# Patient Record
Sex: Female | Born: 1935
Health system: Southern US, Community
[De-identification: ages and names within clinical notes are randomized; demographics above are authoritative.]

## PROBLEM LIST (undated history)

## (undated) DIAGNOSIS — I639 Cerebral infarction, unspecified: Secondary | ICD-10-CM

## (undated) DIAGNOSIS — G609 Hereditary and idiopathic neuropathy, unspecified: Secondary | ICD-10-CM

## (undated) DIAGNOSIS — K219 Gastro-esophageal reflux disease without esophagitis: Secondary | ICD-10-CM

## (undated) DIAGNOSIS — F32A Depression, unspecified: Secondary | ICD-10-CM

## (undated) DIAGNOSIS — M1990 Unspecified osteoarthritis, unspecified site: Secondary | ICD-10-CM

## (undated) DIAGNOSIS — F329 Major depressive disorder, single episode, unspecified: Secondary | ICD-10-CM

## (undated) DIAGNOSIS — G4762 Sleep related leg cramps: Secondary | ICD-10-CM

## (undated) DIAGNOSIS — I471 Supraventricular tachycardia: Secondary | ICD-10-CM

## (undated) DIAGNOSIS — E785 Hyperlipidemia, unspecified: Secondary | ICD-10-CM

## (undated) DIAGNOSIS — E079 Disorder of thyroid, unspecified: Secondary | ICD-10-CM

## (undated) DIAGNOSIS — H9190 Unspecified hearing loss, unspecified ear: Secondary | ICD-10-CM

## (undated) HISTORY — DX: Unspecified hearing loss, unspecified ear: H91.90

## (undated) HISTORY — PX: SALPINGECTOMY: SHX328

## (undated) HISTORY — PX: APPENDECTOMY: SHX54

## (undated) HISTORY — DX: Disorder of thyroid, unspecified: E07.9

## (undated) HISTORY — DX: Hereditary and idiopathic neuropathy, unspecified: G60.9

## (undated) HISTORY — PX: BUNIONECTOMY: SHX129

## (undated) HISTORY — PX: NASAL SINUS SURGERY: SHX719

## (undated) HISTORY — PX: EYE SURGERY: SHX253

## (undated) HISTORY — PX: OOPHORECTOMY: SHX86

## (undated) HISTORY — PX: ROTATOR CUFF REPAIR: SHX139

## (undated) HISTORY — PX: BLADDER SURGERY: SHX569

## (undated) HISTORY — PX: BACK SURGERY: SHX140

## (undated) HISTORY — PX: FOOT SURGERY: SHX648

## (undated) HISTORY — DX: Sleep related leg cramps: G47.62

---

## 1997-10-07 ENCOUNTER — Ambulatory Visit (HOSPITAL_COMMUNITY): Admission: RE | Admit: 1997-10-07 | Discharge: 1997-10-07 | Payer: Self-pay | Admitting: Family Medicine

## 1999-02-21 ENCOUNTER — Ambulatory Visit (HOSPITAL_BASED_OUTPATIENT_CLINIC_OR_DEPARTMENT_OTHER): Admission: RE | Admit: 1999-02-21 | Discharge: 1999-02-21 | Payer: Self-pay | Admitting: Orthopedic Surgery

## 1999-02-28 ENCOUNTER — Encounter: Admission: RE | Admit: 1999-02-28 | Discharge: 1999-04-24 | Payer: Self-pay | Admitting: Orthopedic Surgery

## 2001-10-20 ENCOUNTER — Ambulatory Visit (HOSPITAL_COMMUNITY): Admission: RE | Admit: 2001-10-20 | Discharge: 2001-10-20 | Payer: Self-pay | Admitting: Gastroenterology

## 2001-10-30 ENCOUNTER — Encounter: Payer: Self-pay | Admitting: Family Medicine

## 2001-10-30 ENCOUNTER — Encounter: Admission: RE | Admit: 2001-10-30 | Discharge: 2001-10-30 | Payer: Self-pay | Admitting: Family Medicine

## 2003-04-14 ENCOUNTER — Ambulatory Visit (HOSPITAL_COMMUNITY): Admission: RE | Admit: 2003-04-14 | Discharge: 2003-04-14 | Payer: Self-pay | Admitting: Family Medicine

## 2004-05-04 ENCOUNTER — Encounter: Admission: RE | Admit: 2004-05-04 | Discharge: 2004-06-02 | Payer: Self-pay | Admitting: Orthopedic Surgery

## 2005-01-17 ENCOUNTER — Encounter: Admission: RE | Admit: 2005-01-17 | Discharge: 2005-01-17 | Payer: Self-pay | Admitting: Orthopedic Surgery

## 2005-04-17 ENCOUNTER — Ambulatory Visit (HOSPITAL_COMMUNITY): Admission: RE | Admit: 2005-04-17 | Discharge: 2005-04-17 | Payer: Self-pay | Admitting: Interventional Cardiology

## 2007-04-22 ENCOUNTER — Inpatient Hospital Stay (HOSPITAL_COMMUNITY): Admission: EM | Admit: 2007-04-22 | Discharge: 2007-04-23 | Payer: Self-pay | Admitting: Emergency Medicine

## 2007-07-22 ENCOUNTER — Ambulatory Visit (HOSPITAL_BASED_OUTPATIENT_CLINIC_OR_DEPARTMENT_OTHER): Admission: RE | Admit: 2007-07-22 | Discharge: 2007-07-22 | Payer: Self-pay | Admitting: Orthopedic Surgery

## 2007-08-06 ENCOUNTER — Encounter: Admission: RE | Admit: 2007-08-06 | Discharge: 2007-08-06 | Payer: Self-pay | Admitting: Orthopedic Surgery

## 2009-03-27 ENCOUNTER — Emergency Department (HOSPITAL_COMMUNITY): Admission: EM | Admit: 2009-03-27 | Discharge: 2009-03-27 | Payer: Self-pay | Admitting: Emergency Medicine

## 2009-04-04 ENCOUNTER — Ambulatory Visit (HOSPITAL_BASED_OUTPATIENT_CLINIC_OR_DEPARTMENT_OTHER): Admission: RE | Admit: 2009-04-04 | Discharge: 2009-04-05 | Payer: Self-pay | Admitting: Orthopedic Surgery

## 2009-06-09 ENCOUNTER — Encounter: Admission: RE | Admit: 2009-06-09 | Discharge: 2009-06-09 | Payer: Self-pay | Admitting: Orthopedic Surgery

## 2009-06-17 ENCOUNTER — Ambulatory Visit (HOSPITAL_BASED_OUTPATIENT_CLINIC_OR_DEPARTMENT_OTHER): Admission: RE | Admit: 2009-06-17 | Discharge: 2009-06-18 | Payer: Self-pay | Admitting: Orthopedic Surgery

## 2009-08-17 ENCOUNTER — Emergency Department (HOSPITAL_COMMUNITY): Admission: EM | Admit: 2009-08-17 | Discharge: 2009-08-17 | Payer: Self-pay | Admitting: Emergency Medicine

## 2009-08-31 ENCOUNTER — Encounter: Admission: RE | Admit: 2009-08-31 | Discharge: 2009-08-31 | Payer: Self-pay | Admitting: Family Medicine

## 2009-09-09 ENCOUNTER — Encounter: Admission: RE | Admit: 2009-09-09 | Discharge: 2009-09-27 | Payer: Self-pay | Admitting: Orthopedic Surgery

## 2009-09-27 ENCOUNTER — Encounter: Admission: RE | Admit: 2009-09-27 | Discharge: 2009-09-27 | Payer: Self-pay | Admitting: Internal Medicine

## 2009-09-27 ENCOUNTER — Other Ambulatory Visit: Admission: RE | Admit: 2009-09-27 | Discharge: 2009-09-27 | Payer: Self-pay | Admitting: Diagnostic Radiology

## 2009-10-26 ENCOUNTER — Encounter: Admission: RE | Admit: 2009-10-26 | Discharge: 2009-10-26 | Payer: Self-pay | Admitting: Orthopedic Surgery

## 2010-05-28 LAB — POCT HEMOGLOBIN-HEMACUE: Hemoglobin: 14.5 g/dL (ref 12.0–15.0)

## 2010-05-28 LAB — BASIC METABOLIC PANEL
BUN: 15 mg/dL (ref 6–23)
Creatinine, Ser: 0.63 mg/dL (ref 0.4–1.2)
GFR calc non Af Amer: >60 mL/min (ref >60)
Glucose, Bld: 93 mg/dL (ref 70–99)
Potassium: 4 mEq/L (ref 3.5–5.1)

## 2010-05-28 LAB — PROTIME-INR
INR: 0.96 (ref 0.00–1.49)
Prothrombin Time: 12.7 seconds (ref 11.6–15.2)

## 2010-05-29 LAB — DIFFERENTIAL
Basophils Absolute: 0 10*3/uL (ref 0.0–0.1)
Basophils Relative: 0 % (ref 0–1)
Lymphocytes Relative: 10 % — ABNORMAL LOW (ref 12–46)
Monocytes Absolute: 0.5 10*3/uL (ref 0.1–1.0)
Neutro Abs: 7.6 10*3/uL (ref 1.7–7.7)
Neutrophils Relative %: 84 % — ABNORMAL HIGH (ref 43–77)

## 2010-05-29 LAB — PROTIME-INR
INR: 2.26 — ABNORMAL HIGH (ref 0.00–1.49)
Prothrombin Time: 24.8 seconds — ABNORMAL HIGH (ref 11.6–15.2)

## 2010-05-29 LAB — POCT I-STAT, CHEM 8
Creatinine, Ser: 0.7 mg/dL (ref 0.4–1.2)
Hemoglobin: 13.6 g/dL (ref 12.0–15.0)
Sodium: 139 mEq/L (ref 135–145)
TCO2: 27 mmol/L (ref 0–100)

## 2010-05-29 LAB — POCT CARDIAC MARKERS
CKMB, poc: <1 ng/mL — ABNORMAL LOW (ref 1.0–8.0)
Myoglobin, poc: 63.9 ng/mL (ref 12–200)

## 2010-05-29 LAB — CBC
Hemoglobin: 13.1 g/dL (ref 12.0–15.0)
Platelets: 219 10*3/uL (ref 150–400)
RDW: 13.8 % (ref 11.5–15.5)
WBC: 9 10*3/uL (ref 4.0–10.5)

## 2010-05-31 LAB — BASIC METABOLIC PANEL
CO2: 27 mEq/L (ref 19–32)
Chloride: 108 mEq/L (ref 96–112)
GFR calc Af Amer: >60 mL/min (ref >60)
GFR calc non Af Amer: >60 mL/min (ref >60)
Glucose, Bld: 87 mg/dL (ref 70–99)
Potassium: 3.7 mEq/L (ref 3.5–5.1)
Sodium: 140 mEq/L (ref 135–145)

## 2010-05-31 LAB — PROTIME-INR
INR: 0.92 (ref 0.00–1.49)
Prothrombin Time: 12.3 seconds (ref 11.6–15.2)

## 2010-07-25 NOTE — Op Note (Signed)
NAME:  Ann Rodriguez, Ann Rodriguez                 ACCOUNT NO.:  [id number]   MEDICAL RECORD NO.:  [id number]          PATIENT TYPE:  AMB   LOCATION:  [facility]                         FACILITY:  [facility]   PHYSICIAN:  Marlowe Kays, M.D.  DATE OF BIRTH:  [birth year]   DATE OF PROCEDURE:  07/22/2007  DATE OF DISCHARGE:                               OPERATIVE REPORT   PREOPERATIVE DIAGNOSES.:  1. Torn medial meniscus.  2. Osteoarthritis, left knee.   POSTOPERATIVE DIAGNOSES:  1. Torn medial meniscus.  2. Osteoarthritis, left knee.   OPERATION:  Left knee arthroscopy with (1) partial medial meniscectomy,  (2 ) debridement of medial femoral condyle.   SURGEON:  Marlowe Kays, M.D.   ASSISTANT:  Nurse.   ANESTHESIA:  General.   JUSTIFICATION FOR PROCEDURE:  Painful left knee.  She had an MRI on  March 29, 2007 demonstrating the above diagnoses.  See operative  description below for additional details.   PROCEDURE:  After satisfactory general anesthesia, Ace wrap and knee  support to right lower extremity, pneumatic tourniquet to left lower  extremity, with left leg Esmarched out nonsterilely.  Thigh stabilizer  applied and the leg was prepped from stabilizer to ankle with DuraPrep  and draped in a sterile field.   Superior and medial saline inflow.  First through an anteromedial  portal, lateral compartment and knee joint was evaluated.  She had a  small amount of synovitis which I resected with a 3.5 shaver to allow  better visualization.  Lateral meniscus was intact to probing.  She had  some minimal wear of the lateral femoral condyle.  Looking up at the  suprapatellar area, the MRI had indicated grade 4 chondromalacia of the  patella, and this was confirmed visually.  A representative picture was  taken.  There was nothing that was arthroscopically shaveable.  I then  reversed portals.  Medially, she had a small amount of synovitis once  again which I resected.  She had wear of the medial  femoral condyle  which on debridement actually went down to bare bone, and I smoothed off  the surrounding perimeter surrounding the bare bone.  She also had  extensive tear going from the posterior curve all the way into the  intercondylar area which I resected back to a stable rim with a  combination of baskets and a 3.5 shaver.  Final pictures were taken.  The knee joint was irrigated until clear and all fluid possible removed.  I closed the 2 anterior portals with 4-0 nylon and injected through the  inflow apparatus 20 mL 0.5% Marcaine with adrenaline and 4 mg of  morphine.  I then closed this portal with 4-0 nylon as well.  Betadine  and Adaptic dry sterile dressing were applied.  Tourniquet was released.   She tolerated the procedure well and was taken to the recovery room in  satisfactory condition, with no known complications.           ______________________________  Marlowe Kays, M.D.     JA/MEDQ  D:  07/22/2007  T:  07/22/2007  Job:  [id number]

## 2010-07-25 NOTE — Discharge Summary (Signed)
NAME:  Ann Rodriguez, Ann Rodriguez NO.:  [id number]   MEDICAL RECORD NO.:  [id number]          PATIENT TYPE:  INP   LOCATION:  3704                         FACILITY:  [facility]   PHYSICIAN:  Lyn Records, M.D.   DATE OF BIRTH:  02/16/1929   DATE OF ADMISSION:  04/22/2007  DATE OF DISCHARGE:  04/23/2007                               DISCHARGE SUMMARY   DISCHARGE DIAGNOSES:  1. Premature supraventricular tachycardia, resolved.  2. History of paroxysmal atrial fibrillation, on flecainide.  3. Dyslipidemia.  4. Long-term Coumadin use.   [name] is a 77 year old female with a history of paroxysmal atrial  fibrillation.  On the day of admission she awoke with rapid heart rate  and weakness.  She took an extra flecainide but the arrhythmia  persisted, she ended up being admitted on April 22, 2007, and her  flecainide dose was up titrated.  She remained in the hospital over  night, her electrocardiogram remained normal and she was discharged to  home.   DISCHARGE MEDICATIONS:  1. Flecainide 100 mg twice a day.  2. Atenolol 50 mg 1-1/2 tablet a day.  3. Lovastatin 40 mg a day.  4. Coumadin 5 mg, 1-1/2 tablets a day as prior to admission.   FOLLOWUP APPOINTMENTS:  1. Coumadin checked as well as electrocardiogram in a visit with Tillman Sers, nurse practitioner, on April 28, 2007, at 2:15 p.m.  2. Followup with Dr. Katrinka Blazing on May 12, 2007, at 1:25 p.m.   Remain on a low-sodium, heart-healthy diet, increase activity slowly.  Call for any further palpitations.   LAB STUDIES:  Hemoglobin __________36.6, white count 6.1, platelets  251,000.  Sodium 142, potassium 4.1, BUN 12, creatinine 0.67.  PT 33.2,  INR 3.1.  TSH 6.139, this will need to be addressed with her primary  care physician.      Guy Franco, P.A.      Lyn Records, M.D.  Electronically Signed    LB/MEDQ  D:  04/23/2007  T:  04/24/2007  Job:  [id number]   cc:   Lyn Records, M.D.

## 2010-07-28 NOTE — Op Note (Signed)
   NAME:  REDINA, ZELLER                           ACCOUNT NO.:  [id number]   MEDICAL RECORD NO.:  [id number]                   PATIENT TYPE:  AMB   LOCATION:  ENDO                                 FACILITY:  [facility]   PHYSICIAN:  James L. Malon Kindle., M.D.          DATE OF BIRTH:  March 02, 1929   DATE OF PROCEDURE:  10/20/2001  DATE OF DISCHARGE:                                 OPERATIVE REPORT   PROCEDURE:  Colonoscopy.   MEDICATIONS:  Fentanyl 40 mcg, Versed 6 mg IV.   ENDOSCOPE:  Olympus pediatric colonoscope.   INDICATIONS:  Rectal bleeding in a 77 year old woman.   DESCRIPTION OF PROCEDURE:  The procedure had been explained to the patient  and consent obtained.  With the patient in the left lateral decubitus  position, the Olympus pediatric video colonoscope was inserted, advanced  under direct visualization.  The prep was quite good.  Using abdominal  pressure and position changes, we were able to reach the cecum.  The  ileocecal valve and appendiceal orifice were seen.  The scope was withdrawn  and the cecum, ascending colon, hepatic flexure, transverse colon, splenic  flexure, descending, and sigmoid colon were seen well upon removal.  No  polyps were seen.  No significant diverticular disease.  With the scope in  the rectum, it was retroflexed with the finding of large internal  hemorrhoids.  The scope was withdrawn.  The patient tolerated the procedure  well.   ASSESSMENT:  Rectal bleeding probably due to internal hemorrhoids, no other  significant findings.   PLAN:  Will give hemorrhoid instruction sheet, fiber supplement, and see her  back as needed.  If the bleeding continues, she may well need to consider  hemorrhoid injection therapy, etc.                                                James L. Malon Kindle., M.D.    Waldron Session  D:  10/20/2001  T:  10/22/2001  Job:  [id number]   cc:   Meredith Staggers, M.D.

## 2010-07-28 NOTE — Op Note (Signed)
[facility]  Patient:    Ann Rodriguez                         MRN: [id number] Proc. Date: 02/21/99 Adm. Date:  [id number] Attending:  Cornell Barman                           Operative Report  PREOPERATIVE DIAGNOSIS:  Tear rotator cuff - right shoulder.  POSTOPERATIVE DIAGNOSIS:  Tear rotator cuff - right shoulder.  PROCEDURE:  Neer anterior one-third acromioplasty with open repair rotator cuff - right shoulder.  SURGEON:  Lenard Galloway. Chaney Malling, M.D.  ANESTHESIA:  General.  PROCEDURE:  After satisfactory general anesthesia, the patient is placed on the  operative table in the semi-sitting position.  The right upper extremity and shoulder is then prepped with Duraprep and draped out in the usual manner.   A saber-cut incision made over the anterolateral aspect of the shoulder.  The skin edges were retracted and bleeders were coagulated.  The deltoid fibers are released off the anterolateral aspect of the acromion and the anterior aspect of the acromion.  Subacromial space was opened.  Excellent access to the subacromial space was achieved.  At this point, a power saw was used and a very generous Neer anterior one-third acromioplasty was completed.  Once this was accomplished, the entire cuff could clearly be seen.  There was a tear in the rotator cuff.  The edges were excised and using heavy Ti-Cron sutures, a watertight closure of the  tear was achieved.  Complete closure of the cuff was accomplished.  Throughout he procedure, the shoulder was irrigated with copious amounts of antibiotic solution. The deltoid fibers are then reattached with heavy Vicryl sutures.  A 2-0 Vicryl is used to close the subcutaneous tissue and stainless steel staples used to close the skin.  Sterile dressings were applied and the patient returned to recovery room in excellent condition.  Technically, this procedure went extremely well. Complications  none, drains none. DD:  02/21/99 TD:  02/22/99 Job: [id number]

## 2010-09-14 ENCOUNTER — Other Ambulatory Visit: Payer: Self-pay | Admitting: Internal Medicine

## 2010-09-14 DIAGNOSIS — E042 Nontoxic multinodular goiter: Secondary | ICD-10-CM

## 2010-09-15 ENCOUNTER — Ambulatory Visit
Admission: RE | Admit: 2010-09-15 | Discharge: 2010-09-15 | Disposition: A | Discharge status: Home / Self Care | Payer: Medicare Other | Source: Ambulatory Visit | Attending: Internal Medicine | Admitting: Internal Medicine

## 2010-09-15 DIAGNOSIS — E042 Nontoxic multinodular goiter: Secondary | ICD-10-CM

## 2010-12-01 LAB — CBC
HCT: 43.7
Hemoglobin: 12.6
Hemoglobin: 14.9
MCHC: 34.1
MCV: 93.7
RBC: 3.93
RBC: 4.67
RDW: 14.2
WBC: 6.6

## 2010-12-01 LAB — CK TOTAL AND CKMB (NOT AT ARMC): CK, MB: 2.7

## 2010-12-01 LAB — BASIC METABOLIC PANEL
CO2: 24
Calcium: 9.6
Creatinine, Ser: 0.67
GFR calc non Af Amer: >60
Glucose, Bld: 85
Sodium: 142

## 2010-12-01 LAB — POCT CARDIAC MARKERS
CKMB, poc: 1.3
Myoglobin, poc: 65.7

## 2010-12-01 LAB — PROTIME-INR
INR: 3.1 — ABNORMAL HIGH
Prothrombin Time: 33.2 — ABNORMAL HIGH

## 2010-12-01 LAB — B-NATRIURETIC PEPTIDE (CONVERTED LAB): Pro B Natriuretic peptide (BNP): 383 — ABNORMAL HIGH

## 2010-12-01 LAB — D-DIMER, QUANTITATIVE: D-Dimer, Quant: 0.29

## 2010-12-01 LAB — APTT: aPTT: 43 — ABNORMAL HIGH

## 2010-12-01 LAB — DIFFERENTIAL
Basophils Relative: 0
Eosinophils Absolute: 0
Lymphs Abs: 1.3
Monocytes Absolute: 0.6
Monocytes Relative: 6
Neutro Abs: 7.2

## 2010-12-01 LAB — TROPONIN I: Troponin I: 0.08 — ABNORMAL HIGH

## 2010-12-01 LAB — POCT I-STAT CREATININE
Creatinine, Ser: 0.9
Operator id: 294501

## 2011-01-15 ENCOUNTER — Other Ambulatory Visit: Payer: Self-pay | Admitting: Orthopedic Surgery

## 2011-01-15 DIAGNOSIS — M4800 Spinal stenosis, site unspecified: Secondary | ICD-10-CM

## 2011-01-18 ENCOUNTER — Encounter: Payer: Self-pay | Admitting: Emergency Medicine

## 2011-01-18 ENCOUNTER — Emergency Department (HOSPITAL_COMMUNITY): Payer: Medicare Other

## 2011-01-18 ENCOUNTER — Emergency Department (HOSPITAL_COMMUNITY)
Admission: EM | Admit: 2011-01-18 | Discharge: 2011-01-19 | Disposition: A | Discharge status: Home / Self Care | Payer: Medicare Other | Attending: Emergency Medicine | Admitting: Emergency Medicine

## 2011-01-18 DIAGNOSIS — Z79899 Other long term (current) drug therapy: Secondary | ICD-10-CM | POA: Insufficient documentation

## 2011-01-18 DIAGNOSIS — M25473 Effusion, unspecified ankle: Secondary | ICD-10-CM | POA: Insufficient documentation

## 2011-01-18 DIAGNOSIS — S93409A Sprain of unspecified ligament of unspecified ankle, initial encounter: Secondary | ICD-10-CM

## 2011-01-18 DIAGNOSIS — X500XXA Overexertion from strenuous movement or load, initial encounter: Secondary | ICD-10-CM | POA: Insufficient documentation

## 2011-01-18 DIAGNOSIS — M25579 Pain in unspecified ankle and joints of unspecified foot: Secondary | ICD-10-CM | POA: Insufficient documentation

## 2011-01-18 DIAGNOSIS — Z9889 Other specified postprocedural states: Secondary | ICD-10-CM | POA: Insufficient documentation

## 2011-01-18 DIAGNOSIS — M25476 Effusion, unspecified foot: Secondary | ICD-10-CM | POA: Insufficient documentation

## 2011-01-18 NOTE — ED Provider Notes (Signed)
History     CSN: [id number] Arrival date & time: 01/18/2011  9:59 PM   First MD Initiated Contact with Patient 01/18/11 2245      Chief Complaint  Patient presents with  . Foot Pain     HPI History provided by the patient. The patient presents after complaints of a fall with left ankle injury.  Patient was sitting in chair and as she stood up tripped over her cane.  She had inversion of her left foot and ankle.  She now has pain over lateral aspect of ankle and foot.  She denies numbness or tingling in foot.  She has been able to walk someone foot but with increased pain.  Patient denies lightheadedness, chest pain, shortness of breath, or loss of consciousness.  Patient denies other injury or symptoms.  Patient reports only recently using the cane to help her walk.  She is currently being evaluated for right lower back and hip pains by Dr. Wynelle Cleveland.  Patient has no other significant medical history.   Past Medical History  Diagnosis Date  . Arrhythmia     Past Surgical History  Procedure Date  . Bunionectomy   . Appendectomy   . Back surgery   . Oophorectomy   . Rotator cuff repair   . Foot surgery   . Salpingectomy   . Bladder surgery   . Nasal sinus surgery     Family History  Problem Relation Age of Onset  . Diabetes Mother   . Diabetes Son     History  Substance Use Topics  . Smoking status: Never Smoker   . Smokeless tobacco: Not on file  . Alcohol Use: No    OB History    Grav Para Term Preterm Abortions TAB SAB Ect Mult Living                  Review of Systems  All other systems reviewed and are negative.    Allergies  Review of patient's allergies indicates no known allergies.  Home Medications   Current Outpatient Rx  Name Route Sig Dispense Refill  . ALENDRONATE SODIUM 70 MG PO TABS Oral Take 70 mg by mouth every 7 (seven) days. Take with a full glass of water on an empty stomach.  Taken on Saturday.    . ASPIRIN EC 81 MG PO TBEC Oral Take  81 mg by mouth daily.      . ATORVASTATIN CALCIUM 40 MG PO TABS Oral Take 40 mg by mouth daily.      . OCUVITE PO TABS Oral Take 1 tablet by mouth daily.      Marland Kitchen CALCIUM CARBONATE 600 MG PO TABS Oral Take 1,800 mg by mouth daily.      Marland Kitchen VITAMIN D 1000 UNITS PO TABS Oral Take 3,000 Units by mouth daily.      Marland Kitchen FLECAINIDE ACETATE 100 MG PO TABS Oral Take 100 mg by mouth 2 (two) times daily.      Carma Leaven M PLUS PO TABS Oral Take 1 tablet by mouth daily.      . WARFARIN SODIUM 5 MG PO TABS Oral Take 2.5-5 mg by mouth daily. 0.5 tab daily except 1 tab on Tuesdays      BP 138/84  Pulse 74  Temp(Src) 98.7 F (37.1 C) (Oral)  Resp 20  SpO2 99%  Physical Exam  Nursing note and vitals reviewed. Constitutional: She is oriented to person, place, and time. She appears well-developed and well-nourished.  HENT:  Head: Normocephalic.  Neck: Normal range of motion. Neck supple.  Cardiovascular: Normal rate.   No murmur heard. Pulmonary/Chest: Effort normal and breath sounds normal. She has no wheezes. She has no rales.  Abdominal: Soft.  Musculoskeletal: Normal range of motion.       Full ROM of left ankle. Tenderness with mild swelling over left lateral malleolus and foot.  No pain over proximal 5th metatarsal.  Normal pedal pulses and sensations in foot and toes.  Neurological: She is alert and oriented to person, place, and time.  Skin: Skin is warm.  Psychiatric: She has a normal mood and affect.    ED Course  Procedures (including critical care time)  Labs Reviewed - No data to display Dg Ankle Complete Left  01/18/2011  *RADIOLOGY REPORT*  Clinical Data: Left ankle pain status post fall  LEFT ANKLE COMPLETE - 3+ VIEW  Comparison: None.  Findings: No displaced acute fracture or dislocation identified. No aggressive appearing osseous lesion.  Mild midfoot DJD.  IMPRESSION:  No acute fracture or dislocation. If clinical concern for a fracture persists, recommend a repeat radiograph in 5-10  days to evaluate for interval change or callus formation.  Original Report Authenticated By: Waneta Martins, M.D.     1. Ankle sprain       MDM    Pt discussed with attending provider.  He agrees with plan.     Angus Seller, [place] 01/18/11 [phone]

## 2011-01-18 NOTE — ED Notes (Signed)
Pt states she got up and tripped over her cane and injured her left ankle  Pt has swelling noted with bruising to the outside of her ankle

## 2011-01-18 NOTE — ED Notes (Signed)
Pt back from XRAY 

## 2011-01-18 NOTE — ED Notes (Signed)
Pt reports using her cane this afternoon, and "somehow tripped", bent her left foot, and fell on on her left side. Pt's left ankle swollen, red, and hot to the touch. Pt is able to wiggle toes and raise left leg but cannot rotate her ankle without extreme pain. Pt reports pain is 5/10, but daughter (who is at the bedside) thinks that pain is worse because pt took oxycodone for a pinched nerve in her right hip earlier tonight.

## 2011-01-19 ENCOUNTER — Ambulatory Visit
Admission: RE | Admit: 2011-01-19 | Discharge: 2011-01-19 | Disposition: A | Discharge status: Home / Self Care | Payer: Medicare Other | Source: Ambulatory Visit | Attending: Orthopedic Surgery | Admitting: Orthopedic Surgery

## 2011-01-19 DIAGNOSIS — M4800 Spinal stenosis, site unspecified: Secondary | ICD-10-CM

## 2011-01-19 MED ORDER — DIAZEPAM 5 MG PO TABS
5.0000 mg | ORAL_TABLET | Freq: Once | ORAL | Status: AC
  Administered 2011-01-19: 5 mg via ORAL

## 2011-01-19 MED ORDER — IOHEXOL 180 MG/ML  SOLN
15.0000 mL | Freq: Once | INTRAMUSCULAR | Status: AC | PRN
  Administered 2011-01-19: 15 mL via INTRAVENOUS

## 2011-01-19 NOTE — Patient Instructions (Signed)

## 2011-01-19 NOTE — ED Notes (Signed)
Pt informed of follow up appointment and to apply ice intermittently.

## 2011-01-19 NOTE — ED Provider Notes (Signed)
Medical screening examination/treatment/procedure(s) were conducted as a shared visit with non-physician practitioner(s) and myself.  I personally evaluated the patient during the encounter  Nelia Shi, MD 01/19/11 [phone]

## 2011-01-19 NOTE — Progress Notes (Signed)
Orthopedic Tech Progress Note Patient Details:  Ann Rodriguez 10/01/1928 [id number]       [name] [facility] 01/19/2011, 12:06 AM

## 2011-01-29 NOTE — H&P (Signed)
Ann Rodriguez DOB: [birth year] Married / Language: English / Race: White Female   History of Present Illness The patient is a 77 year old female who presents today for their left knee pain. Symptoms reported today include: pain. The patient feels that they are doing poorly. The patient presents today following MRI.  Problem List/Past Medical Foot pain (729.5) Tight heel cords, acquired (727.81) Acute buttock pain (729.1) Pain, Joint, Ankle/Foot (719.47) Displacement, lumbar disc w/o myelopathy (722.10). 01/26/1985 Epicondylitis, lateral (726.32). 04/22/1985 Lumbago (724.2). 05/12/1985 Lesion, plantar nerve (355.6). 08/06/1985 *RIGHT. 12/09/1985 Exostosis, site NOS (726.91). 07/19/1987 *BILATERAL. 07/25/1987 *BONE SPUR NOS. 08/08/1987 Hallux valgus, acquired (735.0). 08/08/1987 Bunion (727.1). 08/08/1987 Pain in joint, lower leg (719.46). 02/27/1988 Mech cmpl intrn orth dev/implant/graft (996.4). 08/01/1988 *CELLULITIS SHOULDER. 09/10/1988 *LEFT. 09/10/1988 Cervicalgia (723.1). 05/29/1990 Cervicalgia (723.1). 06/04/1991 Lumbago (724.2). 10/22/1991 Hammer toe, other, acquired (735.4). 03/29/1992 Sprain/strain, lumbar region (847.2). 02/10/1993 Sprain/strain, neck (847.0). 02/10/1993 Enthesopathy, hip (726.5). 06/22/1994 Degeneration, lumbar/lumbosacral disc (722.52). 11/29/1995 Displacement, lumbar disc w/o myelopathy (722.10). 01/27/1997 Stenosis, lumbar spine, no neuro claudication (724.02). 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 04/30/2001 Tear, lateral meniscus, knee, current (836.1). 09/10/2001 Osteoarthrosis NOS, lower leg (715.96). 09/10/2001 Stenosis, lumbar spine, no neuro claudication (724.02). 04/06/2003 Stenosis, lumbar spine, no neuro claudication (724.02). 04/27/2003 Stenosis, lumbar spine, no neuro claudication (724.02). 11/12/2003 Degeneration, lumbar/lumbosacral disc (722.52). 02/16/2004 Lumbago (724.2). 03/01/2004 Degeneration,  lumbar/lumbosacral disc (722.52). 03/01/2004 Degeneration, lumbar/lumbosacral disc (722.52). 03/28/2004 Lumbago (724.2). 03/28/2004 Osteoarthrosis NOS, other spec site (715.98). 03/28/2004 Osteoarthrosis NOS, other spec site (715.98). 04/18/2004 Syndrome, postlaminectomy, lumbar (722.83). 04/18/2004 Degeneration, cervical disc (722.4). 01/22/2005 Degeneration, cervical disc (722.4). 02/06/2005 Degeneration, cervical disc (722.4). 02/16/2005 Osteoarthrosis NOS, other spec site (715.98). 02/20/2005 Cervicalgia (723.1). 03/07/2005 Cervicalgia (723.1). 03/22/2005 Pain in thoracic spine (724.1). 04/03/2005 Osteoarthrosis NOS, other spec site (715.98). 07/09/2005 Sprain/strain, rotator cuff (840.4). 10/27/2009 Osteoarthrosis NOS, lower leg (715.96). 10/27/2009 Syndrome, rotator cuff NOS (726.10). 04/08/2010 Pain in joint, pelvis/thigh (719.45). 06/27/2010   Allergies Irregular heart rate VICODIN (Intolerance). 12/16/2003 No Known Drug Allergies. 01/26/2011   Family History Cancer. brother Diabetes Mellitus   Social History Exercise. Exercises daily; does other Alcohol use. current drinker; drinks wine; only occasionally per week No alcohol use Drug/Alcohol Rehab (Currently). no Children. 3 Illicit drug use. no Tobacco use. Never smoker. former smoker; smoke(d) less than 1/2 pack(s) per day Tobacco / smoke exposure. yes outdoors only Marital status. widowed Living situation. live alone Current work status. retired Copy of Drug/Alcohol Rehab (Previously). no Pain Contract. no Number of flights of stairs before winded. 2-3   Medication History(Sherry M Laws; 01/26/2011 9:51 AM) Colcrys (0.6MG  Tablet, 1 Oral two tablets, then one tablet twice a day until symptoms resolve, Taken starting 12/11/2010) Active. Vitamin D ( Oral) Specific dose unknown - Active. MiraLax ( Oral) Specific dose unknown - Active. Fosamax ( Oral) Specific dose unknown -  Active. Flecainide Acetate ( Oral) Specific dose unknown - Active. Calcium 600 ( Oral) Specific dose unknown - Active. Aspirin EC (81MG  Tablet DR, Oral) Active. Metoprolol Succinate ( Oral) Specific dose unknown - Active. Coumadin ( Oral) Specific dose unknown - Active. CeleBREX (200MG  Capsule, Oral) Active.   Pregnancy / Birth History Pregnant. no   Past Surgical History Appendectomy Rotator Cuff Repair - Right. x3 Other Surgery. bladder Ovary Removal - Right Foot Surgery. bilateral Tonsillectomy Low Back Disc Surgery Hysterectomy. complete (non-cancerous) Hemorrhoidectomy Spinal Surgery Sinus Surgery Rotator Cuff Repair. right   Other Problems Hypercholesterolemia Atrial Fibrillation Gastroesophageal Reflux Disease   Objective Muscle testing and sensory exam intact in her  lowers. Her calves are fine, no phlebitis. Circulation is intact. Her hips are negative. The knee, on the left she has pain and swelling in her knee. She has a definite knee effusion. She has definite tear of the meniscus. She has a complex tear posterior horn of the medial meniscus. She also has arthritic changes in her knee. The popliteal space is fine. Her calf is fine. No deep venous thrombosis.  Gait: She ambulates without support.  Neurologic: In regards to the neurological exam, it is intact in her lowers. She does use an ankle brace on the left.  RADIOGRAPHS: I went over the MRI of her left knee, she has a severe complex tear of that medial meniscus.  Assessment & Plan Acute Medial Meniscal Tear (836.0)  Left Knee  Plans Transcription She needs a left knee arthroscopic medial meniscectomy, we will do it as outpatient. Possible complications are rare such as infection. Note, she is on Coumadin. She can come off of that because she did for her myelogram. Dr. Garnette Scheuermann is her cardiologist. She will go on Coumadin the next day after the surgery on her  knee. She will be on antibiotics right before surgery to try to prevent any infections. She also is going to need a walker.   Jacki Cones, MD

## 2011-01-30 ENCOUNTER — Encounter (HOSPITAL_COMMUNITY): Payer: Self-pay | Admitting: Pharmacy Technician

## 2011-02-05 ENCOUNTER — Ambulatory Visit (HOSPITAL_COMMUNITY)
Admission: RE | Admit: 2011-02-05 | Discharge: 2011-02-05 | Disposition: A | Discharge status: Home / Self Care | Payer: Medicare Other | Source: Ambulatory Visit | Attending: Orthopedic Surgery | Admitting: Orthopedic Surgery

## 2011-02-05 ENCOUNTER — Other Ambulatory Visit: Payer: Self-pay

## 2011-02-05 ENCOUNTER — Encounter (HOSPITAL_COMMUNITY): Payer: Self-pay

## 2011-02-05 ENCOUNTER — Encounter (HOSPITAL_COMMUNITY)
Admission: RE | Admit: 2011-02-05 | Discharge: 2011-02-05 | Disposition: A | Discharge status: Home / Self Care | Payer: Medicare Other | Source: Ambulatory Visit | Attending: Orthopedic Surgery | Admitting: Orthopedic Surgery

## 2011-02-05 DIAGNOSIS — Z01812 Encounter for preprocedural laboratory examination: Secondary | ICD-10-CM | POA: Insufficient documentation

## 2011-02-05 DIAGNOSIS — Z01810 Encounter for preprocedural cardiovascular examination: Secondary | ICD-10-CM | POA: Insufficient documentation

## 2011-02-05 DIAGNOSIS — IMO0002 Reserved for concepts with insufficient information to code with codable children: Secondary | ICD-10-CM | POA: Insufficient documentation

## 2011-02-05 DIAGNOSIS — X58XXXA Exposure to other specified factors, initial encounter: Secondary | ICD-10-CM | POA: Insufficient documentation

## 2011-02-05 LAB — CBC
HCT: 42.1 % (ref 36.0–46.0)
Hemoglobin: 13.6 g/dL (ref 12.0–15.0)
MCH: 30.9 pg (ref 26.0–34.0)
MCHC: 32.3 g/dL (ref 30.0–36.0)
MCV: 95.7 fL (ref 78.0–100.0)
Platelets: 272 10*3/uL (ref 150–400)
RBC: 4.4 MIL/uL (ref 3.87–5.11)
RDW: 13.4 % (ref 11.5–15.5)
WBC: 6.4 10*3/uL (ref 4.0–10.5)

## 2011-02-05 LAB — URINALYSIS, ROUTINE W REFLEX MICROSCOPIC
Bilirubin Urine: NEGATIVE
Glucose, UA: NEGATIVE mg/dL
Hgb urine dipstick: NEGATIVE
Ketones, ur: NEGATIVE mg/dL
Nitrite: NEGATIVE
Protein, ur: NEGATIVE mg/dL
Specific Gravity, Urine: 1.028 (ref 1.005–1.030)
Urobilinogen, UA: 0.2 mg/dL (ref 0.0–1.0)
pH: 6 (ref 5.0–8.0)

## 2011-02-05 LAB — COMPREHENSIVE METABOLIC PANEL
ALT: 20 U/L (ref 0–35)
Albumin: 3.8 g/dL (ref 3.5–5.2)
Alkaline Phosphatase: 84 U/L (ref 39–117)
Glucose, Bld: 86 mg/dL (ref 70–99)
Potassium: 4.2 mEq/L (ref 3.5–5.1)
Sodium: 138 mEq/L (ref 135–145)
Total Protein: 6.8 g/dL (ref 6.0–8.3)

## 2011-02-05 LAB — URINE MICROSCOPIC-ADD ON

## 2011-02-05 LAB — DIFFERENTIAL
Eosinophils Absolute: 0.1 10*3/uL (ref 0.0–0.7)
Eosinophils Relative: 1 % (ref 0–5)
Lymphs Abs: 1.2 10*3/uL (ref 0.7–4.0)
Monocytes Absolute: 0.6 10*3/uL (ref 0.1–1.0)
Monocytes Relative: 9 % (ref 3–12)

## 2011-02-05 LAB — PROTIME-INR
INR: 1.2 (ref 0.00–1.49)
Prothrombin Time: 15.5 seconds — ABNORMAL HIGH (ref 11.6–15.2)

## 2011-02-05 LAB — SURGICAL PCR SCREEN
MRSA, PCR: NEGATIVE
Staphylococcus aureus: POSITIVE — AB

## 2011-02-05 LAB — APTT: aPTT: 35 seconds (ref 24–37)

## 2011-02-05 NOTE — Patient Instructions (Signed)
20 EREN PUEBLA  02/05/2011   Your procedure is scheduled on:  Fri. 02/09/2011  Report to [facility] at 0530 AM.  Call this number if you have problems the morning of surgery: [phone]   Remember:   Do not eat food:After Midnight.  May have clear liquids:until Midnight .  Clear liquids include soda, tea, black coffee, apple or grape juice, broth.  Take these medicines the morning of surgery with A SIP OF WATER: Flecainide   Do not wear jewelry, make-up or nail polish.  Do not wear lotions, powders, or perfumes.   Do not shave 48 hours prior to surgery.  Do not bring valuables to the hospital.  Contacts, dentures or bridgework may not be worn into surgery.  Leave suitcase in the car. After surgery it may be brought to your room.  For patients admitted to the hospital, checkout time is 11:00 AM the day of discharge.   Patients discharged the day of surgery will not be allowed to drive home.  Name and phone number of your driver: Debra [phone] office-[facility]  Special Instructions: CHG Shower Use Special Wash: 1/2 bottle night before surgery and 1/2 bottle morning of surgery.   Please read over the following fact sheets that you were given: MRSA Information

## 2011-02-08 NOTE — Anesthesia Preprocedure Evaluation (Addendum)
Anesthesia Evaluation  Patient identified by MRN, date of birth, ID band Patient awake    Reviewed: Allergy & Precautions, H&P , NPO status , Patient's Chart, lab work & pertinent test results  Airway Mallampati: II TM Distance: >3 FB Neck ROM: full    Dental No notable dental hx.    Pulmonary neg pulmonary ROS,  clear to auscultation  Pulmonary exam normal       Cardiovascular Exercise Tolerance: Good neg cardio ROS + dysrhythmias regular Normal afib    Neuro/Psych Negative Neurological ROS  Negative Psych ROS   GI/Hepatic negative GI ROS, Neg liver ROS,   Endo/Other  Negative Endocrine ROS  Renal/GU negative Renal ROS  Genitourinary negative   Musculoskeletal   Abdominal   Peds  Hematology negative hematology ROS (+)   Anesthesia Other Findings   Reproductive/Obstetrics negative OB ROS                          Anesthesia Physical Anesthesia Plan  ASA: II  Anesthesia Plan: General   Post-op Pain Management:    Induction: Intravenous  Airway Management Planned: LMA  Additional Equipment:   Intra-op Plan:   Post-operative Plan:   Informed Consent: I have reviewed the patients History and Physical, chart, labs and discussed the procedure including the risks, benefits and alternatives for the proposed anesthesia with the patient or authorized representative who has indicated his/her understanding and acceptance.   Dental Advisory Given  Plan Discussed with: CRNA  Anesthesia Plan Comments:        Anesthesia Quick Evaluation

## 2011-02-09 ENCOUNTER — Encounter (HOSPITAL_COMMUNITY): Payer: Self-pay | Admitting: Anesthesiology

## 2011-02-09 ENCOUNTER — Encounter (HOSPITAL_COMMUNITY): Payer: Self-pay | Admitting: *Deleted

## 2011-02-09 ENCOUNTER — Ambulatory Visit (HOSPITAL_COMMUNITY)
Admission: RE | Admit: 2011-02-09 | Discharge: 2011-02-09 | Disposition: A | Discharge status: Home / Self Care | Payer: Medicare Other | Source: Ambulatory Visit | Attending: Orthopedic Surgery | Admitting: Orthopedic Surgery

## 2011-02-09 ENCOUNTER — Encounter (HOSPITAL_COMMUNITY)
Admission: RE | Disposition: A | Discharge status: Home / Self Care | Payer: Self-pay | Source: Ambulatory Visit | Attending: Orthopedic Surgery

## 2011-02-09 ENCOUNTER — Ambulatory Visit (HOSPITAL_COMMUNITY): Payer: Medicare Other | Admitting: Anesthesiology

## 2011-02-09 DIAGNOSIS — M659 Unspecified synovitis and tenosynovitis, unspecified site: Secondary | ICD-10-CM | POA: Insufficient documentation

## 2011-02-09 DIAGNOSIS — M179 Osteoarthritis of knee, unspecified: Secondary | ICD-10-CM | POA: Diagnosis present

## 2011-02-09 DIAGNOSIS — M1710 Unilateral primary osteoarthritis, unspecified knee: Secondary | ICD-10-CM | POA: Insufficient documentation

## 2011-02-09 DIAGNOSIS — M23305 Other meniscus derangements, unspecified medial meniscus, unspecified knee: Secondary | ICD-10-CM | POA: Insufficient documentation

## 2011-02-09 HISTORY — PX: KNEE ARTHROSCOPY: SHX127

## 2011-02-09 SURGERY — ARTHROSCOPY, KNEE
Anesthesia: General | Site: Knee | Laterality: Left | Wound class: Clean

## 2011-02-09 MED ORDER — ONDANSETRON HCL 4 MG/2ML IJ SOLN
INTRAMUSCULAR | Status: DC | PRN
  Administered 2011-02-09: 4 mg via INTRAVENOUS

## 2011-02-09 MED ORDER — ACETAMINOPHEN 10 MG/ML IV SOLN
INTRAVENOUS | Status: DC | PRN
  Administered 2011-02-09: 1000 mg via INTRAVENOUS

## 2011-02-09 MED ORDER — PHENYLEPHRINE HCL 10 MG/ML IJ SOLN
INTRAMUSCULAR | Status: DC | PRN
  Administered 2011-02-09: 50 ug via INTRAVENOUS
  Administered 2011-02-09: 100 ug via INTRAVENOUS

## 2011-02-09 MED ORDER — OXYCODONE-ACETAMINOPHEN 10-325 MG PO TABS: 1.0000 | ORAL_TABLET | ORAL | Status: AC | PRN

## 2011-02-09 MED ORDER — FENTANYL CITRATE 0.05 MG/ML IJ SOLN: 25.0000 ug | INTRAMUSCULAR | Status: DC | PRN

## 2011-02-09 MED ORDER — MEPERIDINE HCL 50 MG/ML IJ SOLN: 6.2500 mg | INTRAMUSCULAR | Status: DC | PRN

## 2011-02-09 MED ORDER — LACTATED RINGERS IV SOLN
INTRAVENOUS | Status: DC
  Administered 2011-02-09: 1000 mL via INTRAVENOUS

## 2011-02-09 MED ORDER — BACITRACIN ZINC 500 UNIT/GM EX OINT
TOPICAL_OINTMENT | CUTANEOUS | Status: DC | PRN
  Administered 2011-02-09: 1 via TOPICAL

## 2011-02-09 MED ORDER — BACITRACIN-NEOMYCIN-POLYMYXIN 400-5-5000 EX OINT
TOPICAL_OINTMENT | CUTANEOUS | Status: AC
  Filled 2011-02-09: qty 1

## 2011-02-09 MED ORDER — FENTANYL CITRATE 0.05 MG/ML IJ SOLN
25.0000 ug | INTRAMUSCULAR | Status: DC | PRN
  Administered 2011-02-09 (×3): 25 ug via INTRAVENOUS

## 2011-02-09 MED ORDER — LACTATED RINGERS IV SOLN
INTRAVENOUS | Status: DC | PRN
  Administered 2011-02-09: 07:00:00 via INTRAVENOUS

## 2011-02-09 MED ORDER — DIPHENHYDRAMINE HCL 50 MG/ML IJ SOLN
INTRAMUSCULAR | Status: DC | PRN
  Administered 2011-02-09: 25 mg via INTRAVENOUS

## 2011-02-09 MED ORDER — EPHEDRINE SULFATE 50 MG/ML IJ SOLN
INTRAMUSCULAR | Status: DC | PRN
  Administered 2011-02-09 (×3): 10 mg via INTRAVENOUS

## 2011-02-09 MED ORDER — BUPIVACAINE-EPINEPHRINE PF 0.25-1:200000 % IJ SOLN
INTRAMUSCULAR | Status: AC
  Filled 2011-02-09: qty 30

## 2011-02-09 MED ORDER — OXYCODONE HCL 5 MG PO TABS
ORAL_TABLET | ORAL | Status: AC
  Administered 2011-02-09: 5 mg via ORAL
  Filled 2011-02-09: qty 1

## 2011-02-09 MED ORDER — ACETAMINOPHEN 10 MG/ML IV SOLN
INTRAVENOUS | Status: AC
  Filled 2011-02-09: qty 100

## 2011-02-09 MED ORDER — BUPIVACAINE-EPINEPHRINE 0.25% -1:200000 IJ SOLN
INTRAMUSCULAR | Status: DC | PRN
  Administered 2011-02-09: 30 mL

## 2011-02-09 MED ORDER — FENTANYL CITRATE 0.05 MG/ML IJ SOLN
INTRAMUSCULAR | Status: AC
  Filled 2011-02-09: qty 2

## 2011-02-09 MED ORDER — OXYCODONE-ACETAMINOPHEN 5-325 MG PO TABS
ORAL_TABLET | ORAL | Status: AC
  Administered 2011-02-09: 1 via ORAL
  Filled 2011-02-09: qty 1

## 2011-02-09 MED ORDER — PROPOFOL 10 MG/ML IV EMUL
INTRAVENOUS | Status: DC | PRN
  Administered 2011-02-09: 150 mL via INTRAVENOUS
  Administered 2011-02-09: 50 mL via INTRAVENOUS

## 2011-02-09 MED ORDER — CEFAZOLIN SODIUM 1-5 GM-% IV SOLN
1.0000 g | INTRAVENOUS | Status: AC
  Administered 2011-02-09: 1 g via INTRAVENOUS

## 2011-02-09 MED ORDER — ONDANSETRON HCL 4 MG/2ML IJ SOLN: 4.0000 mg | Freq: Once | INTRAMUSCULAR | Status: DC | PRN

## 2011-02-09 MED ORDER — FENTANYL CITRATE 0.05 MG/ML IJ SOLN
INTRAMUSCULAR | Status: DC | PRN
  Administered 2011-02-09 (×4): 25 ug via INTRAVENOUS

## 2011-02-09 MED ORDER — OXYCODONE HCL 5 MG PO TABS
5.0000 mg | ORAL_TABLET | Freq: Once | ORAL | Status: AC
  Administered 2011-02-09: 5 mg via ORAL

## 2011-02-09 MED ORDER — LACTATED RINGERS IV SOLN: INTRAVENOUS | Status: DC

## 2011-02-09 MED ORDER — CEFAZOLIN SODIUM 1-5 GM-% IV SOLN
INTRAVENOUS | Status: AC
  Filled 2011-02-09: qty 50

## 2011-02-09 MED ORDER — KETAMINE HCL 10 MG/ML IJ SOLN
INTRAMUSCULAR | Status: DC | PRN
  Administered 2011-02-09: 25 mg via INTRAVENOUS
  Administered 2011-02-09: .4 mg via INTRAVENOUS
  Administered 2011-02-09 (×2): .3 mg via INTRAVENOUS

## 2011-02-09 MED ORDER — PROMETHAZINE HCL 25 MG/ML IJ SOLN: 6.2500 mg | INTRAMUSCULAR | Status: DC | PRN

## 2011-02-09 MED ORDER — LACTATED RINGERS IR SOLN
Status: DC | PRN
  Administered 2011-02-09: 3000 mL

## 2011-02-09 SURGICAL SUPPLY — 25 items
BANDAGE ELASTIC 4 VELCRO ST LF (GAUZE/BANDAGES/DRESSINGS) ×2 IMPLANT
BLADE GREAT WHITE 4.2 (BLADE) ×2 IMPLANT
BNDG COHESIVE 6X5 TAN STRL LF (GAUZE/BANDAGES/DRESSINGS) ×2 IMPLANT
CLOTH BEACON ORANGE TIMEOUT ST (SAFETY) ×2 IMPLANT
DRAPE LG THREE QUARTER DISP (DRAPES) ×2 IMPLANT
DRSG PAD ABDOMINAL 8X10 ST (GAUZE/BANDAGES/DRESSINGS) ×4 IMPLANT
DURAPREP 26ML APPLICATOR (WOUND CARE) ×2 IMPLANT
GLOVE BIOGEL PI IND STRL 8.5 (GLOVE) ×1 IMPLANT
GLOVE BIOGEL PI INDICATOR 8.5 (GLOVE) ×1
GLOVE ECLIPSE 8.0 STRL XLNG CF (GLOVE) ×4 IMPLANT
GOWN PREVENTION PLUS LG XLONG (DISPOSABLE) ×4 IMPLANT
GOWN PREVENTION PLUS XLARGE (GOWN DISPOSABLE) ×2 IMPLANT
GOWN STRL NON-REIN LRG LVL3 (GOWN DISPOSABLE) ×2 IMPLANT
GOWN STRL REIN XL XLG (GOWN DISPOSABLE) ×4 IMPLANT
MANIFOLD NEPTUNE II (INSTRUMENTS) ×2 IMPLANT
PACK ARTHROSCOPY WL (CUSTOM PROCEDURE TRAY) ×2 IMPLANT
PACK ICE MAXI GEL EZY WRAP (MISCELLANEOUS) ×2 IMPLANT
PAD MASON LEG HOLDER (PIN) ×2 IMPLANT
SET ARTHROSCOPY TUBING (MISCELLANEOUS) ×1
SET ARTHROSCOPY TUBING LN (MISCELLANEOUS) ×1 IMPLANT
SUT ETHILON 3 0 PS 1 (SUTURE) ×2 IMPLANT
TOWEL OR 17X26 10 PK STRL BLUE (TOWEL DISPOSABLE) ×6 IMPLANT
TUBING CONNECTING 10 (TUBING) ×2 IMPLANT
WAND 90 DEG TURBOVAC W/CORD (SURGICAL WAND) IMPLANT
WRAP KNEE MAXI GEL POST OP (GAUZE/BANDAGES/DRESSINGS) ×2 IMPLANT

## 2011-02-09 NOTE — Interval H&P Note (Signed)
History and Physical Interval Note:  02/09/2011 7:15 AM  Ann Rodriguez  has presented today for surgery, with the diagnosis of Left Knee Meniscal Tear  The various methods of treatment have been discussed with the patient and family. After consideration of risks, benefits and other options for treatment, the patient has consented to  Procedure(s): ARTHROSCOPY KNEE as a surgical intervention .  The patients' history has been reviewed, patient examined, no change in status, stable for surgery.  I have reviewed the patients' chart and labs.  Questions were answered to the patient's satisfaction.     [name]

## 2011-02-09 NOTE — Anesthesia Postprocedure Evaluation (Signed)
  Anesthesia Post-op Note  Patient: Ann Rodriguez  Procedure(s) Performed:  ARTHROSCOPY KNEE - Left Knee Arthroscopy with Menisectomy medial, abrasion chondroplasty medial, and synovectomy, supra patella pouch.  Patient Location: PACU  Anesthesia Type: General  Level of Consciousness: awake and alert   Airway and Oxygen Therapy: Patient Spontanous Breathing  Post-op Pain: mild  Post-op Assessment: Post-op Vital signs reviewed, Patient's Cardiovascular Status Stable, Respiratory Function Stable, Patent Airway and No signs of Nausea or vomiting  Post-op Vital Signs: stable  Complications: No apparent anesthesia complications

## 2011-02-09 NOTE — Brief Op Note (Signed)
02/09/2011  8:21 AM  PATIENT:  Ann Rodriguez  76 y.o. female  PRE-OPERATIVE DIAGNOSIS:  Left Knee Meniscal Tear  POST-OPERATIVE DIAGNOSIS:  Left Knee Meniscal Tear  PROCEDURE:  Procedure(s): ARTHROSCOPY KNEE  SURGEON:  Surgeon(s): Nhung Danko A Nyko Gell    ASSISTANTS: none   ANESTHESIA:   local and general  EBL:   10cc  BLOOD ADMINISTERED:none  DRAINS: none   LOCAL MEDICATIONS USED:  MARCAINE 30CC 0.25% Marcaine with Epinephrine.  SPECIMEN:  No Specimen  DISPOSITION OF SPECIMEN:  N/A  COUNTS:  correct    : .Other Dictation: Dictation Number [id number]  PLAN OF CARE: Discharge to home after PACU  PATIENT DISPOSITION:  PACU - hemodynamically stable.

## 2011-02-09 NOTE — Op Note (Signed)
Ann Rodriguez, Ann Rodriguez NO.:  [id number]  MEDICAL RECORD NO.:  [id number]  LOCATION:  [facility]                         FACILITY:  [facility]  PHYSICIAN:  Georges Lynch. Mariapaula Krist, M.D.DATE OF BIRTH:  03-31-1928  DATE OF PROCEDURE:  02/09/2011 DATE OF DISCHARGE:                              OPERATIVE REPORT   SURGEON:  Georges Lynch. Darrelyn Hillock, M.D.  ASSISTANT:  Nurse.  PREOPERATIVE DIAGNOSES: 1. Severe degenerative arthritis, left knee. 2. Torn medial meniscus, left knee.  POSTOPERATIVE DIAGNOSES: 1. Severe degenerative arthritis, left knee. 2. Torn medial meniscus, left knee.  OPERATION: 1. Diagnostic arthroscopy, left knee. 2. Medial meniscectomy, left knee. 3. Abrasion chondroplasty of the medial femoral condyle, left knee. 4. Synovectomy suprapatellar pouch, left knee.  PROCEDURE IN DETAIL:  Under general anesthesia, routine orthopedic prep and draping of the left lower extremity was carried out.  Appropriate time-out was carried out before any incision was made.  I also marked the appropriate left leg in the holding area.  At this time, after sterile prep and drape, it was carried out with the patient's left lower extremity in the knee holder.  A small punctate incision was made in suprapatellar pouch.  Inflow cannula was inserted and the knee was distended with saline.  At that particular time, another small punctate incision was made in the anterolateral joint.  The arthroscope was entered from lateral approach and a complete diagnostic arthroscopy was carried out.  She had severe chronic synovitis in the suprapatellar pouch.  I inserted the ArthroCare in the medial approach and did a synovectomy.  The patellofemoral joint showed minimal chondromalacia changes.  I went down in the lateral joint.  The lateral joint space was normal.  The medial joint had severe degenerative arthritic changes. She had marked erosion of the cartilage surface of the tibial plateau. The  medial meniscus with a complex tear, I did a partial medial meniscectomy.  I then did an abrasion chondroplasty of the medial femoral condyle.  The cruciates were intact.  There was no other pathology noted.  I thoroughly irrigated out the knee and removed all the fluid, closed all 3 punctate incisions with 3-0 nylon suture.  I injected 30 cc of 0.25% Marcaine with  epinephrine in the knee joint and a sterile Neosporin dressing was applied.  FOLLOWUP CARE: 1. Preop, she had 1 g of IV Ancef.  Postop, she is going to be on     aspirin 325 mg b.i.d. as an anticoagulant. 2. Remove her dressing as instructed. 3. She will be on a walker, partial to full weightbearing as tolerated 4. She will be on Percocet 10/650 one every 4 hours p.r.n. for pain.          ______________________________ Georges Lynch. Darrelyn Hillock, M.D.     RAG/MEDQ  D:  02/09/2011  T:  02/09/2011  Job:  [id number]

## 2011-02-09 NOTE — Transfer of Care (Signed)
Immediate Anesthesia Transfer of Care Note  Patient: Ann Rodriguez  Procedure(s) Performed:  ARTHROSCOPY KNEE - Left Knee Arthroscopy with Menisectomy medial, abrasion chondroplasty medial, and synovectomy, supra patella pouch.  Patient Location: PACU  Anesthesia Type: General  Level of Consciousness: sedated and patient cooperative  Airway & Oxygen Therapy: Patient Spontanous Breathing and Patient connected to face mask oxygen  Post-op Assessment: Report given to PACU RN, Post -op Vital signs reviewed and stable and Patient moving all extremities  Post vital signs: Reviewed and stable  Complications: No apparent anesthesia complications

## 2011-02-09 NOTE — Anesthesia Procedure Notes (Addendum)
Procedure Name: LMA Insertion Date/Time: 02/09/2011 7:33 AM Performed by: Randon Goldsmith CATHERINE PAYNE Pre-anesthesia Checklist: Patient identified, Emergency Drugs available, Suction available and Patient being monitored Patient Re-evaluated:Patient Re-evaluated prior to inductionOxygen Delivery Method: Circle System Utilized Preoxygenation: Pre-oxygenation with 100% oxygen Intubation Type: IV induction Ventilation: Mask ventilation without difficulty LMA: LMA with gastric port inserted LMA Size: 3.0 Number of attempts: 1 Tube secured with: Tape Dental Injury: Teeth and Oropharynx as per pre-operative assessment

## 2011-02-09 NOTE — Preoperative (Signed)
Beta Blockers   Reason not to administer Beta Blockers:Not Applicable, pt not on home BB 

## 2011-02-09 NOTE — H&P (View-Only) (Signed)
Orthopedic Tech Progress Note Patient Details:  Ann Rodriguez 06/24/1928 [id number]       [name] 01/19/2011, 12:06 AM  

## 2011-02-09 NOTE — Progress Notes (Signed)
Ice wrap on Lt knee

## 2011-02-12 ENCOUNTER — Encounter (HOSPITAL_COMMUNITY): Payer: Self-pay | Admitting: Orthopedic Surgery

## 2011-03-01 ENCOUNTER — Other Ambulatory Visit (HOSPITAL_COMMUNITY): Payer: Medicare Other

## 2011-03-02 ENCOUNTER — Ambulatory Visit
Admission: RE | Admit: 2011-03-02 | Discharge: 2011-03-02 | Disposition: A | Discharge status: Home / Self Care | Payer: Medicare Other | Source: Ambulatory Visit | Attending: Orthopedic Surgery | Admitting: Orthopedic Surgery

## 2011-03-02 ENCOUNTER — Other Ambulatory Visit: Payer: Self-pay | Admitting: Orthopedic Surgery

## 2011-03-02 DIAGNOSIS — R0602 Shortness of breath: Secondary | ICD-10-CM

## 2011-03-29 ENCOUNTER — Emergency Department (HOSPITAL_COMMUNITY): Payer: No Typology Code available for payment source

## 2011-03-29 ENCOUNTER — Emergency Department (HOSPITAL_COMMUNITY)
Admission: EM | Admit: 2011-03-29 | Discharge: 2011-03-29 | Disposition: A | Discharge status: Home / Self Care | Payer: No Typology Code available for payment source | Attending: Emergency Medicine | Admitting: Emergency Medicine

## 2011-03-29 ENCOUNTER — Encounter (HOSPITAL_COMMUNITY): Payer: Self-pay

## 2011-03-29 DIAGNOSIS — Z79899 Other long term (current) drug therapy: Secondary | ICD-10-CM | POA: Insufficient documentation

## 2011-03-29 DIAGNOSIS — Z9889 Other specified postprocedural states: Secondary | ICD-10-CM | POA: Insufficient documentation

## 2011-03-29 DIAGNOSIS — G8929 Other chronic pain: Secondary | ICD-10-CM | POA: Insufficient documentation

## 2011-03-29 DIAGNOSIS — H9319 Tinnitus, unspecified ear: Secondary | ICD-10-CM | POA: Insufficient documentation

## 2011-03-29 DIAGNOSIS — H9209 Otalgia, unspecified ear: Secondary | ICD-10-CM | POA: Insufficient documentation

## 2011-03-29 DIAGNOSIS — Z7901 Long term (current) use of anticoagulants: Secondary | ICD-10-CM | POA: Insufficient documentation

## 2011-03-29 DIAGNOSIS — M25569 Pain in unspecified knee: Secondary | ICD-10-CM | POA: Insufficient documentation

## 2011-03-29 DIAGNOSIS — I4891 Unspecified atrial fibrillation: Secondary | ICD-10-CM | POA: Insufficient documentation

## 2011-03-29 LAB — POCT I-STAT, CHEM 8
BUN: 19 mg/dL (ref 6–23)
Calcium, Ion: 1.25 mmol/L (ref 1.12–1.32)
Hemoglobin: 14.3 g/dL (ref 12.0–15.0)
Sodium: 142 mEq/L (ref 135–145)
TCO2: 27 mmol/L (ref 0–100)

## 2011-03-29 LAB — PROTIME-INR
INR: 2.76 — ABNORMAL HIGH (ref 0.00–1.49)
Prothrombin Time: 29.6 seconds — ABNORMAL HIGH (ref 11.6–15.2)

## 2011-03-29 MED ORDER — HYDROCODONE-ACETAMINOPHEN 5-325 MG PO TABS
1.0000 | ORAL_TABLET | Freq: Once | ORAL | Status: AC
  Administered 2011-03-29: 1 via ORAL
  Filled 2011-03-29: qty 1

## 2011-03-29 NOTE — ED Notes (Signed)
Res Ann Rodriguez. Notified of pt pain.
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
## 2011-03-29 NOTE — ED Notes (Signed)
C-collar removed by Resident Wyona Almas.

## 2011-03-29 NOTE — ED Notes (Signed)
Pt returned from radiology.

## 2011-03-29 NOTE — ED Notes (Signed)
See trauma narrator 
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
## 2011-06-15 ENCOUNTER — Emergency Department (HOSPITAL_COMMUNITY)
Admission: EM | Admit: 2011-06-15 | Discharge: 2011-06-15 | Disposition: A | Discharge status: Home / Self Care | Payer: Medicare Other | Attending: Emergency Medicine | Admitting: Emergency Medicine

## 2011-06-15 ENCOUNTER — Encounter (HOSPITAL_COMMUNITY): Payer: Self-pay | Admitting: *Deleted

## 2011-06-15 ENCOUNTER — Other Ambulatory Visit: Payer: Self-pay

## 2011-06-15 DIAGNOSIS — I4891 Unspecified atrial fibrillation: Secondary | ICD-10-CM | POA: Insufficient documentation

## 2011-06-15 DIAGNOSIS — Z7901 Long term (current) use of anticoagulants: Secondary | ICD-10-CM | POA: Insufficient documentation

## 2011-06-15 DIAGNOSIS — I471 Supraventricular tachycardia, unspecified: Secondary | ICD-10-CM

## 2011-06-15 DIAGNOSIS — R42 Dizziness and giddiness: Secondary | ICD-10-CM | POA: Insufficient documentation

## 2011-06-15 DIAGNOSIS — R002 Palpitations: Secondary | ICD-10-CM | POA: Insufficient documentation

## 2011-06-15 DIAGNOSIS — I498 Other specified cardiac arrhythmias: Secondary | ICD-10-CM | POA: Insufficient documentation

## 2011-06-15 DIAGNOSIS — Z87891 Personal history of nicotine dependence: Secondary | ICD-10-CM | POA: Insufficient documentation

## 2011-06-15 NOTE — ED Provider Notes (Signed)
History     CSN: [id number]  Arrival date & time 06/15/11  1203   First MD Initiated Contact with Patient 06/15/11 1224      Chief Complaint  Patient presents with  . Palpitations  . Atrial Fibrillation    (Consider location/radiation/quality/duration/timing/severity/associated sxs/prior treatment) HPI Comments: Patient reports that she has a history of atrial fibrillation and is currently on Coumadin. She reports that she woke up and this morning she began feeling some palpitations and felt lightheaded and faint starting around 8 this morning. She drank some coffee which she reports was really the only thing she was allowed to drink because she is supposed to have cataract surgery at 1 PM today. She reports previously, Dr. Katrinka Blazing her cardiologist told her that she should never go longer than 3 hours with this occurring. By 11 AM, the symptoms were still present and she continued to feel lightheaded although she had no chest pain and proceeded to present to the emergency department. Shortly upon arrival here, the patient's symptoms have completely resolved and on the monitor she is back into a normal sinus rhythm. At present, she denies chest pain, shortness of breath, abdominal pain, back pain. She is no palpitations. She reports she has not had any recent fevers, cold symptoms, cough, diarrhea. She has not had any sick contacts. She denies headache.  Patient is a 78 y.o. female presenting with palpitations and atrial fibrillation. The history is provided by the patient.  Palpitations  Associated symptoms include dizziness. Pertinent negatives include no diaphoresis, no fever, no chest pain, no abdominal pain, no nausea, no vomiting, no back pain and no shortness of breath.  Atrial Fibrillation Pertinent negatives include no chest pain, no abdominal pain and no shortness of breath.    Past Medical History  Diagnosis Date  . Arrhythmia     atiral fibrillation  . Stress fracture of right  foot 12/06/2010    in boot  . A-fib   . Anxiety     Past Surgical History  Procedure Date  . Bunionectomy   . Appendectomy   . Back surgery   . Oophorectomy   . Rotator cuff repair   . Foot surgery   . Salpingectomy   . Bladder surgery   . Nasal sinus surgery   . Knee arthroscopy 02/09/2011    Procedure: ARTHROSCOPY KNEE;  Surgeon: Jacki Cones;  Location: [facility];  Service: Orthopedics;  Laterality: Left;  Left Knee Arthroscopy with Menisectomy medial, abrasion chondroplasty medial, and synovectomy, supra patella pouch.    Family History  Problem Relation Age of Onset  . Diabetes Mother   . Diabetes Son     History  Substance Use Topics  . Smoking status: Former Smoker -- 0.5 packs/day for 5 years    Types: Cigarettes    Quit date: 02/04/1969  . Smokeless tobacco: Not on file  . Alcohol Use: No    OB History    Grav Para Term Preterm Abortions TAB SAB Ect Mult Living                  Review of Systems  Constitutional: Negative.  Negative for fever, chills and diaphoresis.  HENT: Negative for congestion.   Respiratory: Negative for shortness of breath.   Cardiovascular: Positive for palpitations. Negative for chest pain.  Gastrointestinal: Negative for nausea, vomiting and abdominal pain.  Musculoskeletal: Negative for back pain.  Neurological: Positive for dizziness and light-headedness.  All other systems reviewed and are negative.  Allergies  Review of patient's allergies indicates no known allergies.  Home Medications   Current Outpatient Rx  Name Route Sig Dispense Refill  . ATORVASTATIN CALCIUM 40 MG PO TABS Oral Take 40 mg by mouth every evening.     . OCUVITE PO TABS Oral Take 1 tablet by mouth daily.     Marland Kitchen CALCIUM CARBONATE 600 MG PO TABS Oral Take 1,800 mg by mouth daily.     Marland Kitchen VITAMIN D 1000 UNITS PO TABS Oral Take 2,000 Units by mouth 2 (two) times daily.     Marland Kitchen FLECAINIDE ACETATE 100 MG PO TABS Oral Take 100 mg by mouth 2 (two) times  daily.     Marland Kitchen OXYMETAZOLINE HCL 0.05 % NA SOLN Nasal Place into the nose 2 (two) times daily as needed. ALLERGIES     . WARFARIN SODIUM 5 MG PO TABS Oral Take 2.5-5 mg by mouth daily. Takes 5 mg on Tues, Thurs, Sat & 2.5 mg other days of the week.      BP 105/76  Pulse 160  Temp(Src) 97.5 F (36.4 C) (Oral)  Resp 18  SpO2 96%  Physical Exam  Nursing note and vitals reviewed. Constitutional: She appears well-developed and well-nourished.  HENT:  Head: Normocephalic and atraumatic.  Eyes: Pupils are equal, round, and reactive to light. No scleral icterus.  Neck: Neck supple.  Cardiovascular: Normal rate and regular rhythm.   Pulmonary/Chest: Effort normal. No respiratory distress. She has no wheezes.  Abdominal: Soft. She exhibits no distension. There is no tenderness.  Neurological: She is alert.  Skin: Skin is warm and dry.    ED Course  Procedures (including critical care time)  Labs Reviewed - No data to display No results found.   1. SVT (supraventricular tachycardia)     EKG at time 12:05 shows a narrow complex tachycardic rhythm at a rate of 161. No overt P waves are seen. Nonspecific ST depression seen in the inferior and lateral leads. Tachycardic rate is change compared to EKG from date 02/05/2011.     MDM   By 12:30 PM, the patient's rate on monitor is now normal sinus at a rate in the 70s. She reports no chest pain, shortness of breath, lightheadedness or dizziness. Patient reports that she would like to try to make her eye appointment for surgery. I have by telephone contacted her anesthesiologist at the outpatient surgical center who agrees that he feels comfortable performing surgery on her. He spoke to her ophthalmologist who is also in agreement. Patient would like to continue on in that direction. Patient did have an IV placed by nursing here by protocol. They agreed to leave the IV in place which happy to do and the patient is to go straight to the surgical  center for her procedure. Patient is instructed to followup with Dr. Katrinka Blazing next week.  Pt reports she is stil on coumadin adn was not told to stop it by her ophthalmologist prior to procedure.          Gavin Pound. Ayen Viviano, MD 06/15/11 1303

## 2011-06-15 NOTE — Discharge Instructions (Signed)
Supraventricular Tachycardia  Supraventricular tachycardia (SVT) is an abnormal heart rhythm (arrhythmia) that causes the heart to beat very fast (tachycardia). This kind of fast heartbeat originates in the upper chambers of the heart (atria). SVT can cause the heart to beat greater than 100 beats per minute. SVT can have a rapid burst of heartbeats. This can start and stop suddenly without warning and is called nonsustained. SVT can also be sustained, in which the heart beats at a continuous fast rate.   CAUSES   There can be different causes of SVT. Some of these include:   Heart valve problems such as mitral valve prolapse.   An enlarged heart (hypertrophic cardiomyopathy).   Congenital heart problems.   Heart inflammation (pericarditis).   Hyperthyroidism.   Low potassium or magnesium levels.   Caffeine.   Drug use such as cocaine, methamphetamines, or stimulants.   Some over-the-counter medicines such as:   Decongestants.   Diet medicines.   Herbal medicines.  SYMPTOMS   Symptoms of SVT can vary. Symptoms depend on whether the SVT is sustained or nonsustained. You may experience:   No symptoms (asymptomatic).   An awareness of your heart beating rapidly (palpitations).   Shortness of breath.   Chest pain or pressure.  If your blood pressure drops because of the SVT, you may experience:   Fainting or near fainting.   Weakness.   Dizziness.  DIAGNOSIS   Different tests can be performed to diagnose SVT, such as:   An electrocardiogram (EKG). This is a painless test that records the electrical activity of your heart.   Holter monitor. This is a 24 hour recording of your heart rhythm. You will be given a diary. Write down all symptoms that you have and what you were doing at the time you experienced symptoms.   Arrhythmia monitor. This is a small device that your wear for several weeks. It records the heart rhythm when you have symptoms.   Echocardiogram. This is an imaging test to help detect  abnormal heart structure such as congenital abnormalities, heart valve problems, or heart enlargement.   Stress test. This test can help determine if the SVT is related to exercise.   Electrophysiology study (EPS). This is a procedure that evaluates your heart's electrical system and can help your caregiver find the cause of your SVT.  TREATMENT   Treatment of SVT depends on the symptoms, how often it recurs, and whether there are any underlying heart problems.    If symptoms are rare and no other cardiac disease is present, no treatment may be needed.   Blood work may be done to check potassium, magnesium, and thyroid hormone levels to see if they are abnormal. If these levels are abnormal, treatment to correct the problems will occur.  Medicines  Your caregiver may use oral medicines to treat SVT. These medicines are given for long-term control of SVT. Medicines may be used alone or in combination with other treatments. These medicines work to slow nerve impulses in the heart muscle. These medicines can also be used to treat high blood pressure. Some of these medicines may include:   Calcium channel blockers.   Beta blockers.   Digoxin.  Nonsurgical procedures  Nonsurgical techniques may be used if oral medicines do not work. Some examples include:   Cardioversion. This technique uses either drugs or an electrical shock to restore a normal heart rhythm.   Cardioversion drugs may be given through an intravenous (IV) line to help "reset" the   heart rhythm.   In electrical cardioversion, the caregiver shocks your heart to stop its beat for a split second. This helps to reset the heart to a normal rhythm.   Ablation. This procedure is done under mild sedation. High frequency radio wave energy is used to destroy the area of heart tissue responsible for the SVT.  HOME CARE INSTRUCTIONS    Do not smoke.   Only take medicines prescribed by your caregiver. Check with your caregiver before using over-the-counter  medicines.   Check with your caregiver about how much alcohol and caffeine (coffee, tea, colas, or chocolate) you may have.   It is very important to keep all follow-up referrals and appointments in order to properly manage this problem.  SEEK IMMEDIATE MEDICAL CARE IF:   You have dizziness.   You faint or nearly faint.   You have shortness of breath.   You have chest pain or pressure.   You have sudden nausea or vomiting.   You have profuse sweating.   You are concerned about how long your symptoms last.   You are concerned about the frequency of your SVT episodes.  If you have the above symptoms, call your local emergency services (911 in [place] immediately. Do not drive yourself to the hospital.  MAKE SURE YOU:    Understand these instructions.   Will watch your condition.   Will get help right away if you are not doing well or get worse.  Document Released: 02/26/2005 Document Revised: 02/15/2011 Document Reviewed: 06/10/2008  [employer] Patient Information 2012 [employer].

## 2011-06-15 NOTE — ED Notes (Addendum)
Presents c/o palpitations, sob, diaphoretic, and lightheaded. Presents in svt. Hx of afib. A&ox4, in no distress. States last time this happened, pt had to be cardioverted. Sent by Dr. Katrinka Blazing (cardiac) office.

## 2011-07-24 ENCOUNTER — Emergency Department (HOSPITAL_COMMUNITY)
Admission: EM | Admit: 2011-07-24 | Discharge: 2011-07-24 | Disposition: A | Discharge status: Home / Self Care | Payer: Medicare Other | Attending: Emergency Medicine | Admitting: Emergency Medicine

## 2011-07-24 ENCOUNTER — Encounter (HOSPITAL_COMMUNITY): Payer: Self-pay | Admitting: *Deleted

## 2011-07-24 ENCOUNTER — Emergency Department (HOSPITAL_COMMUNITY): Payer: Medicare Other

## 2011-07-24 DIAGNOSIS — R0609 Other forms of dyspnea: Secondary | ICD-10-CM | POA: Insufficient documentation

## 2011-07-24 DIAGNOSIS — Z7901 Long term (current) use of anticoagulants: Secondary | ICD-10-CM | POA: Insufficient documentation

## 2011-07-24 DIAGNOSIS — I471 Supraventricular tachycardia: Secondary | ICD-10-CM

## 2011-07-24 DIAGNOSIS — I498 Other specified cardiac arrhythmias: Secondary | ICD-10-CM | POA: Insufficient documentation

## 2011-07-24 DIAGNOSIS — R0989 Other specified symptoms and signs involving the circulatory and respiratory systems: Secondary | ICD-10-CM | POA: Insufficient documentation

## 2011-07-24 LAB — CARDIAC PANEL(CRET KIN+CKTOT+MB+TROPI)
Relative Index: INVALID (ref 0.0–2.5)
Total CK: 81 U/L (ref 7–177)

## 2011-07-24 LAB — CBC
HCT: 44 % (ref 36.0–46.0)
MCH: 31.5 pg (ref 26.0–34.0)
MCHC: 33.2 g/dL (ref 30.0–36.0)
RDW: 13.4 % (ref 11.5–15.5)

## 2011-07-24 LAB — BASIC METABOLIC PANEL
BUN: 20 mg/dL (ref 6–23)
Calcium: 10.3 mg/dL (ref 8.4–10.5)
Chloride: 104 mEq/L (ref 96–112)
Creatinine, Ser: 0.76 mg/dL (ref 0.50–1.10)
GFR calc Af Amer: 90 mL/min — ABNORMAL LOW (ref >90)
GFR calc non Af Amer: 78 mL/min — ABNORMAL LOW (ref >90)

## 2011-07-24 LAB — DIFFERENTIAL
Basophils Absolute: 0 10*3/uL (ref 0.0–0.1)
Basophils Relative: 0 % (ref 0–1)
Eosinophils Absolute: 0.1 10*3/uL (ref 0.0–0.7)
Monocytes Absolute: 0.8 10*3/uL (ref 0.1–1.0)
Neutro Abs: 6 10*3/uL (ref 1.7–7.7)
Neutrophils Relative %: 72 % (ref 43–77)

## 2011-07-24 MED ORDER — ADENOSINE 6 MG/2ML IV SOLN: 6.0000 mg | Freq: Once | INTRAVENOUS | Status: DC

## 2011-07-24 MED ORDER — METOPROLOL TARTRATE 1 MG/ML IV SOLN
5.0000 mg | Freq: Once | INTRAVENOUS | Status: AC
  Administered 2011-07-24: 5 mg via INTRAVENOUS
  Filled 2011-07-24: qty 5

## 2011-07-24 MED ORDER — ADENOSINE 6 MG/2ML IV SOLN
INTRAVENOUS | Status: AC
  Filled 2011-07-24: qty 10

## 2011-07-24 NOTE — ED Notes (Signed)
Family at bedside. 

## 2011-07-24 NOTE — ED Notes (Signed)
Pt undressed, in gown, on monitor, continuous pulse oximetry and blood pressure cuff; EKG already performed in triage 

## 2011-07-24 NOTE — Discharge Instructions (Signed)
Followup your primary care Dr. or cardiologist °

## 2011-07-24 NOTE — ED Notes (Signed)
Pt placed on zoll pads per RN

## 2011-07-24 NOTE — ED Notes (Signed)
Pt resting and talking to her friend at bedside, no needs at this time

## 2011-07-24 NOTE — ED Provider Notes (Signed)
History     CSN: [id number]  Arrival date & time 07/24/11  8295   First MD Initiated Contact with Patient 07/24/11 [phone]      Chief Complaint  Patient presents with  . Atrial Fibrillation    (Consider location/radiation/quality/duration/timing/severity/associated sxs/prior treatment) HPI... rapid heart rate last night. Patient has a history of supraventricular tachycardia. Her cardiologist Dr. Verdis Prime has suggested the possibility of the need for an ablation.  No chest pain. Slight dyspnea. Exertion makes it worse. No radiation. Quality is moderate.  A level V caveat for urgent need for intervention  Past Medical History  Diagnosis Date  . Arrhythmia     atiral fibrillation  . Stress fracture of right foot 12/06/2010    in boot  . A-fib   . Anxiety     Past Surgical History  Procedure Date  . Bunionectomy   . Appendectomy   . Back surgery   . Oophorectomy   . Rotator cuff repair   . Foot surgery   . Salpingectomy   . Bladder surgery   . Nasal sinus surgery   . Knee arthroscopy 02/09/2011    Procedure: ARTHROSCOPY KNEE;  Surgeon: Jacki Cones;  Location: [facility];  Service: Orthopedics;  Laterality: Left;  Left Knee Arthroscopy with Menisectomy medial, abrasion chondroplasty medial, and synovectomy, supra patella pouch.    Family History  Problem Relation Age of Onset  . Diabetes Mother   . Diabetes Son     History  Substance Use Topics  . Smoking status: Former Smoker -- 0.5 packs/day for 5 years    Types: Cigarettes    Quit date: 02/04/1969  . Smokeless tobacco: Not on file  . Alcohol Use: No    OB History    Grav Para Term Preterm Abortions TAB SAB Ect Mult Living                  Review of Systems  Unable to perform ROS: Other    Allergies  Review of patient's allergies indicates no known allergies.  Home Medications   Current Outpatient Rx  Name Route Sig Dispense Refill  . BESIVANCE OP Ophthalmic Apply to eye. Pt unsure of directions  which eye she uses drops for. MD gave her a sample.    . OCUVITE PO TABS Oral Take 1 tablet by mouth daily.     Marland Kitchen CALCIUM CARBONATE 600 MG PO TABS Oral Take 1,800 mg by mouth daily.     Marland Kitchen VITAMIN D 1000 UNITS PO TABS Oral Take 2,000 Units by mouth 2 (two) times daily.     Marland Kitchen FLECAINIDE ACETATE 100 MG PO TABS Oral Take 100 mg by mouth 2 (two) times daily.     Marland Kitchen OXYMETAZOLINE HCL 0.05 % NA SOLN Nasal Place into the nose 2 (two) times daily as needed. ALLERGIES     . PREDNISOLONE ACETATE 1 % OP SUSP Right Eye Place 1 drop into the right eye 3 (three) times daily.    Marland Kitchen PRESCRIPTION MEDICATION  [place] eye drops- pt is unsure which eye she uses drops for and the directions. MD gave her a sample.    . WARFARIN SODIUM 5 MG PO TABS Oral Take 2.5-5 mg by mouth daily. Takes 5 mg on Sun, Tues, Thurs, Sat & 2.5 mg on Mon, Wed, Fri.      BP 112/76  Pulse 157  Temp(Src) 97.6 F (36.4 C) (Oral)  Resp 16  Ht 5' 6.5" (1.689 m)  SpO2 98%  Physical  Exam  Nursing note and vitals reviewed. Constitutional: She is oriented to person, place, and time. She appears well-developed and well-nourished.  HENT:  Head: Normocephalic and atraumatic.  Eyes: Conjunctivae and EOM are normal. Pupils are equal, round, and reactive to light.  Neck: Normal range of motion. Neck supple.  Cardiovascular: Normal rate.        Tachycardic  Pulmonary/Chest: Effort normal and breath sounds normal.  Abdominal: Soft. Bowel sounds are normal.  Musculoskeletal: Normal range of motion.  Neurological: She is alert and oriented to person, place, and time.  Skin: Skin is warm and dry.  Psychiatric: She has a normal mood and affect.    ED Course  Procedures (including critical care time)  Labs Reviewed  BASIC METABOLIC PANEL - Abnormal; Notable for the following:    GFR calc non Af Amer 78 (*)    GFR calc Af Amer 90 (*)    All other components within normal limits  CARDIAC PANEL(CRET KIN+CKTOT+MB+TROPI) - Abnormal; Notable for  the following:    CK, MB 4.2 (*)    All other components within normal limits  CBC  DIFFERENTIAL    Dg Chest Port 1 View  07/24/2011  *RADIOLOGY REPORT*  Clinical Data: Tachycardia  PORTABLE CHEST - 1 VIEW  Comparison: 03/29/2011  Findings: Cardiomediastinal silhouette is stable.  Mild hyperinflation again noted.  Stable mild levoscoliosis thoracic spine.  No acute infiltrate or pulmonary edema.  Stable old rib fractures.  IMPRESSION: No active disease.  No significant change.  Original Report Authenticated By: Natasha Mead, M.D.   No results found.   No diagnosis found.   Date: 07/24/2011  Rate: 155  Rhythm: supraventricular tachycardia (SVT)  QRS Axis: normal  Intervals: normal  ST/T Wave abnormalities: normal  Conduction Disutrbances:right bundle branch block  Narrative Interpretation:   Old EKG Reviewed: changes noted   MDM  Patient her rhythm is SVT with a rate of 140s to 150s. She is hemodynamically stable.  Heart converted to normal sinus with Lopressor 5 mg IV.  Patient kept in ED for a couple hours to monitor her rhythm.  Stable at discharge        Donnetta Hutching, MD 07/24/11 1528

## 2011-07-24 NOTE — ED Notes (Signed)
PT reports rapid  heart rate last night at approx. 2200. Pt was going to water arobics this AM but felt like her heart was going to fast. Pt drove to ED.

## 2011-07-24 NOTE — ED Notes (Signed)
Adriana Simas, MD notified of pt's heartrate

## 2011-08-27 ENCOUNTER — Encounter (HOSPITAL_COMMUNITY): Payer: Self-pay | Admitting: Pharmacy Technician

## 2011-08-28 ENCOUNTER — Encounter (HOSPITAL_COMMUNITY)
Admission: RE | Admit: 2011-08-28 | Discharge: 2011-08-28 | Disposition: A | Discharge status: Home / Self Care | Payer: Medicare Other | Source: Ambulatory Visit | Attending: Orthopedic Surgery | Admitting: Orthopedic Surgery

## 2011-08-28 ENCOUNTER — Encounter (HOSPITAL_COMMUNITY): Payer: Self-pay

## 2011-08-28 HISTORY — DX: Unspecified osteoarthritis, unspecified site: M19.90

## 2011-08-28 HISTORY — DX: Cerebral infarction, unspecified: I63.9

## 2011-08-28 HISTORY — DX: Gastro-esophageal reflux disease without esophagitis: K21.9

## 2011-08-28 LAB — DIFFERENTIAL
Basophils Absolute: 0 10*3/uL (ref 0.0–0.1)
Basophils Relative: 0 % (ref 0–1)
Eosinophils Absolute: 0.1 10*3/uL (ref 0.0–0.7)
Eosinophils Relative: 1 % (ref 0–5)
Lymphocytes Relative: 20 % (ref 12–46)
Lymphs Abs: 1.1 10*3/uL (ref 0.7–4.0)
Monocytes Absolute: 0.6 10*3/uL (ref 0.1–1.0)
Monocytes Relative: 10 % (ref 3–12)
Neutro Abs: 3.6 10*3/uL (ref 1.7–7.7)
Neutrophils Relative %: 68 % (ref 43–77)

## 2011-08-28 LAB — COMPREHENSIVE METABOLIC PANEL
ALT: 22 U/L (ref 0–35)
AST: 22 U/L (ref 0–37)
Albumin: 3.7 g/dL (ref 3.5–5.2)
Alkaline Phosphatase: 84 U/L (ref 39–117)
BUN: 19 mg/dL (ref 6–23)
CO2: 28 mEq/L (ref 19–32)
Calcium: 9.8 mg/dL (ref 8.4–10.5)
Chloride: 103 mEq/L (ref 96–112)
Creatinine, Ser: 0.72 mg/dL (ref 0.50–1.10)
GFR calc Af Amer: >90 mL/min (ref >90)
GFR calc non Af Amer: 79 mL/min — ABNORMAL LOW (ref >90)
Glucose, Bld: 95 mg/dL (ref 70–99)
Potassium: 4.3 mEq/L (ref 3.5–5.1)
Sodium: 139 mEq/L (ref 135–145)
Total Bilirubin: 0.9 mg/dL (ref 0.3–1.2)
Total Protein: 6.9 g/dL (ref 6.0–8.3)

## 2011-08-28 LAB — URINALYSIS, ROUTINE W REFLEX MICROSCOPIC
Bilirubin Urine: NEGATIVE
Glucose, UA: NEGATIVE mg/dL
Hgb urine dipstick: NEGATIVE
Ketones, ur: NEGATIVE mg/dL
Nitrite: NEGATIVE
Protein, ur: NEGATIVE mg/dL
Specific Gravity, Urine: 1.026 (ref 1.005–1.030)
Urobilinogen, UA: 0.2 mg/dL (ref 0.0–1.0)
pH: 5.5 (ref 5.0–8.0)

## 2011-08-28 LAB — URINE MICROSCOPIC-ADD ON

## 2011-08-28 LAB — CBC
HCT: 44.5 % (ref 36.0–46.0)
Hemoglobin: 14.4 g/dL (ref 12.0–15.0)
MCH: 30.9 pg (ref 26.0–34.0)
MCHC: 32.4 g/dL (ref 30.0–36.0)
MCV: 95.5 fL (ref 78.0–100.0)
Platelets: 289 10*3/uL (ref 150–400)
RBC: 4.66 MIL/uL (ref 3.87–5.11)
RDW: 13.6 % (ref 11.5–15.5)
WBC: 5.3 10*3/uL (ref 4.0–10.5)

## 2011-08-28 LAB — APTT: aPTT: 45 seconds — ABNORMAL HIGH (ref 24–37)

## 2011-08-28 LAB — PROTIME-INR
INR: 2.5 — ABNORMAL HIGH (ref 0.00–1.49)
Prothrombin Time: 27.4 seconds — ABNORMAL HIGH (ref 11.6–15.2)

## 2011-08-28 NOTE — Pre-Procedure Instructions (Signed)
Per Dr Lonn Georgia note- pt to stop coumadin 4-7 days before surgery. Patient states has not been told when to stop but has appt with Dr Darrelyn Hillock Thursday-  INSTRUCTED TO CLARIFY WITH HIM. OFFICE NOTE FROM DR Katrinka Blazing WITH GUIDELINE GIVEN TO patient to have for visit with MD on [facility]

## 2011-08-28 NOTE — Pre-Procedure Instructions (Signed)
Spoke with Candy at [facility] who sent intraoffice email to provider to review abnormal urine and micro from today- she has pre op appt Thursday

## 2011-08-28 NOTE — Patient Instructions (Signed)
[name]  08/28/2011   Your procedure is scheduled on:  09/05/11 surgery [id number]  [facility]  Report to [facility] at     0700  AM.  Call this number if you have problems the morning of surgery: [phone]     Or PST   [phone]  Ann Rodriguez   Remember: ASK DR GIOFFRE ABOUT WHEN HE DESIRES YOU TO STOP COUMADIN AND ASPIRIN on THURSDAY  Do not eat food or drink any fluids :After Midnight. Tuesday NIGHT     Take these medicines the morning of surgery with A SIP OF WATER:  FLECANIDE   Do not wear jewelry, make-up or nail polish.  Do not wear lotions, powders, or perfumes. You may wear deodorant.  Do not shave 48 hours prior to surgery.  Do not bring valuables to the hospital.  Contacts, dentures or bridgework may not be worn into surgery.  Leave suitcase in the car. After surgery it may be brought to your room.  For patients admitted to the hospital, checkout time is 11:00 AM the day of discharge.   Patients discharged the day of surgery will not be allowed to drive home.  Name and phone number of your driver:  friend                                                                    Special Instructions: CHG Shower Use Special Wash: 1/2 bottle night before surgery and 1/2 bottle morning of surgery. REGULAR SOAP FACE AND PRIVATES              LADIES- NO SHAVING 48 HOURS BEFORE USING BETASEPT SOAP.                   Please read over the following fact sheets that you were given: MRSA Information

## 2011-09-04 NOTE — H&P (Signed)
Rodena Goldmann DOB: 06-Jul-1928  Chief Complaint: left knee pain  History of Present Illness The patient is a 78 year old female who comes in today for a preoperative History and Physical. The patient is scheduled for a left total knee arthroplasty to be performed by Dr. Georges Lynch. Darrelyn Hillock, MD at [facility] on 09/05/2011 . Brytnee has been followed for over a year. She had arthroscopic surgery, cortisone injections. She's had Hyalgan injections. She's still having significant pain. She really is at the point now where she can't tolerate her pain. We went back through and showed her the photographs of her arthroscopy, and she has complete wear of her knee joint. She has a severely degenerated arthritic knee. She would like to proceed with a knee replacement. We discussed through all the possible complications such as infection, blood clots, etc. which are extremely rare.    Problem List/Past Medical History Fracture, ribs (807.00) Ribs, multiple fractures (807.09) Degeneration, cervical disc (722.4). 02/16/2005 Cervicalgia (723.1). 03/22/2005 Atrial Fibrillation Gastroesophageal Reflux Disease Hypercholesterolemia Lumbago (724.2) Stenosis, lumbar spine, no neuro claudication (724.02) Enthesopathy, hip (726.5) Tear, lateral meniscus, knee, current (836.1) Hammer toe, other, acquired (735.4) Bunion (727.1) Lesion, plantar nerve (355.6) Epicondylitis, lateral (726.32) Acute Medial Meniscal Tear (836.0)   Allergies No Known Drug Allergies.   Family History Cancer. brother Diabetes Mellitus   Social History Illicit drug use. no Number of flights of stairs before winded. 2-3 Current work status. retired No alcohol use Alcohol use. current drinker; drinks wine; only occasionally per week Pain Contract. no Drug/Alcohol Rehab (Currently). no Tobacco use. Never smoker. former smoker; smoke(d) less than 1/2 pack(s) per day Children. 3 Living  situation. live alone Tobacco / smoke exposure. yes outdoors only Marital status. widowed Exercise. Exercises daily; does other Copy of Drug/Alcohol Rehab (Previously). no   Medication History Percocet (5-325MG  Tablet, 1 (one) Oral every six hours, as needed Active. Vitamin D ( Oral) Specific dose unknown - Active. MiraLax ( Oral) Specific dose unknown - Active. Fosamax ( Oral) Specific dose unknown - Active. Flecainide Acetate ( Oral) Specific dose unknown - Active. Calcium 600 ( Oral) Specific dose unknown - Active. Aspirin EC (81MG  Tablet DR, Oral) Active. Metoprolol Succinate ( Oral) Specific dose unknown - Active. Coumadin ( Oral) Specific dose unknown - Active. Lipitor ( Oral) Specific dose unknown - Active.   Pregnancy / Birth History Pregnant. no   Past Surgical History Sinus Surgery Rotator Cuff Repair - Right. x3 Hemorrhoidectomy Ovary Removal - Right Low Back Disc Surgery Tonsillectomy Hysterectomy. complete (non-cancerous) Appendectomy Other Surgery. bladder Foot Surgery. bilateral   Review of Systems General:Not Present- Chills, Fever, Night Sweats, Appetite Loss, Fatigue, Feeling sick, Weight Gain and Weight Loss. Skin:Not Present- Itching, Rash, Skin Color Changes, Ulcer, Psoriasis and Change in Hair or Nails. HEENT:Not Present- Sensitivity to light, Hearing problems, Nose Bleed and Ringing in the Ears. Neck:Not Present- Swollen Glands and Neck Mass. Respiratory:Present- Shortness of breath with exertion. Not Present- Snoring, Chronic Cough, Bloody sputum and Dyspnea. Cardiovascular:Not Present- Shortness of Breath, Chest Pain, Swelling of Extremities, Leg Cramps and Palpitations. Gastrointestinal:Not Present- Bloody Stool, Heartburn, Abdominal Pain, Vomiting, Nausea and Incontinence of Stool. Female Genitourinary:Not Present- Blood in Urine, Menstrual Irregularities, Frequency, Incontinence and Nocturia. Musculoskeletal:Present-  Muscle Weakness, Joint Stiffness, Joint Swelling, Joint Pain and Back Pain. Not Present- Muscle Pain. Neurological:Not Present- Tingling, Numbness, Burning, Tremor, Headaches and Dizziness. Psychiatric:Not Present- Anxiety, Depression and Memory Loss. Endocrine:Not Present- Cold Intolerance, Heat Intolerance, Excessive hunger and Excessive Thirst. Hematology:Not Present-  Abnormal Bleeding, Anemia, Blood Clots and Easy Bruising. All other systems negative   Vitals Weight: 143 lb Height: 66 in Body Surface Area: 1.74 m Body Mass Index: 23.08 kg/m Pulse: 55 (Regular) Resp.: 16 (Unlabored) BP: 133/77 (Sitting, Left Arm, Standard)    Physical Exam General Mental Status - Alert, cooperative and good historian. General Appearance- pleasant. Not in acute distress. Orientation- Oriented X3. Build & Nutrition- Well nourished and Well developed. Head and Neck Head- normocephalic, atraumatic . Neck Global Assessment- supple. no bruit auscultated on the right and no bruit auscultated on the left. Eye Pupil- Bilateral- Regular and Round. Motion- Bilateral- EOMI. Chest and Lung Exam Auscultation: Breath sounds:- clear at anterior chest wall and - clear at posterior chest wall. Adventitious sounds:- No Adventitious sounds. Cardiovascular Auscultation:Rhythm- Regular rate and rhythm. Heart Sounds- S1 WNL and S2 WNL. Murmurs & Other Heart Sounds:Auscultation of the heart reveals - No Murmurs. Abdomen Palpation/Percussion:Tenderness- Abdomen is non-tender to palpation. Rigidity (guarding)- Abdomen is soft. Auscultation:Auscultation of the abdomen reveals - Bowel sounds normal. Female Genitourinary Not done, not pertinent to present illness Peripheral Vascular Upper Extremity: Palpation:- Pulses bilaterally normal. Lower Extremity: Palpation:- Pulses bilaterally normal. Neurologic Examination of related systems reveals - normal muscle  strength and tone in all extremities. Neurologic evaluation reveals - normal sensation and upper and lower extremity deep tendon reflexes intact bilaterally . Musculoskeletal Left knee tender to palpation medial greater than lateral. Varus deformity. Moderate crepitus. No instability. Mild sofit tissue swelling. No joint effusion. 5 to 120 degrees. Right knee and hips have normal painless ROM.   Assessment & Plan Osteoarthritis, Knee (715.96) Left total knee arthroplasty    Dimitri Ped, PA-C

## 2011-09-05 ENCOUNTER — Encounter (HOSPITAL_COMMUNITY): Payer: Self-pay | Admitting: *Deleted

## 2011-09-05 ENCOUNTER — Inpatient Hospital Stay (HOSPITAL_COMMUNITY)
Admission: RE | Admit: 2011-09-05 | Discharge: 2011-09-08 | DRG: 470 | Disposition: A | Discharge status: Skilled Nursing Facility | Payer: Medicare Other | Source: Ambulatory Visit | Attending: Orthopedic Surgery | Admitting: Orthopedic Surgery

## 2011-09-05 ENCOUNTER — Encounter (HOSPITAL_COMMUNITY): Payer: Self-pay | Admitting: Anesthesiology

## 2011-09-05 ENCOUNTER — Encounter (HOSPITAL_COMMUNITY)
Admission: RE | Disposition: A | Discharge status: Skilled Nursing Facility | Payer: Self-pay | Source: Ambulatory Visit | Attending: Orthopedic Surgery

## 2011-09-05 ENCOUNTER — Inpatient Hospital Stay (HOSPITAL_COMMUNITY): Payer: Medicare Other

## 2011-09-05 ENCOUNTER — Ambulatory Visit (HOSPITAL_COMMUNITY): Payer: Medicare Other | Admitting: Anesthesiology

## 2011-09-05 DIAGNOSIS — I10 Essential (primary) hypertension: Secondary | ICD-10-CM | POA: Diagnosis present

## 2011-09-05 DIAGNOSIS — I4891 Unspecified atrial fibrillation: Secondary | ICD-10-CM | POA: Diagnosis present

## 2011-09-05 DIAGNOSIS — D649 Anemia, unspecified: Secondary | ICD-10-CM | POA: Diagnosis not present

## 2011-09-05 DIAGNOSIS — Z96659 Presence of unspecified artificial knee joint: Secondary | ICD-10-CM

## 2011-09-05 DIAGNOSIS — Z01812 Encounter for preprocedural laboratory examination: Secondary | ICD-10-CM

## 2011-09-05 DIAGNOSIS — K219 Gastro-esophageal reflux disease without esophagitis: Secondary | ICD-10-CM | POA: Diagnosis present

## 2011-09-05 DIAGNOSIS — M1712 Unilateral primary osteoarthritis, left knee: Secondary | ICD-10-CM | POA: Diagnosis present

## 2011-09-05 DIAGNOSIS — Z8673 Personal history of transient ischemic attack (TIA), and cerebral infarction without residual deficits: Secondary | ICD-10-CM

## 2011-09-05 DIAGNOSIS — M1710 Unilateral primary osteoarthritis, unspecified knee: Principal | ICD-10-CM | POA: Diagnosis present

## 2011-09-05 HISTORY — PX: TOTAL KNEE ARTHROPLASTY: SHX125

## 2011-09-05 LAB — TYPE AND SCREEN
ABO/RH(D): O POS
Antibody Screen: NEGATIVE

## 2011-09-05 LAB — ABO/RH: ABO/RH(D): O POS

## 2011-09-05 SURGERY — ARTHROPLASTY, KNEE, TOTAL
Anesthesia: General | Site: Knee | Laterality: Left | Wound class: Clean

## 2011-09-05 MED ORDER — THROMBIN 5000 UNITS EX SOLR
CUTANEOUS | Status: AC
  Filled 2011-09-05: qty 10000

## 2011-09-05 MED ORDER — PHENYLEPHRINE HCL 10 MG/ML IJ SOLN
INTRAMUSCULAR | Status: DC | PRN
  Administered 2011-09-05 (×3): 80 ug via INTRAVENOUS

## 2011-09-05 MED ORDER — BISACODYL 10 MG RE SUPP: 10.0000 mg | Freq: Every day | RECTAL | Status: DC | PRN

## 2011-09-05 MED ORDER — CEFAZOLIN SODIUM 1-5 GM-% IV SOLN
INTRAVENOUS | Status: AC
  Filled 2011-09-05: qty 50

## 2011-09-05 MED ORDER — PROPOFOL 10 MG/ML IV BOLUS
INTRAVENOUS | Status: DC | PRN
  Administered 2011-09-05: 40 mg via INTRAVENOUS
  Administered 2011-09-05: 120 mg via INTRAVENOUS

## 2011-09-05 MED ORDER — METHOCARBAMOL 100 MG/ML IJ SOLN
500.0000 mg | Freq: Four times a day (QID) | INTRAVENOUS | Status: DC | PRN
  Administered 2011-09-05 – 2011-09-06 (×3): 500 mg via INTRAVENOUS
  Filled 2011-09-05 (×3): qty 5

## 2011-09-05 MED ORDER — SODIUM CHLORIDE 0.9 % IR SOLN
Status: DC | PRN
  Administered 2011-09-05: 3000 mL

## 2011-09-05 MED ORDER — LACTATED RINGERS IV SOLN: INTRAVENOUS | Status: DC

## 2011-09-05 MED ORDER — FLECAINIDE ACETATE 50 MG PO TABS
50.0000 mg | ORAL_TABLET | Freq: Every day | ORAL | Status: DC
  Administered 2011-09-05 – 2011-09-07 (×3): 50 mg via ORAL
  Filled 2011-09-05 (×4): qty 1

## 2011-09-05 MED ORDER — ACETAMINOPHEN 10 MG/ML IV SOLN
INTRAVENOUS | Status: AC
  Filled 2011-09-05: qty 100

## 2011-09-05 MED ORDER — WARFARIN - PHARMACIST DOSING INPATIENT: Freq: Every day | Status: DC

## 2011-09-05 MED ORDER — FLEET ENEMA 7-19 GM/118ML RE ENEM: 1.0000 | ENEMA | Freq: Once | RECTAL | Status: AC | PRN

## 2011-09-05 MED ORDER — CEFAZOLIN SODIUM 1-5 GM-% IV SOLN
1.0000 g | INTRAVENOUS | Status: AC
  Administered 2011-09-05: 1 g via INTRAVENOUS

## 2011-09-05 MED ORDER — THROMBIN 5000 UNITS EX SOLR
OROMUCOSAL | Status: DC | PRN
  Administered 2011-09-05: 11:00:00 via TOPICAL

## 2011-09-05 MED ORDER — SODIUM CHLORIDE 0.9 % IR SOLN
Status: DC | PRN
  Administered 2011-09-05: 10:00:00

## 2011-09-05 MED ORDER — ONDANSETRON HCL 4 MG PO TABS: 4.0000 mg | ORAL_TABLET | Freq: Four times a day (QID) | ORAL | Status: DC | PRN

## 2011-09-05 MED ORDER — OXYMETAZOLINE HCL 0.05 % NA SOLN: 1.0000 | Freq: Two times a day (BID) | NASAL | Status: DC | PRN

## 2011-09-05 MED ORDER — BUPIVACAINE LIPOSOME 1.3 % IJ SUSP
INTRAMUSCULAR | Status: DC | PRN
  Administered 2011-09-05: 20 mL

## 2011-09-05 MED ORDER — ACETAMINOPHEN 10 MG/ML IV SOLN: INTRAVENOUS | Status: DC | PRN

## 2011-09-05 MED ORDER — ONDANSETRON HCL 4 MG/2ML IJ SOLN
INTRAMUSCULAR | Status: DC | PRN
  Administered 2011-09-05: 4 mg via INTRAVENOUS

## 2011-09-05 MED ORDER — ROCURONIUM BROMIDE 100 MG/10ML IV SOLN
INTRAVENOUS | Status: DC | PRN
  Administered 2011-09-05: 40 mg via INTRAVENOUS

## 2011-09-05 MED ORDER — METHOCARBAMOL 500 MG PO TABS
500.0000 mg | ORAL_TABLET | Freq: Four times a day (QID) | ORAL | Status: DC | PRN
  Administered 2011-09-06 (×2): 500 mg via ORAL
  Filled 2011-09-05 (×2): qty 1

## 2011-09-05 MED ORDER — ACETAMINOPHEN 650 MG RE SUPP: 650.0000 mg | Freq: Four times a day (QID) | RECTAL | Status: DC | PRN

## 2011-09-05 MED ORDER — HYDROMORPHONE HCL PF 1 MG/ML IJ SOLN
INTRAMUSCULAR | Status: AC
  Filled 2011-09-05: qty 1

## 2011-09-05 MED ORDER — LACTATED RINGERS IV SOLN
INTRAVENOUS | Status: DC | PRN
  Administered 2011-09-05 (×3): via INTRAVENOUS

## 2011-09-05 MED ORDER — PHENOL 1.4 % MT LIQD
1.0000 | OROMUCOSAL | Status: DC | PRN
  Filled 2011-09-05: qty 177

## 2011-09-05 MED ORDER — GLYCOPYRROLATE 0.2 MG/ML IJ SOLN
INTRAMUSCULAR | Status: DC | PRN
  Administered 2011-09-05: 0.2 mg via INTRAVENOUS

## 2011-09-05 MED ORDER — ACETAMINOPHEN 325 MG PO TABS: 650.0000 mg | ORAL_TABLET | Freq: Four times a day (QID) | ORAL | Status: DC | PRN

## 2011-09-05 MED ORDER — MEPERIDINE HCL 50 MG/ML IJ SOLN: 6.2500 mg | INTRAMUSCULAR | Status: DC | PRN

## 2011-09-05 MED ORDER — NEOSTIGMINE METHYLSULFATE 1 MG/ML IJ SOLN
INTRAMUSCULAR | Status: DC | PRN
  Administered 2011-09-05: 2 mg via INTRAVENOUS

## 2011-09-05 MED ORDER — PROMETHAZINE HCL 25 MG/ML IJ SOLN: 6.2500 mg | INTRAMUSCULAR | Status: DC | PRN

## 2011-09-05 MED ORDER — LIDOCAINE HCL (CARDIAC) 20 MG/ML IV SOLN
INTRAVENOUS | Status: DC | PRN
  Administered 2011-09-05: 50 mg via INTRAVENOUS

## 2011-09-05 MED ORDER — SUFENTANIL CITRATE 50 MCG/ML IV SOLN
INTRAVENOUS | Status: DC | PRN
  Administered 2011-09-05: 10 ug via INTRAVENOUS
  Administered 2011-09-05 (×2): 5 ug via INTRAVENOUS
  Administered 2011-09-05: 10 ug via INTRAVENOUS

## 2011-09-05 MED ORDER — ALUM & MAG HYDROXIDE-SIMETH 200-200-20 MG/5ML PO SUSP
30.0000 mL | ORAL | Status: DC | PRN
  Administered 2011-09-06 – 2011-09-07 (×2): 30 mL via ORAL
  Filled 2011-09-05 (×2): qty 30

## 2011-09-05 MED ORDER — ONDANSETRON HCL 4 MG/2ML IJ SOLN
4.0000 mg | Freq: Four times a day (QID) | INTRAMUSCULAR | Status: DC | PRN
  Administered 2011-09-05 – 2011-09-06 (×2): 4 mg via INTRAVENOUS
  Filled 2011-09-05 (×2): qty 2

## 2011-09-05 MED ORDER — FLECAINIDE ACETATE 50 MG PO TABS: 50.0000 mg | ORAL_TABLET | Freq: Three times a day (TID) | ORAL | Status: DC

## 2011-09-05 MED ORDER — HEPARIN SODIUM (PORCINE) 5000 UNIT/ML IJ SOLN
5000.0000 [IU] | Freq: Two times a day (BID) | INTRAMUSCULAR | Status: DC
  Administered 2011-09-05 – 2011-09-08 (×6): 5000 [IU] via SUBCUTANEOUS
  Filled 2011-09-05 (×7): qty 1

## 2011-09-05 MED ORDER — FERROUS SULFATE 325 (65 FE) MG PO TABS
325.0000 mg | ORAL_TABLET | Freq: Three times a day (TID) | ORAL | Status: DC
  Administered 2011-09-05 – 2011-09-08 (×8): 325 mg via ORAL
  Filled 2011-09-05 (×11): qty 1

## 2011-09-05 MED ORDER — HYDROMORPHONE HCL PF 1 MG/ML IJ SOLN
INTRAMUSCULAR | Status: DC | PRN
Start: 2011-09-05 — End: 2011-09-05
  Administered 2011-09-05 (×2): .5 mg via INTRAVENOUS

## 2011-09-05 MED ORDER — WARFARIN SODIUM 5 MG PO TABS
5.0000 mg | ORAL_TABLET | Freq: Once | ORAL | Status: AC
  Administered 2011-09-05: 5 mg via ORAL
  Filled 2011-09-05: qty 1

## 2011-09-05 MED ORDER — POLYETHYLENE GLYCOL 3350 17 G PO PACK: 17.0000 g | PACK | Freq: Every day | ORAL | Status: DC | PRN

## 2011-09-05 MED ORDER — ACETAMINOPHEN 10 MG/ML IV SOLN
INTRAVENOUS | Status: DC | PRN
  Administered 2011-09-05: 1000 mg via INTRAVENOUS

## 2011-09-05 MED ORDER — HYDROCODONE-ACETAMINOPHEN 10-325 MG PO TABS
1.0000 | ORAL_TABLET | ORAL | Status: DC | PRN
  Administered 2011-09-05: 1 via ORAL
  Administered 2011-09-05: 2 via ORAL
  Administered 2011-09-06 (×3): 1 via ORAL
  Administered 2011-09-06: 2 via ORAL
  Administered 2011-09-06: 1 via ORAL
  Administered 2011-09-06: 2 via ORAL
  Administered 2011-09-07 (×3): 1 via ORAL
  Filled 2011-09-05 (×2): qty 1
  Filled 2011-09-05: qty 2
  Filled 2011-09-05 (×2): qty 1
  Filled 2011-09-05 (×2): qty 2
  Filled 2011-09-05 (×5): qty 1

## 2011-09-05 MED ORDER — BUPIVACAINE LIPOSOME 1.3 % IJ SUSP
20.0000 mL | Freq: Once | INTRAMUSCULAR | Status: DC
  Filled 2011-09-05: qty 20

## 2011-09-05 MED ORDER — FLECAINIDE ACETATE 100 MG PO TABS
100.0000 mg | ORAL_TABLET | Freq: Two times a day (BID) | ORAL | Status: DC
  Administered 2011-09-05 – 2011-09-08 (×6): 100 mg via ORAL
  Filled 2011-09-05 (×7): qty 1

## 2011-09-05 MED ORDER — METOCLOPRAMIDE HCL 5 MG/ML IJ SOLN
5.0000 mg | INTRAMUSCULAR | Status: DC | PRN
  Administered 2011-09-05: 5 mg via INTRAVENOUS
  Filled 2011-09-05: qty 2

## 2011-09-05 MED ORDER — CEFAZOLIN SODIUM 1-5 GM-% IV SOLN
1.0000 g | Freq: Four times a day (QID) | INTRAVENOUS | Status: AC
  Administered 2011-09-05 (×2): 1 g via INTRAVENOUS
  Filled 2011-09-05 (×2): qty 50

## 2011-09-05 MED ORDER — HYDROMORPHONE HCL PF 1 MG/ML IJ SOLN
1.0000 mg | INTRAMUSCULAR | Status: DC | PRN
  Administered 2011-09-05: 0.5 mg via INTRAVENOUS
  Administered 2011-09-05 – 2011-09-06 (×6): 1 mg via INTRAVENOUS
  Filled 2011-09-05 (×7): qty 1

## 2011-09-05 MED ORDER — ATORVASTATIN CALCIUM 40 MG PO TABS
40.0000 mg | ORAL_TABLET | Freq: Every day | ORAL | Status: DC
  Administered 2011-09-05 – 2011-09-07 (×3): 40 mg via ORAL
  Filled 2011-09-05 (×4): qty 1

## 2011-09-05 MED ORDER — MENTHOL 3 MG MT LOZG: 1.0000 | LOZENGE | OROMUCOSAL | Status: DC | PRN

## 2011-09-05 MED ORDER — LACTATED RINGERS IV SOLN
INTRAVENOUS | Status: DC
  Administered 2011-09-05 – 2011-09-06 (×4): via INTRAVENOUS

## 2011-09-05 MED ORDER — HYDROMORPHONE HCL PF 1 MG/ML IJ SOLN
0.2500 mg | INTRAMUSCULAR | Status: DC | PRN
  Administered 2011-09-05 (×4): 0.5 mg via INTRAVENOUS

## 2011-09-05 SURGICAL SUPPLY — 61 items
BAG ZIPLOCK 12X15 (MISCELLANEOUS) ×2 IMPLANT
BANDAGE ELASTIC 4 VELCRO ST LF (GAUZE/BANDAGES/DRESSINGS) ×2 IMPLANT
BANDAGE ELASTIC 6 VELCRO ST LF (GAUZE/BANDAGES/DRESSINGS) ×2 IMPLANT
BANDAGE ESMARK 6X9 LF (GAUZE/BANDAGES/DRESSINGS) ×1 IMPLANT
BANDAGE GAUZE ELAST BULKY 4 IN (GAUZE/BANDAGES/DRESSINGS) ×2 IMPLANT
BLADE SAG 18X100X1.27 (BLADE) ×2 IMPLANT
BLADE SAW SGTL 11.0X1.19X90.0M (BLADE) ×2 IMPLANT
BNDG ESMARK 6X9 LF (GAUZE/BANDAGES/DRESSINGS) ×2
BONE CEMENT GENTAMICIN (Cement) ×4 IMPLANT
CEMENT BONE GENTAMICIN 40 (Cement) ×2 IMPLANT
CLOTH BEACON ORANGE TIMEOUT ST (SAFETY) ×2 IMPLANT
CLSR STERI-STRIP ANTIMIC 1/2X4 (GAUZE/BANDAGES/DRESSINGS) ×4 IMPLANT
CUFF TOURN SGL QUICK 34 (TOURNIQUET CUFF) ×1
CUFF TRNQT CYL 34X4X40X1 (TOURNIQUET CUFF) ×1 IMPLANT
DRAPE EXTREMITY T 121X128X90 (DRAPE) ×2 IMPLANT
DRAPE INCISE IOBAN 66X45 STRL (DRAPES) ×2 IMPLANT
DRAPE LG THREE QUARTER DISP (DRAPES) ×2 IMPLANT
DRAPE POUCH INSTRU U-SHP 10X18 (DRAPES) ×2 IMPLANT
DRAPE U-SHAPE 47X51 STRL (DRAPES) ×2 IMPLANT
DRSG ADAPTIC 3X8 NADH LF (GAUZE/BANDAGES/DRESSINGS) ×2 IMPLANT
DRSG PAD ABDOMINAL 8X10 ST (GAUZE/BANDAGES/DRESSINGS) ×2 IMPLANT
DURAPREP 26ML APPLICATOR (WOUND CARE) ×2 IMPLANT
ELECT REM PT RETURN 9FT ADLT (ELECTROSURGICAL) ×2
ELECTRODE REM PT RTRN 9FT ADLT (ELECTROSURGICAL) ×1 IMPLANT
EVACUATOR 1/8 PVC DRAIN (DRAIN) ×2 IMPLANT
FACESHIELD LNG OPTICON STERILE (SAFETY) ×12 IMPLANT
GLOVE BIOGEL PI IND STRL 8 (GLOVE) ×1 IMPLANT
GLOVE BIOGEL PI INDICATOR 8 (GLOVE) ×1
GLOVE ECLIPSE 8.0 STRL XLNG CF (GLOVE) ×4 IMPLANT
GLOVE SURG SS PI 6.5 STRL IVOR (GLOVE) ×4 IMPLANT
GOWN PREVENTION PLUS LG XLONG (DISPOSABLE) ×2 IMPLANT
GOWN STRL REIN XL XLG (GOWN DISPOSABLE) ×4 IMPLANT
HANDPIECE INTERPULSE COAX TIP (DISPOSABLE) ×1
IMMOBILIZER KNEE 20 (SOFTGOODS) ×2
IMMOBILIZER KNEE 20 THIGH 36 (SOFTGOODS) ×1 IMPLANT
KIT BASIN OR (CUSTOM PROCEDURE TRAY) ×2 IMPLANT
MANIFOLD NEPTUNE II (INSTRUMENTS) ×2 IMPLANT
NEEDLE HYPO 22GX1.5 SAFETY (NEEDLE) ×2 IMPLANT
NS IRRIG 1000ML POUR BTL (IV SOLUTION) ×2 IMPLANT
PACK TOTAL JOINT (CUSTOM PROCEDURE TRAY) ×2 IMPLANT
POSITIONER SURGICAL ARM (MISCELLANEOUS) ×2 IMPLANT
SET HNDPC FAN SPRY TIP SCT (DISPOSABLE) ×1 IMPLANT
SET PAD KNEE POSITIONER (MISCELLANEOUS) ×2 IMPLANT
SPONGE GAUZE 4X4 12PLY (GAUZE/BANDAGES/DRESSINGS) ×2 IMPLANT
SPONGE LAP 18X18 X RAY DECT (DISPOSABLE) ×2 IMPLANT
SPONGE SURGIFOAM ABS GEL 100 (HEMOSTASIS) ×2 IMPLANT
STAPLER VISISTAT 35W (STAPLE) IMPLANT
SUCTION FRAZIER 12FR DISP (SUCTIONS) ×2 IMPLANT
SUT BONE WAX W31G (SUTURE) ×2 IMPLANT
SUT VIC AB 0 CT1 27 (SUTURE)
SUT VIC AB 0 CT1 27XBRD ANTBC (SUTURE) IMPLANT
SUT VIC AB 1 CT1 27 (SUTURE) ×4
SUT VIC AB 1 CT1 27XBRD ANTBC (SUTURE) ×4 IMPLANT
SUT VIC AB 2-0 CT1 27 (SUTURE) ×3
SUT VIC AB 2-0 CT1 TAPERPNT 27 (SUTURE) ×3 IMPLANT
SYR 20CC LL (SYRINGE) ×2 IMPLANT
TOWEL OR 17X26 10 PK STRL BLUE (TOWEL DISPOSABLE) ×4 IMPLANT
TOWER CARTRIDGE SMART MIX (DISPOSABLE) ×2 IMPLANT
TRAY FOLEY CATH 14FRSI W/METER (CATHETERS) ×2 IMPLANT
WATER STERILE IRR 1500ML POUR (IV SOLUTION) ×2 IMPLANT
WRAP KNEE MAXI GEL POST OP (GAUZE/BANDAGES/DRESSINGS) ×2 IMPLANT

## 2011-09-05 NOTE — Anesthesia Postprocedure Evaluation (Signed)
  Anesthesia Post-op Note  Patient: Ann Rodriguez  Procedure(s) Performed: Procedure(s) (LRB): TOTAL KNEE ARTHROPLASTY (Left)  Patient Location: PACU  Anesthesia Type: General  Level of Consciousness: awake and alert   Airway and Oxygen Therapy: Patient Spontanous Breathing  Post-op Pain: mild  Post-op Assessment: Post-op Vital signs reviewed, Patient's Cardiovascular Status Stable, Respiratory Function Stable, Patent Airway and No signs of Nausea or vomiting  Post-op Vital Signs: stable  Complications: No apparent anesthesia complications

## 2011-09-05 NOTE — Anesthesia Preprocedure Evaluation (Addendum)
Anesthesia Evaluation  Patient identified by MRN, date of birth, ID band Patient awake    Reviewed: Allergy & Precautions, H&P , NPO status , Patient's Chart, lab work & pertinent test results  Airway Mallampati: II TM Distance: >3 FB Neck ROM: full    Dental No notable dental hx.    Pulmonary neg pulmonary ROS,  breath sounds clear to auscultation  Pulmonary exam normal       Cardiovascular Exercise Tolerance: Good hypertension, Pt. on medications negative cardio ROS  + dysrhythmias Atrial Fibrillation Rhythm:Irregular Rate:Normal  afib    Neuro/Psych CVA (2003 diagnosed by MRI lacunar) negative neurological ROS  negative psych ROS   GI/Hepatic negative GI ROS, Neg liver ROS, GERD-  Medicated and Controlled,  Endo/Other  negative endocrine ROS  Renal/GU negative Renal ROS  negative genitourinary   Musculoskeletal   Abdominal   Peds  Hematology negative hematology ROS (+)   Anesthesia Other Findings   Reproductive/Obstetrics negative OB ROS                         Anesthesia Physical  Anesthesia Plan  ASA: III  Anesthesia Plan: General   Post-op Pain Management:    Induction: Intravenous  Airway Management Planned: LMA and Oral ETT  Additional Equipment:   Intra-op Plan:   Post-operative Plan: Extubation in OR  Informed Consent: I have reviewed the patients History and Physical, chart, labs and discussed the procedure including the risks, benefits and alternatives for the proposed anesthesia with the patient or authorized representative who has indicated his/her understanding and acceptance.   Dental Advisory Given  Plan Discussed with: CRNA  Anesthesia Plan Comments:        Anesthesia Quick Evaluation

## 2011-09-05 NOTE — Transfer of Care (Signed)
Immediate Anesthesia Transfer of Care Note  Patient: Ann Rodriguez  Procedure(s) Performed: Procedure(s) (LRB): TOTAL KNEE ARTHROPLASTY (Left)  Patient Location: PACU  Anesthesia Type: General  Level of Consciousness: awake, sedated, patient cooperative and responds to stimulation  Airway & Oxygen Therapy: Patient Spontanous Breathing and Patient connected to face mask oxygen  Post-op Assessment: Report given to PACU RN, Post -op Vital signs reviewed and stable and Patient moving all extremities X 4  Post vital signs: stable  Complications: No apparent anesthesia complications

## 2011-09-05 NOTE — Progress Notes (Signed)
Teach back pre-op 

## 2011-09-05 NOTE — Brief Op Note (Signed)
09/05/2011  11:25 AM  PATIENT:  Rodena Goldmann  78 y.o. female  PRE-OPERATIVE DIAGNOSIS:  Osteoarthritis of the Left Knee  POST-OPERATIVE DIAGNOSIS:  Osteoarthritis of the Left Knee  PROCEDURE:  Procedure(s) (LRB): TOTAL KNEE ARTHROPLASTY (Left)  SURGEON:  Surgeon(s) and Role:    * Jacki Cones, MD - Primary  PHYSICIAN ASSISTANT:Amber Constable PA    ANESTHESIA:   general  EBL:  Total I/O In: 1000 [I.V.:1000] Out: 450 [Urine:400; Blood:50]  BLOOD ADMINISTERED:none  DRAINS: (1) Hemovact drain(s) in the Left with  Suction Open   LOCAL MEDICATIONS USED:  BUPIVICAINE 20cc mixed with 20cc Normal Saline   SPECIMEN:  No Specimen  DISPOSITION OF SPECIMEN:  N/A  COUNTS:  YES  TOURNIQUET:   Total Tourniquet Time Documented: Thigh (Left) - 90 minutes  DICTATION: .Other Dictation: Dictation Number [id number]  PLAN OF CARE: Admit to inpatient   PATIENT DISPOSITION:  Stable in OR   Delay start of Pharmacological VTE agent (>24hrs) due to surgical blood loss or risk of bleeding: yes

## 2011-09-05 NOTE — Progress Notes (Signed)
ANTICOAGULATION CONSULT NOTE - Initial Consult  Pharmacy Consult for Warfarin Indication: Atrial Fibrillation;  S/P L TKA  No Known Allergies  Patient Measurements: Height: 5\' 6"  (167.6 cm) Weight: 143 lb (64.864 kg) IBW/kg (Calculated) : 59.3    Vital Signs: Temp: 97.8 F (36.6 C) (06/26 1313) Temp src: Oral (06/26 1310) BP: 102/56 mmHg (06/26 1313) Pulse Rate: 58  (06/26 1313)  Labs:  Basename 09/05/11 0730  HGB --  HCT --  PLT --  APTT --  LABPROT 12.3  INR 0.90  HEPARINUNFRC --  CREATININE --  CKTOTAL --  CKMB --  TROPONINI --    Estimated Creatinine Clearance: 52.5 ml/min (by C-G formula based on Cr of 0.72).   Medical History: Past Medical History  Diagnosis Date  . Stress fracture of right foot 12/06/2010    in boot  . Anxiety   . Stroke 1/13    CT- age related atrophy with mild small vessel ischemic change- no defecits  . GERD (gastroesophageal reflux disease)   . Arthritis   . Arrhythmia     atrial fibrillation/OV, EKG with CLEARANCE DR Katrinka Blazing 6/13 on chart, eccho 5/11 on chart, last stress test 2002, CHEST X RAY 1/13 EPIC  . A-fib   . Hypertension     per Dr Katrinka Blazing OV note,/ ESSENTIAL  . Warfarin anticoagulation     Medications:  Scheduled:    . atorvastatin  40 mg Oral QHS  .  ceFAZolin (ANCEF) IV  1 g Intravenous 60 min Pre-Op  .  ceFAZolin (ANCEF) IV  1 g Intravenous Q6H  . ferrous sulfate  325 mg Oral TID PC  . flecainide  50-100 mg Oral TID  . heparin  5,000 Units Subcutaneous Q12H  . HYDROmorphone      . HYDROmorphone      . DISCONTD: bupivacaine liposome  20 mL Infiltration Once   Infusions:    . lactated ringers    . DISCONTD: lactated ringers    . DISCONTD: lactated ringers    . DISCONTD: lactated ringers     PRN: acetaminophen, acetaminophen, alum & mag hydroxide-simeth, bisacodyl, HYDROcodone-acetaminophen, HYDROmorphone (DILAUDID) injection, menthol-cetylpyridinium, methocarbamol (ROBAXIN) IV, methocarbamol, ondansetron  (ZOFRAN) IV, ondansetron, oxymetazoline, phenol, polyethylene glycol, sodium phosphate, DISCONTD: bupivacaine liposome, DISCONTD:  HYDROmorphone (DILAUDID) injection, DISCONTD: meperidine (DEMEROL) injection DISCONTD: polymyxin / bacitracin (DOUBLE ANTIBIOTIC) irrigation, DISCONTD: promethazine, DISCONTD: sodium chloride irrigation, DISCONTD: Surgifoam 1 Gm with Thrombin 5,000 units (5 ml) topical solution  Assessment:  78 y/o F on chronic warfarin for atrial fibrillation, underwent L TKA on 09/05/11.  Warfarin was interrupted prior to surgery and resumed postoperatively with pharmacy dosing.  Patient reported warfarin dosage PTA as 2.5mg  Sun,M,W,F;  5mg  Tues,Thurs,Sat.   Goal of Therapy:  INR 2-3    Plan:  1. Warfarin 5mg  PO x 1 tonight. 2. Heparin 5000 units SQ q12h as ordered by ortho. 3. Follow PT/INR daily.  Tavaughn Silguero, Ky Barban, PharmD, BCPS Pager [phone] 09/05/2011,1:37 PM

## 2011-09-05 NOTE — Addendum Note (Signed)
Addendum  created 09/05/11 1314 by Illene Silver, CRNA   Modules edited:Anesthesia Events, Anesthesia Flowsheet

## 2011-09-05 NOTE — Addendum Note (Signed)
Addendum  created 09/05/11 1315 by Illene Silver, CRNA   Modules edited:Anesthesia Events, Anesthesia Flowsheet

## 2011-09-06 LAB — CBC
MCH: 30.9 pg (ref 26.0–34.0)
MCHC: 32.4 g/dL (ref 30.0–36.0)
Platelets: 202 10*3/uL (ref 150–400)
RBC: 3.2 MIL/uL — ABNORMAL LOW (ref 3.87–5.11)

## 2011-09-06 LAB — BASIC METABOLIC PANEL
CO2: 28 mEq/L (ref 19–32)
Calcium: 8.7 mg/dL (ref 8.4–10.5)
GFR calc non Af Amer: 86 mL/min — ABNORMAL LOW (ref >90)
Sodium: 136 mEq/L (ref 135–145)

## 2011-09-06 LAB — PROTIME-INR
INR: 1.09 (ref 0.00–1.49)
Prothrombin Time: 14.3 seconds (ref 11.6–15.2)

## 2011-09-06 MED ORDER — WARFARIN SODIUM 5 MG PO TABS
5.0000 mg | ORAL_TABLET | Freq: Once | ORAL | Status: AC
  Administered 2011-09-06: 5 mg via ORAL
  Filled 2011-09-06: qty 1

## 2011-09-06 NOTE — Progress Notes (Signed)
Clinical Social Work Department CLINICAL SOCIAL WORK PLACEMENT NOTE 09/06/2011  Patient:  Ann Rodriguez, Ann Rodriguez  Account Number:  [id number] Admit date:  09/05/2011  Clinical Social Worker:  Cori Razor, LCSW  Date/time:  09/06/2011 03:10 PM  Clinical Social Work is seeking post-discharge placement for this patient at the following level of care:   SKILLED NURSING   (*CSW will update this form in Epic as items are completed)     Patient/family provided with [facility] Department of Clinical Social Work's list of facilities offering this level of care within the geographic area requested by the patient (or if unable, by the patient's family).    Patient/family informed of their freedom to choose among providers that offer the needed level of care, that participate in Medicare, Medicaid or managed care program needed by the patient, have an available bed and are willing to accept the patient.    Patient/family informed of [facility]' ownership interest in [facility], as well as of the fact that they are under no obligation to receive care at this facility.  PASARR submitted to EDS on 09/06/2011 PASARR number received from EDS on   FL2 transmitted to all facilities in geographic area requested by pt/family on  09/06/2011 FL2 transmitted to all facilities within larger geographic area on   Patient informed that his/her managed care company has contracts with or will negotiate with  certain facilities, including the following:     Patient/family informed of bed offers received:  09/06/2011 Patient chooses bed at [facility] Physician recommends and patient chooses bed at    Patient to be transferred to  on   Patient to be transferred to facility by   The following physician request were entered in Epic:   Additional Comments:  Cori Razor LCSW [phone]

## 2011-09-06 NOTE — Progress Notes (Signed)
Subjective: 1 Day Post-Op Procedure(s) (LRB): TOTAL KNEE ARTHROPLASTY (Left) Patient reports pain as moderate.   Patient seen in rounds without Dr. Darrelyn Hillock. Patient is well, and has had no acute complaints or problems other than pain in her left knee as expected. She reports that her appetite is poor but she was trying to eat some breakfast during rounds. She till feels a little drowsy and "loopy" from yesterday. No chest pain or shortness of breath. We will start therapy today. Plan may need to go to SNF after discharge.  Objective: Vital signs in last 24 hours: Temp:  [97.2 F (36.2 C)-99.4 F (37.4 C)] 99.4 F (37.4 C) (06/27 0630) Pulse Rate:  [52-66] 65  (06/27 0630) Resp:  [12-17] 14  (06/27 0630) BP: (91-120)/(47-66) 102/60 mmHg (06/27 0630) SpO2:  [95 %-100 %] 97 % (06/27 0630) Weight:  [64.864 kg (143 lb)] 64.864 kg (143 lb) (06/26 1310)  Intake/Output from previous day:  Intake/Output Summary (Last 24 hours) at 09/06/11 0742 Last data filed at 09/06/11 0630  Gross per 24 hour  Intake   3985 ml  Output   3480 ml  Net    505 ml     Labs:  Basename 09/06/11 0437  HGB 9.9*    Basename 09/06/11 0437  WBC 8.5  RBC 3.20*  HCT 30.6*  PLT 202    Basename 09/06/11 0437  NA 136  K 3.9  CL 103  CO2 28  BUN 7  CREATININE 0.55  GLUCOSE 112*  CALCIUM 8.7    Basename 09/06/11 0437 09/05/11 0730  LABPT -- --  INR 1.09 0.90    EXAM General - Patient is Alert and Oriented Extremity - Neurologically intact Neurovascular intact Dorsiflexion/Plantar flexion intact Dressing - dressing C/D/I Motor Function - intact, moving foot and toes well on exam.  Hemovac pulled without difficulty.  Past Medical History  Diagnosis Date  . Stress fracture of right foot 12/06/2010    in boot  . Anxiety   . Stroke 1/13    CT- age related atrophy with mild small vessel ischemic change- no defecits  . GERD (gastroesophageal reflux disease)   . Arthritis   . Arrhythmia    atrial fibrillation/OV, EKG with CLEARANCE DR Katrinka Blazing 6/13 on chart, eccho 5/11 on chart, last stress test 2002, CHEST X RAY 1/13 EPIC  . A-fib   . Hypertension     per Dr Katrinka Blazing OV note,/ ESSENTIAL  . Warfarin anticoagulation     Assessment/Plan: 1 Day Post-Op Procedure(s) (LRB): TOTAL KNEE ARTHROPLASTY (Left) Active Problems:  Osteoarthritis of left knee   Advance diet as tolerated Up with therapy  DVT Prophylaxis - Coumadin Weight-Bearing as tolerated to left leg Will recheck H&H in the morning. May need blood transfusion if too low and symptomatic   Joye Wesenberg LAUREN 09/06/2011, 7:42 AM

## 2011-09-06 NOTE — Op Note (Signed)
NAMESHOLONDA, JOBST NO.:  [id number]  MEDICAL RECORD NO.:  [id number]  LOCATION:  1607                         FACILITY:  [facility]  PHYSICIAN:  Georges Lynch. Maalik Pinn, M.D.DATE OF BIRTH:  1928-12-24  DATE OF PROCEDURE:  09/05/2011 DATE OF DISCHARGE:                              OPERATIVE REPORT   SURGEON:  Georges Lynch. Darrelyn Hillock, MD  ASSISTANT:  Dimitri Ped, PA  OPERATION:  Left total knee arthroplasty utilizing DePuy system.  I utilized a size 3 left femoral component posterior cruciate sacrificing tight, tibial tray was a size 3.  The insert was a size 3, 10 mm thickness.  The patella was a size 38 with 3 pegs.  All 3 components were cemented and gentamicin was used in the cement.  PROCEDURE:  Under general anesthesia as I mentioned, routine orthopedic prep and draping of the left lower extremity was carried out.  The leg was placed in a De Mayo knee holder and also I Esmarch'ed the leg and elevated tourniquet to 300 mmHg.  At this time, with the knee flexed, an incision was made in the midline in the usual fashion in the left knee, 2 flaps were created.  I then carried out a median parapatellar incision reflected the patella laterally and then did medial lateral meniscectomies and excised the anterior and posterior cruciate ligaments.  Note, she had severe wear of the total joint itself with bone on bone.  The initial drill hole was placed in the intercondylar notch, and I then measured 11 mm thickness to remove the distal femur 11 mm thickness.  Following that, the femur was measured to be a size 3.  I then carried out my anterior-posterior chamfering cuts of the distal femur.  I then directed attention to the tibia.  Tibial tray was measured to be a size 3.  I then made my initial drill hole in the tibial plateau.  At this particular time, we removed 6 mm thickness off the affected medial side of the tibia, but it was quite tight because of the varus  deformity.  Following that, I then inserted my lamina retracted spreaders, checked the posterior condyles and removed small spurs.  I thoroughly irrigated out the knee and then tension apparatus was inserted,  measured tension, we had excellent tension and motion with the 10 mm thickness insert.  I then removed the trials and then completed my preparation of the tibia.  I cut my keel cut of the tibia in the usual fashion.  I then cut my notch cut on the distal femur in the usual fashion.  The trial components were inserted.  I then did a resurfacing procedure on the patella for a 38 mm patella, 3 drill holes were made in the patella as well.  All trial components were removed.  I thoroughly water picked out the knee and cemented all 3 components in simultaneously.  Gentamicin was used in the cement.  Once the cement was hardened, I removed all loose pieces of cement.  Both prostheses were checked and they were in good solid position.  Following that, I injected a mixture of 20 mL of Exparel with 20 mL of  normal saline.  I aspirated to make sure we were not in any muscles.  Following that, we then removed our trial insert tibial insert and inserted our permanent rotating platform insert size 3, 10 mm thickness.  I reduced the knee, we had good motion, good medial and lateral stability, and good flexion extension.  I inserted the Hemovac drain and closed the wound layers in usual fashion.          ______________________________ Georges Lynch Darrelyn Hillock, M.D.     RAG/MEDQ  D:  09/05/2011  T:  09/05/2011  Job:  [id number]

## 2011-09-06 NOTE — Progress Notes (Signed)
ANTICOAGULATION CONSULT NOTE - Follow-Up Consult  Pharmacy Consult for Warfarin Indication: Atrial Fibrillation;  S/P L TKA  No Known Allergies  Patient Measurements: Height: 5\' 6"  (167.6 cm) Weight: 143 lb (64.864 kg) IBW/kg (Calculated) : 59.3    Vital Signs: Temp: 99.4 F (37.4 C) (06/27 0630) Temp src: Oral (06/27 0630) BP: 102/60 mmHg (06/27 0630) Pulse Rate: 65  (06/27 0630)  Labs:  Basename 09/06/11 0437 09/05/11 0730  HGB 9.9* --  HCT 30.6* --  PLT 202 --  APTT -- --  LABPROT 14.3 12.3  INR 1.09 0.90  HEPARINUNFRC -- --  CREATININE 0.55 --  CKTOTAL -- --  CKMB -- --  TROPONINI -- --    Estimated Creatinine Clearance: 52.5 ml/min (by C-G formula based on Cr of 0.55).   Medical History: Past Medical History  Diagnosis Date  . Stress fracture of right foot 12/06/2010    in boot  . Anxiety   . Stroke 1/13    CT- age related atrophy with mild small vessel ischemic change- no defecits  . GERD (gastroesophageal reflux disease)   . Arthritis   . Arrhythmia     atrial fibrillation/OV, EKG with CLEARANCE DR Katrinka Blazing 6/13 on chart, eccho 5/11 on chart, last stress test 2002, CHEST X RAY 1/13 EPIC  . A-fib   . Hypertension     per Dr Katrinka Blazing OV note,/ ESSENTIAL  . Warfarin anticoagulation     Medications:  Scheduled:     . atorvastatin  40 mg Oral QHS  .  ceFAZolin (ANCEF) IV  1 g Intravenous Q6H  . ferrous sulfate  325 mg Oral TID PC  . flecainide  100 mg Oral Q12H  . flecainide  50 mg Oral Q1400  . heparin  5,000 Units Subcutaneous Q12H  . HYDROmorphone      . HYDROmorphone      . warfarin  5 mg Oral ONCE-1800  . Warfarin - Pharmacist Dosing Inpatient   Does not apply q1800  . DISCONTD: bupivacaine liposome  20 mL Infiltration Once  . DISCONTD: flecainide  50-100 mg Oral TID   Infusions:     . lactated ringers 100 mL/hr at 09/06/11 0750  . DISCONTD: lactated ringers    . DISCONTD: lactated ringers    . DISCONTD: lactated ringers     PRN:  acetaminophen, acetaminophen, alum & mag hydroxide-simeth, bisacodyl, HYDROcodone-acetaminophen, HYDROmorphone (DILAUDID) injection, menthol-cetylpyridinium, methocarbamol (ROBAXIN) IV, methocarbamol, metoCLOPramide (REGLAN) injection, ondansetron (ZOFRAN) IV, ondansetron, oxymetazoline, phenol, polyethylene glycol, sodium phosphate, DISCONTD: bupivacaine liposome, DISCONTD:  HYDROmorphone (DILAUDID) injection DISCONTD: meperidine (DEMEROL) injection, DISCONTD: polymyxin / bacitracin (DOUBLE ANTIBIOTIC) irrigation, DISCONTD: promethazine, DISCONTD: sodium chloride irrigation, DISCONTD: Surgifoam 1 Gm with Thrombin 5,000 units (5 ml) topical solution  Inpatient warfarin doses this admission: 6/26: 5mg   Assessment:  78 y/o F on chronic warfarin for atrial fibrillation, underwent L TKA on 09/05/11.  Warfarin was interrupted prior to surgery and resumed postoperatively with pharmacy dosing.  Patient reported warfarin dosage PTA as 2.5mg  Sun,M,W,F;  5mg  Tues,Thurs,Sat.   Goal of Therapy:  INR 2-3    Plan:  1. Repeat warfarin 5mg  PO x 1 tonight. 2. Heparin 5000 units SQ q12h as ordered by ortho. 3. Follow PT/INR daily.  Hope Budds, PharmD, BCPS Pager [phone] 09/06/2011,9:55 AM

## 2011-09-06 NOTE — Progress Notes (Signed)
Utilization review completed.  

## 2011-09-06 NOTE — Evaluation (Signed)
Physical Therapy Evaluation Patient Details Name: Ann Rodriguez MRN: [id number] DOB: [birth year] Today's Date: 09/06/2011 Time: 9147-8295 PT Time Calculation (min): 22 min  PT Assessment / Plan / Recommendation Clinical Impression  Pt with L TKR presents with decreased L LE strength/ROM, limited activity tolerance 2* pain and fatigue, and limited functional mobility    PT Assessment  Patient needs continued PT services    Follow Up Recommendations  Skilled nursing facility    Barriers to Discharge        Equipment Recommendations  Defer to next venue    Recommendations for Other Services OT consult   Frequency 7X/week    Precautions / Restrictions Precautions Precautions: Knee Required Braces or Orthoses: Knee Immobilizer - Left Knee Immobilizer - Left: Discontinue once straight leg raise with < 10 degree lag Restrictions Weight Bearing Restrictions: No LLE Weight Bearing: Weight bearing as tolerated   Pertinent Vitals/Pain 7/10: premedicated, RN aware, cold packs provided      Mobility  Bed Mobility Bed Mobility: Supine to Sit Supine to Sit: 3: Mod assist Details for Bed Mobility Assistance: cues for sequence and use of R LE to self assist.  Transfers Transfers: Sit to Stand;Stand to Sit Sit to Stand: 1: +2 Total assist Sit to Stand: Patient Percentage: 60% Stand to Sit: 1: +2 Total assist Stand to Sit: Patient Percentage: 60% Details for Transfer Assistance: Cues for use of UEs and for LE management Ambulation/Gait Ambulation/Gait Assistance: 1: +2 Total assist Ambulation/Gait: Patient Percentage: 70% Ambulation Distance (Feet): 4 Feet Assistive device: Rolling walker Ambulation/Gait Assistance Details: cues for sequence, posture and position from RW Gait Pattern: Step-to pattern    Exercises Total Joint Exercises Ankle Circles/Pumps: AROM;Both;10 reps;Supine Quad Sets: AROM;10 reps;Supine;Both Heel Slides: AAROM;10 reps;Left;Supine Straight Leg Raises:  AAROM;10 reps;Left;Supine   PT Diagnosis: Difficulty walking  PT Problem List: Decreased strength;Decreased range of motion;Decreased activity tolerance;Decreased mobility;Pain;Decreased knowledge of use of DME PT Treatment Interventions: DME instruction;Gait training;Stair training;Functional mobility training;Therapeutic activities;Therapeutic exercise;Patient/family education   PT Goals Acute Rehab PT Goals PT Goal Formulation: With patient Time For Goal Achievement: 09/11/11 Potential to Achieve Goals: Good Pt will go Supine/Side to Sit: with supervision PT Goal: Supine/Side to Sit - Progress: Goal set today Pt will go Sit to Supine/Side: with supervision PT Goal: Sit to Supine/Side - Progress: Goal set today Pt will go Sit to Stand: with supervision PT Goal: Sit to Stand - Progress: Goal set today Pt will go Stand to Sit: with supervision PT Goal: Stand to Sit - Progress: Goal set today Pt will Ambulate: 51 - 150 feet;with supervision;with rolling walker PT Goal: Ambulate - Progress: Goal set today  Visit Information  Last PT Received On: 09/06/11 Assistance Needed: +2    Subjective Data  Subjective: It hurts, why did I let myself do this? Patient Stated Goal: Resume previous lifestyle with decreased pain   Prior Functioning  Home Living Lives With: Alone Prior Function Level of Independence: Independent Able to Take Stairs?: Yes Communication Communication: No difficulties Dominant Hand: Right    Cognition  Overall Cognitive Status: Appears within functional limits for tasks assessed/performed Arousal/Alertness: Awake/alert Orientation Level: Appears intact for tasks assessed Behavior During Session: [facility] for tasks performed    Extremity/Trunk Assessment Right Upper Extremity Assessment RUE ROM/Strength/Tone: [facility] for tasks assessed Left Upper Extremity Assessment LUE ROM/Strength/Tone: WFL for tasks assessed Right Lower Extremity Assessment RLE  ROM/Strength/Tone: Deficits RLE ROM/Strength/Tone Deficits: 2/5 quads; -10 - 30 AAROM at knee Left Lower Extremity Assessment LLE ROM/Strength/Tone:  WFL for tasks assessed   Balance    End of Session PT - End of Session Equipment Utilized During Treatment: Gait belt;Left knee immobilizer Activity Tolerance: Patient limited by pain;Patient limited by fatigue Patient left: in chair;with call bell/phone within reach Nurse Communication: Mobility status  GP     Ann Rodriguez 09/06/2011, 12:23 PM

## 2011-09-06 NOTE — Progress Notes (Signed)
Clinical Social Work Department BRIEF PSYCHOSOCIAL ASSESSMENT 09/06/2011  Patient:  Ann Rodriguez, Ann Rodriguez     Account Number:  [id number]     Admit date:  09/05/2011  Clinical Social Worker:  Candie Chroman  Date/Time:  09/06/2011 03:00 PM  Referred by:  Physician  Date Referred:  09/05/2011 Referred for  SNF Placement   Other Referral:   Interview type:  Patient Other interview type:    PSYCHOSOCIAL DATA Living Status:  ALONE Admitted from facility:   Level of care:   Primary support name:  Gershon Mussel Primary support relationship to patient:  CHILD, ADULT Degree of support available:   supportive    CURRENT CONCERNS Current Concerns  Post-Acute Placement   Other Concerns:    SOCIAL WORK ASSESSMENT / PLAN Pt is an 78 yr old female living home, alone prior to hospitalization. CSW met with pt/grandaughter today to assist with d/c planning. Pt has made prior arrangements for ST rehab at [facility] following hospital d/c. SNF contacted and confirmed d/c plan pending [employer] prior approval. Clinicals have been faxed to [facility] and CM contacted to confirm prior auth # would be available Fri for a Sat d/c.   Assessment/plan status:  Psychosocial Support/Ongoing Assessment of Needs Other assessment/ plan:   Information/referral to community resources:   None needed at this time.    PATIENT'S/FAMILY'S RESPONSE TO PLAN OF CARE: Pt looking forward to rehab at [facility].    Cori Razor LCSW [phone]

## 2011-09-07 ENCOUNTER — Encounter (HOSPITAL_COMMUNITY): Payer: Self-pay | Admitting: Orthopedic Surgery

## 2011-09-07 LAB — CBC
MCHC: 32.2 g/dL (ref 30.0–36.0)
RDW: 13.8 % (ref 11.5–15.5)

## 2011-09-07 LAB — PROTIME-INR
INR: 1.95 — ABNORMAL HIGH (ref 0.00–1.49)
Prothrombin Time: 22.6 seconds — ABNORMAL HIGH (ref 11.6–15.2)

## 2011-09-07 LAB — BASIC METABOLIC PANEL
GFR calc Af Amer: >90 mL/min (ref >90)
GFR calc non Af Amer: 85 mL/min — ABNORMAL LOW (ref >90)
Potassium: 4.3 mEq/L (ref 3.5–5.1)
Sodium: 136 mEq/L (ref 135–145)

## 2011-09-07 MED ORDER — HYDROCODONE-ACETAMINOPHEN 10-325 MG PO TABS: 1.0000 | ORAL_TABLET | ORAL | Status: AC | PRN

## 2011-09-07 MED ORDER — METHOCARBAMOL 500 MG PO TABS: 500.0000 mg | ORAL_TABLET | Freq: Four times a day (QID) | ORAL | Status: AC | PRN

## 2011-09-07 MED ORDER — WARFARIN SODIUM 1 MG PO TABS
1.0000 mg | ORAL_TABLET | Freq: Once | ORAL | Status: AC
  Administered 2011-09-07: 1 mg via ORAL
  Filled 2011-09-07: qty 1

## 2011-09-07 MED ORDER — FERROUS SULFATE 325 (65 FE) MG PO TABS: 325.0000 mg | ORAL_TABLET | Freq: Three times a day (TID) | ORAL | Status: DC

## 2011-09-07 NOTE — Progress Notes (Signed)
D/C summary sent to SNF for sat. Admit.  CSW continues to follow to assist with discharge planning.  Fleet Contras (coverage) [phone]

## 2011-09-07 NOTE — Progress Notes (Signed)
Physical Therapy Treatment Patient Details Name: Ann Rodriguez MRN: [id number] DOB: 03/09/1929 Today's Date: 09/07/2011 Time: 1152-1205 PT Time Calculation (min): 13 min  PT Assessment / Plan / Recommendation Comments on Treatment Session  Marked improvement in activity tolerance and with better pain control but pt demonstrating increased impulsive actions with increased cueing/assist to correct.    Follow Up Recommendations  Skilled nursing facility    Barriers to Discharge        Equipment Recommendations  Defer to next venue    Recommendations for Other Services OT consult  Frequency 7X/week   Plan Discharge plan remains appropriate    Precautions / Restrictions Precautions Precautions: Knee Required Braces or Orthoses: Knee Immobilizer - Left Knee Immobilizer - Left: Discontinue once straight leg raise with < 10 degree lag Restrictions Weight Bearing Restrictions: No LLE Weight Bearing: Weight bearing as tolerated   Pertinent Vitals/Pain 2-3/5; premedicated    Mobility  Transfers Transfers: Sit to Stand;Stand to Sit Sit to Stand: 4: Min assist;With upper extremity assist;From bed;From chair/3-in-1 Stand to Sit: 4: Min assist;With upper extremity assist;To chair/3-in-1 Details for Transfer Assistance: min verbal cues for hand placement and LE management Ambulation/Gait Ambulation/Gait Assistance: 4: Min assist Ambulation Distance (Feet): 115 Feet Assistive device: Rolling walker Ambulation/Gait Assistance Details: cues for position from RW, sequence, stride length and to reduce pace Gait Pattern: Step-to pattern;Step-through pattern    Exercises     PT Diagnosis:    PT Problem List:   PT Treatment Interventions:     PT Goals Acute Rehab PT Goals PT Goal Formulation: With patient Time For Goal Achievement: 09/11/11 Potential to Achieve Goals: Good Pt will go Sit to Stand: with supervision PT Goal: Sit to Stand - Progress: Progressing toward goal Pt will go  Stand to Sit: with supervision PT Goal: Stand to Sit - Progress: Progressing toward goal Pt will Ambulate: 51 - 150 feet;with supervision;with rolling walker PT Goal: Ambulate - Progress: Progressing toward goal  Visit Information  Last PT Received On: 09/07/11 Assistance Needed: +1    Subjective Data  Subjective: I'm doing much better than yesterday Patient Stated Goal: Resume previous lifestyle with decreased pain   Cognition  Overall Cognitive Status: Appears within functional limits for tasks assessed/performed Arousal/Alertness: Awake/alert Orientation Level: Appears intact for tasks assessed Behavior During Session: [name] for tasks performed    Balance  Balance Balance Assessed: Yes Dynamic Standing Balance Dynamic Standing - Level of Assistance: 4: Min assist  End of Session PT - End of Session Equipment Utilized During Treatment: Gait belt;Left knee immobilizer Activity Tolerance: Patient tolerated treatment well Patient left: in chair;with call bell/phone within reach;with family/visitor present Nurse Communication: Mobility status   GP     Ann Rodriguez 09/07/2011, 2:30 PM

## 2011-09-07 NOTE — Evaluation (Signed)
Occupational Therapy Evaluation Patient Details Name: Ann Rodriguez MRN: [id number] DOB: Sep 18, 1928 Today's Date: 09/07/2011 Time: 8119-1478 OT Time Calculation (min): 27 min  OT Assessment / Plan / Recommendation Clinical Impression  Pt is s/p L TKA and displays decreased strength, functional mobility and ADL. Will benefit from skilled OT services to improve independence with self care tasks.     OT Assessment  Patient needs continued OT Services    Follow Up Recommendations  Skilled nursing facility    Barriers to Discharge      Equipment Recommendations  Defer to next venue    Recommendations for Other Services    Frequency  Min 1X/week    Precautions / Restrictions Precautions Precautions: Knee Required Braces or Orthoses: Knee Immobilizer - Left Knee Immobilizer - Left: Discontinue once straight leg raise with < 10 degree lag Restrictions LLE Weight Bearing: Weight bearing as tolerated        ADL  Eating/Feeding: Simulated;Independent Where Assessed - Eating/Feeding: Chair Grooming: Performed;Wash/dry hands;Set up Where Assessed - Grooming: Supported sitting Upper Body Bathing: Simulated;Chest;Right arm;Left arm;Abdomen;Set up Where Assessed - Upper Body Bathing: Unsupported sitting Lower Body Bathing: Simulated;Minimal assistance Where Assessed - Lower Body Bathing: Supported sit to stand Upper Body Dressing: Simulated;Set up Where Assessed - Upper Body Dressing: Unsupported sitting Lower Body Dressing: Simulated;Moderate assistance Where Assessed - Lower Body Dressing: Supported sit to stand Toilet Transfer: Minimal assistance;Performed Toilet Transfer Method: Stand pivot;Other (comment) (to [facility] then chair) Toilet Transfer Equipment: Bedside commode Toileting - Clothing Manipulation and Hygiene: Simulated;Minimal assistance Where Assessed - Engineer, mining and Hygiene: Standing Tub/Shower Transfer Method: Not assessed Equipment Used: Rolling  walker    OT Diagnosis: Generalized weakness  OT Problem List: Decreased strength;Decreased knowledge of use of DME or AE;Pain OT Treatment Interventions: DME and/or AE instruction;Self-care/ADL training;Therapeutic activities;Patient/family education   OT Goals Acute Rehab OT Goals OT Goal Formulation: With patient Time For Goal Achievement: 09/14/11 Potential to Achieve Goals: Good ADL Goals Pt Will Perform Grooming: with supervision;Standing at sink ADL Goal: Grooming - Progress: Goal set today Pt Will Perform Lower Body Bathing: with supervision;Sit to stand from chair;Sit to stand from bed ADL Goal: Lower Body Bathing - Progress: Goal set today Pt Will Perform Lower Body Dressing: with supervision;Sit to stand from chair;Sit to stand from bed ADL Goal: Lower Body Dressing - Progress: Goal set today Pt Will Transfer to Toilet: with supervision;Ambulation;with DME;3-in-1 ADL Goal: Toilet Transfer - Progress: Goal set today Pt Will Perform Toileting - Clothing Manipulation: with supervision;Standing ADL Goal: Toileting - Clothing Manipulation - Progress: Goal set today  Visit Information  Last OT Received On: 09/07/11 Assistance Needed: +1    Subjective Data  Subjective: I would like to brush my teeth Patient Stated Goal: to be more independent   Prior Functioning  Home Living Lives With: Alone Available Help at Discharge: Skilled Nursing Facility Prior Function Level of Independence: Independent Able to Take Stairs?: Yes Communication Communication: No difficulties Dominant Hand: Right    Cognition  Overall Cognitive Status: Appears within functional limits for tasks assessed/performed Arousal/Alertness: Awake/alert Orientation Level: Appears intact for tasks assessed Behavior During Session: [facility] for tasks performed    Extremity/Trunk Assessment Right Upper Extremity Assessment RUE ROM/Strength/Tone: [facility] for tasks assessed Left Upper Extremity Assessment LUE  ROM/Strength/Tone: [facility] for tasks assessed   Mobility Bed Mobility Bed Mobility: Supine to Sit Supine to Sit: 4: Min assist;HOB elevated Transfers Transfers: Sit to Stand;Stand to Sit Sit to Stand: 4: Min assist;With upper extremity  assist;From bed;From chair/3-in-1 Stand to Sit: 4: Min assist;With upper extremity assist;To chair/3-in-1 Details for Transfer Assistance: min verbal cues for hand placement and LE management   Exercise    Balance Balance Balance Assessed: Yes Dynamic Standing Balance Dynamic Standing - Level of Assistance: 4: Min assist  End of Session OT - End of Session Activity Tolerance: Patient tolerated treatment well Patient left: in chair;with call bell/phone within reach       Ann Rodriguez [phone] 09/07/2011, 10:33 AM

## 2011-09-07 NOTE — Progress Notes (Signed)
ANTICOAGULATION CONSULT NOTE - Follow Up Consult  Pharmacy Consult for:  Warfarin Indication: Atrial fibrillation, S/P left TKA  No Known Allergies  Patient Measurements: Height: 5\' 6"  (167.6 cm) Weight: 143 lb (64.864 kg) IBW/kg (Calculated) : 59.3   Vital Signs: Temp: 98.8 F (37.1 C) (06/28 0530) Temp src: Oral (06/28 0530) BP: 128/69 mmHg (06/28 0530) Pulse Rate: 79  (06/28 0530)  Labs:  Basename 09/07/11 0412 09/06/11 0437 09/05/11 0730  HGB 9.2* 9.9* --  HCT 28.6* 30.6* --  PLT 183 202 --  APTT -- -- --  LABPROT 22.6* 14.3 12.3  INR 1.95* 1.09 0.90  HEPARINUNFRC -- -- --  CREATININE 0.57 0.55 --  CKTOTAL -- -- --  CKMB -- -- --  TROPONINI -- -- --    Estimated Creatinine Clearance: 52.5 ml/min (by C-G formula based on Cr of 0.57).   Medications:  Scheduled:    . atorvastatin  40 mg Oral QHS  . ferrous sulfate  325 mg Oral TID PC  . flecainide  100 mg Oral Q12H  . flecainide  50 mg Oral Q1400  . heparin  5,000 Units Subcutaneous Q12H  . warfarin  5 mg Oral ONCE-1800  . Warfarin - Pharmacist Dosing Inpatient   Does not apply q1800   Inpatient warfarin doses this admission:  5 mg, 5 mg  Assessment:  77 y/o F on chronic warfarin for atrial fibrillation, underwent L TKA on 09/05/11. Warfarin was interrupted prior to surgery and resumed postoperatively with pharmacy dosing.   Patient reported warfarin dosage PTA as 2.5mg  Sun,M,W,F; 5mg  Tues,Thurs,Sat.  Rapid increase in INR to 1.95 today after two doses of 5 mg.  Plan conservative dose today.  Goal of Therapy:  INR 2-3   Plan:   Warfarin 1 mg today.  Continue Heparin 5000 units SQ every 12 hours as ordered by surgeon.  Follow PT/INR daily.  Polo Riley R.Ph. 09/07/2011,10:34 AM

## 2011-09-07 NOTE — Discharge Summary (Signed)
Physician Discharge Summary   Patient ID: Ann Rodriguez MRN: [id number] DOB/AGE: May 26, 1928 78 y.o.  Admit date: 09/05/2011 Discharge date: 09/07/2011  Primary Diagnosis:  Osteoarthritis, left knee  Admission Diagnoses:  Past Medical History  Diagnosis Date  . Stress fracture of right foot 12/06/2010    in boot  . Anxiety   . Stroke 1/13    CT- age related atrophy with mild small vessel ischemic change- no defecits  . GERD (gastroesophageal reflux disease)   . Arthritis   . Arrhythmia     atrial fibrillation/OV, EKG with CLEARANCE DR Katrinka Blazing 6/13 on chart, eccho 5/11 on chart, last stress test 2002, CHEST X RAY 1/13 EPIC  . A-fib   . Hypertension     per Dr Katrinka Blazing OV note,/ ESSENTIAL  . Warfarin anticoagulation    Discharge Diagnoses:   Active Problems:  Osteoarthritis of left knee S/P left total knee arthroplasty Procedure:  Procedure(s) (LRB): TOTAL KNEE ARTHROPLASTY (Left)   Consults: None  HPI: The patient is a 78 year old female who has been followed for over a year by Dr. Jeannetta Ellis office. She had arthroscopic surgery, cortisone injections. She's had Hyalgan injections. She's still having significant pain. She really is at the point now where she can't tolerate her pain. We went back through and showed her the photographs of her arthroscopy, and she has complete wear of her knee joint. She has a severely degenerated arthritic knee.     Laboratory Data: Hospital Outpatient Visit on 08/28/2011  Component Date Value Range Status  . aPTT 08/28/2011 45* 24 - 37 seconds Final   Comment:                                 IF BASELINE aPTT IS ELEVATED,                          SUGGEST PATIENT RISK ASSESSMENT                          BE USED TO DETERMINE APPROPRIATE                          ANTICOAGULANT THERAPY.  . WBC 08/28/2011 5.3  4.0 - 10.5 K/uL Final  . RBC 08/28/2011 4.66  3.87 - 5.11 MIL/uL Final  . Hemoglobin 08/28/2011 14.4  12.0 - 15.0 g/dL Final  . HCT  40/98/1191 44.5  36.0 - 46.0 % Final  . MCV 08/28/2011 95.5  78.0 - 100.0 fL Final  . MCH 08/28/2011 30.9  26.0 - 34.0 pg Final  . MCHC 08/28/2011 32.4  30.0 - 36.0 g/dL Final  . RDW 47/82/9562 13.6  11.5 - 15.5 % Final  . Platelets 08/28/2011 289  150 - 400 K/uL Final  . Sodium 08/28/2011 139  135 - 145 mEq/L Final  . Potassium 08/28/2011 4.3  3.5 - 5.1 mEq/L Final  . Chloride 08/28/2011 103  96 - 112 mEq/L Final  . CO2 08/28/2011 28  19 - 32 mEq/L Final  . Glucose, Bld 08/28/2011 95  70 - 99 mg/dL Final  . BUN 13/10/6576 19  6 - 23 mg/dL Final  . Creatinine, Ser 08/28/2011 0.72  0.50 - 1.10 mg/dL Final  . Calcium 46/96/2952 9.8  8.4 - 10.5 mg/dL Final  . Total Protein 08/28/2011 6.9  6.0 - 8.3  g/dL Final  . Albumin 16/12/9602 3.7  3.5 - 5.2 g/dL Final  . AST 54/11/8117 22  0 - 37 U/L Final  . ALT 08/28/2011 22  0 - 35 U/L Final  . Alkaline Phosphatase 08/28/2011 84  39 - 117 U/L Final  . Total Bilirubin 08/28/2011 0.9  0.3 - 1.2 mg/dL Final  . GFR calc non Af Amer 08/28/2011 79* >90 mL/min Final  . GFR calc Af Amer 08/28/2011 >90  >90 mL/min Final   Comment:                                 The eGFR has been calculated                          using the CKD EPI equation.                          This calculation has not been                          validated in all clinical                          situations.                          eGFR's persistently                          <90 mL/min signify                          possible Chronic Kidney Disease.  Marland Kitchen Neutrophils Relative 08/28/2011 68  43 - 77 % Final  . Neutro Abs 08/28/2011 3.6  1.7 - 7.7 K/uL Final  . Lymphocytes Relative 08/28/2011 20  12 - 46 % Final  . Lymphs Abs 08/28/2011 1.1  0.7 - 4.0 K/uL Final  . Monocytes Relative 08/28/2011 10  3 - 12 % Final  . Monocytes Absolute 08/28/2011 0.6  0.1 - 1.0 K/uL Final  . Eosinophils Relative 08/28/2011 1  0 - 5 % Final  . Eosinophils Absolute 08/28/2011 0.1  0.0 - 0.7 K/uL  Final  . Basophils Relative 08/28/2011 0  0 - 1 % Final  . Basophils Absolute 08/28/2011 0.0  0.0 - 0.1 K/uL Final  . Prothrombin Time 08/28/2011 27.4* 11.6 - 15.2 seconds Final  . INR 08/28/2011 2.50* 0.00 - 1.49 Final  . Color, Urine 08/28/2011 YELLOW  YELLOW Final  . APPearance 08/28/2011 CLOUDY* CLEAR Final  . Specific Gravity, Urine 08/28/2011 1.026  1.005 - 1.030 Final  . pH 08/28/2011 5.5  5.0 - 8.0 Final  . Glucose, UA 08/28/2011 NEGATIVE  NEGATIVE mg/dL Final  . Hgb urine dipstick 08/28/2011 NEGATIVE  NEGATIVE Final  . Bilirubin Urine 08/28/2011 NEGATIVE  NEGATIVE Final  . Ketones, ur 08/28/2011 NEGATIVE  NEGATIVE mg/dL Final  . Protein, ur 14/78/2956 NEGATIVE  NEGATIVE mg/dL Final  . Urobilinogen, UA 08/28/2011 0.2  0.0 - 1.0 mg/dL Final  . Nitrite 21/30/8657 NEGATIVE  NEGATIVE Final  . Leukocytes, UA 08/28/2011 MODERATE* NEGATIVE Final  . MRSA, PCR 08/28/2011 NEGATIVE  NEGATIVE Final  . Staphylococcus aureus 08/28/2011 NEGATIVE  NEGATIVE Final   Comment:  The Xpert SA Assay (FDA                          approved for NASAL specimens                          only), is one component of                          a comprehensive surveillance                          program.  It is not intended                          to diagnose infection nor to                          guide or monitor treatment.  . Squamous Epithelial / LPF 08/28/2011 RARE  RARE Final   RARE  . WBC, UA 08/28/2011 7-10  <3 WBC/hpf Final   7-10  . RBC / HPF 08/28/2011 0-2  <3 RBC/hpf Final   0-2  . Bacteria, UA 08/28/2011 MANY* RARE Final   MANY  . Casts 08/28/2011 HYALINE CASTS* NEGATIVE Final   HYALINE CASTS  . Urine-Other 08/28/2011 MUCOUS PRESENT   Final   MUCOUS PRESENT    Basename 09/07/11 0412 09/06/11 0437  HGB 9.2* 9.9*    Basename 09/07/11 0412 09/06/11 0437  WBC 9.2 8.5  RBC 2.95* 3.20*  HCT 28.6* 30.6*  PLT 183 202    Basename 09/07/11 0412 09/06/11  0437  NA 136 136  K 4.3 3.9  CL 102 103  CO2 31 28  BUN 6 7  CREATININE 0.57 0.55  GLUCOSE 105* 112*  CALCIUM 8.9 8.7    Basename 09/07/11 0412 09/06/11 0437  LABPT -- --  INR 1.95* 1.09    X-Rays:X-ray Knee Left Port  09/05/2011  *RADIOLOGY REPORT*  Clinical Data: Postop left total knee replacement.  PORTABLE LEFT KNEE - 1-2 VIEW  Comparison: None.  Findings: Left total knee arthroplasty.  Surgical drain is in place.  Subcutaneous and joint air and fluid are noted.  IMPRESSION: Left total knee arthroplasty with expected postoperative findings.  Original Report Authenticated By: Reyes Ivan, M.D.    EKG: Orders placed during the hospital encounter of 07/24/11  . EKG 12-LEAD  . EKG 12-LEAD  . ED EKG  . ED EKG  . EKG     Hospital Course: Patient was admitted to [facility] and taken to the OR and underwent the above state procedure without complications.  Patient tolerated the procedure well and was later transferred to the recovery room and then to the orthopaedic floor for postoperative care.  They were given PO and IV analgesics for pain control following their surgery.  They were given 24 hours of postoperative antibiotics and started on DVT prophylaxis in the form of Coumadin and Heparin.   PT and OT were ordered for total joint protocol.  Discharge planning consulted to help with postop disposition and equipment needs.  Patient had a decent night on the evening of surgery and started to get up OOB with therapy on day one. Hemovac drain was pulled without difficulty.  Patient with postoperative anemia but ok with iron supplementation. No transfusion  needed. Continued to work with therapy into day two.  Appetite and pain control much improved on day two. Dressing was changed on day two and the incision was clean, dry, with no drainage.  Patient scheduled to continue therapy through day three and be discharged to [facility].  Discharge Medications: Prior to Admission  medications   Medication Sig Start Date End Date Taking? Authorizing Provider  flecainide (TAMBOCOR) 100 MG tablet Take 50-100 mg by mouth 3 (three) times daily. 100 mg  AM and nightly,   50mg   midday   Yes Historical Provider, MD  atorvastatin (LIPITOR) 40 MG tablet Take 40 mg by mouth at bedtime.    Historical Provider, MD  beta carotene w/minerals (OCUVITE) tablet Take 1 tablet by mouth daily with breakfast.     Historical Provider, MD  calcium carbonate (OS-CAL) 600 MG TABS Take 1,800 mg by mouth daily with breakfast.     Historical Provider, MD  cholecalciferol (VITAMIN D) 1000 UNITS tablet Take 2,000 Units by mouth 2 (two) times daily.     Historical Provider, MD  ferrous sulfate 325 (65 FE) MG tablet Take 1 tablet (325 mg total) by mouth 3 (three) times daily after meals. 09/07/11 09/06/12  Kylan Veach Tamala Ser, PA  HYDROcodone-acetaminophen (NORCO) 10-325 MG per tablet Take 1-2 tablets by mouth every 4 (four) hours as needed (breakthrough pain). 09/07/11 09/17/11  Taijon Vink Tamala Ser, PA  methocarbamol (ROBAXIN) 500 MG tablet Take 1 tablet (500 mg total) by mouth every 6 (six) hours as needed. 09/07/11 09/17/11  Jessup Ogas Tamala Ser, PA  oxymetazoline (AFRIN) 0.05 % nasal spray Place 1 spray into the nose 2 (two) times daily as needed. ALLERGIES     Historical Provider, MD  polyethylene glycol (MIRALAX / GLYCOLAX) packet Take 17 g by mouth daily with breakfast.    Historical Provider, MD  warfarin (COUMADIN) 5 MG tablet Take 2.5-5 mg by mouth daily. Takes 5 mg on Tues, Thurs, Sat & 2.5 mg on Sun, Mon, Wed, Fri.    Historical Provider, MD    Diet: Cardiac diet Activity:WBAT Follow-up:in 2 weeks Disposition - Skilled nursing facility Discharged Condition: good   Discharge Orders    Future Orders Please Complete By Expires   Diet - low sodium heart healthy      Call MD / Call 911      Comments:   If you experience chest pain or shortness of breath, CALL 911 and be transported to the  hospital emergency room.  If you develope a fever above 101 F, pus (white drainage) or increased drainage or redness at the wound, or calf pain, call your surgeon's office.   Constipation Prevention      Comments:   Drink plenty of fluids.  Prune juice may be helpful.  You may use a stool softener, such as Colace (over the counter) 100 mg twice a day.  Use MiraLax (over the counter) for constipation as needed.   Increase activity slowly as tolerated      Discharge instructions      Comments:   Walk with your walker. Weight bearing as instructed. Change your dressing daily. Shower only, no tub bath. Call if any temperatures greater than 101 or any wound complications: [phone] during the day and ask for Dr. Jeannetta Ellis nurse, Mackey Birchwood.   Driving restrictions      Comments:   No driving   Do not put a pillow under the knee. Place it under the heel.  Medication List  As of 09/07/2011  8:09 AM   STOP taking these medications         aspirin 81 MG chewable tablet         TAKE these medications         atorvastatin 40 MG tablet   Commonly known as: LIPITOR   Take 40 mg by mouth at bedtime.      beta carotene w/minerals tablet   Take 1 tablet by mouth daily with breakfast.      calcium carbonate 600 MG Tabs   Commonly known as: OS-CAL   Take 1,800 mg by mouth daily with breakfast.      cholecalciferol 1000 UNITS tablet   Commonly known as: VITAMIN D   Take 2,000 Units by mouth 2 (two) times daily.      ferrous sulfate 325 (65 FE) MG tablet   Take 1 tablet (325 mg total) by mouth 3 (three) times daily after meals.      flecainide 100 MG tablet   Commonly known as: TAMBOCOR   Take 50-100 mg by mouth 3 (three) times daily. 100 mg  AM and nightly,   50mg   midday      HYDROcodone-acetaminophen 10-325 MG per tablet   Commonly known as: NORCO   Take 1-2 tablets by mouth every 4 (four) hours as needed (breakthrough pain).      methocarbamol 500 MG tablet   Commonly  known as: ROBAXIN   Take 1 tablet (500 mg total) by mouth every 6 (six) hours as needed.      oxymetazoline 0.05 % nasal spray   Commonly known as: AFRIN   Place 1 spray into the nose 2 (two) times daily as needed. ALLERGIES        polyethylene glycol packet   Commonly known as: MIRALAX / GLYCOLAX   Take 17 g by mouth daily with breakfast.      warfarin 5 MG tablet   Commonly known as: COUMADIN   Take 2.5-5 mg by mouth daily. Takes 5 mg on Tues, Thurs, Sat & 2.5 mg on Sun, Mon, Wed, Fri.           Anticoagulation may need to be adjusted after submission of discharge summary due to labs pending POD 3. SNF will be informed of final plan for anticoagulation on day of discharge.   Signed: Matthew Pais LAUREN 09/07/2011, 8:09 AM

## 2011-09-07 NOTE — Progress Notes (Signed)
Physical Therapy Treatment Patient Details Name: Ann Rodriguez MRN: [id number] DOB: March 04, 1929 Today's Date: 09/07/2011 Time: 4098-1191 PT Time Calculation (min): 20 min  PT Assessment / Plan / Recommendation Comments on Treatment Session  Marked improvement in activity tolerance and with better pain control but pt demonstrating increased impulsive actions with increased cueing/assist to correct.    Follow Up Recommendations  Skilled nursing facility    Barriers to Discharge        Equipment Recommendations  Defer to next venue    Recommendations for Other Services OT consult  Frequency 7X/week   Plan Discharge plan remains appropriate    Precautions / Restrictions Precautions Precautions: Knee Required Braces or Orthoses: Knee Immobilizer - Left Knee Immobilizer - Left: Discontinue once straight leg raise with < 10 degree lag Restrictions Weight Bearing Restrictions: No LLE Weight Bearing: Weight bearing as tolerated   Pertinent Vitals/Pain        Exercises Total Joint Exercises Ankle Circles/Pumps: AROM;Both;20 reps;Supine Quad Sets: AROM;20 reps;Both;Supine Heel Slides: AAROM;20 reps;Supine;Left Straight Leg Raises: AAROM;20 reps;Supine;Left Goniometric ROM: -10 - 60 AAROM   PT Diagnosis:    PT Problem List:   PT Treatment Interventions:     PT Goals Acute Rehab PT Goals PT Goal Formulation: With patient Time For Goal Achievement: 09/11/11 Potential to Achieve Goals: Good Pt will go Supine/Side to Sit: with supervision PT Goal: Supine/Side to Sit - Progress: Progressing toward goal Pt will go Sit to Supine/Side: with supervision PT Goal: Sit to Supine/Side - Progress: Progressing toward goal Pt will go Sit to Stand: with supervision PT Goal: Sit to Stand - Progress: Progressing toward goal Pt will go Stand to Sit: with supervision PT Goal: Stand to Sit - Progress: Progressing toward goal Pt will Ambulate: 51 - 150 feet;with supervision;with rolling  walker PT Goal: Ambulate - Progress: Progressing toward goal  Visit Information  Last PT Received On: 09/07/11 Assistance Needed: +1    Subjective Data  Subjective: I'm ready Patient Stated Goal: Resume previous lifestyle with decreased pain   Cognition  Overall Cognitive Status: Appears within functional limits for tasks assessed/performed Arousal/Alertness: Awake/alert Orientation Level: Appears intact for tasks assessed Behavior During Session: [facility] for tasks performed Cognition - Other Comments: Increased difficulty focusing on and following commands - ?MEDS    Balance     End of Session PT - End of Session Equipment Utilized During Treatment: Gait belt;Left knee immobilizer Activity Tolerance: Patient tolerated treatment well Patient left: in bed;with call bell/phone within reach Nurse Communication: Mobility status   GP     Ann Rodriguez 09/07/2011, 3:46 PM

## 2011-09-07 NOTE — Progress Notes (Signed)
Subjective: Doing Fine. Awaiting SNF Sat.   Objective: Vital signs in last 24 hours: Temp:  [98.4 F (36.9 C)-99.1 F (37.3 C)] 98.8 F (37.1 C) (06/28 0530) Pulse Rate:  [78-84] 79  (06/28 0530) Resp:  [12-16] 14  (06/28 0530) BP: (95-128)/(56-69) 128/69 mmHg (06/28 0530) SpO2:  [61 %-97 %] 90 % (06/28 0530)  Intake/Output from previous day: 06/27 0701 - 06/28 0700 In: 3553.3 [P.O.:480; I.V.:3073.3] Out: 1250 [Urine:1250] Intake/Output this shift: Total I/O In: 240 [P.O.:240] Out: 600 [Urine:600]   Basename 09/07/11 0412 09/06/11 0437  HGB 9.2* 9.9*    Basename 09/07/11 0412 09/06/11 0437  WBC 9.2 8.5  RBC 2.95* 3.20*  HCT 28.6* 30.6*  PLT 183 202    Basename 09/07/11 0412 09/06/11 0437  NA 136 136  K 4.3 3.9  CL 102 103  CO2 31 28  BUN 6 7  CREATININE 0.57 0.55  GLUCOSE 105* 112*  CALCIUM 8.9 8.7    Basename 09/07/11 0412 09/06/11 0437  LABPT -- --  INR 1.95* 1.09    Dorsiflexion/Plantar flexion intact  Assessment/Plan: DC  Sat. To SNF   Heman Que A 09/07/2011, 11:01 AM

## 2011-09-07 NOTE — Progress Notes (Signed)
Subjective: 2 Days Post-Op Procedure(s) (LRB): TOTAL KNEE ARTHROPLASTY (Left) Patient reports pain as mild.   Patient seen in rounds without Dr. Darrelyn Hillock. Patient is well, and has had no acute complaints or problems. She slept better last night and she got up with therapy once yesterday. Her appetite is improved greatly from yesterday. She denies chest pain and shortness of breath. Foley pulled yesterday and she is voiding well on her own.  Plan is to go Skilled nursing facility after hospital stay, tentative tomorrow.   Objective: Vital signs in last 24 hours: Temp:  [98.2 F (36.8 C)-99.1 F (37.3 C)] 98.8 F (37.1 C) (06/28 0530) Pulse Rate:  [67-84] 79  (06/28 0530) Resp:  [12-16] 14  (06/28 0530) BP: (95-128)/(56-69) 128/69 mmHg (06/28 0530) SpO2:  [61 %-97 %] 90 % (06/28 0530)  Intake/Output from previous day:  Intake/Output Summary (Last 24 hours) at 09/07/11 0758 Last data filed at 09/07/11 0550  Gross per 24 hour  Intake 3313.34 ml  Output   1250 ml  Net 2063.34 ml     Labs:  Basename 09/07/11 0412 09/06/11 0437  HGB 9.2* 9.9*    Basename 09/07/11 0412 09/06/11 0437  WBC 9.2 8.5  RBC 2.95* 3.20*  HCT 28.6* 30.6*  PLT 183 202    Basename 09/07/11 0412 09/06/11 0437  NA 136 136  K 4.3 3.9  CL 102 103  CO2 31 28  BUN 6 7  CREATININE 0.57 0.55  GLUCOSE 105* 112*  CALCIUM 8.9 8.7    Basename 09/07/11 0412 09/06/11 0437  LABPT -- --  INR 1.95* 1.09    EXAM General - Patient is Alert and Oriented Extremity - Neurologically intact Neurovascular intact Dorsiflexion/Plantar flexion intact Incision: dressing C/D/I Dressing/Incision - clean, dry, healing Motor Function - intact, moving foot and toes well on exam.   Past Medical History  Diagnosis Date  . Stress fracture of right foot 12/06/2010    in boot  . Anxiety   . Stroke 1/13    CT- age related atrophy with mild small vessel ischemic change- no defecits  . GERD (gastroesophageal reflux  disease)   . Arthritis   . Arrhythmia     atrial fibrillation/OV, EKG with CLEARANCE DR Katrinka Blazing 6/13 on chart, eccho 5/11 on chart, last stress test 2002, CHEST X RAY 1/13 EPIC  . A-fib   . Hypertension     per Dr Katrinka Blazing OV note,/ ESSENTIAL  . Warfarin anticoagulation     Assessment/Plan: 2 Days Post-Op Procedure(s) (LRB): TOTAL KNEE ARTHROPLASTY (Left) Active Problems:  Osteoarthritis of left knee   Advance diet Up with therapy D/C IV fluids Plan for discharge tomorrow Discharge to SNF tomorrow  Patient going to [facility]   DVT Prophylaxis - Coumadin Weight-Bearing as tolerated to left leg Will recheck H&H in the morning  Tylie Golonka LAUREN 09/07/2011, 7:58 AM

## 2011-09-07 NOTE — Progress Notes (Signed)
Physical Therapy Treatment Patient Details Name: Ann Rodriguez MRN: [id number] DOB: [birth year] Today's Date: 09/07/2011 Time: 4098-1191 PT Time Calculation (min): 12 min  PT Assessment / Plan / Recommendation Comments on Treatment Session  Marked improvement in activity tolerance and with better pain control but pt demonstrating increased impulsive actions with increased cueing/assist to correct.    Follow Up Recommendations  Skilled nursing facility    Barriers to Discharge        Equipment Recommendations  Defer to next venue    Recommendations for Other Services OT consult  Frequency 7X/week   Plan Discharge plan remains appropriate    Precautions / Restrictions Precautions Precautions: Knee Required Braces or Orthoses: Knee Immobilizer - Left Knee Immobilizer - Left: Discontinue once straight leg raise with < 10 degree lag Restrictions Weight Bearing Restrictions: No LLE Weight Bearing: Weight bearing as tolerated   Pertinent Vitals/Pain 5-6/10 with WB, pt had declined prior to ambulating but would now like MEDS before attempting there ex; RN aware and providing MEDS    Mobility  Bed Mobility Bed Mobility: Sit to Supine Supine to Sit: 4: Min assist Details for Bed Mobility Assistance: cues for sequence and use of R LE to self assist.  Transfers Transfers: Sit to Stand;Stand to Sit Sit to Stand: 4: Min assist;With upper extremity assist;From chair/3-in-1;With armrests Stand to Sit: 4: Min assist;With upper extremity assist;With armrests;To bed Details for Transfer Assistance: min verbal cues for hand placement and LE management Ambulation/Gait Ambulation/Gait Assistance: 4: Min assist Ambulation Distance (Feet): 102 Feet Assistive device: Rolling walker Ambulation/Gait Assistance Details: cues for position from RW, sequence, stride length and to reduce pace Gait Pattern: Step-to pattern;Step-through pattern    Exercises     PT Diagnosis:    PT Problem List:   PT  Treatment Interventions:     PT Goals Acute Rehab PT Goals PT Goal Formulation: With patient Time For Goal Achievement: 09/11/11 Potential to Achieve Goals: Good Pt will go Supine/Side to Sit: with supervision PT Goal: Supine/Side to Sit - Progress: Progressing toward goal Pt will go Sit to Supine/Side: with supervision PT Goal: Sit to Supine/Side - Progress: Progressing toward goal Pt will go Sit to Stand: with supervision PT Goal: Sit to Stand - Progress: Progressing toward goal Pt will go Stand to Sit: with supervision PT Goal: Stand to Sit - Progress: Progressing toward goal Pt will Ambulate: 51 - 150 feet;with supervision;with rolling walker PT Goal: Ambulate - Progress: Progressing toward goal  Visit Information  Last PT Received On: 09/07/11 Assistance Needed: +1    Subjective Data  Subjective: I said I didn't want the pain medicine but it hurts more than this morning Patient Stated Goal: Resume previous lifestyle with decreased pain   Cognition  Overall Cognitive Status: Appears within functional limits for tasks assessed/performed Arousal/Alertness: Awake/alert Orientation Level: Appears intact for tasks assessed Behavior During Session: [facility] for tasks performed Cognition - Other Comments: Increased difficulty focusing on and following commands - ?MEDS    Balance     End of Session PT - End of Session Equipment Utilized During Treatment: Gait belt;Left knee immobilizer Activity Tolerance: Patient tolerated treatment well Patient left: in bed;with call bell/phone within reach Nurse Communication: Mobility status   GP     Suella Cogar 09/07/2011, 2:46 PM

## 2011-09-08 LAB — CBC
MCV: 94.7 fL (ref 78.0–100.0)
Platelets: 190 10*3/uL (ref 150–400)
RBC: 2.82 MIL/uL — ABNORMAL LOW (ref 3.87–5.11)
WBC: 8.6 10*3/uL (ref 4.0–10.5)

## 2011-09-08 NOTE — Progress Notes (Signed)
Subjective: Acute Blood Loss Anemia. Doing very Well this A.M. Will DC to SNF   Objective: Vital signs in last 24 hours: Temp:  [97.8 F (36.6 C)-100.3 F (37.9 C)] 98.6 F (37 C) (06/29 0600) Pulse Rate:  [80-83] 80  (06/29 0600) Resp:  [14-16] 16  (06/29 0600) BP: (106-121)/(63-72) 121/72 mmHg (06/29 0600) SpO2:  [91 %-95 %] 91 % (06/29 0600)  Intake/Output from previous day: 06/28 0701 - 06/29 0700 In: 960 [P.O.:960] Out: 2700 [Urine:2700] Intake/Output this shift:     Basename 09/08/11 0414 09/07/11 0412 09/06/11 0437  HGB 8.8* 9.2* 9.9*    Basename 09/08/11 0414 09/07/11 0412  WBC 8.6 9.2  RBC 2.82* 2.95*  HCT 26.7* 28.6*  PLT 190 183    Basename 09/07/11 0412 09/06/11 0437  NA 136 136  K 4.3 3.9  CL 102 103  CO2 31 28  BUN 6 7  CREATININE 0.57 0.55  GLUCOSE 105* 112*  CALCIUM 8.9 8.7    Basename 09/08/11 0414 09/07/11 0412  LABPT -- --  INR 1.72* 1.95*    Dorsiflexion/Plantar flexion intact No cellulitis present  Assessment/Plan: DC today To SNF   Ann Rodriguez A 09/08/2011, 7:56 AM

## 2011-09-08 NOTE — Progress Notes (Signed)
Physical Therapy Treatment Patient Details Name: Ann Rodriguez MRN: [id number] DOB: 10-28-1928 Today's Date: 09/08/2011 Time: 7829-5621 PT Time Calculation (min): 30 min  PT Assessment / Plan / Recommendation Comments on Treatment Session  Improved mobility today with less asisstance/cues required and increased knee ROM with measurements.    Follow Up Recommendations  Skilled nursing facility       Equipment Recommendations  Defer to next venue       Frequency 7X/week   Plan Discharge plan remains appropriate;Frequency remains appropriate    Precautions / Restrictions Precautions Precautions: Knee Required Braces or Orthoses: Knee Immobilizer - Left Knee Immobilizer - Left: Discontinue once straight leg raise with < 10 degree lag Restrictions Weight Bearing Restrictions: No LLE Weight Bearing: Weight bearing as tolerated    Pertinent Vitals/Pain 4/10 pain in left knee before session and 6-7/10 after session. RN made aware and in room with pain meds after PT session. Cryo applied to left knee for decreased pain and swelling.    Mobility  Bed Mobility Bed Mobility: Supine to Sit;Sitting - Scoot to Edge of Bed Supine to Sit: 5: Supervision;HOB flat Sitting - Scoot to Edge of Bed: 5: Supervision Details for Bed Mobility Assistance: cues for use of arms and hand placement to ease movements. Pt used right LE to support the left LE with movements. Transfers Transfers: Sit to Stand;Stand to Sit Sit to Stand: 4: Min guard;From bed;From toilet;With upper extremity assist;With armrests Stand to Sit: 5: Supervision;To chair/3-in-1;To toilet;With upper extremity assist;With armrests Ambulation/Gait Ambulation/Gait Assistance: 4: Min guard Ambulation Distance (Feet): 120 Feet Assistive device: Rolling walker Ambulation/Gait Assistance Details: cues for safe hand placement on walker, walker position with gait, and to decreased left step length and increase the right step length. with  cues pt progressed from step-to pattern to a step-through pattern with increased walker safety. Gait Pattern: Step-to pattern;Step-through pattern;Antalgic;Decreased step length - right    Exercises Total Joint Exercises Ankle Circles/Pumps: AROM;Both;10 reps;Supine Quad Sets: AROM;Strengthening;Left;10 reps;Supine Heel Slides: AAROM;Strengthening;Left;10 reps;Supine Hip ABduction/ADduction: AAROM;Strengthening;Left;10 reps;Supine Straight Leg Raises: AAROM;Strengthening;Left;10 reps;Supine Goniometric ROM: 4-65 degrees flexion supine AAROM    PT Goals Acute Rehab PT Goals PT Goal: Supine/Side to Sit - Progress: Met PT Goal: Sit to Stand - Progress: Progressing toward goal PT Goal: Stand to Sit - Progress: Met PT Goal: Ambulate - Progress: Met  Visit Information  Last PT Received On: 09/08/11 Assistance Needed: +1    Subjective Data  Subjective: No new complaints, agreeable to therapy today.   Cognition  Overall Cognitive Status: Appears within functional limits for tasks assessed/performed Arousal/Alertness: Awake/alert Orientation Level: Appears intact for tasks assessed Behavior During Session: [facility] for tasks performed    Balance  Balance Balance Assessed: No  End of Session PT - End of Session Equipment Utilized During Treatment: Gait belt;Left knee immobilizer Activity Tolerance: Patient tolerated treatment well Patient left: in chair;with call bell/phone within reach Nurse Communication: Mobility status;Patient requests pain meds     Sallyanne Kuster 09/08/2011, 11:37 AM  Sallyanne Kuster, PTA Office- [phone]

## 2011-09-08 NOTE — Progress Notes (Signed)
Discharged from floor via stretcher, EMS with pt. No changes in assessment.Ann Rodriguez  

## 2011-09-08 NOTE — Progress Notes (Signed)
Pt to be d/c today to [facility]   Pt and family agreeable. Confirmed plans with facility.  Plan transfer via EMS.   Lourdez Mcgahan, LCSWA [place] [facility] Coverage [phone]   

## 2011-10-01 ENCOUNTER — Ambulatory Visit: Payer: Medicare Other | Attending: Orthopedic Surgery

## 2011-10-01 DIAGNOSIS — IMO0001 Reserved for inherently not codable concepts without codable children: Secondary | ICD-10-CM | POA: Insufficient documentation

## 2011-10-01 DIAGNOSIS — M25619 Stiffness of unspecified shoulder, not elsewhere classified: Secondary | ICD-10-CM | POA: Insufficient documentation

## 2011-10-01 DIAGNOSIS — R5381 Other malaise: Secondary | ICD-10-CM | POA: Insufficient documentation

## 2011-10-01 DIAGNOSIS — M25519 Pain in unspecified shoulder: Secondary | ICD-10-CM | POA: Insufficient documentation

## 2011-10-03 ENCOUNTER — Ambulatory Visit: Payer: Medicare Other | Admitting: Physical Therapy

## 2011-10-08 ENCOUNTER — Ambulatory Visit: Payer: Medicare Other

## 2011-10-11 ENCOUNTER — Ambulatory Visit: Payer: Medicare Other | Attending: Orthopedic Surgery

## 2011-10-11 DIAGNOSIS — M25569 Pain in unspecified knee: Secondary | ICD-10-CM | POA: Insufficient documentation

## 2011-10-11 DIAGNOSIS — IMO0001 Reserved for inherently not codable concepts without codable children: Secondary | ICD-10-CM | POA: Insufficient documentation

## 2011-10-11 DIAGNOSIS — M6281 Muscle weakness (generalized): Secondary | ICD-10-CM | POA: Insufficient documentation

## 2011-10-11 DIAGNOSIS — M25669 Stiffness of unspecified knee, not elsewhere classified: Secondary | ICD-10-CM | POA: Insufficient documentation

## 2011-10-11 DIAGNOSIS — R262 Difficulty in walking, not elsewhere classified: Secondary | ICD-10-CM | POA: Insufficient documentation

## 2011-10-15 ENCOUNTER — Ambulatory Visit: Payer: Medicare Other | Admitting: Physical Therapy

## 2011-10-18 ENCOUNTER — Ambulatory Visit: Payer: Medicare Other

## 2011-10-22 ENCOUNTER — Ambulatory Visit: Payer: Medicare Other | Admitting: Physical Therapy

## 2011-10-25 ENCOUNTER — Ambulatory Visit: Payer: Medicare Other | Admitting: Physical Therapy

## 2011-10-29 ENCOUNTER — Ambulatory Visit: Payer: Medicare Other | Admitting: Physical Therapy

## 2011-10-30 ENCOUNTER — Encounter: Payer: Medicare Other | Admitting: Physical Therapy

## 2011-11-01 ENCOUNTER — Ambulatory Visit: Payer: Medicare Other

## 2012-06-10 ENCOUNTER — Encounter (HOSPITAL_COMMUNITY): Payer: Self-pay

## 2012-06-18 ENCOUNTER — Encounter (HOSPITAL_COMMUNITY)
Admission: RE | Admit: 2012-06-18 | Discharge: 2012-06-18 | Disposition: A | Discharge status: Home / Self Care | Payer: Medicare Other | Source: Ambulatory Visit | Attending: Orthopedic Surgery | Admitting: Orthopedic Surgery

## 2012-06-18 ENCOUNTER — Ambulatory Visit (HOSPITAL_COMMUNITY)
Admission: RE | Admit: 2012-06-18 | Discharge: 2012-06-18 | Disposition: A | Discharge status: Home / Self Care | Payer: Medicare Other | Source: Ambulatory Visit | Attending: Orthopedic Surgery | Admitting: Orthopedic Surgery

## 2012-06-18 ENCOUNTER — Encounter (HOSPITAL_COMMUNITY): Payer: Self-pay

## 2012-06-18 DIAGNOSIS — J3489 Other specified disorders of nose and nasal sinuses: Secondary | ICD-10-CM | POA: Insufficient documentation

## 2012-06-18 DIAGNOSIS — I4891 Unspecified atrial fibrillation: Secondary | ICD-10-CM | POA: Insufficient documentation

## 2012-06-18 DIAGNOSIS — Z01812 Encounter for preprocedural laboratory examination: Secondary | ICD-10-CM | POA: Insufficient documentation

## 2012-06-18 DIAGNOSIS — R0602 Shortness of breath: Secondary | ICD-10-CM | POA: Insufficient documentation

## 2012-06-18 DIAGNOSIS — X58XXXA Exposure to other specified factors, initial encounter: Secondary | ICD-10-CM | POA: Insufficient documentation

## 2012-06-18 DIAGNOSIS — S43429A Sprain of unspecified rotator cuff capsule, initial encounter: Secondary | ICD-10-CM | POA: Insufficient documentation

## 2012-06-18 DIAGNOSIS — Z01818 Encounter for other preprocedural examination: Secondary | ICD-10-CM | POA: Insufficient documentation

## 2012-06-18 DIAGNOSIS — R05 Cough: Secondary | ICD-10-CM | POA: Insufficient documentation

## 2012-06-18 DIAGNOSIS — R059 Cough, unspecified: Secondary | ICD-10-CM | POA: Insufficient documentation

## 2012-06-18 HISTORY — DX: Hyperlipidemia, unspecified: E78.5

## 2012-06-18 HISTORY — DX: Depression, unspecified: F32.A

## 2012-06-18 HISTORY — DX: Major depressive disorder, single episode, unspecified: F32.9

## 2012-06-18 LAB — CBC
MCV: 94.4 fL (ref 78.0–100.0)
Platelets: 308 10*3/uL (ref 150–400)
RBC: 4.5 MIL/uL (ref 3.87–5.11)
WBC: 6.9 10*3/uL (ref 4.0–10.5)

## 2012-06-18 LAB — COMPREHENSIVE METABOLIC PANEL
ALT: 16 U/L (ref 0–35)
AST: 22 U/L (ref 0–37)
Albumin: 3.8 g/dL (ref 3.5–5.2)
Alkaline Phosphatase: 108 U/L (ref 39–117)
BUN: 15 mg/dL (ref 6–23)
CO2: 30 mEq/L (ref 19–32)
Calcium: 9.9 mg/dL (ref 8.4–10.5)
Chloride: 101 mEq/L (ref 96–112)
Creatinine, Ser: 0.6 mg/dL (ref 0.50–1.10)
GFR calc Af Amer: >90 mL/min (ref >90)
GFR calc non Af Amer: 83 mL/min — ABNORMAL LOW (ref >90)
Glucose, Bld: 60 mg/dL — ABNORMAL LOW (ref 70–99)
Potassium: 4.2 mEq/L (ref 3.5–5.1)
Sodium: 138 mEq/L (ref 135–145)
Total Bilirubin: 0.8 mg/dL (ref 0.3–1.2)
Total Protein: 7 g/dL (ref 6.0–8.3)

## 2012-06-18 LAB — URINALYSIS, ROUTINE W REFLEX MICROSCOPIC
Bilirubin Urine: NEGATIVE
Glucose, UA: NEGATIVE mg/dL
Hgb urine dipstick: NEGATIVE
Ketones, ur: NEGATIVE mg/dL
Nitrite: NEGATIVE
Protein, ur: NEGATIVE mg/dL
Specific Gravity, Urine: 1.022 (ref 1.005–1.030)
Urobilinogen, UA: 0.2 mg/dL (ref 0.0–1.0)
pH: 7.5 (ref 5.0–8.0)

## 2012-06-18 LAB — PROTIME-INR
INR: 2.1 — ABNORMAL HIGH (ref 0.00–1.49)
Prothrombin Time: 22.7 seconds — ABNORMAL HIGH (ref 11.6–15.2)

## 2012-06-18 LAB — APTT: aPTT: 47 seconds — ABNORMAL HIGH (ref 24–37)

## 2012-06-18 LAB — URINE MICROSCOPIC-ADD ON

## 2012-06-18 NOTE — Patient Instructions (Signed)
YOUR SURGERY IS SCHEDULED AT [facility]  ON:   Thursday  4/17  REPORT TO [facility]AY [facility] AT:  10:00 AM      PHONE # FOR SHORT STAY IS [phone]  DO NOT EAT OR DRINK ANYTHING AFTER MIDNIGHT THE NIGHT BEFORE YOUR SURGERY.  YOU MAY BRUSH YOUR TEETH, RINSE OUT YOUR MOUTH--BUT NO WATER, NO FOOD, NO CHEWING GUM, NO MINTS, NO CANDIES, NO CHEWING TOBACCO.  PLEASE TAKE THE FOLLOWING MEDICATIONS THE AM OF YOUR SURGERY WITH A FEW SIPS OF WATER:  ESCITALOPRAM, FLECAINIDE, OMEPRAOLE    DO NOT BRING VALUABLES, MONEY, CREDIT CARDS.  DO NOT WEAR JEWELRY, MAKE-UP, NAIL POLISH AND NO METAL PINS OR CLIPS IN YOUR HAIR. CONTACT LENS, DENTURES / PARTIALS, GLASSES SHOULD NOT BE WORN TO SURGERY AND IN MOST CASES-HEARING AIDS WILL NEED TO BE REMOVED.  BRING YOUR GLASSES CASE, ANY EQUIPMENT NEEDED FOR YOUR CONTACT LENS. FOR PATIENTS ADMITTED TO THE HOSPITAL--CHECK OUT TIME THE DAY OF DISCHARGE IS 11:00 AM.  ALL INPATIENT ROOMS ARE PRIVATE - WITH BATHROOM, TELEPHONE, TELEVISION AND WIFI INTERNET.                               PLEASE READ OVER ANY  FACT SHEETS THAT YOU WERE GIVEN: MRSA INFORMATION, INCENTIVE SPIROMETER INFORMATION. FAILURE TO FOLLOW THESE INSTRUCTIONS MAY RESULT IN THE CANCELLATION OF YOUR SURGERY.   PATIENT SIGNATURE_________________________________

## 2012-06-18 NOTE — Pre-Procedure Instructions (Signed)
CARDIOLOGY CLEARANCE FOR SURGERY, OFFICE NOTE, EKG FROM DR. H. SMITH 06/11/12 ON PT'S CHART. CXR WAS DONE TODAY AT [facility]

## 2012-06-18 NOTE — H&P (Signed)
Ann Rodriguez is an 79 y.o. female.   Chief Complaint: left shoulder pain HPI: The patient is a 79 year old female who presents with shoulder complaints. They are right handed and present today reporting pain at the left shoulder that began one month ago. The patient reports that the shoulder symptoms began following a specific injury. The injury resulted from a fall onto the shoulder. The patient reports symptoms which include shoulder pain and decreased range of motion. The patient reports symptoms that radiate to the left upper arm. The patient describes these symptoms as moderate in severity. Symptoms are exacerbated by motion at the shoulder. She fell outside [employer] and injured her left shoulder. Following that she was able to drive. Her daughter drove her to [place] to a wedding. She had a second fall there but did not injure her left shoulder. She came in with chief complaint of inability to completely raise her left shoulder from her side.MRI revealed torn left rotator cuff tendon.  Past Medical History  Diagnosis Date  . Stress fracture of right foot 12/06/2010    NO PROBLEM NOW  . Anxiety   . GERD (gastroesophageal reflux disease)   . Arthritis   . Hypertension     per Dr Katrinka Blazing OV note,/ ESSENTIAL  . Warfarin anticoagulation   . Pain     LEFT KNEE - SINCE A FALL ABOUT May 15, 2012 -- STATES XRAYS SHOWED LEFT KNEE REPLACEMENT WAS OK - BUT PT STILL HAVING PAIN AND SWELLING  . Pain     LEFT SHOULDER PAIN - SINCE FALL May 15, 2012 - TORE ROTATOR CUFF  . Arrhythmia     ATRIAL FIB- RHYTHM CONTROL WITH FLECAINIDE.  CHRONIC WARFARIN  . A-fib   . Hyperlipidemia   . Depression     PT'S HUSBAND AND SON HAVE DIED W/IN PAST YEAR 2011/08/13  . Stroke 1/13    PER CT- age related atrophy with mild small vessel ischemic change- no defecits AND PT STATES SHE WAS NEVER AWARE OF HAVING HAD STROKE    Past Surgical History  Procedure Laterality Date  . Bunionectomy      left and right with  hammer toe repair  . Appendectomy    . Oophorectomy    . Rotator cuff repair  1/13,  3/13    x 3  right/  . Foot surgery    . Salpingectomy    . Bladder surgery    . Nasal sinus surgery    . Knee arthroscopy  02/09/2011    Procedure: ARTHROSCOPY KNEE;  Surgeon: Jacki Cones;  Location: [facility];  Service: Orthopedics;  Laterality: Left;  Left Knee Arthroscopy with Menisectomy medial, abrasion chondroplasty medial, and synovectomy, supra patella pouch.  . Back surgery      lumbar  . Eye surgery      bilateral cataract extraction with IOL  . Total knee arthroplasty  09/05/2011    Procedure: TOTAL KNEE ARTHROPLASTY;  Surgeon: Jacki Cones, MD;  Location: [facility];  Service: Orthopedics;  Laterality: Left;    Family History  Problem Relation Age of Onset  . Diabetes Mother   . Diabetes Son    Social History:  reports that she quit smoking about 23 years ago. Her smoking use included Cigarettes. She has a 5 pack-year smoking history. She does not have any smokeless tobacco history on file. She reports that she does not drink alcohol or use illicit drugs.  Allergies: No Known Allergies   Current outpatient  prescriptions: aspirin 81 MG tablet, Take 81 mg by mouth daily., Disp: , Rfl: ;   atorvastatin (LIPITOR) 40 MG tablet, Take 40 mg by mouth at bedtime., Disp: , Rfl: ;   beta carotene w/minerals (OCUVITE) tablet, Take 1 tablet by mouth daily with breakfast. , Disp: , Rfl: ;  calcium carbonate (OS-CAL) 600 MG TABS, Take 1,800 mg by mouth daily with breakfast. , Disp: , Rfl:  cholecalciferol (VITAMIN D) 1000 UNITS tablet, Take 2,000 Units by mouth 2 (two) times daily. , Disp: , Rfl: ;  escitalopram (LEXAPRO) 5 MG tablet, Take 5 mg by mouth every morning., Disp: , Rfl: ;  flecainide (TAMBOCOR) 100 MG tablet, Take 100 mg by mouth 2 (two) times daily. 100 mg  AM and nightly,   50mg   midday, Disp: , Rfl:  oxymetazoline (AFRIN) 0.05 % nasal spray, Place 1 spray into the nose 2 (two) times  daily as needed. ALLERGIES, Disp: , Rfl: ;   polyethylene glycol (MIRALAX / GLYCOLAX) packet, Take 17 g by mouth daily with breakfast., Disp: , Rfl: ;   warfarin (COUMADIN) 5 MG tablet, Take 2.5-5 mg by mouth daily. Takes 2.5 on Tues, Thurs, Sat & 5 mg on Sun, Mon, Wed, Fri., Disp: , Rfl:  OMEPRAZOLE PO, Take by mouth. TAKES ONE EVERY AM - STATES IT IS OVER THE COUNTER, Disp: , Rfl: ;   oxyCODONE-acetaminophen (PERCOCET) 5-325 MG per tablet, Take 1 tablet by mouth every 4 (four) hours as needed for pain (PT STATES HER RX SAYS EVERY 4 TO 6 HOURS PRN)., Disp: , Rfl:   Results for orders placed during the hospital encounter of 06/18/12 (from the past 48 hour(s))  CBC     Status: None   Collection Time    06/18/12  9:55 AM      Result Value Range   WBC 6.9  4.0 - 10.5 K/uL   RBC 4.50  3.87 - 5.11 MIL/uL   Hemoglobin 13.8  12.0 - 15.0 g/dL   HCT 40.9  81.1 - 91.4 %   MCV 94.4  78.0 - 100.0 fL   MCH 30.7  26.0 - 34.0 pg   MCHC 32.5  30.0 - 36.0 g/dL   RDW 78.2  95.6 - 21.3 %   Platelets 308  150 - 400 K/uL  APTT     Status: Abnormal   Collection Time    06/18/12  9:55 AM      Result Value Range   aPTT 47 (*) 24 - 37 seconds   Comment:            IF BASELINE aPTT IS ELEVATED,     SUGGEST PATIENT RISK ASSESSMENT     BE USED TO DETERMINE APPROPRIATE     ANTICOAGULANT THERAPY.  COMPREHENSIVE METABOLIC PANEL     Status: Abnormal   Collection Time    06/18/12  9:55 AM      Result Value Range   Sodium 138  135 - 145 mEq/L   Potassium 4.2  3.5 - 5.1 mEq/L   Chloride 101  96 - 112 mEq/L   CO2 30  19 - 32 mEq/L   Glucose, Bld 60 (*) 70 - 99 mg/dL   BUN 15  6 - 23 mg/dL   Creatinine, Ser 0.86  0.50 - 1.10 mg/dL   Calcium 9.9  8.4 - 57.8 mg/dL   Total Protein 7.0  6.0 - 8.3 g/dL   Albumin 3.8  3.5 - 5.2 g/dL   AST 22  0 - 37 U/L   ALT 16  0 - 35 U/L   Alkaline Phosphatase 108  39 - 117 U/L   Total Bilirubin 0.8  0.3 - 1.2 mg/dL   GFR calc non Af Amer 83 (*) >90 mL/min   GFR calc Af  Amer >90  >90 mL/min   Comment:            The eGFR has been calculated     using the CKD EPI equation.     This calculation has not been     validated in all clinical     situations.     eGFR's persistently     <90 mL/min signify     possible Chronic Kidney Disease.  PROTIME-INR     Status: Abnormal   Collection Time    06/18/12  9:55 AM      Result Value Range   Prothrombin Time 22.7 (*) 11.6 - 15.2 seconds   INR 2.10 (*) 0.00 - 1.49  URINALYSIS, ROUTINE W REFLEX MICROSCOPIC     Status: Abnormal   Collection Time    06/18/12 10:00 AM      Result Value Range   Color, Urine YELLOW  YELLOW   APPearance CLEAR  CLEAR   Specific Gravity, Urine 1.022  1.005 - 1.030   pH 7.5  5.0 - 8.0   Glucose, UA NEGATIVE  NEGATIVE mg/dL   Hgb urine dipstick NEGATIVE  NEGATIVE   Bilirubin Urine NEGATIVE  NEGATIVE   Ketones, ur NEGATIVE  NEGATIVE mg/dL   Protein, ur NEGATIVE  NEGATIVE mg/dL   Urobilinogen, UA 0.2  0.0 - 1.0 mg/dL   Nitrite NEGATIVE  NEGATIVE   Leukocytes, UA MODERATE (*) NEGATIVE  URINE MICROSCOPIC-ADD ON     Status: Abnormal   Collection Time    06/18/12 10:00 AM      Result Value Range   Squamous Epithelial / LPF RARE  RARE   WBC, UA 3-6  <3 WBC/hpf   Bacteria, UA FEW (*) RARE   Dg Chest 2 View  06/18/2012  *RADIOLOGY REPORT*  Clinical Data: Preop left shoulder rotator cuff repair  CHEST - 2 VIEW  Comparison: 07/24/2011  Findings: Chronic interstitial markings.  No focal consolidation. No pleural effusion or pneumothorax.  The heart is normal in size.  Degenerative changes of the visualized thoracolumbar spine.  IMPRESSION: No evidence of acute cardiopulmonary disease.   Original Report Authenticated By: Charline Bills, M.D.     Review of Systems  Constitutional: Negative.   HENT: Negative.  Negative for neck pain.   Eyes: Negative.   Respiratory: Positive for shortness of breath. Negative for cough, hemoptysis, sputum production and wheezing.        SOB with exertion    Cardiovascular: Negative.   Gastrointestinal: Negative.   Genitourinary: Negative.   Musculoskeletal: Positive for joint pain and falls. Negative for myalgias and back pain.       Left shoulder pain   Skin: Negative.   Neurological: Negative.   Endo/Heme/Allergies: Negative.   Psychiatric/Behavioral: Negative.    Vitals Weight: 142 lb Height: 65 in Body Surface Area: 1.72 m Body Mass Index: 23.63 kg/m Pulse: 76 (Regular) BP: 134/79 (Sitting, Left Arm, Standard)  Physical Exam  Constitutional: She is oriented to person, place, and time. She appears well-developed and well-nourished. No distress.  HENT:  Head: Normocephalic and atraumatic.  Right Ear: External ear normal.  Left Ear: External ear normal.  Nose: Nose normal.  Mouth/Throat: Oropharynx is clear and  moist.  Eyes: Conjunctivae and EOM are normal.  Neck: Normal range of motion. Neck supple. No tracheal deviation present. No thyromegaly present.  Cardiovascular: Normal rate, regular rhythm, normal heart sounds and intact distal pulses.   No murmur heard. Respiratory: Effort normal. No respiratory distress. She has no wheezes. She exhibits no tenderness.  GI: Soft. Bowel sounds are normal. She exhibits no distension and no mass. There is no tenderness.  Musculoskeletal:       Right shoulder: Normal.       Left shoulder: She exhibits decreased range of motion, tenderness, pain and decreased strength.       Right elbow: Normal.      Left elbow: Normal.       Arms: Lymphadenopathy:    She has no cervical adenopathy.  Neurological: She is alert and oriented to person, place, and time. She has normal reflexes. No sensory deficit.  Skin: No rash noted. She is not diaphoretic. No erythema.  Psychiatric: She has a normal mood and affect. Her behavior is normal.     Assessment/Plan Left shoulder rotator cuff tear She needs an open rotator cuff repair with possible use of graft and anchors. We may or may not  need to use a graft material that is made from calf skin. This is approved by the FDA and I have not had a rejection of that graft yet. Also, we may need to use anchors. These are polyethylene anchors that stay in the bone and we use those anchors to suture the tendon down in certain cases where the tendon is completely pulled off the bone. There is always a chance of a secondary infection obviously with any surgery but we do use antibiotics preop.Risks and benefits of the surgery were discussed.   Sheika Coutts LAUREN 06/18/2012, 11:49 AM

## 2012-06-19 LAB — URINE CULTURE: Colony Count: NO GROWTH

## 2012-06-19 NOTE — Pre-Procedure Instructions (Signed)
PT'S PREOP UA, URINE MICROSCOPIC AND CULTURE IN PROCESS REPORTS AND PT, PTT REPORTS FAXED TO DR. GIOFFRE'S OFFICE.  ORDER IS IN EPIC TO REPEAT PT, INR DAY OF SURGERY

## 2012-06-19 NOTE — Pre-Procedure Instructions (Signed)
FAXED NOTE RECEIVED FROM DR. GIOFFRE'S OFFICE THAT PT WAS CALLED TO TAKE CIPRO

## 2012-06-23 NOTE — Pre-Procedure Instructions (Signed)
Pt's urine culture result "no growth" faxed to Dr. Jeannetta Ellis office

## 2012-06-26 ENCOUNTER — Encounter (HOSPITAL_COMMUNITY): Payer: Self-pay

## 2012-06-26 ENCOUNTER — Ambulatory Visit (HOSPITAL_COMMUNITY): Payer: Medicare Other | Admitting: Anesthesiology

## 2012-06-26 ENCOUNTER — Encounter (HOSPITAL_COMMUNITY)
Admission: RE | Disposition: A | Discharge status: Home / Self Care | Payer: Self-pay | Source: Ambulatory Visit | Attending: Orthopedic Surgery

## 2012-06-26 ENCOUNTER — Observation Stay (HOSPITAL_COMMUNITY)
Admission: RE | Admit: 2012-06-26 | Discharge: 2012-06-27 | Disposition: A | Discharge status: Home / Self Care | Payer: Medicare Other | Source: Ambulatory Visit | Attending: Orthopedic Surgery | Admitting: Orthopedic Surgery

## 2012-06-26 ENCOUNTER — Encounter (HOSPITAL_COMMUNITY): Payer: Self-pay | Admitting: Anesthesiology

## 2012-06-26 DIAGNOSIS — I10 Essential (primary) hypertension: Secondary | ICD-10-CM | POA: Insufficient documentation

## 2012-06-26 DIAGNOSIS — Z8673 Personal history of transient ischemic attack (TIA), and cerebral infarction without residual deficits: Secondary | ICD-10-CM | POA: Insufficient documentation

## 2012-06-26 DIAGNOSIS — M12219 Villonodular synovitis (pigmented), unspecified shoulder: Secondary | ICD-10-CM | POA: Diagnosis present

## 2012-06-26 DIAGNOSIS — M19019 Primary osteoarthritis, unspecified shoulder: Secondary | ICD-10-CM | POA: Diagnosis present

## 2012-06-26 DIAGNOSIS — M659 Unspecified synovitis and tenosynovitis, unspecified site: Secondary | ICD-10-CM | POA: Insufficient documentation

## 2012-06-26 DIAGNOSIS — Z7901 Long term (current) use of anticoagulants: Secondary | ICD-10-CM | POA: Insufficient documentation

## 2012-06-26 DIAGNOSIS — X58XXXA Exposure to other specified factors, initial encounter: Secondary | ICD-10-CM | POA: Insufficient documentation

## 2012-06-26 DIAGNOSIS — K219 Gastro-esophageal reflux disease without esophagitis: Secondary | ICD-10-CM | POA: Insufficient documentation

## 2012-06-26 DIAGNOSIS — I4891 Unspecified atrial fibrillation: Secondary | ICD-10-CM | POA: Insufficient documentation

## 2012-06-26 DIAGNOSIS — M19039 Primary osteoarthritis, unspecified wrist: Principal | ICD-10-CM | POA: Insufficient documentation

## 2012-06-26 DIAGNOSIS — S43429A Sprain of unspecified rotator cuff capsule, initial encounter: Secondary | ICD-10-CM

## 2012-06-26 DIAGNOSIS — E785 Hyperlipidemia, unspecified: Secondary | ICD-10-CM | POA: Insufficient documentation

## 2012-06-26 HISTORY — PX: SHOULDER OPEN ROTATOR CUFF REPAIR: SHX2407

## 2012-06-26 LAB — PROTIME-INR
INR: 0.9 (ref 0.00–1.49)
Prothrombin Time: 12.1 seconds (ref 11.6–15.2)

## 2012-06-26 SURGERY — REPAIR, ROTATOR CUFF, OPEN
Anesthesia: General | Site: Shoulder | Laterality: Left | Wound class: Clean

## 2012-06-26 MED ORDER — POLYETHYLENE GLYCOL 3350 17 G PO PACK
17.0000 g | PACK | Freq: Every day | ORAL | Status: DC
  Administered 2012-06-27: 17 g via ORAL

## 2012-06-26 MED ORDER — OXYCODONE-ACETAMINOPHEN 5-325 MG PO TABS: 1.0000 | ORAL_TABLET | ORAL | Status: DC | PRN

## 2012-06-26 MED ORDER — ONDANSETRON HCL 4 MG/2ML IJ SOLN
INTRAMUSCULAR | Status: DC | PRN
  Administered 2012-06-26: 4 mg via INTRAVENOUS

## 2012-06-26 MED ORDER — CEFAZOLIN SODIUM 1-5 GM-% IV SOLN
1.0000 g | Freq: Four times a day (QID) | INTRAVENOUS | Status: AC
  Administered 2012-06-26 – 2012-06-27 (×3): 1 g via INTRAVENOUS
  Filled 2012-06-26 (×4): qty 50

## 2012-06-26 MED ORDER — PHENYLEPHRINE HCL 10 MG/ML IJ SOLN
10.0000 mg | INTRAVENOUS | Status: DC | PRN
  Administered 2012-06-26: 50 ug/min via INTRAVENOUS

## 2012-06-26 MED ORDER — PHENYLEPHRINE HCL 10 MG/ML IJ SOLN
INTRAMUSCULAR | Status: DC | PRN
  Administered 2012-06-26 (×2): 80 ug via INTRAVENOUS

## 2012-06-26 MED ORDER — ACETAMINOPHEN 10 MG/ML IV SOLN
INTRAVENOUS | Status: AC
  Filled 2012-06-26: qty 100

## 2012-06-26 MED ORDER — HYDROMORPHONE HCL PF 1 MG/ML IJ SOLN
0.5000 mg | INTRAMUSCULAR | Status: DC | PRN
Start: 2012-06-26 — End: 2012-06-27
  Administered 2012-06-27: 0.5 mg via INTRAVENOUS
  Administered 2012-06-27: 1 mg via INTRAVENOUS
  Administered 2012-06-27: 0.5 mg via INTRAVENOUS
  Filled 2012-06-26 (×3): qty 1

## 2012-06-26 MED ORDER — CEFAZOLIN SODIUM-DEXTROSE 2-3 GM-% IV SOLR
INTRAVENOUS | Status: AC
  Filled 2012-06-26: qty 50

## 2012-06-26 MED ORDER — ATORVASTATIN CALCIUM 40 MG PO TABS
40.0000 mg | ORAL_TABLET | Freq: Every day | ORAL | Status: DC
  Administered 2012-06-26: 40 mg via ORAL
  Filled 2012-06-26 (×2): qty 1

## 2012-06-26 MED ORDER — METOCLOPRAMIDE HCL 10 MG PO TABS: 5.0000 mg | ORAL_TABLET | Freq: Three times a day (TID) | ORAL | Status: DC | PRN

## 2012-06-26 MED ORDER — ONDANSETRON HCL 4 MG/2ML IJ SOLN
INTRAMUSCULAR | Status: AC
  Administered 2012-06-26: 4 mg via INTRAVENOUS
  Filled 2012-06-26: qty 2

## 2012-06-26 MED ORDER — FLECAINIDE ACETATE 100 MG PO TABS
100.0000 mg | ORAL_TABLET | Freq: Two times a day (BID) | ORAL | Status: DC
  Administered 2012-06-26 – 2012-06-27 (×2): 100 mg via ORAL
  Filled 2012-06-26 (×3): qty 1

## 2012-06-26 MED ORDER — MENTHOL 3 MG MT LOZG
1.0000 | LOZENGE | OROMUCOSAL | Status: DC | PRN
  Filled 2012-06-26 (×2): qty 9

## 2012-06-26 MED ORDER — HYDROMORPHONE HCL PF 1 MG/ML IJ SOLN
INTRAMUSCULAR | Status: AC
  Administered 2012-06-26: 0.5 mg via INTRAVENOUS
  Filled 2012-06-26: qty 1

## 2012-06-26 MED ORDER — GLYCOPYRROLATE 0.2 MG/ML IJ SOLN
INTRAMUSCULAR | Status: DC | PRN
  Administered 2012-06-26: 0.4 mg via INTRAVENOUS

## 2012-06-26 MED ORDER — LACTATED RINGERS IV SOLN
INTRAVENOUS | Status: DC
  Administered 2012-06-26 – 2012-06-27 (×2): via INTRAVENOUS

## 2012-06-26 MED ORDER — HYDROMORPHONE HCL PF 1 MG/ML IJ SOLN
INTRAMUSCULAR | Status: AC
  Filled 2012-06-26: qty 1

## 2012-06-26 MED ORDER — METHOCARBAMOL 500 MG PO TABS
500.0000 mg | ORAL_TABLET | Freq: Four times a day (QID) | ORAL | Status: DC | PRN
  Administered 2012-06-26 – 2012-06-27 (×2): 500 mg via ORAL
  Filled 2012-06-26 (×2): qty 1

## 2012-06-26 MED ORDER — ONDANSETRON HCL 4 MG PO TABS: 4.0000 mg | ORAL_TABLET | Freq: Four times a day (QID) | ORAL | Status: DC | PRN

## 2012-06-26 MED ORDER — ACETAMINOPHEN 650 MG RE SUPP: 650.0000 mg | Freq: Four times a day (QID) | RECTAL | Status: DC | PRN

## 2012-06-26 MED ORDER — FENTANYL CITRATE 0.05 MG/ML IJ SOLN
INTRAMUSCULAR | Status: DC | PRN
  Administered 2012-06-26: 50 ug via INTRAVENOUS
  Administered 2012-06-26: 25 ug via INTRAVENOUS
  Administered 2012-06-26: 100 ug via INTRAVENOUS
  Administered 2012-06-26: 25 ug via INTRAVENOUS

## 2012-06-26 MED ORDER — PHENOL 1.4 % MT LIQD
1.0000 | OROMUCOSAL | Status: DC | PRN
  Filled 2012-06-26: qty 177

## 2012-06-26 MED ORDER — ONDANSETRON HCL 4 MG/2ML IJ SOLN: 4.0000 mg | Freq: Four times a day (QID) | INTRAMUSCULAR | Status: DC | PRN

## 2012-06-26 MED ORDER — BUPIVACAINE LIPOSOME 1.3 % IJ SUSP
20.0000 mL | Freq: Once | INTRAMUSCULAR | Status: DC
  Filled 2012-06-26: qty 20

## 2012-06-26 MED ORDER — PANTOPRAZOLE SODIUM 40 MG PO TBEC
40.0000 mg | DELAYED_RELEASE_TABLET | Freq: Every day | ORAL | Status: DC
  Administered 2012-06-26 – 2012-06-27 (×2): 40 mg via ORAL
  Filled 2012-06-26 (×2): qty 1

## 2012-06-26 MED ORDER — PROMETHAZINE HCL 25 MG/ML IJ SOLN: 6.2500 mg | INTRAMUSCULAR | Status: DC | PRN

## 2012-06-26 MED ORDER — NEOSTIGMINE METHYLSULFATE 1 MG/ML IJ SOLN
INTRAMUSCULAR | Status: DC | PRN
  Administered 2012-06-26: 3 mg via INTRAVENOUS

## 2012-06-26 MED ORDER — ACETAMINOPHEN 325 MG PO TABS: 650.0000 mg | ORAL_TABLET | Freq: Four times a day (QID) | ORAL | Status: DC | PRN

## 2012-06-26 MED ORDER — BISACODYL 10 MG RE SUPP: 10.0000 mg | Freq: Every day | RECTAL | Status: DC | PRN

## 2012-06-26 MED ORDER — ESCITALOPRAM OXALATE 5 MG PO TABS
5.0000 mg | ORAL_TABLET | Freq: Every morning | ORAL | Status: DC
  Administered 2012-06-27: 5 mg via ORAL
  Filled 2012-06-26: qty 1

## 2012-06-26 MED ORDER — LIDOCAINE HCL (PF) 2 % IJ SOLN
INTRAMUSCULAR | Status: DC | PRN
  Administered 2012-06-26: 20 mg

## 2012-06-26 MED ORDER — FLECAINIDE ACETATE 50 MG PO TABS
50.0000 mg | ORAL_TABLET | ORAL | Status: DC
  Filled 2012-06-26: qty 1

## 2012-06-26 MED ORDER — OXYCODONE-ACETAMINOPHEN 5-325 MG PO TABS
1.0000 | ORAL_TABLET | ORAL | Status: DC | PRN
  Administered 2012-06-26 – 2012-06-27 (×4): 1 via ORAL
  Filled 2012-06-26 (×4): qty 1

## 2012-06-26 MED ORDER — PROPOFOL 10 MG/ML IV BOLUS
INTRAVENOUS | Status: DC | PRN
  Administered 2012-06-26: 120 mg via INTRAVENOUS

## 2012-06-26 MED ORDER — SODIUM CHLORIDE 0.9 % IR SOLN
Status: DC | PRN
  Administered 2012-06-26: 17:00:00

## 2012-06-26 MED ORDER — CEFAZOLIN SODIUM-DEXTROSE 2-3 GM-% IV SOLR
2.0000 g | INTRAVENOUS | Status: AC
  Administered 2012-06-26: 2 g via INTRAVENOUS

## 2012-06-26 MED ORDER — MIDAZOLAM HCL 5 MG/5ML IJ SOLN
INTRAMUSCULAR | Status: DC | PRN
  Administered 2012-06-26 (×2): 1 mg via INTRAVENOUS

## 2012-06-26 MED ORDER — METHOCARBAMOL 500 MG PO TABS: 500.0000 mg | ORAL_TABLET | Freq: Four times a day (QID) | ORAL | Status: DC | PRN

## 2012-06-26 MED ORDER — THROMBIN 5000 UNITS EX SOLR
CUTANEOUS | Status: DC | PRN
  Administered 2012-06-26: 5000 [IU] via TOPICAL

## 2012-06-26 MED ORDER — BUPIVACAINE LIPOSOME 1.3 % IJ SUSP
INTRAMUSCULAR | Status: DC | PRN
  Administered 2012-06-26: 20 mL

## 2012-06-26 MED ORDER — METOCLOPRAMIDE HCL 5 MG/ML IJ SOLN: 5.0000 mg | Freq: Three times a day (TID) | INTRAMUSCULAR | Status: DC | PRN

## 2012-06-26 MED ORDER — FLEET ENEMA 7-19 GM/118ML RE ENEM: 1.0000 | ENEMA | Freq: Once | RECTAL | Status: AC | PRN

## 2012-06-26 MED ORDER — EPHEDRINE SULFATE 50 MG/ML IJ SOLN
INTRAMUSCULAR | Status: DC | PRN
  Administered 2012-06-26: 5 mg via INTRAVENOUS

## 2012-06-26 MED ORDER — HEMOSTATIC AGENTS (NO CHARGE) OPTIME
TOPICAL | Status: DC | PRN
  Administered 2012-06-26: 1 via TOPICAL

## 2012-06-26 MED ORDER — METHOCARBAMOL 100 MG/ML IJ SOLN: 500.0000 mg | Freq: Four times a day (QID) | INTRAVENOUS | Status: DC | PRN

## 2012-06-26 MED ORDER — THROMBIN 5000 UNITS EX SOLR
CUTANEOUS | Status: AC
  Filled 2012-06-26: qty 5000

## 2012-06-26 MED ORDER — ACETAMINOPHEN 10 MG/ML IV SOLN
INTRAVENOUS | Status: DC | PRN
  Administered 2012-06-26: 1000 mg via INTRAVENOUS

## 2012-06-26 MED ORDER — ROCURONIUM BROMIDE 100 MG/10ML IV SOLN
INTRAVENOUS | Status: DC | PRN
  Administered 2012-06-26: 35 mg via INTRAVENOUS

## 2012-06-26 MED ORDER — LACTATED RINGERS IV SOLN
INTRAVENOUS | Status: DC
  Administered 2012-06-26: 18:00:00 via INTRAVENOUS
  Administered 2012-06-26: 1000 mL via INTRAVENOUS

## 2012-06-26 MED ORDER — HYDROMORPHONE HCL PF 1 MG/ML IJ SOLN
0.2500 mg | INTRAMUSCULAR | Status: DC | PRN
  Administered 2012-06-26 (×2): 0.25 mg via INTRAVENOUS
  Administered 2012-06-26: 0.5 mg via INTRAVENOUS
  Administered 2012-06-26 (×3): 0.25 mg via INTRAVENOUS

## 2012-06-26 SURGICAL SUPPLY — 46 items
ANCHOR PEEK ZIP 5.5 NDL NO2 (Orthopedic Implant) ×2 IMPLANT
BAG ZIPLOCK 12X15 (MISCELLANEOUS) IMPLANT
BLADE OSCILLATING/SAGITTAL (BLADE) ×1
BLADE SW THK.38XMED LNG THN (BLADE) ×1 IMPLANT
BNDG COHESIVE 6X5 TAN NS LF (GAUZE/BANDAGES/DRESSINGS) IMPLANT
BUR OVAL CARBIDE 4.0 (BURR) ×2 IMPLANT
CLEANER TIP ELECTROSURG 2X2 (MISCELLANEOUS) ×2 IMPLANT
CLOTH BEACON ORANGE TIMEOUT ST (SAFETY) ×2 IMPLANT
DRAPE POUCH INSTRU U-SHP 10X18 (DRAPES) ×2 IMPLANT
DRSG EMULSION OIL 3X3 NADH (GAUZE/BANDAGES/DRESSINGS) ×2 IMPLANT
DRSG PAD ABDOMINAL 8X10 ST (GAUZE/BANDAGES/DRESSINGS) ×2 IMPLANT
DRSG TEGADERM 2-3/8X2-3/4 SM (GAUZE/BANDAGES/DRESSINGS) ×2 IMPLANT
DURAPREP 26ML APPLICATOR (WOUND CARE) ×2 IMPLANT
ELECT REM PT RETURN 9FT ADLT (ELECTROSURGICAL) ×2
ELECTRODE REM PT RTRN 9FT ADLT (ELECTROSURGICAL) ×1 IMPLANT
GLOVE BIOGEL PI IND STRL 8 (GLOVE) ×1 IMPLANT
GLOVE BIOGEL PI INDICATOR 8 (GLOVE) ×1
GLOVE ECLIPSE 8.0 STRL XLNG CF (GLOVE) ×4 IMPLANT
GLOVE SURG SS PI 6.5 STRL IVOR (GLOVE) ×4 IMPLANT
GLOVE SURG SS PI 7.0 STRL IVOR (GLOVE) ×2 IMPLANT
GOWN PREVENTION PLUS LG XLONG (DISPOSABLE) ×4 IMPLANT
GOWN STRL NON-REIN LRG LVL3 (GOWN DISPOSABLE) ×2 IMPLANT
GOWN STRL REIN 2XL XLG LVL4 (GOWN DISPOSABLE) ×2 IMPLANT
KIT BASIN OR (CUSTOM PROCEDURE TRAY) ×2 IMPLANT
MANIFOLD NEPTUNE II (INSTRUMENTS) ×2 IMPLANT
NEEDLE MA TROC 1/2 (NEEDLE) IMPLANT
NS IRRIG 1000ML POUR BTL (IV SOLUTION) IMPLANT
PACK SHOULDER CUSTOM OPM052 (CUSTOM PROCEDURE TRAY) ×2 IMPLANT
PASSER SUT SWANSON 36MM LOOP (INSTRUMENTS) IMPLANT
POSITIONER SURGICAL ARM (MISCELLANEOUS) ×2 IMPLANT
SLING ARM IMMOBILIZER LRG (SOFTGOODS) ×2 IMPLANT
SPONGE GAUZE 4X4 12PLY (GAUZE/BANDAGES/DRESSINGS) ×2 IMPLANT
SPONGE SURGIFOAM ABS GEL 100 (HEMOSTASIS) ×2 IMPLANT
STAPLER VISISTAT 35W (STAPLE) IMPLANT
STRIP CLOSURE SKIN 1/2X4 (GAUZE/BANDAGES/DRESSINGS) ×2 IMPLANT
SUCTION FRAZIER 12FR DISP (SUCTIONS) ×2 IMPLANT
SUT BONE WAX W31G (SUTURE) ×2 IMPLANT
SUT ETHIBOND NAB CT1 #1 30IN (SUTURE) ×2 IMPLANT
SUT MNCRL AB 4-0 PS2 18 (SUTURE) ×2 IMPLANT
SUT VIC AB 0 CT1 27 (SUTURE) ×1
SUT VIC AB 0 CT1 27XBRD ANTBC (SUTURE) ×1 IMPLANT
SUT VIC AB 1 CT1 27 (SUTURE) ×2
SUT VIC AB 1 CT1 27XBRD ANTBC (SUTURE) ×2 IMPLANT
SUT VIC AB 2-0 CT1 27 (SUTURE) ×1
SUT VIC AB 2-0 CT1 27XBRD (SUTURE) ×1 IMPLANT
TOWEL OR 17X26 10 PK STRL BLUE (TOWEL DISPOSABLE) ×4 IMPLANT

## 2012-06-26 NOTE — Anesthesia Postprocedure Evaluation (Signed)
  Anesthesia Post-op Note  Patient: Ann Rodriguez  Procedure(s) Performed: Procedure(s) (LRB): LEFT SHOULDER ROTATOR CUFF REPAIR WITH ANCHORS  (Left)  Patient Location: PACU  Anesthesia Type: General  Level of Consciousness: awake and alert   Airway and Oxygen Therapy: Patient Spontanous Breathing  Post-op Pain: mild  Post-op Assessment: Post-op Vital signs reviewed, Patient's Cardiovascular Status Stable, Respiratory Function Stable, Patent Airway and No signs of Nausea or vomiting  Last Vitals:  Filed Vitals:   06/26/12 1800  BP: 115/56  Pulse: 66  Temp: 37.1 C  Resp: 16    Post-op Vital Signs: stable   Complications: No apparent anesthesia complications

## 2012-06-26 NOTE — Brief Op Note (Signed)
06/26/2012  5:11 PM  PATIENT:  Ann Rodriguez  79 y.o. female  PRE-OPERATIVE DIAGNOSIS:  LEFT SHOULDER ROTATOR CUFF TEAR .Complex with severe Synovitis and AC Joint Arthritis.  POST-OPERATIVE DIAGNOSIS:  LEFT SHOULDER ROTATOR CUFF TEAR ,Complex with severe Synovitis and AC Joint Arthritis.  PROCEDURE:  Procedure(s): LEFT SHOULDER ROTATOR CUFF REPAIR WITH ANCHORS  (Left) and Extensive Synovectomy and resection of distal Clavicle and Acromionectomy.  SURGEON:  Surgeon(s) and Role:    * Jacki Cones, MD - Primary  PHYSICIAN ASSISTANT: Dimitri Ped PA  ASSISTANTS: Dimitri Ped PA   ANESTHESIA:   general  EBL:     BLOOD ADMINISTERED:none  DRAINS: none   LOCAL MEDICATIONS USED:  BUPIVICAINE 20cc.   SPECIMEN:  No Specimen  DISPOSITION OF SPECIMEN:  N/A  COUNTS:  YES  TOURNIQUET:  * No tourniquets in log *  DICTATION: .Other Dictation: Dictation Number [id number]  PLAN OF CARE: Admit for overnight observation  PATIENT DISPOSITION:  Stable in OR   Delay start of Pharmacological VTE agent (>24hrs) due to surgical blood loss or risk of bleeding: yes

## 2012-06-26 NOTE — Transfer of Care (Signed)
Immediate Anesthesia Transfer of Care Note  Patient: Ann Rodriguez  Procedure(s) Performed: Procedure(s): LEFT SHOULDER ROTATOR CUFF REPAIR WITH ANCHORS  (Left)  Patient Location: PACU  Anesthesia Type:General  Level of Consciousness: awake, alert , oriented and patient cooperative  Airway & Oxygen Therapy: Patient Spontanous Breathing and Patient connected to face mask oxygen  Post-op Assessment: Report given to PACU RN and Post -op Vital signs reviewed and stable  Post vital signs: Reviewed and stable  Complications: No apparent anesthesia complications

## 2012-06-26 NOTE — Anesthesia Preprocedure Evaluation (Signed)
Anesthesia Evaluation  Patient identified by MRN, date of birth, ID band Patient awake    Reviewed: Allergy & Precautions, H&P , NPO status , Patient's Chart, lab work & pertinent test results, Unable to perform ROS - Chart review only  Airway Mallampati: II TM Distance: >3 FB Neck ROM: Full    Dental no notable dental hx.    Pulmonary neg pulmonary ROS, former smoker,  breath sounds clear to auscultation  Pulmonary exam normal       Cardiovascular Exercise Tolerance: Good hypertension, negative cardio ROS  + dysrhythmias Atrial Fibrillation Rhythm:Regular Rate:Normal     Neuro/Psych PSYCHIATRIC DISORDERS Anxiety Depression CVA negative neurological ROS  negative psych ROS   GI/Hepatic negative GI ROS, Neg liver ROS, GERD-  Medicated,  Endo/Other  negative endocrine ROS  Renal/GU negative Renal ROS  negative genitourinary   Musculoskeletal negative musculoskeletal ROS (+)   Abdominal   Peds negative pediatric ROS (+)  Hematology negative hematology ROS (+)   Anesthesia Other Findings   Reproductive/Obstetrics negative OB ROS                           Anesthesia Physical Anesthesia Plan  ASA: III  Anesthesia Plan: General   Post-op Pain Management:    Induction: Intravenous  Airway Management Planned: Oral ETT  Additional Equipment:   Intra-op Plan:   Post-operative Plan: Extubation in OR  Informed Consent: I have reviewed the patients History and Physical, chart, labs and discussed the procedure including the risks, benefits and alternatives for the proposed anesthesia with the patient or authorized representative who has indicated his/her understanding and acceptance.   Dental advisory given  Plan Discussed with: CRNA  Anesthesia Plan Comments:         Anesthesia Quick Evaluation

## 2012-06-26 NOTE — Interval H&P Note (Signed)
History and Physical Interval Note:  06/26/2012 3:25 PM  Ann Rodriguez  has presented today for surgery, with the diagnosis of LEFT SHOULDER ROTATOR CUFF TEAR   The various methods of treatment have been discussed with the patient and family. After consideration of risks, benefits and other options for treatment, the patient has consented to  Procedure(s): LEFT SHOULDER ROTATOR CUFF REPAIR WITH GRAFT AND ANCHORS  (Left) as a surgical intervention .  The patient's history has been reviewed, patient examined, no change in status, stable for surgery.  I have reviewed the patient's chart and labs.  Questions were answered to the patient's satisfaction.     Traci Plemons A

## 2012-06-27 ENCOUNTER — Encounter (HOSPITAL_COMMUNITY): Payer: Self-pay | Admitting: Orthopedic Surgery

## 2012-06-27 NOTE — Evaluation (Signed)
Occupational Therapy Evaluation Patient Details Name: Ann Rodriguez MRN: [id number] DOB: Jan 22, 1929 Today's Date: 06/27/2012 Time: 1478-2956 OT Time Calculation (min): 34 min  OT Assessment / Plan / Recommendation Clinical Impression  Pt is s/p R shoulder surgery. She is doing well and all education completed with pt. Daughter can help at home.    OT Assessment  Patient does not need any further OT services    Follow Up Recommendations  No OT follow up;Supervision/Assistance - 24 hour    Barriers to Discharge      Equipment Recommendations  None recommended by OT    Recommendations for Other Services    Frequency       Precautions / Restrictions Precautions Precautions: Shoulder Precaution Comments: Issued shoulder care handout and reviewed all information with pt Required Braces or Orthoses: Other Brace/Splint Other Brace/Splint: right shoulder sling Restrictions Weight Bearing Restrictions: Yes RUE Weight Bearing: Non weight bearing        ADL  ADL Comments: Overall mod assist for UB bathing and dressing. Daughter can assist    OT Diagnosis:    OT Problem List:   OT Treatment Interventions:     OT Goals    Visit Information  Last OT Received On: 06/27/12 Assistance Needed: +1    Subjective Data  Subjective: I had three surgeries on my right shoulder  Patient Stated Goal: home   Prior Functioning     Home Living Lives With: Alone Available Help at Discharge: Family (daughter can help) Prior Function Level of Independence: Independent         Vision/Perception     Cognition       Extremity/Trunk Assessment       Mobility       Exercise Donning/doffing shirt without moving shoulder: Patient able to independently direct caregiver Method for sponge bathing under operated UE: Supervision/safety--pt needs min/mod verbal cues to not move shoulder Donning/doffing sling/immobilizer: Patient able to independently direct caregiver Correct  positioning of sling/immobilizer: Patient able to independently direct caregiver ROM for elbow, wrist and digits of operated UE: Supervision/safety Sling wearing schedule (on at all times/off for ADL's): Patient able to independently direct caregiver Proper positioning of operated UE when showering: Patient able to independently direct caregiver;Supervision/safety Dressing change: Patient able to independently direct caregiver Positioning of UE while sleeping: Patient able to independently direct caregiver   Balance     End of Session OT - End of Session Activity Tolerance: Patient tolerated treatment well Patient left: in bed;with call bell/phone within reach  GO Functional Assessment Tool Used: clinical judgement Functional Limitation: Self care Self Care Current Status (O1308): At least 20 percent but less than 40 percent impaired, limited or restricted Self Care Goal Status (M5784): At least 20 percent but less than 40 percent impaired, limited or restricted Self Care Discharge Status [phone]): At least 20 percent but less than 40 percent impaired, limited or restricted   [name] [phone] 06/27/2012, 10:04 AM

## 2012-06-27 NOTE — Progress Notes (Signed)
Pt to d/c home. No home PT/OT or DME recommended. Pt is wearing sling and understands how to use it.  AVS reviewed and "My Chart" discussed with pt. Pt capable of verbalizing medications and follow-up appointments. Remains hemodynamically stable. No signs and symptoms of distress. Educated pt to return to ER in the case of SOB, dizziness, or chest pain.

## 2012-06-27 NOTE — Op Note (Signed)
NAMEMarland Kitchen  Ann Rodriguez, Ann Rodriguez NO.:  [id number]  MEDICAL RECORD NO.:  [id number]  LOCATION:  1601                         FACILITY:  [facility]  PHYSICIAN:  Georges Lynch. Blayklee Mable, M.D.DATE OF BIRTH:  12/31/1928  DATE OF PROCEDURE:  06/26/2012 DATE OF DISCHARGE:                              OPERATIVE REPORT   SURGEON:  Georges Lynch. Sarita Hakanson, M.D.  ASSISTANT:  Dimitri Ped, PA-C  PREOPERATIVE DIAGNOSES: 1. Severe degenerative arthritis of the acromioclavicular joint of the     left shoulder. 2. Complete complex retracted tear of the rotator cuff, left shoulder. 3. Extensive synovitis, chronic left shoulder.  POSTOPERATIVE DIAGNOSES: 1. Severe degenerative arthritis of the acromioclavicular joint of the     left shoulder. 2. Complete complex retracted tear of the rotator cuff, left shoulder. 3. Extensive synovitis, chronic left shoulder.  OPERATION: 1. Open acromionectomy of the left shoulder. 2. Resection of the distal clavicle, left shoulder. 3. Extensive synovectomy of the left shoulder. 4. Repair of a complex retracted tear of the left shoulder utilizing 2     anchors.  PROCEDURE:  Under general anesthesia, routine orthopedic prep and draping of the left shoulder was carried out with the patient in a beach chair position.  She had 2 g of IV Ancef preop.  At this time, the appropriate time-out was carried out prior to coming into the OR in the holding area.  I marked the appropriate left leg.  Note, everything should be left.  At this time, an incision was made over the anterior aspect of the left shoulder after sterile prep and draping.  Bleeders were identified and cauterized.  Following that, I went down and noted a severe overgrowth of bone secondary to arthritis in the [facility] joint.  I resected the distal clavicle at the oscillating saw approximately 1 cm distal clavicle and I cleaned out the [facility] joint.  At this time, she had severe overgrowth of the acromion.  I  protected the underlying cuff and inserted a Bennett retractor and did a partial acromionectomy with the oscillating saw.  Thoroughly irrigated out the area.  Following that, I noticed she had an extensive synovitis, chronic synovitis literally with heeding into the rotator cuff.  I did an extensive debridement of the synovium.  Following that, I went down and identified the rotator cuff tear which was retracted.  I utilized a burr to burr at the lateral articular surface of the humerus.  I then inserted 2 anchors with 4 sutures each in each anchor and brought those up through the rotator cuff and advanced the cuff distally and laterally.  I thoroughly irrigated out the area.  I made sure we were able to re-establish the subacromial space.  Note, the rotator cuff tendon, really was not in good shape secondary to the severe synovitis.  We thoroughly irrigated out the area and reapproximated the deltoid tendon muscle as well as we could.  She had some depth of muscle involvement as well with the synovium secondary to the arthritis.  We injected about 10 mL of Exparel into the wound site, closed the wound in layers in the usual fashion. Sterile dressing was applied.  ______________________________ Georges Lynch Darrelyn Hillock, M.D.     RAG/MEDQ  D:  06/26/2012  T:  06/27/2012  Job:  [id number]

## 2012-06-27 NOTE — Progress Notes (Signed)
Subjective: 1 Day Post-Op Procedure(s) (LRB): LEFT SHOULDER ROTATOR CUFF REPAIR WITH ANCHORS  (Left) Patient reports pain as 7 on 0-10 scale. Doing well except for expected pain in Left shoulder.  Objective: Vital signs in last 24 hours: Temp:  [97.1 F (36.2 C)-98.7 F (37.1 C)] 97.5 F (36.4 C) (04/18 0709) Pulse Rate:  [58-68] 64 (04/18 0709) Resp:  [12-20] 20 (04/18 0709) BP: (115-147)/(55-72) 122/61 mmHg (04/18 0709) SpO2:  [96 %-100 %] 98 % (04/18 0709)  Intake/Output from previous day: 04/17 0701 - 04/18 0700 In: 2138.3 [P.O.:360; I.V.:1728.3; IV Piggyback:50] Out: 810 [Urine:810] Intake/Output this shift: Total I/O In: 480 [P.O.:480] Out: -   No results found for this basename: HGB,  in the last 72 hours No results found for this basename: WBC, RBC, HCT, PLT,  in the last 72 hours No results found for this basename: NA, K, CL, CO2, BUN, CREATININE, GLUCOSE, CALCIUM,  in the last 72 hours  Recent Labs  06/26/12 1245  INR 0.90    Neurologically intact  Assessment/Plan: 1 Day Post-Op Procedure(s) (LRB): LEFT SHOULDER ROTATOR CUFF REPAIR WITH ANCHORS  (Left) Discharge home with home health  Kalif Kattner A 06/27/2012, 7:44 AM

## 2012-06-29 NOTE — Discharge Summary (Signed)
Physician Discharge Summary   Patient ID: Ann Rodriguez MRN: [id number] DOB/AGE: December 20, 1928 79 y.o.  Admit date: 06/26/2012 Discharge date: 06/27/2012  Primary Diagnosis: Left shoulder rotator cuff tear    Admission Diagnoses:  Past Medical History  Diagnosis Date  . Stress fracture of right foot 12/06/2010    NO PROBLEM NOW  . Anxiety   . GERD (gastroesophageal reflux disease)   . Arthritis   . Hypertension     per Dr Katrinka Blazing OV note,/ ESSENTIAL  . Warfarin anticoagulation   . Pain     LEFT KNEE - SINCE A FALL ABOUT May 15, 2012 -- STATES XRAYS SHOWED LEFT KNEE REPLACEMENT WAS OK - BUT PT STILL HAVING PAIN AND SWELLING  . Pain     LEFT SHOULDER PAIN - SINCE FALL May 15, 2012 - TORE ROTATOR CUFF  . Arrhythmia     ATRIAL FIB- RHYTHM CONTROL WITH FLECAINIDE.  CHRONIC WARFARIN  . A-fib   . Hyperlipidemia   . Depression     PT'S HUSBAND AND SON HAVE DIED W/IN PAST YEAR Aug 16, 2011  . Stroke 1/13    PER CT- age related atrophy with mild small vessel ischemic change- no defecits AND PT STATES SHE WAS NEVER AWARE OF HAVING HAD STROKE   Discharge Diagnoses:   Active Problems:   AC joint arthropathy   Rotator cuff (capsule) sprain   Villonodular synovitis, shoulder region  Estimated body mass index is 23.09 kg/(m^2) as calculated from the following:   Height as of this encounter: 5\' 6"  (1.676 m).   Weight as of this encounter: 64.864 kg (143 lb).  Procedure:  Procedure(s) (LRB): LEFT SHOULDER ROTATOR CUFF REPAIR WITH ANCHORS  (Left)   Consults: None  HPI: The patient is a 79 year old female who presents with shoulder complaints. They are right handed and present today reporting pain at the left shoulder that began one month ago. The patient reports that the shoulder symptoms began following a specific injury. The injury resulted from a fall onto the shoulder. The patient reports symptoms which include shoulder pain and decreased range of motion. The patient reports symptoms that  radiate to the left upper arm. The patient describes these symptoms as moderate in severity. Symptoms are exacerbated by motion at the shoulder. She fell outside [employer] and injured her left shoulder. Following that she was able to drive. Her daughter drove her to [place] to a wedding. She had a second fall there but did not injure her left shoulder. She came in with chief complaint of inability to completely raise her left shoulder from her side. MRI revealed torn left rotator cuff tendon.   Laboratory Data: Admission on 06/26/2012, Discharged on 06/27/2012  Component Date Value Range Status  . Prothrombin Time 06/26/2012 12.1  11.6 - 15.2 seconds Final  . INR 06/26/2012 0.90  0.00 - 1.49 Final  Hospital Outpatient Visit on 06/18/2012  Component Date Value Range Status  . MRSA, PCR 06/18/2012 NEGATIVE  NEGATIVE Final  . Staphylococcus aureus 06/18/2012 NEGATIVE  NEGATIVE Final   Comment:                                 The Xpert SA Assay (FDA                          approved for NASAL specimens  in patients over 33 years of age),                          is one component of                          a comprehensive surveillance                          program.  Test performance has                          been validated by [employer] for patients greater                          than or equal to 52 year old.                          It is not intended                          to diagnose infection nor to                          guide or monitor treatment.  . WBC 06/18/2012 6.9  4.0 - 10.5 K/uL Final  . RBC 06/18/2012 4.50  3.87 - 5.11 MIL/uL Final  . Hemoglobin 06/18/2012 13.8  12.0 - 15.0 g/dL Final  . HCT 40/98/1191 42.5  36.0 - 46.0 % Final  . MCV 06/18/2012 94.4  78.0 - 100.0 fL Final  . MCH 06/18/2012 30.7  26.0 - 34.0 pg Final  . MCHC 06/18/2012 32.5  30.0 - 36.0 g/dL Final  . RDW 47/82/9562 13.4  11.5 - 15.5 % Final  .  Platelets 06/18/2012 308  150 - 400 K/uL Final  . aPTT 06/18/2012 47* 24 - 37 seconds Final   Comment:                                 IF BASELINE aPTT IS ELEVATED,                          SUGGEST PATIENT RISK ASSESSMENT                          BE USED TO DETERMINE APPROPRIATE                          ANTICOAGULANT THERAPY.  . Sodium 06/18/2012 138  135 - 145 mEq/L Final  . Potassium 06/18/2012 4.2  3.5 - 5.1 mEq/L Final  . Chloride 06/18/2012 101  96 - 112 mEq/L Final  . CO2 06/18/2012 30  19 - 32 mEq/L Final  . Glucose, Bld 06/18/2012 60* 70 - 99 mg/dL Final  . BUN 13/10/6576 15  6 - 23 mg/dL Final  . Creatinine, Ser 06/18/2012 0.60  0.50 - 1.10 mg/dL Final  . Calcium 46/96/2952 9.9  8.4 - 10.5 mg/dL Final  . Total  Protein 06/18/2012 7.0  6.0 - 8.3 g/dL Final  . Albumin 30/86/5784 3.8  3.5 - 5.2 g/dL Final  . AST 69/62/9528 22  0 - 37 U/L Final  . ALT 06/18/2012 16  0 - 35 U/L Final  . Alkaline Phosphatase 06/18/2012 108  39 - 117 U/L Final  . Total Bilirubin 06/18/2012 0.8  0.3 - 1.2 mg/dL Final  . GFR calc non Af Amer 06/18/2012 83* >90 mL/min Final  . GFR calc Af Amer 06/18/2012 >90  >90 mL/min Final   Comment:                                 The eGFR has been calculated                          using the CKD EPI equation.                          This calculation has not been                          validated in all clinical                          situations.                          eGFR's persistently                          <90 mL/min signify                          possible Chronic Kidney Disease.  Marland Kitchen Prothrombin Time 06/18/2012 22.7* 11.6 - 15.2 seconds Final  . INR 06/18/2012 2.10* 0.00 - 1.49 Final  . Color, Urine 06/18/2012 YELLOW  YELLOW Final  . APPearance 06/18/2012 CLEAR  CLEAR Final  . Specific Gravity, Urine 06/18/2012 1.022  1.005 - 1.030 Final  . pH 06/18/2012 7.5  5.0 - 8.0 Final  . Glucose, UA 06/18/2012 NEGATIVE  NEGATIVE mg/dL Final  . Hgb urine  dipstick 06/18/2012 NEGATIVE  NEGATIVE Final  . Bilirubin Urine 06/18/2012 NEGATIVE  NEGATIVE Final  . Ketones, ur 06/18/2012 NEGATIVE  NEGATIVE mg/dL Final  . Protein, ur 41/32/4401 NEGATIVE  NEGATIVE mg/dL Final  . Urobilinogen, UA 06/18/2012 0.2  0.0 - 1.0 mg/dL Final  . Nitrite 02/72/5366 NEGATIVE  NEGATIVE Final  . Leukocytes, UA 06/18/2012 MODERATE* NEGATIVE Final  . Specimen Description 06/18/2012 URINE, CLEAN CATCH   Final  . Special Requests 06/18/2012 NONE   Final  . Culture  Setup Time 06/18/2012 06/18/2012 13:37   Final  . Colony Count 06/18/2012 NO GROWTH   Final  . Culture 06/18/2012 NO GROWTH   Final  . Report Status 06/18/2012 06/19/2012 FINAL   Final  . Squamous Epithelial / LPF 06/18/2012 RARE  RARE Final  . WBC, UA 06/18/2012 3-6  <3 WBC/hpf Final  . Bacteria, UA 06/18/2012 FEW* RARE Final     X-Rays:Dg Chest 2 View  06/18/2012  *RADIOLOGY REPORT*  Clinical Data: Preop left shoulder rotator cuff repair  CHEST - 2 VIEW  Comparison: 07/24/2011  Findings: Chronic interstitial markings.  No focal consolidation. No pleural effusion or pneumothorax.  The heart is normal in size.  Degenerative changes of the visualized thoracolumbar spine.  IMPRESSION: No evidence of acute cardiopulmonary disease.   Original Report Authenticated By: Charline Bills, M.D.     EKG: Orders placed during the hospital encounter of 07/24/11  . EKG 12-LEAD  . EKG 12-LEAD  . ED EKG  . ED EKG  . EKG     Hospital Course: Ann Rodriguez is a 79 y.o. who was admitted to [facility]. They were brought to the operating room on 06/26/2012 and underwent Procedure(s): LEFT SHOULDER ROTATOR CUFF REPAIR WITH ANCHORS .  Patient tolerated the procedure well and was later transferred to the recovery room and then to the orthopaedic floor for postoperative care.  They were given PO and IV analgesics for pain control following their surgery.  They were given 24 hours of postoperative antibiotics of    Anti-infectives   Start     Dose/Rate Route Frequency Ordered Stop   06/26/12 2200  ceFAZolin (ANCEF) IVPB 1 g/50 mL premix     1 g 100 mL/hr over 30 Minutes Intravenous Every 6 hours 06/26/12 1841 06/27/12 0914   06/26/12 1630  polymyxin B 500,000 Units, bacitracin 50,000 Units in sodium chloride irrigation 0.9 % 500 mL irrigation  Status:  Discontinued       As needed 06/26/12 1630 06/26/12 1729   06/26/12 0818  ceFAZolin (ANCEF) IVPB 2 g/50 mL premix     2 g 100 mL/hr over 30 Minutes Intravenous On call to O.R. 06/26/12 0818 06/26/12 1553     and started on DVT prophylaxis in the form of Coumadin.  OT was ordered for ADL and sling instruction.  Discharge planning consulted to help with postop disposition and equipment needs.  Patient had a decent night on the evening of surgery.  Patient was seen in rounds and was ready to go home.   Discharge Medications: Prior to Admission medications   Medication Sig Start Date End Date Taking? Authorizing Provider  aspirin 81 MG tablet Take 81 mg by mouth daily.   Yes Historical Provider, MD  atorvastatin (LIPITOR) 40 MG tablet Take 40 mg by mouth at bedtime.   Yes Historical Provider, MD  beta carotene w/minerals (OCUVITE) tablet Take 1 tablet by mouth daily with breakfast.    Yes Historical Provider, MD  calcium carbonate (OS-CAL) 600 MG TABS Take 1,800 mg by mouth daily with breakfast.    Yes Historical Provider, MD  cholecalciferol (VITAMIN D) 1000 UNITS tablet Take 2,000 Units by mouth 2 (two) times daily.    Yes Historical Provider, MD  escitalopram (LEXAPRO) 5 MG tablet Take 5 mg by mouth every morning.   Yes Historical Provider, MD  flecainide (TAMBOCOR) 100 MG tablet Take 100 mg by mouth 2 (two) times daily. 100 mg  AM and nightly,   50mg   midday   Yes Historical Provider, MD  OMEPRAZOLE PO Take by mouth. TAKES ONE EVERY AM - STATES IT IS OVER THE COUNTER   Yes Historical Provider, MD  polyethylene glycol (MIRALAX / GLYCOLAX) packet Take 17  g by mouth daily with breakfast.   Yes Historical Provider, MD  warfarin (COUMADIN) 5 MG tablet Take 2.5-5 mg by mouth daily. Takes 2.5 on Tues, Thurs, Sat & 5 mg on Sun, Mon, Wed, Fri.   Yes Historical Provider, MD  methocarbamol (ROBAXIN) 500 MG tablet Take 1 tablet (500 mg total) by mouth every 6 (six) hours as needed. 06/26/12   Jacki Cones, MD  oxyCODONE-acetaminophen (PERCOCET) 5-325 MG per tablet Take 1-2 tablets by mouth every 4 (four) hours as needed for pain (PT STATES HER RX SAYS EVERY 4 TO 6 HOURS PRN). 06/26/12   Jacki Cones, MD  oxymetazoline (AFRIN) 0.05 % nasal spray Place 1 spray into the nose 2 (two) times daily as needed. ALLERGIES    Historical Provider, MD    Diet: Cardiac diet Activity:Wear sling at all times Follow-up:in 12 days Disposition - Home Discharged Condition: fair   Discharge Orders   Future Orders Complete By Expires     Call MD / Call 911  As directed     Comments:      If you experience chest pain or shortness of breath, CALL 911 and be transported to the hospital emergency room.  If you develope a fever above 101 F, pus (white drainage) or increased drainage or redness at the wound, or calf pain, call your surgeon's office.    Constipation Prevention  As directed     Comments:      Drink plenty of fluids.  Prune juice may be helpful.  You may use a stool softener, such as Colace (over the counter) 100 mg twice a day.  Use MiraLax (over the counter) for constipation as needed.    Diet - low sodium heart healthy  As directed     Discharge instructions  As directed     Comments:      Keep your sling on at all times, including sleeping in your sling. The only time you should remove your sling is to shower only but you need to keep your hand against your chest while you shower. For the first few days, remove your dressing, tape a piece of saran wrap over your incision, take your shower, then remove the saran wrap and put a clean dressing on, then  reapply your sling. After two days you can shower without the saran wrap.  Call Dr. Darrelyn Hillock if any wound complications or temperature of 101 degrees F or over.  Call the office for an appointment to see Dr. Darrelyn Hillock in 12 days: [phone] and ask for Dr. Jeannetta Ellis nurse, Mackey Birchwood.    Lifting restrictions  As directed     Comments:      No lifting        Medication List    TAKE these medications       aspirin 81 MG tablet  Take 81 mg by mouth daily.     atorvastatin 40 MG tablet  Commonly known as:  LIPITOR  Take 40 mg by mouth at bedtime.     beta carotene w/minerals tablet  Take 1 tablet by mouth daily with breakfast.     calcium carbonate 600 MG Tabs  Commonly known as:  OS-CAL  Take 1,800 mg by mouth daily with breakfast.     cholecalciferol 1000 UNITS tablet  Commonly known as:  VITAMIN D  Take 2,000 Units by mouth 2 (two) times daily.     escitalopram 5 MG tablet  Commonly known as:  LEXAPRO  Take 5 mg by mouth every morning.     flecainide 100 MG tablet  Commonly known as:  TAMBOCOR  Take 100 mg by mouth 2 (two) times daily. 100 mg  AM and nightly,   50mg   midday     methocarbamol 500 MG tablet  Commonly known as:  ROBAXIN  Take 1 tablet (500 mg total) by mouth every 6 (six) hours as needed.  OMEPRAZOLE PO  Take by mouth. TAKES ONE EVERY AM - STATES IT IS OVER THE COUNTER     oxyCODONE-acetaminophen 5-325 MG per tablet  Commonly known as:  PERCOCET  Take 1-2 tablets by mouth every 4 (four) hours as needed for pain (PT STATES HER RX SAYS EVERY 4 TO 6 HOURS PRN).     oxymetazoline 0.05 % nasal spray  Commonly known as:  AFRIN  Place 1 spray into the nose 2 (two) times daily as needed. ALLERGIES     polyethylene glycol packet  Commonly known as:  MIRALAX / GLYCOLAX  Take 17 g by mouth daily with breakfast.     warfarin 5 MG tablet  Commonly known as:  COUMADIN  Take 2.5-5 mg by mouth daily. Takes 2.5 on Tues, Thurs, Sat & 5 mg on Sun, Mon, Wed,  Fri.           Follow-up Information   Follow up with GIOFFRE,RONALD A, MD. Schedule an appointment as soon as possible for a visit in 12 days.   Contact information:   [address], [address] [place] [address] [place] [address] [phone]       Signed: Kerby Nora 06/29/2012, 7:48 PM

## 2012-08-28 ENCOUNTER — Encounter (HOSPITAL_COMMUNITY): Payer: Self-pay | Admitting: Emergency Medicine

## 2012-08-28 ENCOUNTER — Emergency Department (HOSPITAL_COMMUNITY)
Admission: EM | Admit: 2012-08-28 | Discharge: 2012-08-28 | Disposition: A | Discharge status: Home / Self Care | Payer: Medicare Other | Attending: Emergency Medicine | Admitting: Emergency Medicine

## 2012-08-28 ENCOUNTER — Emergency Department (HOSPITAL_COMMUNITY): Payer: Medicare Other

## 2012-08-28 DIAGNOSIS — W010XXA Fall on same level from slipping, tripping and stumbling without subsequent striking against object, initial encounter: Secondary | ICD-10-CM | POA: Insufficient documentation

## 2012-08-28 DIAGNOSIS — M7918 Myalgia, other site: Secondary | ICD-10-CM

## 2012-08-28 DIAGNOSIS — S99919A Unspecified injury of unspecified ankle, initial encounter: Secondary | ICD-10-CM | POA: Insufficient documentation

## 2012-08-28 DIAGNOSIS — Z8639 Personal history of other endocrine, nutritional and metabolic disease: Secondary | ICD-10-CM | POA: Insufficient documentation

## 2012-08-28 DIAGNOSIS — Z862 Personal history of diseases of the blood and blood-forming organs and certain disorders involving the immune mechanism: Secondary | ICD-10-CM | POA: Insufficient documentation

## 2012-08-28 DIAGNOSIS — Y9289 Other specified places as the place of occurrence of the external cause: Secondary | ICD-10-CM | POA: Insufficient documentation

## 2012-08-28 DIAGNOSIS — Z7982 Long term (current) use of aspirin: Secondary | ICD-10-CM | POA: Insufficient documentation

## 2012-08-28 DIAGNOSIS — Y9389 Activity, other specified: Secondary | ICD-10-CM | POA: Insufficient documentation

## 2012-08-28 DIAGNOSIS — M129 Arthropathy, unspecified: Secondary | ICD-10-CM | POA: Insufficient documentation

## 2012-08-28 DIAGNOSIS — Z8679 Personal history of other diseases of the circulatory system: Secondary | ICD-10-CM | POA: Insufficient documentation

## 2012-08-28 DIAGNOSIS — F411 Generalized anxiety disorder: Secondary | ICD-10-CM | POA: Insufficient documentation

## 2012-08-28 DIAGNOSIS — Z7901 Long term (current) use of anticoagulants: Secondary | ICD-10-CM | POA: Insufficient documentation

## 2012-08-28 DIAGNOSIS — K219 Gastro-esophageal reflux disease without esophagitis: Secondary | ICD-10-CM | POA: Insufficient documentation

## 2012-08-28 DIAGNOSIS — I10 Essential (primary) hypertension: Secondary | ICD-10-CM | POA: Insufficient documentation

## 2012-08-28 DIAGNOSIS — E785 Hyperlipidemia, unspecified: Secondary | ICD-10-CM | POA: Insufficient documentation

## 2012-08-28 DIAGNOSIS — Z8673 Personal history of transient ischemic attack (TIA), and cerebral infarction without residual deficits: Secondary | ICD-10-CM | POA: Insufficient documentation

## 2012-08-28 DIAGNOSIS — F3289 Other specified depressive episodes: Secondary | ICD-10-CM | POA: Insufficient documentation

## 2012-08-28 DIAGNOSIS — Z8739 Personal history of other diseases of the musculoskeletal system and connective tissue: Secondary | ICD-10-CM | POA: Insufficient documentation

## 2012-08-28 DIAGNOSIS — Z87891 Personal history of nicotine dependence: Secondary | ICD-10-CM | POA: Insufficient documentation

## 2012-08-28 DIAGNOSIS — Z79899 Other long term (current) drug therapy: Secondary | ICD-10-CM | POA: Insufficient documentation

## 2012-08-28 DIAGNOSIS — F329 Major depressive disorder, single episode, unspecified: Secondary | ICD-10-CM | POA: Insufficient documentation

## 2012-08-28 DIAGNOSIS — S8990XA Unspecified injury of unspecified lower leg, initial encounter: Secondary | ICD-10-CM | POA: Insufficient documentation

## 2012-08-28 LAB — CBC WITH DIFFERENTIAL/PLATELET
Basophils Relative: 0 % (ref 0–1)
HCT: 34.2 % — ABNORMAL LOW (ref 36.0–46.0)
Hemoglobin: 11.1 g/dL — ABNORMAL LOW (ref 12.0–15.0)
Lymphs Abs: 1 10*3/uL (ref 0.7–4.0)
MCHC: 32.5 g/dL (ref 30.0–36.0)
Monocytes Absolute: 0.7 10*3/uL (ref 0.1–1.0)
Monocytes Relative: 8 % (ref 3–12)
Neutro Abs: 8 10*3/uL — ABNORMAL HIGH (ref 1.7–7.7)
Neutrophils Relative %: 82 % — ABNORMAL HIGH (ref 43–77)
RBC: 3.65 MIL/uL — ABNORMAL LOW (ref 3.87–5.11)

## 2012-08-28 LAB — POCT I-STAT, CHEM 8
BUN: 21 mg/dL (ref 6–23)
Chloride: 105 mEq/L (ref 96–112)
Creatinine, Ser: 0.8 mg/dL (ref 0.50–1.10)
Potassium: 4.4 mEq/L (ref 3.5–5.1)
Sodium: 139 mEq/L (ref 135–145)
TCO2: 26 mmol/L (ref 0–100)

## 2012-08-28 LAB — PROTIME-INR: INR: 2.98 — ABNORMAL HIGH (ref 0.00–1.49)

## 2012-08-28 NOTE — ED Notes (Signed)
Pt cant remember any recent trauma to buttocks or hip after fall one week ago at [facility] on carpeted floor. Pt states that current pain didn't start until she rolled over in bed this am

## 2012-08-28 NOTE — ED Provider Notes (Signed)
Ann Rodriguez is a 79 y.o. female who reports pain and swelling in the left posterior pelvic area. The stone spontaneously, today. She had a fall one week ago and was well in the interim. Today, she has a "stiff leg and increased pain, with walking. She saw her PCP today, who advised she come here for evaluation  Exam: Alert, elderly, female, who appears vigorous and calm, and comfortable. Pelvis examination stable to bilateral compression. Posteriorly in the SI region, left, there is a diffuse indurated and tender area- about 8 x 10 cm. There is no overlying deformity or discoloration of the skin. With range of motion of the left leg and hip, there is pain posteriorly on the pelvis.  Plan: Image of pelvis, check CBC, and INR  Assessment: Nonspecific pelvic pain, likely contusion related, doubt fracture   Medical screening examination/treatment/procedure(s) were conducted as a shared visit with non-physician practitioner(s) and myself.  I personally evaluated the patient during the encounter  Flint Melter, MD 08/30/12 [phone]

## 2012-08-28 NOTE — ED Notes (Signed)
Pt c/o left hip pain and swollen area to buttocks; pt sent here for further eval for possible hematoma; pt denies injury but sts take coumadin and had checked today and was 3.1

## 2012-08-29 NOTE — ED Provider Notes (Signed)
History     CSN: [id number]  Arrival date & time 08/28/12  1746   First MD Initiated Contact with Patient 08/28/12 2003      Chief Complaint  Patient presents with  . Leg Pain    (Consider location/radiation/quality/duration/timing/severity/associated sxs/prior treatment) HPI Comments: 79 y.o. Female with PMHx of a-fib (anti-coagulated with warfarin) presents today complaining of left gluteal pain s/p mechanical fall over a week ago. Pt states she tripped and landed on her left side, did not feel any pain at the time, didn't think anything of it. Last night, pt states pain in the area of her left gluteal woke her from sleep feeling like "a charlie horse." She stood up to try to work out, but was unable to get comfortable. Pain described as severe, constant, localized, better with lying still, worse with weight bearing. Pt did visit her primary care doctor (Dr. Azucena Cecil) who then sent her to the ED to rule out possible hematoma.   Patient is a 80 y.o. female presenting with leg pain.  Leg Pain Associated symptoms: no fever and no neck pain     Past Medical History  Diagnosis Date  . Stress fracture of right foot 12/06/2010    NO PROBLEM NOW  . Anxiety   . GERD (gastroesophageal reflux disease)   . Arthritis   . Hypertension     per Dr Katrinka Blazing OV note,/ ESSENTIAL  . Warfarin anticoagulation   . Pain     LEFT KNEE - SINCE A FALL ABOUT May 15, 2012 -- STATES XRAYS SHOWED LEFT KNEE REPLACEMENT WAS OK - BUT PT STILL HAVING PAIN AND SWELLING  . Pain     LEFT SHOULDER PAIN - SINCE FALL May 15, 2012 - TORE ROTATOR CUFF  . Arrhythmia     ATRIAL FIB- RHYTHM CONTROL WITH FLECAINIDE.  CHRONIC WARFARIN  . A-fib   . Hyperlipidemia   . Depression     PT'S HUSBAND AND SON HAVE DIED W/IN PAST YEAR 08/06/2011  . Stroke 1/13    PER CT- age related atrophy with mild small vessel ischemic change- no defecits AND PT STATES SHE WAS NEVER AWARE OF HAVING HAD STROKE    Past Surgical History  Procedure  Laterality Date  . Bunionectomy      left and right with hammer toe repair  . Appendectomy    . Oophorectomy    . Rotator cuff repair  1/13,  3/13    x 3  right/  . Foot surgery    . Salpingectomy    . Bladder surgery    . Nasal sinus surgery    . Knee arthroscopy  02/09/2011    Procedure: ARTHROSCOPY KNEE;  Surgeon: Jacki Cones;  Location: [facility];  Service: Orthopedics;  Laterality: Left;  Left Knee Arthroscopy with Menisectomy medial, abrasion chondroplasty medial, and synovectomy, supra patella pouch.  . Back surgery      lumbar  . Eye surgery      bilateral cataract extraction with IOL  . Total knee arthroplasty  09/05/2011    Procedure: TOTAL KNEE ARTHROPLASTY;  Surgeon: Jacki Cones, MD;  Location: [facility];  Service: Orthopedics;  Laterality: Left;  . Shoulder open rotator cuff repair Left 06/26/2012    Procedure: LEFT SHOULDER ROTATOR CUFF REPAIR WITH ANCHORS ;  Surgeon: Jacki Cones, MD;  Location: [facility];  Service: Orthopedics;  Laterality: Left;    Family History  Problem Relation Age of Onset  . Diabetes Mother   . Diabetes  Son     History  Substance Use Topics  . Smoking status: Former Smoker -- 0.50 packs/day for 10 years    Types: Cigarettes    Quit date: 02/04/1989  . Smokeless tobacco: Not on file  . Alcohol Use: No     Comment: socially    OB History   Grav Para Term Preterm Abortions TAB SAB Ect Mult Living                  Review of Systems  Constitutional: Negative for fever and diaphoresis.  HENT: Negative for neck pain and neck stiffness.   Eyes: Negative for visual disturbance.  Respiratory: Negative for apnea, chest tightness and shortness of breath.   Cardiovascular: Negative for chest pain and palpitations.  Gastrointestinal: Negative for nausea, vomiting, diarrhea and constipation.  Genitourinary: Negative for dysuria and pelvic pain.  Musculoskeletal: Positive for myalgias and gait problem.       Pt limping on left side s/p  mechanical fall. Pain to left gluteal area with weight bearing. Pt baseline - ambulates well.   Skin: Negative for rash.  Neurological: Negative for dizziness, weakness, light-headedness, numbness and headaches.    Allergies  Review of patient's allergies indicates no known allergies.  Home Medications   Current Outpatient Rx  Name  Route  Sig  Dispense  Refill  . aspirin 81 MG tablet   Oral   Take 81 mg by mouth daily.         Marland Kitchen atorvastatin (LIPITOR) 40 MG tablet   Oral   Take 40 mg by mouth at bedtime.         . beta carotene w/minerals (OCUVITE) tablet   Oral   Take 1 tablet by mouth daily with breakfast.          . calcium carbonate (OS-CAL) 600 MG TABS   Oral   Take 1,800 mg by mouth daily with breakfast.          . cholecalciferol (VITAMIN D) 1000 UNITS tablet   Oral   Take 2,000 Units by mouth 2 (two) times daily.          Marland Kitchen escitalopram (LEXAPRO) 5 MG tablet   Oral   Take 5 mg by mouth every morning.         . flecainide (TAMBOCOR) 100 MG tablet   Oral   Take 100 mg by mouth 2 (two) times daily.          . methocarbamol (ROBAXIN) 500 MG tablet   Oral   Take 500 mg by mouth 3 (three) times daily as needed (for muscle spasms).         Marland Kitchen omeprazole (PRILOSEC) 20 MG capsule   Oral   Take 20 mg by mouth daily.         Marland Kitchen oxyCODONE-acetaminophen (PERCOCET) 10-325 MG per tablet   Oral   Take 1 tablet by mouth every 4 (four) hours as needed for pain.         . polyethylene glycol (MIRALAX / GLYCOLAX) packet   Oral   Take 17 g by mouth daily with breakfast.         . warfarin (COUMADIN) 5 MG tablet   Oral   Take 2.5-5 mg by mouth daily. Takes 2.5 on Tues, Thurs, Sat & 5 mg on Sun, Mon, Wed, Fri.           BP 124/80  Pulse 68  Temp(Src) 98.4 F (36.9 C) (Oral)  Resp 18  SpO2 96%  Physical Exam  Nursing note and vitals reviewed. Constitutional: She is oriented to person, place, and time. She appears well-developed and  well-nourished. No distress.  Pt is well appearing, robust  HENT:  Head: Normocephalic and atraumatic.  Eyes: Conjunctivae and EOM are normal.  Neck: Normal range of motion. Neck supple.  No meningeal signs  Cardiovascular: Normal rate, regular rhythm and normal heart sounds.  Exam reveals no gallop and no friction rub.   No murmur heard. Pulmonary/Chest: Effort normal and breath sounds normal. No respiratory distress. She has no wheezes. She has no rales. She exhibits no tenderness.  Abdominal: Soft. Bowel sounds are normal. She exhibits no distension. There is no tenderness. There is no rebound and no guarding.  Musculoskeletal: Normal range of motion. She exhibits no edema and no tenderness.  FROM to upper and lower extremities. Some pain to left gluteal area bending left knee and with external rotation of left hip  Neurological: She is alert and oriented to person, place, and time. No cranial nerve deficit.  Speech is clear and goal oriented, follows commands Sensation normal to light touch and two point discrimination Moves extremities without ataxia, coordination intact Normal strength in upper and lower extremities bilaterally including dorsiflexion and plantar flexion, strong and equal grip strength   Skin: Skin is warm and dry. She is not diaphoretic. No erythema.  Slight swelling noted to left gluteal area  Psychiatric: She has a normal mood and affect.    ED Course  Procedures (including critical care time)  Labs Reviewed  CBC WITH DIFFERENTIAL - Abnormal; Notable for the following:    RBC 3.65 (*)    Hemoglobin 11.1 (*)    HCT 34.2 (*)    Neutrophils Relative % 82 (*)    Neutro Abs 8.0 (*)    Lymphocytes Relative 10 (*)    All other components within normal limits  PROTIME-INR - Abnormal; Notable for the following:    Prothrombin Time 29.4 (*)    INR 2.98 (*)    All other components within normal limits  POCT I-STAT, CHEM 8 - Abnormal; Notable for the following:     Glucose, Bld 105 (*)    Hemoglobin 10.9 (*)    HCT 32.0 (*)    All other components within normal limits  URINALYSIS, ROUTINE W REFLEX MICROSCOPIC   Dg Pelvis 1-2 Views  08/28/2012   *RADIOLOGY REPORT*  Clinical Data: History of fall complaining of leg pain.  PELVIS - 1-2 VIEW  Comparison: No priors.  Findings: AP view of the bony pelvis and demonstrates no acute displaced fracture of the bony pelvic ring.  Bilateral proximal femurs as visualized appear intact, and femoral heads project over the acetabula bilaterally.  IMPRESSION: 1.  No acute radiographic abnormality of the bony pelvis.   Original Report Authenticated By: Trudie Reed, M.D.     1. Gluteal pain       MDM  Based on physical exam and history, suspicion for bony injury is low. Discussed case with Dr. Effie Shy who also examined the pt and agrees. Will get plain film to confirm. Re-check INR and get basic labs.   Labs are at pt baseline. INR is therapeutic. Xray of pelvis shows no acute displaced fx. Discussed with pt benefits of getting an MRI tonight vs tomorrow. Pt is hemodynamically stable, in no acute distress, and in no pain at this time. Pt would like to go home and return for outpt MRI. Discussed reasons to seek immediate  care. Patient expresses understanding and agrees with plan.     Glade Nurse, PA-C 08/29/12 0157

## 2012-09-01 ENCOUNTER — Encounter: Payer: Self-pay | Admitting: Vascular Surgery

## 2012-09-02 ENCOUNTER — Ambulatory Visit (INDEPENDENT_AMBULATORY_CARE_PROVIDER_SITE_OTHER): Payer: Medicare Other | Admitting: Vascular Surgery

## 2012-09-02 ENCOUNTER — Encounter: Payer: Self-pay | Admitting: Vascular Surgery

## 2012-09-02 VITALS — BP 137/81 | HR 73 | Ht 66.0 in | Wt 149.5 lb

## 2012-09-02 DIAGNOSIS — M7989 Other specified soft tissue disorders: Secondary | ICD-10-CM

## 2012-09-02 NOTE — Progress Notes (Signed)
Subjective:     Patient ID: Ann Rodriguez, female   DOB: [birth year], 79 y.o.   MRN: [id number]  HPI this 79 year old female developed sudden pain and swelling in her left buttock area 5 days ago when she turned over in bed. She is on Coumadin for atrial fibrillation. Recent INR was 3.1 but according to her medical doctor Dr. Azucena Cecil increased to 5.6. She had an MRI performed at [facility] which revealed a 10 x 7 cm left gluteal hematoma. She was referred for evaluation.  Past Medical History  Diagnosis Date  . Stress fracture of right foot 12/06/2010    NO PROBLEM NOW  . Anxiety   . GERD (gastroesophageal reflux disease)   . Arthritis   . Hypertension     per Dr Katrinka Blazing OV note,/ ESSENTIAL  . Warfarin anticoagulation   . Pain     LEFT KNEE - SINCE A FALL ABOUT May 15, 2012 -- STATES XRAYS SHOWED LEFT KNEE REPLACEMENT WAS OK - BUT PT STILL HAVING PAIN AND SWELLING  . Pain     LEFT SHOULDER PAIN - SINCE FALL May 15, 2012 - TORE ROTATOR CUFF  . Arrhythmia     ATRIAL FIB- RHYTHM CONTROL WITH FLECAINIDE.  CHRONIC WARFARIN  . A-fib   . Hyperlipidemia   . Depression     PT'S HUSBAND AND SON HAVE DIED W/IN PAST YEAR Aug 19, 2011  . Stroke 1/13    PER CT- age related atrophy with mild small vessel ischemic change- no defecits AND PT STATES SHE WAS NEVER AWARE OF HAVING HAD STROKE  . Thyroid disease     History  Substance Use Topics  . Smoking status: Former Smoker -- 0.50 packs/day for 10 years    Types: Cigarettes    Quit date: 02/04/1989  . Smokeless tobacco: Not on file  . Alcohol Use: No     Comment: socially    Family History  Problem Relation Age of Onset  . Diabetes Mother   . Diabetes Son     No Known Allergies  Current outpatient prescriptions:aspirin 81 MG tablet, Take 81 mg by mouth daily., Disp: , Rfl: ;  atorvastatin (LIPITOR) 40 MG tablet, Take 40 mg by mouth at bedtime., Disp: , Rfl: ;  beta carotene w/minerals (OCUVITE) tablet, Take 1 tablet by mouth daily with breakfast. ,  Disp: , Rfl: ;  calcium carbonate (OS-CAL) 600 MG TABS, Take 1,800 mg by mouth daily with breakfast. , Disp: , Rfl:  cholecalciferol (VITAMIN D) 1000 UNITS tablet, Take 2,000 Units by mouth 2 (two) times daily. , Disp: , Rfl: ;  escitalopram (LEXAPRO) 5 MG tablet, Take 5 mg by mouth every morning., Disp: , Rfl: ;  flecainide (TAMBOCOR) 100 MG tablet, Take 100 mg by mouth 2 (two) times daily. , Disp: , Rfl: ;  methocarbamol (ROBAXIN) 500 MG tablet, Take 500 mg by mouth 3 (three) times daily as needed (for muscle spasms)., Disp: , Rfl:  omeprazole (PRILOSEC) 20 MG capsule, Take 20 mg by mouth daily., Disp: , Rfl: ;  oxyCODONE-acetaminophen (PERCOCET) 10-325 MG per tablet, Take 1 tablet by mouth every 4 (four) hours as needed for pain., Disp: , Rfl: ;  polyethylene glycol (MIRALAX / GLYCOLAX) packet, Take 17 g by mouth daily with breakfast., Disp: , Rfl:  warfarin (COUMADIN) 5 MG tablet, Take 2.5-5 mg by mouth daily. Takes 2.5 on Tues, Thurs2024-06-09 & 5 mg on Sun, Mon, Wed, Fri., Disp: , Rfl: ;  clonazePAM (KLONOPIN) 1 MG tablet, Take 1  mg by mouth 2 (two) times daily as needed for anxiety., Disp: , Rfl:   BP 137/81  Pulse 73  Ht 5\' 6"  (1.676 m)  Wt 149 lb 8 oz (67.813 kg)  BMI 24.14 kg/m2  SpO2 100%  Body mass index is 24.14 kg/(m^2).           Review of Systems denies chest pain, dyspnea on exertion, PND, orthopnea, distal edema, claudication.    Objective:   Physical Exam blood pressure 137/81 heart rate 73 respirations 18 Gen.-alert and oriented x3 in no apparent distress HEENT normal for age Lungs no rhonchi or wheezing Cardiovascular regular rhythm no murmurs carotid pulses 3+ palpable no bruits audible Abdomen soft nontender no palpable masses Musculoskeletal free of  major deformities Skin clear -no rashes Neurologic normal Lower extremities 3+ femoral and dorsalis pedis pulses palpable bilaterally with no edema Left buttock is swollen with ecchymosis extending into the lateral  thigh down to almost the knee with swelling in the thigh area. There is no fluctuant area palpable. There were excellent pulses in both lower extra images in both feet are well perfused.  MRI revealed large left gluteal hematoma measuring 10 x 7 cm.       Assessment:     Left gluteal hematoma with dissection into adjacent tissues likely a spontaneous bleed due to chronic anti-coagulation with Coumadin-INR recently 5.6    Plan:     #1 hold Coumadin #2 treat with local heat and analgesics #3 hematoma has dissected into tissues and do not believe that drainage or aspiration would be successful #4 discussed this with Dr. Azucena Cecil who will manage patient's pain

## 2012-09-03 ENCOUNTER — Ambulatory Visit (HOSPITAL_COMMUNITY): Admission: RE | Admit: 2012-09-03 | Payer: Medicare Other | Source: Ambulatory Visit

## 2012-09-04 ENCOUNTER — Encounter: Payer: Self-pay | Admitting: Family Medicine

## 2012-09-15 ENCOUNTER — Encounter: Payer: Medicare Other | Admitting: Vascular Surgery

## 2013-01-26 ENCOUNTER — Encounter: Payer: Self-pay | Admitting: Interventional Cardiology

## 2013-01-26 ENCOUNTER — Ambulatory Visit (INDEPENDENT_AMBULATORY_CARE_PROVIDER_SITE_OTHER): Payer: Medicare Other | Admitting: Interventional Cardiology

## 2013-01-26 VITALS — BP 152/72 | HR 64 | Ht 66.0 in | Wt 149.0 lb

## 2013-01-26 DIAGNOSIS — I480 Paroxysmal atrial fibrillation: Secondary | ICD-10-CM

## 2013-01-26 DIAGNOSIS — I4891 Unspecified atrial fibrillation: Secondary | ICD-10-CM

## 2013-01-26 DIAGNOSIS — R0600 Dyspnea, unspecified: Secondary | ICD-10-CM

## 2013-01-26 DIAGNOSIS — R0609 Other forms of dyspnea: Secondary | ICD-10-CM

## 2013-01-26 DIAGNOSIS — R0989 Other specified symptoms and signs involving the circulatory and respiratory systems: Secondary | ICD-10-CM

## 2013-01-26 NOTE — Progress Notes (Signed)
Patient ID: Ann Rodriguez, female   DOB: [birth year], 79 y.o.   MRN: [id number]    [address] [place], [place]  [address] Phone: [phone] Fax:  [phone]  Date:  01/26/2013   ID:  Ann Rodriguez, DOB [birth year], MRN [id number]  PCP:  Sissy Hoff, MD   ASSESSMENT:  1. Dyspnea 2. Paroxysmal atrial fibrillation 3. History of CVA 4. History of hemorrhoidal bleeding on Coumadin. Coumadin was discontinued approximately one month ago  PLAN:  1. BNP, d-dimer, and CBC 2. Further recommendations will be based upon database   SUBJECTIVE: Ann Rodriguez is a 79 y.o. female who complains of a 4 to six-week history of exertional dyspnea, dyspnea with bending and stooping, and inability to perform at her usual level of efficiency without getting tired. She denies chest pain. These complaints do not seem to be associated with palpitations. She has not noticed edema.   Wt Readings from Last 3 Encounters:  01/26/13 149 lb (67.586 kg)  09/02/12 149 lb 8 oz (67.813 kg)  06/27/12 143 lb (64.864 kg)     Past Medical History  Diagnosis Date  . Stress fracture of right foot 12/06/2010    NO PROBLEM NOW  . Anxiety   . GERD (gastroesophageal reflux disease)   . Arthritis   . Hypertension     per Dr Katrinka Blazing OV note,/ ESSENTIAL  . Warfarin anticoagulation   . Pain     LEFT KNEE - SINCE A FALL ABOUT May 15, 2012 -- STATES XRAYS SHOWED LEFT KNEE REPLACEMENT WAS OK - BUT PT STILL HAVING PAIN AND SWELLING  . Pain     LEFT SHOULDER PAIN - SINCE FALL May 15, 2012 - TORE ROTATOR CUFF  . Arrhythmia     ATRIAL FIB- RHYTHM CONTROL WITH FLECAINIDE.  CHRONIC WARFARIN  . A-fib   . Hyperlipidemia   . Depression     PT'S HUSBAND AND SON HAVE DIED W/IN PAST YEAR 08/07/11  . Stroke 1/13    PER CT- age related atrophy with mild small vessel ischemic change- no defecits AND PT STATES SHE WAS NEVER AWARE OF HAVING HAD STROKE  . Thyroid disease     Current Outpatient Prescriptions    Medication Sig Dispense Refill  . aspirin 81 MG tablet Take 81 mg by mouth daily.      Marland Kitchen atorvastatin (LIPITOR) 40 MG tablet Take 40 mg by mouth at bedtime.      . beta carotene w/minerals (OCUVITE) tablet Take 1 tablet by mouth daily with breakfast.       . calcium carbonate (OS-CAL) 600 MG TABS Take 1,800 mg by mouth daily with breakfast.       . cholecalciferol (VITAMIN D) 1000 UNITS tablet Take 2,000 Units by mouth 2 (two) times daily.       Marland Kitchen escitalopram (LEXAPRO) 5 MG tablet Take 5 mg by mouth every morning.      Marland Kitchen esomeprazole (NEXIUM) 20 MG capsule Take 20 mg by mouth daily at 12 noon.      . flecainide (TAMBOCOR) 100 MG tablet Take 100 mg by mouth 2 (two) times daily.       Marland Kitchen oxyCODONE-acetaminophen (PERCOCET) 10-325 MG per tablet Take 1 tablet by mouth every 4 (four) hours as needed for pain.      . polyethylene glycol (MIRALAX / GLYCOLAX) packet Take 17 g by mouth daily with breakfast.       No current facility-administered medications for this visit.  Allergies:   No Known Allergies  Social History:  The patient  reports that she quit smoking about 23 years ago. Her smoking use included Cigarettes. She has a 5 pack-year smoking history. She does not have any smokeless tobacco history on file. She reports that she does not drink alcohol or use illicit drugs.   ROS:  Please see the history of present illness. All other systems reviewed and negative.   OBJECTIVE: VS:  BP 152/72  Pulse 64  Ht 5\' 6"  (1.676 m)  Wt 149 lb (67.586 kg)  BMI 24.06 kg/m2 Well nourished, well developed, in no acute distress, slender and appears younger than her stated age HEENT: normal Neck: JVD moderately elevated. Carotid bruit faint left  Cardiac:  normal S1, S2; RRR; no murmur Lungs:  clear to auscultation bilaterally, no wheezing, rhonchi or rales Abd: soft, nontender, no hepatomegaly Ext: Edema absent. Pulses 2+ Skin: warm and dry Neuro:  CNs 2-12 intact, no focal abnormalities  noted  EKG:  ECG is normal with the exception of left axis deviation       Signed, Darci Needle III, MD 01/26/2013 3:27 PM

## 2013-01-26 NOTE — Patient Instructions (Signed)
Your physician recommends that you continue on your current medications as directed. Please refer to the Current Medication list given to you today.  Labs today: Bnp, Hemoglobin, D-Dimer  Follow up pending lab results

## 2013-01-30 ENCOUNTER — Telehealth: Payer: Self-pay

## 2013-01-30 DIAGNOSIS — R0989 Other specified symptoms and signs involving the circulatory and respiratory systems: Secondary | ICD-10-CM

## 2013-01-30 DIAGNOSIS — R0609 Other forms of dyspnea: Secondary | ICD-10-CM

## 2013-01-30 NOTE — Telephone Encounter (Signed)
Message copied by Jarvis Newcomer on Fri Jan 30, 2013  4:45 PM ------      Message from: Verdis Prime      Created: Fri Jan 30, 2013 10:13 AM       The patient's d-dimer is elevated. We need to exclude pulmonary emboli with a pulmonary CT angiogram with contrast to rule out clot. Diagnosis code 786.09 ------

## 2013-01-30 NOTE — Telephone Encounter (Signed)
pt given labs results and Dr.Smith instructions.The patient's d-dimer is elevated. We need to exclude pulmonary emboli with a pulmonary CT angiogram with contrast to rule out clot.pt instructed to come in for a bmet on Monday 02/02/13 and a scheduler will call her to scheduler her Ct angio w/contrast. Pt verbalized understanding.

## 2013-01-30 NOTE — Telephone Encounter (Signed)
Message copied by Jarvis Newcomer on Fri Jan 30, 2013  4:39 PM ------      Message from: Verdis Prime      Created: Fri Jan 30, 2013 10:13 AM       The patient's d-dimer is elevated. We need to exclude pulmonary emboli with a pulmonary CT angiogram with contrast to rule out clot. Diagnosis code 786.09 ------

## 2013-02-02 ENCOUNTER — Other Ambulatory Visit (INDEPENDENT_AMBULATORY_CARE_PROVIDER_SITE_OTHER): Payer: Medicare Other

## 2013-02-02 DIAGNOSIS — R0989 Other specified symptoms and signs involving the circulatory and respiratory systems: Secondary | ICD-10-CM

## 2013-02-02 DIAGNOSIS — R0609 Other forms of dyspnea: Secondary | ICD-10-CM

## 2013-02-02 LAB — BASIC METABOLIC PANEL
BUN: 18 mg/dL (ref 6–23)
CO2: 24 mEq/L (ref 19–32)
Calcium: 9 mg/dL (ref 8.4–10.5)
Glucose, Bld: 98 mg/dL (ref 70–99)
Sodium: 138 mEq/L (ref 135–145)

## 2013-02-03 ENCOUNTER — Telehealth: Payer: Self-pay

## 2013-02-03 DIAGNOSIS — Z7901 Long term (current) use of anticoagulants: Secondary | ICD-10-CM | POA: Insufficient documentation

## 2013-02-03 NOTE — Telephone Encounter (Signed)
Message copied by Jarvis Newcomer on Tue Feb 03, 2013  8:49 AM ------      Message from: Verdis Prime      Created: Tue Feb 03, 2013  7:57 AM       Normal ------

## 2013-02-03 NOTE — Telephone Encounter (Signed)
lmom labs ok 

## 2013-02-04 NOTE — Telephone Encounter (Signed)
returned call to [facility] pt had stopped coumadin due to hemmrhoid bleeding. pt was told to resume coumadin by Dr.Robinson. pt needs to hold coumadin for 1 month to have 1-2 hemmrhoid banding procedures and would continue on aspirin for that time.adv  wanda I would forward to Dr.Smiith for advisement and call back with his instructions Burna Mortimer verbalized understanding

## 2013-02-04 NOTE — Telephone Encounter (Signed)
New problem    Pt is having banding for hemorrhoids and need to be off coumadin 1 month need to discuss with nurse. Please advise

## 2013-02-04 NOTE — Telephone Encounter (Signed)
ok 

## 2013-02-09 NOTE — Telephone Encounter (Signed)
returned call to [place] at [facility] office.adv her Dr.Smith sts that it is ok to hold coumadin.Pt has INR followed By pcp Dr.David Swayne.Burna Mortimer verbalized understanding.

## 2013-02-17 ENCOUNTER — Telehealth: Payer: Self-pay

## 2013-02-17 NOTE — Telephone Encounter (Signed)
lmom adv pt Ann Rodriguez (scheduling) will call to schedule cta w/contrast

## 2013-02-18 ENCOUNTER — Ambulatory Visit (INDEPENDENT_AMBULATORY_CARE_PROVIDER_SITE_OTHER)
Admission: RE | Admit: 2013-02-18 | Discharge: 2013-02-18 | Disposition: A | Discharge status: Home / Self Care | Payer: Medicare Other | Source: Ambulatory Visit | Attending: Interventional Cardiology | Admitting: Interventional Cardiology

## 2013-02-18 DIAGNOSIS — R0989 Other specified symptoms and signs involving the circulatory and respiratory systems: Secondary | ICD-10-CM

## 2013-02-18 DIAGNOSIS — R0609 Other forms of dyspnea: Secondary | ICD-10-CM

## 2013-02-18 MED ORDER — IOHEXOL 350 MG/ML SOLN
80.0000 mL | Freq: Once | INTRAVENOUS | Status: AC | PRN
  Administered 2013-02-18: 80 mL via INTRAVENOUS

## 2013-02-23 ENCOUNTER — Telehealth: Payer: Self-pay

## 2013-02-23 NOTE — Telephone Encounter (Signed)
pt aware of test reults.No blood clot along to account for shortness of breath. There is evidence of emphysema. pt will f/u with pcp.pt verbalized understanding.

## 2013-02-24 ENCOUNTER — Telehealth: Payer: Self-pay | Admitting: *Deleted

## 2013-02-24 NOTE — Telephone Encounter (Signed)
Message copied by Carmela Hurt on Tue Feb 24, 2013 12:47 PM ------      Message from: Lou Miner      Created: Tue Feb 24, 2013 11:03 AM      Regarding: RE: warfarin refill       Dr. Azucena Cecil manages her warfarin now.  Refill needs to go to their office.             ----- Message -----         From: Carmela Hurt, RN         Sent: 02/23/2013   4:22 PM           To: Gaspar Skeeters Smart, RPH      Subject: warfarin refill                                          Warfarin 5 mg tablet refill [facility]            Thanks, Kim       ------

## 2013-05-05 ENCOUNTER — Encounter: Payer: Self-pay | Admitting: Interventional Cardiology

## 2013-05-29 ENCOUNTER — Other Ambulatory Visit: Payer: Self-pay | Admitting: *Deleted

## 2013-05-29 MED ORDER — FLECAINIDE ACETATE 100 MG PO TABS: 100.0000 mg | ORAL_TABLET | Freq: Two times a day (BID) | ORAL | Status: DC

## 2013-06-11 ENCOUNTER — Ambulatory Visit: Payer: Medicare Other | Admitting: Interventional Cardiology

## 2013-06-23 ENCOUNTER — Ambulatory Visit: Payer: Medicare Other | Admitting: Interventional Cardiology

## 2013-06-25 ENCOUNTER — Ambulatory Visit
Admission: RE | Admit: 2013-06-25 | Discharge: 2013-06-25 | Disposition: A | Discharge status: Home / Self Care | Payer: Medicare Other | Source: Ambulatory Visit | Attending: Family Medicine | Admitting: Family Medicine

## 2013-06-25 ENCOUNTER — Other Ambulatory Visit: Payer: Self-pay | Admitting: Family Medicine

## 2013-06-25 DIAGNOSIS — J209 Acute bronchitis, unspecified: Secondary | ICD-10-CM

## 2013-06-29 ENCOUNTER — Emergency Department (HOSPITAL_COMMUNITY): Payer: Medicare Other

## 2013-06-29 ENCOUNTER — Emergency Department (HOSPITAL_COMMUNITY)
Admission: EM | Admit: 2013-06-29 | Discharge: 2013-06-29 | Disposition: A | Discharge status: Home / Self Care | Payer: Medicare Other | Attending: Emergency Medicine | Admitting: Emergency Medicine

## 2013-06-29 ENCOUNTER — Encounter (HOSPITAL_COMMUNITY): Payer: Self-pay | Admitting: Emergency Medicine

## 2013-06-29 DIAGNOSIS — I10 Essential (primary) hypertension: Secondary | ICD-10-CM | POA: Insufficient documentation

## 2013-06-29 DIAGNOSIS — Z792 Long term (current) use of antibiotics: Secondary | ICD-10-CM | POA: Insufficient documentation

## 2013-06-29 DIAGNOSIS — Z8659 Personal history of other mental and behavioral disorders: Secondary | ICD-10-CM | POA: Insufficient documentation

## 2013-06-29 DIAGNOSIS — Z87891 Personal history of nicotine dependence: Secondary | ICD-10-CM | POA: Insufficient documentation

## 2013-06-29 DIAGNOSIS — Z7982 Long term (current) use of aspirin: Secondary | ICD-10-CM | POA: Insufficient documentation

## 2013-06-29 DIAGNOSIS — J40 Bronchitis, not specified as acute or chronic: Secondary | ICD-10-CM | POA: Insufficient documentation

## 2013-06-29 DIAGNOSIS — Z8781 Personal history of (healed) traumatic fracture: Secondary | ICD-10-CM | POA: Insufficient documentation

## 2013-06-29 DIAGNOSIS — Z79899 Other long term (current) drug therapy: Secondary | ICD-10-CM | POA: Insufficient documentation

## 2013-06-29 DIAGNOSIS — Z7901 Long term (current) use of anticoagulants: Secondary | ICD-10-CM | POA: Insufficient documentation

## 2013-06-29 DIAGNOSIS — E785 Hyperlipidemia, unspecified: Secondary | ICD-10-CM | POA: Insufficient documentation

## 2013-06-29 DIAGNOSIS — M129 Arthropathy, unspecified: Secondary | ICD-10-CM | POA: Insufficient documentation

## 2013-06-29 DIAGNOSIS — Z8673 Personal history of transient ischemic attack (TIA), and cerebral infarction without residual deficits: Secondary | ICD-10-CM | POA: Insufficient documentation

## 2013-06-29 DIAGNOSIS — I4891 Unspecified atrial fibrillation: Secondary | ICD-10-CM | POA: Insufficient documentation

## 2013-06-29 DIAGNOSIS — K219 Gastro-esophageal reflux disease without esophagitis: Secondary | ICD-10-CM | POA: Insufficient documentation

## 2013-06-29 LAB — CBC
HEMATOCRIT: 40.8 % (ref 36.0–46.0)
Hemoglobin: 13.6 g/dL (ref 12.0–15.0)
MCH: 30.6 pg (ref 26.0–34.0)
MCHC: 33.3 g/dL (ref 30.0–36.0)
MCV: 91.9 fL (ref 78.0–100.0)
PLATELETS: 262 10*3/uL (ref 150–400)
RBC: 4.44 MIL/uL (ref 3.87–5.11)
RDW: 14.1 % (ref 11.5–15.5)
WBC: 7.7 10*3/uL (ref 4.0–10.5)

## 2013-06-29 LAB — PROTIME-INR
INR: 1.51 — AB (ref 0.00–1.49)
PROTHROMBIN TIME: 17.8 s — AB (ref 11.6–15.2)

## 2013-06-29 LAB — BASIC METABOLIC PANEL
BUN: 16 mg/dL (ref 6–23)
CHLORIDE: 106 meq/L (ref 96–112)
CO2: 25 meq/L (ref 19–32)
CREATININE: 0.51 mg/dL (ref 0.50–1.10)
Calcium: 9.3 mg/dL (ref 8.4–10.5)
GFR calc non Af Amer: 87 mL/min — ABNORMAL LOW (ref >90)
Glucose, Bld: 96 mg/dL (ref 70–99)
POTASSIUM: 5 meq/L (ref 3.7–5.3)
Sodium: 144 mEq/L (ref 137–147)

## 2013-06-29 LAB — I-STAT TROPONIN, ED: Troponin i, poc: 0.01 ng/mL (ref 0.00–0.08)

## 2013-06-29 LAB — PRO B NATRIURETIC PEPTIDE: PRO B NATRI PEPTIDE: 315.5 pg/mL (ref 0–450)

## 2013-06-29 MED ORDER — PREDNISONE 20 MG PO TABS
40.0000 mg | ORAL_TABLET | Freq: Once | ORAL | Status: AC
  Administered 2013-06-29: 40 mg via ORAL
  Filled 2013-06-29: qty 2

## 2013-06-29 MED ORDER — GUAIFENESIN 100 MG/5ML PO LIQD: 100.0000 mg | ORAL | Status: DC | PRN

## 2013-06-29 MED ORDER — PREDNISONE 10 MG PO TABS: 40.0000 mg | ORAL_TABLET | Freq: Every day | ORAL | Status: DC

## 2013-06-29 MED ORDER — ALBUTEROL SULFATE HFA 108 (90 BASE) MCG/ACT IN AERS: 1.0000 | INHALATION_SPRAY | Freq: Four times a day (QID) | RESPIRATORY_TRACT | Status: DC | PRN

## 2013-06-29 MED ORDER — GUAIFENESIN ER 600 MG PO TB12
600.0000 mg | ORAL_TABLET | Freq: Once | ORAL | Status: AC
  Administered 2013-06-29: 600 mg via ORAL
  Filled 2013-06-29: qty 1

## 2013-06-29 MED ORDER — ALBUTEROL SULFATE HFA 108 (90 BASE) MCG/ACT IN AERS
2.0000 | INHALATION_SPRAY | Freq: Once | RESPIRATORY_TRACT | Status: AC
  Administered 2013-06-29: 2 via RESPIRATORY_TRACT
  Filled 2013-06-29: qty 6.7

## 2013-06-29 NOTE — ED Notes (Signed)
Pt requesting to speak with MD prior to discharge. Dr. Clearance CootsHarper made aware.

## 2013-06-29 NOTE — ED Provider Notes (Signed)
CSN: [id number]     Arrival date & time 06/29/13  1242 History   First MD Initiated Contact with Patient 06/29/13 1502     Chief Complaint  Patient presents with  . Cough  . Shortness of Breath     (Consider location/radiation/quality/duration/timing/severity/associated sxs/prior Treatment) HPI   This is a 80 y.o. female with PMH of anxiety, GERD, hypertension, A. fib (controlled with flecainide), hyperlipidemia, stroke, presenting with cough. Onset 8 days ago, at home. Persistent, hacking, productive of green sputum. Alleviated, but not resolved with cough suppressant.  Her ribs hurt when she coughs. Negative for fever, dyspnea, abdominal pain, nausea, vomiting, back pain, lightheadedness. Negative for hemoptysis.  Past Medical History  Diagnosis Date  . Stress fracture of right foot 12/06/2010    NO PROBLEM NOW  . Anxiety   . GERD (gastroesophageal reflux disease)   . Arthritis   . Hypertension     per Dr Katrinka Blazing OV note,/ ESSENTIAL  . Warfarin anticoagulation   . Pain     LEFT KNEE - SINCE A FALL ABOUT May 15, 2012 -- STATES XRAYS SHOWED LEFT KNEE REPLACEMENT WAS OK - BUT PT STILL HAVING PAIN AND SWELLING  . Pain     LEFT SHOULDER PAIN - SINCE FALL May 15, 2012 - TORE ROTATOR CUFF  . Arrhythmia     ATRIAL FIB- RHYTHM CONTROL WITH FLECAINIDE.  CHRONIC WARFARIN  . A-fib   . Hyperlipidemia   . Depression     PT'S HUSBAND AND SON HAVE DIED W/IN PAST YEAR 2011-08-09  . Stroke 1/13    PER CT- age related atrophy with mild small vessel ischemic change- no defecits AND PT STATES SHE WAS NEVER AWARE OF HAVING HAD STROKE  . Thyroid disease    Past Surgical History  Procedure Laterality Date  . Bunionectomy      left and right with hammer toe repair  . Appendectomy    . Oophorectomy    . Rotator cuff repair  1/13,  3/13    x 3  right/  . Foot surgery    . Salpingectomy    . Bladder surgery    . Nasal sinus surgery    . Knee arthroscopy  02/09/2011    Procedure: ARTHROSCOPY  KNEE;  Surgeon: Jacki Cones;  Location: [facility];  Service: Orthopedics;  Laterality: Left;  Left Knee Arthroscopy with Menisectomy medial, abrasion chondroplasty medial, and synovectomy, supra patella pouch.  . Back surgery      lumbar  . Eye surgery      bilateral cataract extraction with IOL  . Total knee arthroplasty  09/05/2011    Procedure: TOTAL KNEE ARTHROPLASTY;  Surgeon: Jacki Cones, MD;  Location: [facility];  Service: Orthopedics;  Laterality: Left;  . Shoulder open rotator cuff repair Left 06/26/2012    Procedure: LEFT SHOULDER ROTATOR CUFF REPAIR WITH ANCHORS ;  Surgeon: Jacki Cones, MD;  Location: [facility];  Service: Orthopedics;  Laterality: Left;   Family History  Problem Relation Age of Onset  . Diabetes Mother   . Diabetes Son    History  Substance Use Topics  . Smoking status: Former Smoker -- 0.50 packs/day for 10 years    Types: Cigarettes    Quit date: 02/04/1989  . Smokeless tobacco: Not on file  . Alcohol Use: No     Comment: socially   OB History   Grav Para Term Preterm Abortions TAB SAB Ect Mult Living  Review of Systems  Constitutional: Negative for fever and chills.  HENT: Negative for facial swelling.   Eyes: Negative for photophobia and pain.  Respiratory: Positive for cough. Negative for shortness of breath.   Cardiovascular: Negative for leg swelling.  Gastrointestinal: Negative for nausea, vomiting and abdominal pain.  Genitourinary: Negative for dysuria.  Musculoskeletal: Negative for arthralgias.  Skin: Negative for rash and wound.  Neurological: Negative for seizures.  Hematological: Negative for adenopathy.      Allergies  Review of patient's allergies indicates no known allergies.  Home Medications   Prior to Admission medications   Medication Sig Start Date End Date Taking? Authorizing Provider  aspirin 81 MG chewable tablet Chew 81 mg by mouth daily.   Yes Historical Provider, MD  atorvastatin (LIPITOR)  40 MG tablet Take 40 mg by mouth every evening.    Yes Historical Provider, MD  beta carotene w/minerals (OCUVITE) tablet Take 1 tablet by mouth daily with breakfast.    Yes Historical Provider, MD  calcium carbonate (OS-CAL) 600 MG TABS Take 1,800 mg by mouth daily with breakfast.    Yes Historical Provider, MD  cholecalciferol (VITAMIN D) 1000 UNITS tablet Take 1,000 Units by mouth 2 (two) times daily.    Yes Historical Provider, MD  esomeprazole (NEXIUM) 20 MG capsule Take 20 mg by mouth every morning.    Yes Historical Provider, MD  HYDROcodone-homatropine (HYCODAN) 5-1.5 MG/5ML syrup Take 5 mLs by mouth every 6 (six) hours as needed for cough.   Yes Historical Provider, MD  Polyethyl Glycol-Propyl Glycol (SYSTANE OP) Apply 1 drop to eye daily.   Yes Historical Provider, MD  warfarin (COUMADIN) 2.5 MG tablet Take 2.5-5 mg by mouth daily. Take 2.5 mg by mouth Monday- Saturday.  Take 5 mg by mouth on Sunday.   Yes Historical Provider, MD  levofloxacin (LEVAQUIN) 500 MG tablet Take 500 mg by mouth 2 (two) times daily. 06/25/13   Historical Provider, MD  polyethylene glycol (MIRALAX / GLYCOLAX) packet Take 17 g by mouth daily with breakfast.    Historical Provider, MD   BP 135/69  Pulse 62  Temp(Src) 97.4 F (36.3 C) (Oral)  Resp 12  Wt 145 lb (65.772 kg)  SpO2 99% Physical Exam  Constitutional: She is oriented to person, place, and time. She appears well-developed and well-nourished. No distress.  HENT:  Head: Normocephalic and atraumatic.  Mouth/Throat: No oropharyngeal exudate.  Eyes: Conjunctivae are normal. Pupils are equal, round, and reactive to light. No scleral icterus.  Neck: Normal range of motion. No tracheal deviation present. No thyromegaly present.  Cardiovascular: Normal rate, regular rhythm and normal heart sounds.  Exam reveals no gallop and no friction rub.   No murmur heard. Pulmonary/Chest: Effort normal and breath sounds normal. No stridor. No respiratory distress. She  has no wheezes. She has no rales. She exhibits no tenderness.  Abdominal: Soft. She exhibits no distension and no mass. There is no tenderness. There is no rebound and no guarding.  Musculoskeletal: Normal range of motion. She exhibits no edema.  Neurological: She is alert and oriented to person, place, and time.  Skin: Skin is warm and dry. She is not diaphoretic.    ED Course  Procedures (including critical care time) Labs Review Labs Reviewed  BASIC METABOLIC PANEL - Abnormal; Notable for the following:    GFR calc non Af Amer 87 (*)    All other components within normal limits  PROTIME-INR - Abnormal; Notable for the following:    Prothrombin  Time 17.8 (*)    INR 1.51 (*)    All other components within normal limits  CBC  PRO B NATRIURETIC PEPTIDE  I-STAT TROPOININ, ED    Imaging Review Dg Chest 2 View  06/29/2013   CLINICAL DATA:  Cough, congestion, chest pain, history hypertension, former smoker  EXAM: CHEST  2 VIEW  COMPARISON:  06/25/2013  FINDINGS: Upper normal heart size.  Tortuous thoracic aorta.  Pulmonary vascularity normal.  Slight rotation of the shoulders to the right.  Emphysematous changes without pulmonary infiltrate, pleural effusion or pneumothorax.  Osseous demineralization with old fractures of the posterior left fifth sixth and seventh ribs.  IMPRESSION: COPD changes.  No acute abnormalities.   Electronically Signed   By: Ulyses SouthwardMark  Boles M.D.   On: 06/29/2013 15:59     MDM   Final diagnoses:  None    This is a 80 y.o. female with PMH of anxiety, GERD, hypertension, A. fib (controlled with flecainide), hyperlipidemia, stroke, presenting with cough. Onset 8 days ago, at home. Persistent, hacking, productive of green sputum. Alleviated, but not resolved with cough suppressant.  Her ribs hurt when she coughs. Negative for fever, dyspnea, abdominal pain, nausea, vomiting, back pain, lightheadedness. Negative for hemoptysis.  Patient was diagnosed with bronchitis  last week, is taking Levaquin, Augmentin. Chest x-ray 4 days ago was without acute process.  Examination as above. Vital signs are within normal limits. Cardiovascular examination is within normal limits. Patient has rhonchi on the left side which are easily cleared with cough. Otherwise, remainder of examination is within normal limits.   Date: 06/29/2013  Rate: 68  Rhythm: normal sinus rhythm  QRS Axis: normal  Intervals: normal  ST/T Wave abnormalities: nonspecific ST changes  Conduction Disutrbances:none  Narrative Interpretation:  Nonspecific ST changes, no change from EKG on 01/26/13  Old EKG Reviewed: unchanged  I doubt the patient's presentation represents ACS, PE, PTX, pericarditis, tamponade, dissection, esophageal pathology.  The patient is afebrile and does not have pulmonary findings assistant with pneumonia; however, considering age, productive cough, amount of time patient has been suffering from these symptoms, will repeat chest x-ray to ensure there is no infiltrate is formed. In the meantime will provide bronchodilator, Mucinex in the department. We'll encourage by mouth hydration. Anticipate discharge.  The patient's chest x-ray is within normal limits, besides emphysematous changes. I believe this presentation is due to viral bronchitis versus exacerbation of undiagnosed COPD. I will administer steroids today and discharged with burst of steroids.  We'll also discharge with albuterol inhaler and Mucinex with instructions to followup with PCP.  Pt stable for discharge, FU.  All questions answered.  Return precautions given.  I have discussed case and care has been guided by my attending physician, Dr. Patria Maneampos.  Loma BostonStirling Nataley Bahri, MD 06/30/13 [phone]

## 2013-06-29 NOTE — Discharge Instructions (Signed)

## 2013-06-29 NOTE — ED Notes (Signed)
Pt comfortable with discharge and follow up instructions. Prescriptions x3. 

## 2013-06-29 NOTE — ED Notes (Signed)
Pt arrives reporting SOB and cough for the last 1.5 weeks. Reports saw PMD last week diagnosed with bronchitis.  Pt has been taking levaquin and augmentin with little relief in symptoms. Denies recent fever. Pt with labored resp with exertion, coarse breath sounds, awake alert, VSS.

## 2013-06-29 NOTE — ED Notes (Signed)
Pt states does not want chest x-ray today. States wants to speak with EDP prior to receiving CXR.

## 2013-06-29 NOTE — ED Notes (Signed)
Dr. Clearance CootsHarper at bedside. Pt with no further questions and comfortable with discharge.

## 2013-06-30 NOTE — ED Provider Notes (Signed)
I saw and evaluated the patient, reviewed the resident's note and I agree with the findings and plan.   EKG Interpretation None      Suspect viral bronchitis versus COPD.  Improvement with bronchodilator steroids.  Outpatient followup  Lyanne CoKevin M Bani Gianfrancesco, MD 06/30/13 2143

## 2013-07-01 ENCOUNTER — Encounter (HOSPITAL_COMMUNITY): Payer: Self-pay | Admitting: Emergency Medicine

## 2013-07-01 ENCOUNTER — Emergency Department (HOSPITAL_COMMUNITY)
Admission: EM | Admit: 2013-07-01 | Discharge: 2013-07-01 | Disposition: A | Discharge status: Home / Self Care | Payer: Medicare Other | Attending: Emergency Medicine | Admitting: Emergency Medicine

## 2013-07-01 DIAGNOSIS — R059 Cough, unspecified: Secondary | ICD-10-CM | POA: Insufficient documentation

## 2013-07-01 DIAGNOSIS — Z87891 Personal history of nicotine dependence: Secondary | ICD-10-CM | POA: Insufficient documentation

## 2013-07-01 DIAGNOSIS — M129 Arthropathy, unspecified: Secondary | ICD-10-CM | POA: Insufficient documentation

## 2013-07-01 DIAGNOSIS — I471 Supraventricular tachycardia, unspecified: Secondary | ICD-10-CM | POA: Insufficient documentation

## 2013-07-01 DIAGNOSIS — Z792 Long term (current) use of antibiotics: Secondary | ICD-10-CM | POA: Insufficient documentation

## 2013-07-01 DIAGNOSIS — F411 Generalized anxiety disorder: Secondary | ICD-10-CM | POA: Insufficient documentation

## 2013-07-01 DIAGNOSIS — F3289 Other specified depressive episodes: Secondary | ICD-10-CM | POA: Insufficient documentation

## 2013-07-01 DIAGNOSIS — I499 Cardiac arrhythmia, unspecified: Secondary | ICD-10-CM | POA: Insufficient documentation

## 2013-07-01 DIAGNOSIS — E079 Disorder of thyroid, unspecified: Secondary | ICD-10-CM | POA: Insufficient documentation

## 2013-07-01 DIAGNOSIS — R42 Dizziness and giddiness: Secondary | ICD-10-CM | POA: Insufficient documentation

## 2013-07-01 DIAGNOSIS — R52 Pain, unspecified: Secondary | ICD-10-CM | POA: Insufficient documentation

## 2013-07-01 DIAGNOSIS — K219 Gastro-esophageal reflux disease without esophagitis: Secondary | ICD-10-CM | POA: Insufficient documentation

## 2013-07-01 DIAGNOSIS — Z7901 Long term (current) use of anticoagulants: Secondary | ICD-10-CM | POA: Insufficient documentation

## 2013-07-01 DIAGNOSIS — I10 Essential (primary) hypertension: Secondary | ICD-10-CM | POA: Insufficient documentation

## 2013-07-01 DIAGNOSIS — R002 Palpitations: Secondary | ICD-10-CM | POA: Insufficient documentation

## 2013-07-01 DIAGNOSIS — R05 Cough: Secondary | ICD-10-CM | POA: Insufficient documentation

## 2013-07-01 DIAGNOSIS — F329 Major depressive disorder, single episode, unspecified: Secondary | ICD-10-CM | POA: Insufficient documentation

## 2013-07-01 DIAGNOSIS — E785 Hyperlipidemia, unspecified: Secondary | ICD-10-CM | POA: Insufficient documentation

## 2013-07-01 DIAGNOSIS — Z8673 Personal history of transient ischemic attack (TIA), and cerebral infarction without residual deficits: Secondary | ICD-10-CM | POA: Insufficient documentation

## 2013-07-01 DIAGNOSIS — Z79899 Other long term (current) drug therapy: Secondary | ICD-10-CM | POA: Insufficient documentation

## 2013-07-01 MED ORDER — FLECAINIDE ACETATE 100 MG PO TABS
100.0000 mg | ORAL_TABLET | Freq: Once | ORAL | Status: AC
  Administered 2013-07-01: 100 mg via ORAL
  Filled 2013-07-01: qty 1

## 2013-07-01 MED ORDER — ADENOSINE 6 MG/2ML IV SOLN: 6.0000 mg | Freq: Once | INTRAVENOUS | Status: DC

## 2013-07-01 MED ORDER — DILTIAZEM LOAD VIA INFUSION
10.0000 mg | Freq: Once | INTRAVENOUS | Status: AC
  Administered 2013-07-01: 10 mg via INTRAVENOUS
  Filled 2013-07-01: qty 10

## 2013-07-01 MED ORDER — DILTIAZEM HCL 100 MG IV SOLR
5.0000 mg/h | INTRAVENOUS | Status: DC
  Administered 2013-07-01: 5 mg/h via INTRAVENOUS

## 2013-07-01 NOTE — ED Notes (Signed)
Pt's rhythm spontaneously converted. HR currently 80, remains irregular. Dr. Clearance CootsHarper Notified.

## 2013-07-01 NOTE — Discharge Instructions (Signed)
RESTART FLECAINIDE   Cardiac Arrhythmia Your heart is a muscle that works to pump blood through your body by regular contractions. The beating of your heart is controlled by a system of special pacemaker cells. These cells control the electrical activity of the heart. When the system controlling this regular beating is disturbed, a heart rhythm abnormality (arrhythmia) results. WHEN YOUR HEART SKIPS A BEAT One of the most common and least serious heart arrhythmias is called an ectopic or premature atrial heartbeat (PAC). This may be noticed as a small change in your regular pulse. A PAC originates from the top part (atrium) of the heart. Within the right atrium, the SA node is the area that normally controls the regularity of the heart. PACs occur in heart tissue outside of the SA node region. You may feel this as a skipped beat or heart flutter, especially if several occur in succession or occur frequently.  Another arrhythmia is ventricular premature complex (VCP or PVC). These extra beats start out in the bottom, more muscular chambers of the heart. In most cases a PVC is harmless. If there are underlying causes that are making the heart irritable such as an overactive thyroid or a prior heart attack PVCs may be of more concern. In a few cases, medications to control the heart rhythm may be prescribed. Things to try at home:  Cut down or avoid alcohol, tobacco and caffeine.  Get enough sleep.  Reduce stress.  Exercise more. WHEN THE HEART BEATS TOO FAST Atrial tachycardia is a fast heart rate, which starts out in the atrium. It may last from minutes to much longer. Your heart may beat 140 to 240 times per minute instead of the normal 60 to 100.  Symptoms include a worried feeling (anxiety) and a sense that your heart is beating fast and hard.  You may be able to stop the fast rate by holding your breath or bearing down as if you were going to have a bowel movement.  This type of fast rate  is usually not dangerous. Atrial fibrillation and atrial flutter are other fast rhythms that start in the atria. Both conditions keep the atria from filling with enough blood so the heart does not work well.  Symptoms include feeling light-headed or faint.  These fast rates may be the result of heart damage or disease. Too much thyroid hormone may play a role.  There may be no clear cause or it may be from heart disease or damage.  Medication or a special electrical treatment (cardioversion) may be needed to get the heart beating normally. Ventricular tachycardia is a fast heart rate that starts in the lower muscular chambers (ventricles) This is a serious disorder that requires treatment as soon as possible. You need someone else to get and use a small defibrillator.  Symptoms include collapse, chest pain, or being short of breath.  Treatment may include medication, procedures to improve blood flow to the heart, or an implantable cardiac defibrillator (ICD). DIAGNOSIS   A cardiogram (EKG or ECG) will be done to see the arrhythmia, as well as lab tests to check the underlying cause.  If the extra beats or fast rate come and go, you may wear a Holter monitor that records your heart rate for a longer period of time. SEEK MEDICAL CARE IF:  You have irregular or fast heartbeats (palpitations).  You experience skipped beats.  You develop lightheadedness.  You have chest discomfort.  You have shortness of breath.  You have  more frequent episodes, if you are already being treated. SEEK IMMEDIATE MEDICAL CARE IF:   You have severe chest pain, especially if the pain is crushing or pressure-like and spreads to the arms, back, neck, or jaw, or if you have sweating, feeling sick to your stomach (nausea), or shortness of breath. THIS IS AN EMERGENCY. Do not wait to see if the pain will go away. Get medical help at once. Call 911 or 0 (operator). DO NOT drive yourself to the hospital.  You  feel dizzy or faint.  You have episodes of previously documented atrial tachycardia that do not resolve with the techniques your caregiver has taught you.  Irregular or rapid heartbeats begin to occur more often than in the past, especially if they are associated with more pronounced symptoms or of longer duration. Document Released: 02/26/2005 Document Revised: 05/21/2011 Document Reviewed: 10/15/2007 [facility] Patient Information 2014 [place], [place].

## 2013-07-01 NOTE — ED Notes (Signed)
Pt reports she is being tx for antibiotics with levoquin so PCP took her off her Flecainide. Reports last night started to go into a-fib. Pt hr 175 in triage. Denies cp. Reports lightheadedness, sob and fatigue. Pt speaking in full sentences skin warm and dry.

## 2013-07-01 NOTE — ED Provider Notes (Signed)
CSN: [id number]     Arrival date & time 07/01/13  08/12/2001 History   First MD Initiated Contact with Patient 07/01/13 2034-08-13     Chief Complaint  Patient presents with  . Atrial Fibrillation     (Consider location/radiation/quality/duration/timing/severity/associated sxs/prior Treatment) HPI  This is a 80 y.o. female with PMH of GERD, hypertension, atrial fibrillation, hyperlipidemia, stroke, presenting with palpitations, tachycardia. Patient has had an episode of palpitations yesterday as well as 2 days ago. Today have lasted an estimated 10-30 minutes each, resolving spontaneously. Onset of today's episode was earlier this afternoon, at home.  It has been persistent, lasting for hours. She describes "fluttering" of her heart, lightheadedness. Patient has not taken her flecainide for the last 6 days due to concern for interactions with Levaquin. The patient denies chest pain, shortness breath, change in vision, dizziness, focal weakness, numbness, or tingling. She denies dysuria.  She states that her cough has improved with steroids and Mucinex, good oral hydration.  Past Medical History  Diagnosis Date  . Stress fracture of right foot 12/06/2010    NO PROBLEM NOW  . Anxiety   . GERD (gastroesophageal reflux disease)   . Arthritis   . Hypertension     per Dr Katrinka Blazing OV note,/ ESSENTIAL  . Warfarin anticoagulation   . Pain     LEFT KNEE - SINCE A FALL ABOUT May 15, 2012 -- STATES XRAYS SHOWED LEFT KNEE REPLACEMENT WAS OK - BUT PT STILL HAVING PAIN AND SWELLING  . Pain     LEFT SHOULDER PAIN - SINCE FALL May 15, 2012 - TORE ROTATOR CUFF  . Arrhythmia     ATRIAL FIB- RHYTHM CONTROL WITH FLECAINIDE.  CHRONIC WARFARIN  . A-fib   . Hyperlipidemia   . Depression     PT'S HUSBAND AND SON HAVE DIED W/IN PAST YEAR 2011-08-13  . Stroke 1/13    PER CT- age related atrophy with mild small vessel ischemic change- no defecits AND PT STATES SHE WAS NEVER AWARE OF HAVING HAD STROKE  . Thyroid disease     Past Surgical History  Procedure Laterality Date  . Bunionectomy      left and right with hammer toe repair  . Appendectomy    . Oophorectomy    . Rotator cuff repair  1/13,  3/13    x 3  right/  . Foot surgery    . Salpingectomy    . Bladder surgery    . Nasal sinus surgery    . Knee arthroscopy  02/09/2011    Procedure: ARTHROSCOPY KNEE;  Surgeon: Jacki Cones;  Location: [facility];  Service: Orthopedics;  Laterality: Left;  Left Knee Arthroscopy with Menisectomy medial, abrasion chondroplasty medial, and synovectomy, supra patella pouch.  . Back surgery      lumbar  . Eye surgery      bilateral cataract extraction with IOL  . Total knee arthroplasty  09/05/2011    Procedure: TOTAL KNEE ARTHROPLASTY;  Surgeon: Jacki Cones, MD;  Location: [facility];  Service: Orthopedics;  Laterality: Left;  . Shoulder open rotator cuff repair Left 06/26/2012    Procedure: LEFT SHOULDER ROTATOR CUFF REPAIR WITH ANCHORS ;  Surgeon: Jacki Cones, MD;  Location: [facility];  Service: Orthopedics;  Laterality: Left;   Family History  Problem Relation Age of Onset  . Diabetes Mother   . Diabetes Son    History  Substance Use Topics  . Smoking status: Former Smoker -- 0.50 packs/day for 10 years  Types: Cigarettes    Quit date: 02/04/1989  . Smokeless tobacco: Not on file  . Alcohol Use: No     Comment: socially   OB History   Grav Para Term Preterm Abortions TAB SAB Ect Mult Living                 Review of Systems  Constitutional: Negative for fever and chills.  HENT: Negative for facial swelling.   Eyes: Negative for photophobia and pain.  Respiratory: Positive for cough. Negative for shortness of breath.   Cardiovascular: Positive for palpitations. Negative for chest pain and leg swelling.  Gastrointestinal: Negative for nausea, vomiting and abdominal pain.  Genitourinary: Negative for dysuria.  Musculoskeletal: Negative for arthralgias.  Skin: Negative for rash and wound.   Neurological: Positive for light-headedness. Negative for seizures.  Hematological: Negative for adenopathy.      Allergies  Review of patient's allergies indicates no known allergies.  Home Medications   Prior to Admission medications   Medication Sig Start Date End Date Taking? Authorizing Provider  albuterol (PROVENTIL HFA;VENTOLIN HFA) 108 (90 BASE) MCG/ACT inhaler Inhale 1-2 puffs into the lungs every 6 (six) hours as needed for wheezing or shortness of breath. 06/29/13  Yes Loma BostonStirling Antavius Sperbeck, MD  aspirin 81 MG chewable tablet Chew 81 mg by mouth daily.   Yes Historical Provider, MD  atorvastatin (LIPITOR) 40 MG tablet Take 40 mg by mouth every evening.    Yes Historical Provider, MD  beta carotene w/minerals (OCUVITE) tablet Take 1 tablet by mouth daily with breakfast.    Yes Historical Provider, MD  calcium carbonate (OS-CAL) 600 MG TABS Take 1,800 mg by mouth daily with breakfast.    Yes Historical Provider, MD  cholecalciferol (VITAMIN D) 1000 UNITS tablet Take 1,000 Units by mouth 2 (two) times daily.    Yes Historical Provider, MD  esomeprazole (NEXIUM) 20 MG capsule Take 20 mg by mouth every morning.    Yes Historical Provider, MD  HYDROcodone-homatropine (HYCODAN) 5-1.5 MG/5ML syrup Take 5 mLs by mouth every 6 (six) hours as needed for cough.   Yes Historical Provider, MD  levofloxacin (LEVAQUIN) 500 MG tablet Take 500 mg by mouth daily.  06/25/13  Yes Historical Provider, MD  Polyethyl Glycol-Propyl Glycol (SYSTANE OP) Apply 1 drop to eye daily as needed (for dry eyes).    Yes Historical Provider, MD  polyethylene glycol (MIRALAX / GLYCOLAX) packet Take 17 g by mouth See admin instructions. Take four days a week on Monday, Tuesday, Thursday, and Friday   Yes Historical Provider, MD  predniSONE (DELTASONE) 10 MG tablet Take 4 tablets (40 mg total) by mouth daily. 06/30/13  Yes Loma BostonStirling Lewanna Petrak, MD  warfarin (COUMADIN) 2.5 MG tablet Take 2.5-5 mg by mouth daily at 6 PM. Take 2.5 mg  by mouth Monday- Saturday.  Take 5 mg by mouth on Sunday.   Yes Historical Provider, MD  flecainide (TAMBOCOR) 100 MG tablet Take 100 mg by mouth 2 (two) times daily.    Historical Provider, MD   BP 133/90  Pulse 64  Temp(Src) 98 F (36.7 C) (Oral)  Resp 14  Ht 5\' 6"  (1.676 m)  Wt 141 lb (63.957 kg)  BMI 22.77 kg/m2  SpO2 100% Physical Exam  Constitutional: She is oriented to person, place, and time. She appears well-developed and well-nourished. No distress.  HENT:  Head: Normocephalic and atraumatic.  Mouth/Throat: No oropharyngeal exudate.  Eyes: Conjunctivae are normal. Pupils are equal, round, and reactive to light. No scleral icterus.  Neck: Normal range of motion. No tracheal deviation present. No thyromegaly present.  Cardiovascular: Normal heart sounds.  Exam reveals no gallop and no friction rub.   No murmur heard. Tachycardic  Pulmonary/Chest: Effort normal and breath sounds normal. No stridor. No respiratory distress. She has no wheezes. She has no rales. She exhibits no tenderness.  Abdominal: Soft. She exhibits no distension and no mass. There is no tenderness. There is no rebound and no guarding.  Musculoskeletal: Normal range of motion. She exhibits no edema.  Neurological: She is alert and oriented to person, place, and time.  Skin: Skin is warm and dry. She is not diaphoretic.    ED Course  Procedures (including critical care time)    MDM   Final diagnoses:  Paroxysmal atrial tachycardia    This is a 80 y.o. female with PMH of GERD, hypertension, atrial fibrillation, hyperlipidemia, stroke, presenting with palpitations, tachycardia. Patient has had an episode of palpitations yesterday as well as 2 days ago. Today have lasted an estimated 10-30 minutes each, resolving spontaneously. Onset of today's episode was earlier this afternoon, at home.  It has been persistent, lasting for hours. She describes "fluttering" of her heart, lightheadedness. Patient has not  taken her flecainide for the last 6 days due to concern for interactions with Levaquin. The patient denies chest pain, shortness breath, change in vision, dizziness, focal weakness, numbness, or tingling. She denies dysuria.  She states that her cough has improved with steroids and Mucinex, good oral hydration.  Exam as above, patient afebrile, tachycardic to 175 beats per minute, but breathing at 18 times a minute. Sinus tachycardia, heart and lungs are within normal limits. Patient has no focal neuro deficits, although she feels as though I were to walk her, she would pass out. She has good pulses in all extremities. EKG is as follows:   Date: 07/01/2013 20:07  Rate: 174  Rhythm: supraventricular tachycardia (SVT)  QRS Axis: normal  Intervals: normal  ST/T Wave abnormalities: nonspecific ST changes  Conduction Disutrbances:nonspecific intraventricular conduction delay  Narrative Interpretation: SVT with IV block  Old EKG Reviewed: changes noted  10 mg diltiazem IV bolus has been given, and diltiazem drip is been started at a rate of 5.  As patient has been having episodes of paroxysmal atrial tachycardia since discontinuing her flecainide, I believe this is the likely etiology of her current episode. She is been converted in the past with adenosine. Valsalva maneuvers unsuccessful. We'll plan to administer adenosine, we'll continue to monitor the patient closely.  While preparing to administer adenosine, patient has converted spontaneously. At this time, considering likely viral bronchitis versus undiagnosed COPD, I do not believe Levaquin as indicated at this time. Will have patient discontinue her Levaquin, will administer today's flecainide.  Diltiazem has been discontinued.   Date: 07/01/2013  Rate: 80  Rhythm: normal sinus rhythm  QRS Axis: normal  Intervals: normal  ST/T Wave abnormalities: nonspecific ST changes  Conduction Disutrbances:none  Narrative Interpretation: Sinus rhythm  with PACs, nonspecific ST changes  Old EKG Reviewed: changes noted  We'll continue to monitor for an additional hour closely.  I've ambulated the patient's without complications. No neuro deficits appreciated. Patient has absolutely no complaints at this time.  Reevaluation reveals a happy asymptomatic patient. Exam remains within normal limits. Pt stable for discharge, FU.  All questions answered.  Return precautions given.  I have discussed case and care has been guided by my attending physician, Dr. Patria Maneampos.  Loma BostonStirling Haunani Dickard, MD 07/02/13 [phone]

## 2013-07-02 ENCOUNTER — Emergency Department (HOSPITAL_COMMUNITY)
Admission: EM | Admit: 2013-07-02 | Discharge: 2013-07-02 | Disposition: A | Discharge status: Home / Self Care | Payer: Medicare Other | Attending: Emergency Medicine | Admitting: Emergency Medicine

## 2013-07-02 ENCOUNTER — Emergency Department (HOSPITAL_COMMUNITY): Payer: Medicare Other

## 2013-07-02 ENCOUNTER — Encounter (HOSPITAL_COMMUNITY): Payer: Self-pay | Admitting: Emergency Medicine

## 2013-07-02 DIAGNOSIS — Z7901 Long term (current) use of anticoagulants: Secondary | ICD-10-CM | POA: Insufficient documentation

## 2013-07-02 DIAGNOSIS — M129 Arthropathy, unspecified: Secondary | ICD-10-CM | POA: Insufficient documentation

## 2013-07-02 DIAGNOSIS — K219 Gastro-esophageal reflux disease without esophagitis: Secondary | ICD-10-CM | POA: Insufficient documentation

## 2013-07-02 DIAGNOSIS — Z79899 Other long term (current) drug therapy: Secondary | ICD-10-CM | POA: Insufficient documentation

## 2013-07-02 DIAGNOSIS — Z87891 Personal history of nicotine dependence: Secondary | ICD-10-CM | POA: Insufficient documentation

## 2013-07-02 DIAGNOSIS — Z8673 Personal history of transient ischemic attack (TIA), and cerebral infarction without residual deficits: Secondary | ICD-10-CM | POA: Insufficient documentation

## 2013-07-02 DIAGNOSIS — H5500 Unspecified nystagmus: Secondary | ICD-10-CM | POA: Insufficient documentation

## 2013-07-02 DIAGNOSIS — I10 Essential (primary) hypertension: Secondary | ICD-10-CM | POA: Insufficient documentation

## 2013-07-02 DIAGNOSIS — Z8659 Personal history of other mental and behavioral disorders: Secondary | ICD-10-CM | POA: Insufficient documentation

## 2013-07-02 DIAGNOSIS — Z8781 Personal history of (healed) traumatic fracture: Secondary | ICD-10-CM | POA: Insufficient documentation

## 2013-07-02 DIAGNOSIS — E785 Hyperlipidemia, unspecified: Secondary | ICD-10-CM | POA: Insufficient documentation

## 2013-07-02 DIAGNOSIS — Z7982 Long term (current) use of aspirin: Secondary | ICD-10-CM | POA: Insufficient documentation

## 2013-07-02 DIAGNOSIS — IMO0002 Reserved for concepts with insufficient information to code with codable children: Secondary | ICD-10-CM | POA: Insufficient documentation

## 2013-07-02 DIAGNOSIS — I4891 Unspecified atrial fibrillation: Secondary | ICD-10-CM | POA: Insufficient documentation

## 2013-07-02 DIAGNOSIS — Z792 Long term (current) use of antibiotics: Secondary | ICD-10-CM | POA: Insufficient documentation

## 2013-07-02 DIAGNOSIS — H8110 Benign paroxysmal vertigo, unspecified ear: Secondary | ICD-10-CM

## 2013-07-02 LAB — PROTIME-INR
INR: 1.99 — ABNORMAL HIGH (ref 0.00–1.49)
Prothrombin Time: 22 seconds — ABNORMAL HIGH (ref 11.6–15.2)

## 2013-07-02 MED ORDER — PREDNISONE 20 MG PO TABS
20.0000 mg | ORAL_TABLET | Freq: Once | ORAL | Status: AC
  Administered 2013-07-02: 20 mg via ORAL
  Filled 2013-07-02: qty 1

## 2013-07-02 MED ORDER — MECLIZINE HCL 25 MG PO TABS
25.0000 mg | ORAL_TABLET | Freq: Once | ORAL | Status: AC
  Administered 2013-07-02: 25 mg via ORAL
  Filled 2013-07-02: qty 1

## 2013-07-02 NOTE — ED Notes (Signed)
Pt was seen here last nite for elevated HR 180s related to Levaquin and Flecanide interaction.  Pt states went to bed at 0000 and woke up feeling like she was in a different world and off balance.  Pt is on coumadin for afib

## 2013-07-02 NOTE — ED Notes (Signed)
PT to MRI

## 2013-07-04 NOTE — ED Provider Notes (Signed)
I saw and evaluated the patient, reviewed the resident's note and I agree with the findings and plan.   EKG Interpretation   Date/Time:  Wednesday July 01 2013 21:08:14 EDT Ventricular Rate:  80 PR Interval:  130 QRS Duration: 90 QT Interval:  365 QTC Calculation: 421 R Axis:   29 Text Interpretation:  Sinus rhythm Atrial premature complex Anteroseptal  infarct, old No significant change was found Confirmed by Orah Sonnen  MD,  Caryn BeeKEVIN ([id number]) on 07/02/2013 1:02:32 AM      Presents with afib with RVR. Converted with cardizem. Will place back on her flecanide. Stop levaquin. Suspect bronchitis, and not PNA. Cardiology follow up as needed  Lyanne CoKevin M Andon Villard, MD 07/04/13 [phone]

## 2013-07-05 NOTE — ED Provider Notes (Signed)
CSN: [id number]     Arrival date & time 07/02/13  1133 History   First MD Initiated Contact with Patient 07/02/13 1153     Chief Complaint  Patient presents with  . Dizziness     ) HPI Patient had been seen before with atrial fibrillation with RVR.  Comes in this morning with some positional vertigo.  No significant vomiting.  Patient's had BPV in the past.  Denies headache, chest pain, short of breath, diaphoresis. Past Medical History  Diagnosis Date  . Stress fracture of right foot 12/06/2010    NO PROBLEM NOW  . Anxiety   . GERD (gastroesophageal reflux disease)   . Arthritis   . Hypertension     per Dr Katrinka BlazingSmith OV note,/ ESSENTIAL  . Warfarin anticoagulation   . Pain     LEFT KNEE - SINCE A FALL ABOUT May 15, 2012 -- STATES XRAYS SHOWED LEFT KNEE REPLACEMENT WAS OK - BUT PT STILL HAVING PAIN AND SWELLING  . Pain     LEFT SHOULDER PAIN - SINCE FALL May 15, 2012 - TORE ROTATOR CUFF  . Arrhythmia     ATRIAL FIB- RHYTHM CONTROL WITH FLECAINIDE.  CHRONIC WARFARIN  . A-fib   . Hyperlipidemia   . Depression     PT'S HUSBAND AND SON HAVE DIED W/IN PAST YEAR 2013  . Stroke 1/13    PER CT- age related atrophy with mild small vessel ischemic change- no defecits AND PT STATES SHE WAS NEVER AWARE OF HAVING HAD STROKE  . Thyroid disease    Past Surgical History  Procedure Laterality Date  . Bunionectomy      left and right with hammer toe repair  . Appendectomy    . Oophorectomy    . Rotator cuff repair  1/13,  3/13    x 3  right/  . Foot surgery    . Salpingectomy    . Bladder surgery    . Nasal sinus surgery    . Knee arthroscopy  02/09/2011    Procedure: ARTHROSCOPY KNEE;  Surgeon: Jacki Conesonald A Gioffre;  Location: [facility];  Service: Orthopedics;  Laterality: Left;  Left Knee Arthroscopy with Menisectomy medial, abrasion chondroplasty medial, and synovectomy, supra patella pouch.  . Back surgery      lumbar  . Eye surgery      bilateral cataract extraction with IOL  . Total  knee arthroplasty  09/05/2011    Procedure: TOTAL KNEE ARTHROPLASTY;  Surgeon: Jacki Conesonald A Gioffre, MD;  Location: [facility];  Service: Orthopedics;  Laterality: Left;  . Shoulder open rotator cuff repair Left 06/26/2012    Procedure: LEFT SHOULDER ROTATOR CUFF REPAIR WITH ANCHORS ;  Surgeon: Jacki Conesonald A Gioffre, MD;  Location: [facility];  Service: Orthopedics;  Laterality: Left;   Family History  Problem Relation Age of Onset  . Diabetes Mother   . Diabetes Son    History  Substance Use Topics  . Smoking status: Former Smoker -- 0.50 packs/day for 10 years    Types: Cigarettes    Quit date: 02/04/1989  . Smokeless tobacco: Not on file  . Alcohol Use: No     Comment: socially   OB History   Grav Para Term Preterm Abortions TAB SAB Ect Mult Living                 Review of Systems  All other systems reviewed and are negative  Allergies  Review of patient's allergies indicates no known allergies.  Home Medications  Prior to Admission medications   Medication Sig Start Date End Date Taking? Authorizing Provider  albuterol (PROVENTIL HFA;VENTOLIN HFA) 108 (90 BASE) MCG/ACT inhaler Inhale 1-2 puffs into the lungs every 6 (six) hours as needed for wheezing or shortness of breath. 06/29/13  Yes Loma Boston, MD  aspirin EC 81 MG tablet Take 81 mg by mouth daily.   Yes Historical Provider, MD  atorvastatin (LIPITOR) 40 MG tablet Take 40 mg by mouth every evening.    Yes Historical Provider, MD  beta carotene w/minerals (OCUVITE) tablet Take 1 tablet by mouth daily with breakfast.    Yes Historical Provider, MD  calcium carbonate (OS-CAL) 600 MG TABS Take 1,800 mg by mouth daily with breakfast.    Yes Historical Provider, MD  cholecalciferol (VITAMIN D) 1000 UNITS tablet Take 1,000 Units by mouth 2 (two) times daily.    Yes Historical Provider, MD  esomeprazole (NEXIUM) 20 MG capsule Take 20 mg by mouth every morning.    Yes Historical Provider, MD  flecainide (TAMBOCOR) 100 MG tablet Take 100  mg by mouth 2 (two) times daily.   Yes Historical Provider, MD  HYDROcodone-homatropine (HYCODAN) 5-1.5 MG/5ML syrup Take 5 mLs by mouth every 6 (six) hours as needed for cough.   Yes Historical Provider, MD  Polyethyl Glycol-Propyl Glycol (SYSTANE OP) Apply 1 drop to eye daily as needed (for dry eyes).    Yes Historical Provider, MD  polyethylene glycol (MIRALAX / GLYCOLAX) packet Take 17 g by mouth every other day.    Yes Historical Provider, MD  predniSONE (DELTASONE) 10 MG tablet Take 4 tablets (40 mg total) by mouth daily. 06/30/13  Yes Loma Boston, MD  warfarin (COUMADIN) 2.5 MG tablet Take 2.5-5 mg by mouth every morning. Take 2.5 mg by mouth Monday- Saturday.  Take 5 mg by mouth on Sunday.   Yes Historical Provider, MD  levofloxacin (LEVAQUIN) 500 MG tablet Take 500 mg by mouth daily.  06/25/13   Historical Provider, MD   BP 116/99  Pulse 60  Temp(Src) 97.8 F (36.6 C) (Oral)  Resp 16  SpO2 99% Physical Exam  Nursing note and vitals reviewed. Constitutional: She is oriented to person, place, and time. She appears well-developed and well-nourished. No distress.  HENT:  Head: Normocephalic and atraumatic.  Eyes: Pupils are equal, round, and reactive to light.  Neck: Normal range of motion.  Cardiovascular: Normal rate and intact distal pulses.   Pulmonary/Chest: No respiratory distress.  Abdominal: Normal appearance. She exhibits no distension.  Musculoskeletal: Normal range of motion.  Neurological: She is alert and oriented to person, place, and time. She has normal strength. No cranial nerve deficit or sensory deficit. GCS eye subscore is 4. GCS verbal subscore is 5. GCS motor subscore is 6.  Some nystagmus with rapid head movement.  Seems to fatigue after 2-3 seconds.  Skin: Skin is warm and dry. No rash noted.  Psychiatric: She has a normal mood and affect. Her behavior is normal.    ED Course  Procedures (including critical care time)  After treatment in the ED the  patient feels back to baseline and wants to go home. Labs Review Labs Reviewed  PROTIME-INR - Abnormal; Notable for the following:    Prothrombin Time 22.0 (*)    INR 1.99 (*)    All other components within normal limits    Imaging Review     MR Brain Wo Contrast (Final result)  Result time: 07/02/13 13:43:38    Final result by Rad  Results In Interface (07/02/13 13:43:38)    Narrative:   CLINICAL DATA: Slurred speech and dizziness.  EXAM: MRI HEAD WITHOUT CONTRAST  TECHNIQUE: Multiplanar, multiecho pulse sequences of the brain and surrounding structures were obtained without intravenous contrast.  COMPARISON: Head CT 03/29/2011  FINDINGS: There is no evidence of acute infarct or intracranial hemorrhage. Remote lacunar infarct is noted in the left midbrain. Dilated perivascular spaces versus remote lacunar infarcts in the basal ganglia. Patchy periventricular white matter T2 hyperintensities are compatible with minimal chronic small vessel ischemic disease. Ventricles and sulci are within normal limits for age. There is no evidence of mass, midline shift, or extra-axial fluid collection.  Prior bilateral cataract surgery is noted. The maxillary sinuses are hypoplastic and partially opacified. Mastoid air cells are clear. Major intracranial vascular flow voids are preserved.  IMPRESSION: 1. No evidence of acute intracranial abnormality. 2. Chronic small vessel ischemic disease.       EKG Interpretation   Date/Time:  Thursday July 02 2013 11:36:44 EDT Ventricular Rate:  60 PR Interval:  136 QRS Duration: 92 QT Interval:  440 QTC Calculation: 440 R Axis:   73 Text Interpretation:  Normal sinus rhythm Normal ECG ED PHYSICIAN  INTERPRETATION AVAILABLE IN [facility] Confirmed by TEST, Record  ([id number]) on 07/04/2013 12:10:44 PM      MDM   Final diagnoses:  BPV (benign positional vertigo)         Nelia Shiobert L Doreen Garretson, MD 07/05/13 [phone]

## 2013-08-10 ENCOUNTER — Other Ambulatory Visit: Payer: Self-pay

## 2013-08-10 ENCOUNTER — Ambulatory Visit (INDEPENDENT_AMBULATORY_CARE_PROVIDER_SITE_OTHER): Payer: Medicare Other | Admitting: Interventional Cardiology

## 2013-08-10 VITALS — BP 118/74 | HR 66 | Ht 66.0 in | Wt 145.0 lb

## 2013-08-10 DIAGNOSIS — I480 Paroxysmal atrial fibrillation: Secondary | ICD-10-CM

## 2013-08-10 DIAGNOSIS — R0609 Other forms of dyspnea: Secondary | ICD-10-CM

## 2013-08-10 DIAGNOSIS — I5032 Chronic diastolic (congestive) heart failure: Secondary | ICD-10-CM

## 2013-08-10 DIAGNOSIS — R0989 Other specified symptoms and signs involving the circulatory and respiratory systems: Secondary | ICD-10-CM

## 2013-08-10 DIAGNOSIS — I4891 Unspecified atrial fibrillation: Secondary | ICD-10-CM

## 2013-08-10 DIAGNOSIS — Z79899 Other long term (current) drug therapy: Secondary | ICD-10-CM

## 2013-08-10 DIAGNOSIS — Z5181 Encounter for therapeutic drug level monitoring: Secondary | ICD-10-CM

## 2013-08-10 DIAGNOSIS — R0600 Dyspnea, unspecified: Secondary | ICD-10-CM

## 2013-08-10 MED ORDER — SPIRONOLACTONE 12.5 MG HALF TABLET: 12.5000 mg | ORAL_TABLET | Freq: Every day | ORAL | Status: DC

## 2013-08-10 MED ORDER — SPIRONOLACTONE 25 MG PO TABS: 12.5000 mg | ORAL_TABLET | Freq: Every day | ORAL | Status: DC

## 2013-08-10 NOTE — Progress Notes (Signed)
Patient ID: Ann Rodriguez, female   DOB: 09/13/1928, 80 y.o.   MRN: [id number]    [address] [place], [place]  [address] Phone: [phone] Fax:  [phone]  Date:  08/10/2013   ID:  Ann Rodriguez, DOB 04/04/1928, MRN [id number]  PCP:  Sissy HoffSWAYNE,DAVID W, MD   ASSESSMENT:  1. Paroxysmal atrial fibrillation, symptomatic and requiring emergency room visit occurring with patient off flecainide while on Levaquin for URI 2. Chronic anticoagulation therapy followed by PCP 3. Hypertension 4. Dyspnea interfering with lifestyle. Suspect diastolic dysfunction/chronic diastolic heart failure. [facility] 1 diastolic dysfunction.  PLAN:  1. Aldactone 12.5 mg daily 2. Clinical followup in 3 months 3. Continue the flecainide as ordered 4. Basic metabolic panel a month   SUBJECTIVE: Ann Rodriguez is a 80 y.o. female had an emergency room visit and treatment of atrial fibrillation after being taken off of flecainide while on Levaquin. IV diltiazem lead II spontaneous conversion. Flecainide was restarted and she has had no recurrences. Her main complaint and a continuing concern his exertional dyspnea. She denies orthopnea, lower extremity swelling, and PND. There is no chest discomfort. She did not have chest discomfort with atrial fibrillation with a rate was greater than 180 per   Wt Readings from Last 3 Encounters:  08/10/13 145 lb (65.772 kg)  07/01/13 141 lb (63.957 kg)  06/29/13 145 lb (65.772 kg)     Past Medical History  Diagnosis Date  . Stress fracture of right foot 12/06/2010    NO PROBLEM NOW  . Anxiety   . GERD (gastroesophageal reflux disease)   . Arthritis   . Hypertension     per Dr Katrinka BlazingSmith OV note,/ ESSENTIAL  . Warfarin anticoagulation   . Pain     LEFT KNEE - SINCE A FALL ABOUT May 15, 2012 -- STATES XRAYS SHOWED LEFT KNEE REPLACEMENT WAS OK - BUT PT STILL HAVING PAIN AND SWELLING  . Pain     LEFT SHOULDER  PAIN - SINCE FALL May 15, 2012 - TORE ROTATOR CUFF  . Arrhythmia     ATRIAL FIB- RHYTHM CONTROL WITH FLECAINIDE.  CHRONIC WARFARIN  . A-fib   . Hyperlipidemia   . Depression     PT'S HUSBAND AND SON HAVE DIED W/IN PAST YEAR 2013  . Stroke 1/13    PER CT- age related atrophy with mild small vessel ischemic change- no defecits AND PT STATES SHE WAS NEVER AWARE OF HAVING HAD STROKE  . Thyroid disease     Current Outpatient Prescriptions  Medication Sig Dispense Refill  . aspirin EC 81 MG tablet Take 81 mg by mouth daily.      Marland Kitchen. atorvastatin (LIPITOR) 40 MG tablet Take 40 mg by mouth every evening.       . beta carotene w/minerals (OCUVITE) tablet Take 1 tablet by mouth daily with breakfast.       . calcium carbonate (OS-CAL) 600 MG TABS Take 1,800 mg by mouth daily with breakfast.       . cholecalciferol (VITAMIN D) 1000 UNITS tablet Take 1,000 Units by mouth 2 (two) times daily.       Marland Kitchen. esomeprazole (NEXIUM) 20 MG capsule Take 20 mg by mouth every morning.       . flecainide (TAMBOCOR) 100 MG tablet Take 100 mg by mouth 2 (two) times daily.      . fluticasone (FLONASE) 50 MCG/ACT nasal spray       .  meclizine (ANTIVERT) 25 MG tablet       . oxyCODONE-acetaminophen (PERCOCET/ROXICET) 5-325 MG per tablet Take by mouth every 4 (four) hours as needed for severe pain.      . polyethylene glycol (MIRALAX / GLYCOLAX) packet Take 17 g by mouth every other day.       . warfarin (COUMADIN) 2.5 MG tablet Take 2.5-5 mg by mouth every morning. Take 2.5 mg by mouth Monday- Saturday.  Take 5 mg by mouth on Sunday.       No current facility-administered medications for this visit.    Allergies:   No Known Allergies  Social History:  The patient  reports that she quit smoking about 24 years ago. Her smoking use included Cigarettes. She has a 5 pack-year smoking history. She does not have any smokeless tobacco history on file. She reports that she does not drink alcohol or use illicit drugs.   ROS:   Please see the history of present illness.   No blood in the urine or stool   All other systems reviewed and negative.   OBJECTIVE: VS:  BP 118/74  Pulse 66  Ht 5\' 6"  (1.676 m)  Wt 145 lb (65.772 kg)  BMI 23.41 kg/m2 Well nourished, well developed, in no acute distress, elderly but healthy appearing HEENT: normal Neck: JVD flat. Carotid bruit absent  Cardiac:  normal S1, S2; RRR; no murmur Lungs:  clear to auscultation bilaterally, no wheezing, rhonchi or rales Abd: soft, nontender, no hepatomegaly Ext: Edema absent. Pulses 2+ and symmetric Skin: warm and dry Neuro:  CNs 2-12 intact, no focal abnormalities noted  EKG:  Not repeated       Signed, Darci Needle III, MD 08/10/2013 9:08 AM

## 2013-08-10 NOTE — Patient Instructions (Signed)
Your physician has recommended you make the following change in your medication:  1) START Aldactone 1/2 Tablet 12.5mg  Daily. An Rx has been sent to your pharmacy.  Your physician recommends that you return for lab work on 09/09/13 between 7:30am-5:15pm  You have a follow up appointment scheduled for 11/10/13 @ 3:15pm

## 2013-08-11 ENCOUNTER — Encounter: Payer: Self-pay | Admitting: Interventional Cardiology

## 2013-08-11 DIAGNOSIS — Z79899 Other long term (current) drug therapy: Secondary | ICD-10-CM | POA: Insufficient documentation

## 2013-08-11 DIAGNOSIS — Z5181 Encounter for therapeutic drug level monitoring: Secondary | ICD-10-CM | POA: Insufficient documentation

## 2013-08-11 DIAGNOSIS — I5032 Chronic diastolic (congestive) heart failure: Secondary | ICD-10-CM | POA: Insufficient documentation

## 2013-09-07 ENCOUNTER — Other Ambulatory Visit (INDEPENDENT_AMBULATORY_CARE_PROVIDER_SITE_OTHER): Payer: Medicare Other

## 2013-09-07 DIAGNOSIS — I5032 Chronic diastolic (congestive) heart failure: Secondary | ICD-10-CM

## 2013-09-07 LAB — BASIC METABOLIC PANEL
BUN: 17 mg/dL (ref 6–23)
CO2: 28 meq/L (ref 19–32)
Calcium: 9.2 mg/dL (ref 8.4–10.5)
Chloride: 105 mEq/L (ref 96–112)
Creatinine, Ser: 0.6 mg/dL (ref 0.4–1.2)
GFR: 94.21 mL/min (ref >60.00)
GLUCOSE: 96 mg/dL (ref 70–99)
POTASSIUM: 4.2 meq/L (ref 3.5–5.1)
Sodium: 139 mEq/L (ref 135–145)

## 2013-09-09 ENCOUNTER — Other Ambulatory Visit: Payer: Medicare Other

## 2013-09-14 ENCOUNTER — Telehealth: Payer: Self-pay | Admitting: *Deleted

## 2013-09-14 MED ORDER — FLECAINIDE ACETATE 100 MG PO TABS: 100.0000 mg | ORAL_TABLET | Freq: Two times a day (BID) | ORAL | Status: DC

## 2013-09-14 NOTE — Telephone Encounter (Signed)
Patient out of medication [facility]ainide, [facility] and also [facility] done

## 2013-09-16 ENCOUNTER — Telehealth: Payer: Self-pay

## 2013-09-16 NOTE — Telephone Encounter (Signed)
Message copied by Jarvis NewcomerPARRIS-GODLEY, Tin Engram S on Wed Sep 16, 2013  2:38 PM ------      Message from: Verdis PrimeSMITH, HENRY      Created: Mon Sep 14, 2013  6:04 PM       Labs are normal. No change in therapy needed. ------

## 2013-09-16 NOTE — Telephone Encounter (Signed)
pt aware of lab results.Labs are normal. No change in therapy needed..pt verbalized understanding.

## 2013-11-10 ENCOUNTER — Ambulatory Visit (INDEPENDENT_AMBULATORY_CARE_PROVIDER_SITE_OTHER): Payer: Medicare Other | Admitting: Interventional Cardiology

## 2013-11-10 ENCOUNTER — Encounter: Payer: Self-pay | Admitting: Interventional Cardiology

## 2013-11-10 VITALS — BP 102/70 | HR 70 | Ht 66.0 in | Wt 144.8 lb

## 2013-11-10 DIAGNOSIS — R0600 Dyspnea, unspecified: Secondary | ICD-10-CM

## 2013-11-10 DIAGNOSIS — Z5181 Encounter for therapeutic drug level monitoring: Secondary | ICD-10-CM

## 2013-11-10 DIAGNOSIS — Z79899 Other long term (current) drug therapy: Secondary | ICD-10-CM

## 2013-11-10 DIAGNOSIS — R0609 Other forms of dyspnea: Secondary | ICD-10-CM

## 2013-11-10 DIAGNOSIS — R0989 Other specified symptoms and signs involving the circulatory and respiratory systems: Secondary | ICD-10-CM

## 2013-11-10 DIAGNOSIS — I5032 Chronic diastolic (congestive) heart failure: Secondary | ICD-10-CM

## 2013-11-10 DIAGNOSIS — I480 Paroxysmal atrial fibrillation: Secondary | ICD-10-CM

## 2013-11-10 DIAGNOSIS — I4891 Unspecified atrial fibrillation: Secondary | ICD-10-CM

## 2013-11-10 NOTE — Progress Notes (Signed)
Patient ID: Ann Rodriguez, female   DOB: [birth year], 80 y.o.   MRN: [id number]    [address] [place], [place]  [address] Phone: [phone] Fax:  [phone]  Date:  11/10/2013   ID:  Ann Rodriguez, DOB 1929/01/09, MRN [id number]  PCP:  Sissy Hoff, MD   ASSESSMENT:  1. Paroxysmal atrial fibrillation controlled on flecainide 2. Hypertension 3. Flecainide therapy without complications 4. Dyspnea.  Note she does have scoliosis and perhaps developing restrictive lung physiology. Dyspnea was not improved with low dose Aldactone  PLAN:  1. Continue to monitor dyspnea. I will have further increased diuretic regimen and in fact will discontinue Aldactone. There is no improvement with gentle diuresis. I cannot be absolutely sure the 12 12 days of therapy did any good 2. Notified to call if chest pain or syncope. 3. She is to notify if increasing dyspnea Blood pressure is well controlled 4. Clinical followup in one year   SUBJECTIVE: Ann Rodriguez is a 80 y.o. female who is doing about the same. Still has dyspnea with mild to moderate physical activity. All the other hand she can exert during water aerobics and have no dyspnea. She denies chest pain. There is no orthopnea or peripheral edema. A brain natruretic peptide was unremarkable.   Wt Readings from Last 3 Encounters:  11/10/13 144 lb 12.8 oz (65.681 kg)  08/10/13 145 lb (65.772 kg)  07/01/13 141 lb (63.957 kg)     Past Medical History  Diagnosis Date  . Stress fracture of right foot 12/06/2010    NO PROBLEM NOW  . Anxiety   . GERD (gastroesophageal reflux disease)   . Arthritis   . Hypertension     per Dr Katrinka Blazing OV note,/ ESSENTIAL  . Warfarin anticoagulation   . Pain     LEFT KNEE - SINCE A FALL ABOUT May 15, 2012 -- STATES XRAYS SHOWED LEFT KNEE REPLACEMENT WAS OK - BUT PT STILL HAVING PAIN AND SWELLING  . Pain     LEFT SHOULDER PAIN - SINCE FALL May 15, 2012 - TORE ROTATOR CUFF  . Arrhythmia    ATRIAL FIB- RHYTHM CONTROL WITH FLECAINIDE.  CHRONIC WARFARIN  . A-fib   . Hyperlipidemia   . Depression     PT'S HUSBAND AND SON HAVE DIED W/IN PAST YEAR 08/10/2011  . Stroke 1/13    PER CT- age related atrophy with mild small vessel ischemic change- no defecits AND PT STATES SHE WAS NEVER AWARE OF HAVING HAD STROKE  . Thyroid disease     Current Outpatient Prescriptions  Medication Sig Dispense Refill  . aspirin EC 81 MG tablet Take 81 mg by mouth daily.      Marland Kitchen atorvastatin (LIPITOR) 40 MG tablet Take 40 mg by mouth every evening.       . beta carotene w/minerals (OCUVITE) tablet Take 1 tablet by mouth daily with breakfast.       . calcium carbonate (OS-CAL) 600 MG TABS Take 1,800 mg by mouth daily with breakfast.       . cephALEXin (KEFLEX) 500 MG capsule       . cholecalciferol (VITAMIN D) 1000 UNITS tablet Take 1,000 Units by mouth 2 (two) times daily.       Marland Kitchen esomeprazole (NEXIUM) 20 MG capsule Take 20 mg by mouth every morning.       . flecainide (TAMBOCOR) 100 MG tablet Take 1 tablet (100 mg total) by mouth 2 (two) times daily.  180 tablet  2  . fluticasone (FLONASE) 50 MCG/ACT nasal spray       . HYDROcodone-acetaminophen (NORCO/VICODIN) 5-325 MG per tablet Take 1 tablet by mouth every 6 (six) hours as needed.       . meclizine (ANTIVERT) 25 MG tablet Take 25 mg by mouth as needed for dizziness.       Marland Kitchen oxyCODONE-acetaminophen (PERCOCET/ROXICET) 5-325 MG per tablet Take by mouth every 4 (four) hours as needed for severe pain.      . polyethylene glycol (MIRALAX / GLYCOLAX) packet Take 17 g by mouth every other day.       . predniSONE (STERAPRED UNI-PAK) 5 MG TABS tablet       . spironolactone (ALDACTONE) 25 MG tablet Take 0.5 tablets (12.5 mg total) by mouth daily.  15 tablet  5  . warfarin (COUMADIN) 2.5 MG tablet Take 2.5-5 mg by mouth every morning. Take 2.5 mg by mouth Monday- Saturday.  Take 5 mg by mouth on Sunday.       No current facility-administered medications for this  visit.    Allergies:   No Known Allergies  Social History:  The patient  reports that she quit smoking about 24 years ago. Her smoking use included Cigarettes. She has a 5 pack-year smoking history. She does not have any smokeless tobacco history on file. She reports that she does not drink alcohol or use illicit drugs.   ROS:  Please see the history of present illness.   Denies edema. No orthopnea. No prolonged palpitations. No recent stroke or strokelike symptoms   All other systems reviewed and negative.   OBJECTIVE: VS:  BP 102/70  Pulse 70  Ht  (1.676 m)  Wt 144 lb 12.8 oz (65.681 kg)  BMI 23.38 kg/m2 Well nourished, well developed, in no acute distress, elderly and stable appearing HEENT: normal Neck: JVD flat. Carotid bruit absent  Cardiac:  normal S1, S2; RRR; no murmur Lungs:  clear to auscultation bilaterally, no wheezing, rhonchi or rales Abd: soft, nontender, no hepatomegaly Ext: Edema absent. Pulses 2+ Skin: warm and dry Neuro:  CNs 2-12 intact, no focal abnormalities noted  EKG:  Not performed       Signed, Darci Needle III, MD 11/10/2013 3:44 PM

## 2013-11-10 NOTE — Patient Instructions (Signed)
Your physician has recommended you make the following change in your medication:  1) STOP Aldactone  Take all other medications as prescribed  Your physician wants you to follow-up in: 1 year with Dr.Smith You will receive a reminder letter in the mail two months in advance. If you don't receive a letter, please call our office to schedule the follow-up appointment.

## 2014-05-28 ENCOUNTER — Other Ambulatory Visit: Payer: Self-pay

## 2014-05-28 MED ORDER — FLECAINIDE ACETATE 100 MG PO TABS: 100.0000 mg | ORAL_TABLET | Freq: Two times a day (BID) | ORAL | Status: DC

## 2014-07-28 ENCOUNTER — Other Ambulatory Visit (HOSPITAL_COMMUNITY): Payer: Self-pay | Admitting: *Deleted

## 2014-07-29 ENCOUNTER — Ambulatory Visit (HOSPITAL_COMMUNITY)
Admission: RE | Admit: 2014-07-29 | Discharge: 2014-07-29 | Disposition: A | Discharge status: Home / Self Care | Payer: Medicare Other | Source: Ambulatory Visit | Attending: Family Medicine | Admitting: Family Medicine

## 2014-07-29 DIAGNOSIS — M810 Age-related osteoporosis without current pathological fracture: Secondary | ICD-10-CM | POA: Diagnosis not present

## 2014-07-29 MED ORDER — ZOLEDRONIC ACID 5 MG/100ML IV SOLN
INTRAVENOUS | Status: AC
Start: 2014-07-29 — End: 2014-07-29
  Administered 2014-07-29: 5 mg
  Filled 2014-07-29: qty 100

## 2014-07-29 MED ORDER — ZOLEDRONIC ACID 5 MG/100ML IV SOLN: 5.0000 mg | Freq: Once | INTRAVENOUS | Status: DC

## 2014-07-29 NOTE — Discharge Instructions (Signed)

## 2014-09-10 DIAGNOSIS — I471 Supraventricular tachycardia, unspecified: Secondary | ICD-10-CM

## 2014-09-10 HISTORY — DX: Supraventricular tachycardia: I47.1

## 2014-09-10 HISTORY — DX: Supraventricular tachycardia, unspecified: I47.10

## 2014-09-28 ENCOUNTER — Telehealth: Payer: Self-pay | Admitting: Interventional Cardiology

## 2014-09-28 ENCOUNTER — Inpatient Hospital Stay (HOSPITAL_COMMUNITY)
Admission: EM | Admit: 2014-09-28 | Discharge: 2014-09-29 | DRG: 309 | Disposition: A | Discharge status: Home / Self Care | Payer: Medicare Other | Attending: Internal Medicine | Admitting: Internal Medicine

## 2014-09-28 ENCOUNTER — Emergency Department (HOSPITAL_COMMUNITY): Payer: Medicare Other

## 2014-09-28 ENCOUNTER — Encounter: Payer: Self-pay | Admitting: Physician Assistant

## 2014-09-28 ENCOUNTER — Encounter (HOSPITAL_COMMUNITY): Payer: Self-pay | Admitting: Emergency Medicine

## 2014-09-28 ENCOUNTER — Ambulatory Visit (INDEPENDENT_AMBULATORY_CARE_PROVIDER_SITE_OTHER): Payer: Medicare Other | Admitting: Physician Assistant

## 2014-09-28 VITALS — BP 100/60 | HR 65 | Ht 66.0 in | Wt 148.4 lb

## 2014-09-28 DIAGNOSIS — Z87891 Personal history of nicotine dependence: Secondary | ICD-10-CM

## 2014-09-28 DIAGNOSIS — Z7982 Long term (current) use of aspirin: Secondary | ICD-10-CM

## 2014-09-28 DIAGNOSIS — I10 Essential (primary) hypertension: Secondary | ICD-10-CM | POA: Diagnosis present

## 2014-09-28 DIAGNOSIS — I471 Supraventricular tachycardia: Principal | ICD-10-CM | POA: Diagnosis present

## 2014-09-28 DIAGNOSIS — E785 Hyperlipidemia, unspecified: Secondary | ICD-10-CM | POA: Diagnosis present

## 2014-09-28 DIAGNOSIS — I5032 Chronic diastolic (congestive) heart failure: Secondary | ICD-10-CM

## 2014-09-28 DIAGNOSIS — Z7901 Long term (current) use of anticoagulants: Secondary | ICD-10-CM

## 2014-09-28 DIAGNOSIS — R002 Palpitations: Secondary | ICD-10-CM | POA: Diagnosis not present

## 2014-09-28 DIAGNOSIS — I480 Paroxysmal atrial fibrillation: Secondary | ICD-10-CM | POA: Diagnosis present

## 2014-09-28 DIAGNOSIS — M1990 Unspecified osteoarthritis, unspecified site: Secondary | ICD-10-CM | POA: Diagnosis present

## 2014-09-28 HISTORY — DX: Supraventricular tachycardia: I47.1

## 2014-09-28 LAB — CBC
HEMATOCRIT: 42.3 % (ref 36.0–46.0)
HEMOGLOBIN: 13.9 g/dL (ref 12.0–15.0)
MCH: 30.6 pg (ref 26.0–34.0)
MCHC: 32.9 g/dL (ref 30.0–36.0)
MCV: 93.2 fL (ref 78.0–100.0)
Platelets: 292 10*3/uL (ref 150–400)
RBC: 4.54 MIL/uL (ref 3.87–5.11)
RDW: 14.7 % (ref 11.5–15.5)
WBC: 6.2 10*3/uL (ref 4.0–10.5)

## 2014-09-28 LAB — BASIC METABOLIC PANEL
ANION GAP: 6 (ref 5–15)
BUN: 18 mg/dL (ref 6–20)
CO2: 27 mmol/L (ref 22–32)
Calcium: 9.1 mg/dL (ref 8.9–10.3)
Chloride: 106 mmol/L (ref 101–111)
Creatinine, Ser: 0.78 mg/dL (ref 0.44–1.00)
GFR calc non Af Amer: >60 mL/min (ref >60)
GLUCOSE: 121 mg/dL — AB (ref 65–99)
POTASSIUM: 4.1 mmol/L (ref 3.5–5.1)
Sodium: 139 mmol/L (ref 135–145)

## 2014-09-28 LAB — I-STAT TROPONIN, ED: Troponin i, poc: 0.06 ng/mL (ref 0.00–0.08)

## 2014-09-28 MED ORDER — HYDRALAZINE HCL 20 MG/ML IJ SOLN
INTRAMUSCULAR | Status: AC
  Filled 2014-09-28: qty 1

## 2014-09-28 MED ORDER — ADENOSINE 6 MG/2ML IV SOLN
INTRAVENOUS | Status: AC
  Filled 2014-09-28: qty 10

## 2014-09-28 MED ORDER — ADENOSINE 6 MG/2ML IV SOLN
6.0000 mg | Freq: Once | INTRAVENOUS | Status: AC
  Administered 2014-09-28: 6 mg via INTRAVENOUS

## 2014-09-28 NOTE — Patient Instructions (Signed)
Medication Instructions:  Your physician recommends that you continue on your current medications as directed. Please refer to the Current Medication list given to you today.  Labwork: none  Testing/Procedures: none  Follow-Up: Your physician recommends that you schedule a follow-up appointment 1st available with Dr. Katrinka BlazingSmith.   Any Other Special Instructions Will Be Listed Below (If Applicable).

## 2014-09-28 NOTE — Progress Notes (Signed)
Cardiology Office Note   Date:  09/28/2014   ID:  Ann Rodriguez, DOB 1929-01-11, MRN [id number]  PCP:  Sissy Hoff, MD  Cardiologist:  Dr Vicente Serene, PA-C   Palpitations  History of Present Illness: Ann Rodriguez is a 81 y.o. female with a history of PAF on Flecainide and coumadin, HTN, DOE.   Ann Rodriguez presents for evaluation of palpitations. They were her usual atrial fibrillation palpitations. She has been getting episodes 1-2 x year since being started on the Flecainide. They will last about 30 minutes and resolve spontaneously or with ValSalva. She does not get chest pain, SOB or light-headed.   Yesterday, she had onset of palpitations while sitting doing the checkbook. The palpitations continued all day/night. She slept very little. She felt light-headed at times, but would improve if she laid down. She did not have chest pain. She had fatigue and had some DOE. This am, she finally decided to call the office as she did not wish to go to the ER. Just prior to leaving the house to come in, she felt herself convert. She still feels tired, but otherwise is asymptomatic.   Of note, today her BP is a little lower than usual, she did not feel like eating breakfast this am, so had only a few crackers. She got a little light-headed during the exam.  Past Medical History  Diagnosis Date  . Stress fracture of right foot 12/06/2010    NO PROBLEM NOW  . Anxiety   . GERD (gastroesophageal reflux disease)   . Arthritis   . Hypertension     per Dr Katrinka Blazing OV note,/ ESSENTIAL  . Warfarin anticoagulation   . Pain     LEFT KNEE - SINCE A FALL ABOUT May 15, 2012 -- STATES XRAYS SHOWED LEFT KNEE REPLACEMENT WAS OK - BUT PT STILL HAVING PAIN AND SWELLING  . Pain     LEFT SHOULDER PAIN - SINCE FALL May 15, 2012 - TORE ROTATOR CUFF  . Arrhythmia     ATRIAL FIB- RHYTHM CONTROL WITH FLECAINIDE.  CHRONIC WARFARIN  . A-fib   . Hyperlipidemia   . Depression     PT'S HUSBAND AND  SON HAVE DIED W/IN PAST YEAR 07/21/11  . Stroke 1/13    PER CT- age related atrophy with mild small vessel ischemic change- no defecits AND PT STATES SHE WAS NEVER AWARE OF HAVING HAD STROKE  . Thyroid disease     Past Surgical History  Procedure Laterality Date  . Bunionectomy      left and right with hammer toe repair  . Appendectomy    . Oophorectomy    . Rotator cuff repair  1/13,  3/13    x 3  right/  . Foot surgery    . Salpingectomy    . Bladder surgery    . Nasal sinus surgery    . Knee arthroscopy  02/09/2011    Procedure: ARTHROSCOPY KNEE;  Surgeon: Jacki Cones;  Location: [facility];  Service: Orthopedics;  Laterality: Left;  Left Knee Arthroscopy with Menisectomy medial, abrasion chondroplasty medial, and synovectomy, supra patella pouch.  . Back surgery      lumbar  . Eye surgery      bilateral cataract extraction with IOL  . Total knee arthroplasty  09/05/2011    Procedure: TOTAL KNEE ARTHROPLASTY;  Surgeon: Jacki Cones, MD;  Location: [facility];  Service: Orthopedics;  Laterality: Left;  . Shoulder open rotator  cuff repair Left 06/26/2012    Procedure: LEFT SHOULDER ROTATOR CUFF REPAIR WITH ANCHORS ;  Surgeon: Jacki Conesonald A Gioffre, MD;  Location: [facility];  Service: Orthopedics;  Laterality: Left;    Current Outpatient Prescriptions  Medication Sig Dispense Refill  . aspirin EC 81 MG tablet Take 81 mg by mouth daily.    Marland Kitchen. atorvastatin (LIPITOR) 40 MG tablet Take 40 mg by mouth every evening.     . beta carotene w/minerals (OCUVITE) tablet Take 1 tablet by mouth daily with breakfast.     . calcium carbonate (OS-CAL) 600 MG TABS Take 1,800 mg by mouth daily with breakfast.     . cholecalciferol (VITAMIN D) 1000 UNITS tablet Take 1,000 Units by mouth 2 (two) times daily.     Marland Kitchen. esomeprazole (NEXIUM) 20 MG capsule Take 20 mg by mouth every morning.     . flecainide (TAMBOCOR) 100 MG tablet Take 1 tablet (100 mg total) by mouth 2 (two) times daily. 180 tablet 1  . fluticasone  (FLONASE) 50 MCG/ACT nasal spray Place 1 spray into both nostrils once as needed for allergies.     Marland Kitchen. HYDROcodone-acetaminophen (NORCO/VICODIN) 5-325 MG per tablet Take 1 tablet by mouth every 6 (six) hours as needed for moderate pain.     . meclizine (ANTIVERT) 25 MG tablet Take 25 mg by mouth as needed for dizziness.     Marland Kitchen. oxyCODONE-acetaminophen (PERCOCET/ROXICET) 5-325 MG per tablet Take by mouth every 4 (four) hours as needed for severe pain.    . polyethylene glycol (MIRALAX / GLYCOLAX) packet Take 17 g by mouth every other day.     . warfarin (COUMADIN) 2.5 MG tablet Take 2.5-5 mg by mouth every morning. Take 2.5 mg by mouth Monday- Saturday.  Take 5 mg by mouth on Sunday.     No current facility-administered medications for this visit.    Allergies:   Review of patient's allergies indicates no known allergies.    Social History:  The patient  reports that she quit smoking about 25 years ago. Her smoking use included Cigarettes. She has a 5 pack-year smoking history. She does not have any smokeless tobacco history on file. She reports that she does not drink alcohol or use illicit drugs.   Family History:  The patient's family history includes Diabetes in her mother and son.    ROS:  Please see the history of present illness. All other systems are reviewed and negative.    PHYSICAL EXAM: VS:  BP 100/60 mmHg  Pulse 65  Ht 5\' 6"  (1.676 m)  Wt 148 lb 6.4 oz (67.314 kg)  BMI 23.96 kg/m2  SpO2 97% , BMI Body mass index is 23.96 kg/(m^2). GEN: Well nourished, well developed, in no acute distress HEENT: normal Neck: no JVD, carotid bruits, or masses Cardiac: RRR; no murmurs, rubs, or gallops,no edema  Respiratory:  clear to auscultation bilaterally, normal work of breathing GI: soft, nontender, nondistended, + BS MS: no deformity or atrophy Skin: warm and dry, no rash Neuro:  Strength and sensation are intact Psych: euthymic mood, full affect   EKG:  EKG is ordered today. The  ekg ordered today demonstrates SR, NSST changes, no sig change.   Recent Labs: No results found for requested labs within last 365 days.    Lipid Panel No results found for: CHOL, TRIG, HDL, CHOLHDL, VLDL, LDLCALC, LDLDIRECT   Wt Readings from Last 3 Encounters:  09/28/14 148 lb 6.4 oz (67.314 kg)  07/29/14 143 lb (64.864 kg)  11/10/13 144 lb 12.8 oz (65.681 kg)     Other studies Reviewed: Additional studies/ records that were reviewed today include: Office notes, ECGs.  ASSESSMENT AND PLAN:  1. PAF: pt apparently had a prolonged episode. HR by her measurement was >120 the whole time. Discussed options with the patient.  Offered her an event monitor but she is very reluctant because she would miss water aerobics for a month. A Holter monitor would last long enough. Discussed a loop recorder but she is not excited about that either.   The concern is that the Flecainide is no longer effective and she is having more breakthrough afib than she knows about. Also concerning that she is having another arrhythmia with her description of getting a medication that sounds like Adenosine in the ER. Her Flecainide dose could be increased to 150 mg bid, but will defer this for now. BP not high enough to add a BB/CCB.   Light-headed feeling got better once she drank a glass of water.  Will ask Dr Katrinka Blazing for input on her medications.  2. Chronic anticoagulation: continue this.  Current medicines are reviewed at length with the patient today.  The patient does not have concerns regarding medicines.  The following changes have been made:  no change  Labs/ tests ordered today include:  Orders Placed This Encounter  Procedures  . EKG 12-Lead    Disposition:   FU with Dr Katrinka Blazing.    Tawny Asal  09/28/2014 5:10 PM    [facility] [address] [place], [place], [place]  [address] Phone: [phone]; Fax: [phone]

## 2014-09-28 NOTE — Telephone Encounter (Signed)
Called this morning and said that her heart rate has been irregular and running 130 to 140 since yesterday at 4pm  Typically will go fast and irregular for about an hour and go back to a regular rhythm but it has not this time  Complains of increased shortness of breath.  Able to talk in complete sentences without shortness of breath  Does not know what her blood pressure is.   Discussed with Theodore Demarkhonda Barrett APP PA-C  Instructed patient to come in at 11:30am today for appointment

## 2014-09-28 NOTE — ED Notes (Signed)
Pt. reports palpitations onset yesterday afternoon , denies chest pain  , mild SOB , denies cough or congestion . AFib on the monitor at arrival .

## 2014-09-28 NOTE — ED Provider Notes (Signed)
CSN: [id number]   Arrival date & time 09/28/14 2256  History  This chart was scribed for  Mirian MoMatthew Gentry, MD by Bethel BornBritney McCollum, ED Scribe. This patient was seen in room [facility] and the patient's care was started at 11:17 PM.  Chief Complaint  Patient presents with  . Palpitations    The history is provided by the patient. No language interpreter was used.   Ann Rodriguez is a 81 y.o. female with PMHx of atrial fibrillation on Warfarin who presents to the Emergency Department complaining of racing palpitations with onset yesterday around 4 PM. She had similar symptoms in the past with episodes of a-fib. Her symptoms had resolved yesterday today but returned tonight around 8:30 PM. She saw her doctor around 11:30 AM and her heart rate was around 140 BPM.  Associated symptoms include SOB.  Pt denies chest pain, fever, and headache. No recent medication change.  Past Medical History  Diagnosis Date  . GERD (gastroesophageal reflux disease)   . Arthritis   . A-fib   . Hyperlipidemia   . Depression     PT'S HUSBAND AND SON HAVE DIED W/IN PAST YEAR 2013  . Thyroid disease   . SVT (supraventricular tachycardia) 09/2014    Past Surgical History  Procedure Laterality Date  . Bunionectomy      left and right with hammer toe repair  . Appendectomy    . Oophorectomy    . Rotator cuff repair  1/13,  3/13    x 3  right/  . Foot surgery    . Salpingectomy    . Bladder surgery    . Nasal sinus surgery    . Knee arthroscopy  02/09/2011    Procedure: ARTHROSCOPY KNEE;  Surgeon: Jacki Conesonald A Gioffre;  Location: [facility];  Service: Orthopedics;  Laterality: Left;  Left Knee Arthroscopy with Menisectomy medial, abrasion chondroplasty medial, and synovectomy, supra patella pouch.  . Back surgery      lumbar  . Eye surgery      bilateral cataract extraction with IOL  . Total knee arthroplasty  09/05/2011    Procedure: TOTAL KNEE ARTHROPLASTY;  Surgeon: Jacki Conesonald A Gioffre, MD;  Location: [facility];   Service: Orthopedics;  Laterality: Left;  . Shoulder open rotator cuff repair Left 06/26/2012    Procedure: LEFT SHOULDER ROTATOR CUFF REPAIR WITH ANCHORS ;  Surgeon: Jacki Conesonald A Gioffre, MD;  Location: [facility];  Service: Orthopedics;  Laterality: Left;    Family History  Problem Relation Age of Onset  . Diabetes Mother   . Diabetes Son     History  Substance Use Topics  . Smoking status: Former Smoker -- 0.00 packs/day for 0 years    Types: Cigarettes    Quit date: 02/04/1989  . Smokeless tobacco: Never Used  . Alcohol Use: No     Comment: socially     Review of Systems  Respiratory: Positive for shortness of breath.   Cardiovascular: Positive for palpitations. Negative for chest pain.  All other systems reviewed and are negative.   Home Medications   Prior to Admission medications   Medication Sig Start Date End Date Taking? Authorizing Provider  aspirin EC 81 MG tablet Take 81 mg by mouth daily.   Yes Historical Provider, MD  atorvastatin (LIPITOR) 40 MG tablet Take 40 mg by mouth every evening.    Yes Historical Provider, MD  beta carotene w/minerals (OCUVITE) tablet Take 1 tablet by mouth daily with breakfast.    Yes Historical Provider, MD  calcium carbonate (OS-CAL) 600 MG TABS Take 1,800 mg by mouth daily with breakfast.    Yes Historical Provider, MD  cholecalciferol (VITAMIN D) 1000 UNITS tablet Take 1,000 Units by mouth 2 (two) times daily.    Yes Historical Provider, MD  esomeprazole (NEXIUM) 20 MG capsule Take 20 mg by mouth every morning.    Yes Historical Provider, MD  flecainide (TAMBOCOR) 100 MG tablet Take 1 tablet (100 mg total) by mouth 2 (two) times daily. 05/28/14  Yes Lyn Records, MD  fluticasone [facility]) 50 MCG/ACT nasal spray Place 1 spray into both nostrils once as needed for allergies.  07/24/13  Yes Historical Provider, MD  meclizine (ANTIVERT) 25 MG tablet Take 25 mg by mouth as needed for dizziness.  07/09/13  Yes Historical Provider, MD  polyethylene  glycol (MIRALAX / GLYCOLAX) packet Take 17 g by mouth every other day.    Yes Historical Provider, MD  warfarin (COUMADIN) 2.5 MG tablet Take 2.5-5 mg by mouth every morning. Take 2 tablets on Monday, Wednesday and Friday then take 1 tablet all the other days   Yes Historical Provider, MD  metoprolol succinate (TOPROL-XL) 25 MG 24 hr tablet Take 1 tablet (25 mg total) by mouth daily. 09/29/14   Brittainy Sherlynn Carbon, PA-C    Allergies  Review of patient's allergies indicates no known allergies.  Triage Vitals: BP 104/76 mmHg  Pulse 161  Temp(Src) 97.5 F (36.4 C) (Oral)  Resp 16  SpO2 96%  Physical Exam  Constitutional: She is oriented to person, place, and time. She appears well-developed and well-nourished.  HENT:  Head: Normocephalic and atraumatic.  Right Ear: External ear normal.  Left Ear: External ear normal.  Eyes: Conjunctivae and EOM are normal. Pupils are equal, round, and reactive to light.  Neck: Normal range of motion. Neck supple.  Cardiovascular: Regular rhythm, normal heart sounds and intact distal pulses.  Tachycardia present.   Pulmonary/Chest: Effort normal and breath sounds normal.  Abdominal: Soft. Bowel sounds are normal. There is no tenderness.  Musculoskeletal: Normal range of motion.  Neurological: She is alert and oriented to person, place, and time.  Skin: Skin is warm and dry.  Vitals reviewed.   ED Course  CARDIOVERSION Date/Time: 09/29/2014 11:06 PM Performed by: Mirian Mo Authorized by: Mirian Mo Consent: Verbal consent obtained. Patient sedated: no Cardioversion basis: emergent Pre-procedure rhythm: supraventricular tachycardia Patient position: patient was placed in a supine position Chest area: chest area exposed Post-procedure rhythm: normal sinus rhythm Complications: no complications Comments: Chemical cardioversion with adenosine 6 mg     DIAGNOSTIC STUDIES: Oxygen Saturation is 96% on RA, normal by my interpretation.     COORDINATION OF CARE: 11:24 PM Discussed treatment plan which includes lab work, EKG, CXR, and adenosine with pt at bedside and pt agreed to plan.  12:37 AM I re-evaluated the patient and provided an update on the treatment plan including giving another dose of adenosine given that her heart rate is back in the 140s.  1:19 AM-Consult complete with Cardiology. Patient case explained and discussed. Call ended at 1:25 AM.   2:12 AM I re-evaluated the patient and updated her on the plan for admission.    Labs Review-  Labs Reviewed  BASIC METABOLIC PANEL - Abnormal; Notable for the following:    Glucose, Bld 121 (*)    All other components within normal limits  PROTIME-INR - Abnormal; Notable for the following:    Prothrombin Time 20.1 (*)    INR 1.71 (*)  All other components within normal limits  BASIC METABOLIC PANEL - Abnormal; Notable for the following:    Glucose, Bld 104 (*)    Calcium 8.4 (*)    All other components within normal limits  PROTIME-INR - Abnormal; Notable for the following:    Prothrombin Time 22.8 (*)    INR 2.03 (*)    All other components within normal limits  CBC  TSH  CBC  HEPARIN LEVEL (UNFRACTIONATED)  CBC  PROTIME-INR  HEPARIN LEVEL (UNFRACTIONATED)  I-STAT TROPOININ, ED    Imaging Review Dg Chest Portable 1 View  09/29/2014   CLINICAL DATA:  81 year old female with atrial fibrillation and supraventricular tachycardia.  EXAM: PORTABLE CHEST - 1 VIEW  COMPARISON:  Radiograph dated 06/29/2013  FINDINGS: Single-view of the chest demonstrate emphysematous changes of the lungs. There is no focal consolidation, pleural effusion, or pneumothorax. Stable cardiac silhouette. There is thoracic levoscoliosis.  IMPRESSION: Emphysema.  No focal consolidation or pneumothorax.   Electronically Signed   By: Elgie Collard M.D.   On: 09/29/2014 00:36    EKG Interpretation  Date/Time:  Tuesday September 28 2014 23:27:46 EDT Ventricular Rate:  99 PR  Interval:  140 QRS Duration: 87 QT Interval:  322 QTC Calculation: 413 R Axis:   1 Text Interpretation:  Sinus rhythm svt now resolved after adenosine Confirmed by Mirian Mo [phone]) on 09/28/2014 11:49:06 PM       MDM   Final diagnoses:  None     81 y.o. female with pertinent PMH of afib, question prior SVT ("they gave me a medicine that made my heart stop") presents with recurrent palpitations.  She has a very mild amount of dyspnea, but no chest pain, other symptoms.  Physical exam as above.  EKG with likely SVT. Attempted vagal maneuvers, unsuccessful despite good effot. Given adenosine with conversion to NSR, normal rate.  Then had recurrence after 1 hour.  Given adenosine again with conversion.  Called cardiology for admission.  .    I have reviewed all laboratory and imaging studies if ordered as above  1. SVT    Mirian Mo, MD 09/29/14 2311

## 2014-09-28 NOTE — Telephone Encounter (Signed)
New MEssage  Pt called c/o of increased heart rate yesterday 7/18 around 4pm. Pt stated she is in and out of AF. Please call back and discuss.

## 2014-09-29 ENCOUNTER — Observation Stay (HOSPITAL_BASED_OUTPATIENT_CLINIC_OR_DEPARTMENT_OTHER): Payer: Medicare Other

## 2014-09-29 ENCOUNTER — Telehealth: Payer: Self-pay | Admitting: Interventional Cardiology

## 2014-09-29 ENCOUNTER — Encounter (HOSPITAL_COMMUNITY): Payer: Self-pay

## 2014-09-29 DIAGNOSIS — Z7901 Long term (current) use of anticoagulants: Secondary | ICD-10-CM | POA: Diagnosis not present

## 2014-09-29 DIAGNOSIS — M1990 Unspecified osteoarthritis, unspecified site: Secondary | ICD-10-CM | POA: Diagnosis present

## 2014-09-29 DIAGNOSIS — I10 Essential (primary) hypertension: Secondary | ICD-10-CM | POA: Diagnosis present

## 2014-09-29 DIAGNOSIS — R55 Syncope and collapse: Secondary | ICD-10-CM

## 2014-09-29 DIAGNOSIS — R002 Palpitations: Secondary | ICD-10-CM | POA: Diagnosis present

## 2014-09-29 DIAGNOSIS — I5032 Chronic diastolic (congestive) heart failure: Secondary | ICD-10-CM

## 2014-09-29 DIAGNOSIS — E785 Hyperlipidemia, unspecified: Secondary | ICD-10-CM | POA: Diagnosis present

## 2014-09-29 DIAGNOSIS — I4891 Unspecified atrial fibrillation: Secondary | ICD-10-CM | POA: Diagnosis not present

## 2014-09-29 DIAGNOSIS — I471 Supraventricular tachycardia: Principal | ICD-10-CM

## 2014-09-29 DIAGNOSIS — R000 Tachycardia, unspecified: Secondary | ICD-10-CM

## 2014-09-29 DIAGNOSIS — I480 Paroxysmal atrial fibrillation: Secondary | ICD-10-CM

## 2014-09-29 DIAGNOSIS — Z87891 Personal history of nicotine dependence: Secondary | ICD-10-CM | POA: Diagnosis not present

## 2014-09-29 DIAGNOSIS — Z7982 Long term (current) use of aspirin: Secondary | ICD-10-CM | POA: Diagnosis not present

## 2014-09-29 LAB — BASIC METABOLIC PANEL
ANION GAP: 7 (ref 5–15)
BUN: 20 mg/dL (ref 6–20)
CALCIUM: 8.4 mg/dL — AB (ref 8.9–10.3)
CHLORIDE: 109 mmol/L (ref 101–111)
CO2: 26 mmol/L (ref 22–32)
Creatinine, Ser: 0.64 mg/dL (ref 0.44–1.00)
GFR calc Af Amer: >60 mL/min (ref >60)
GLUCOSE: 104 mg/dL — AB (ref 65–99)
POTASSIUM: 4.8 mmol/L (ref 3.5–5.1)
Sodium: 142 mmol/L (ref 135–145)

## 2014-09-29 LAB — CBC
HCT: 37.7 % (ref 36.0–46.0)
HEMOGLOBIN: 12.2 g/dL (ref 12.0–15.0)
MCH: 30.8 pg (ref 26.0–34.0)
MCHC: 32.4 g/dL (ref 30.0–36.0)
MCV: 95.2 fL (ref 78.0–100.0)
PLATELETS: 220 10*3/uL (ref 150–400)
RBC: 3.96 MIL/uL (ref 3.87–5.11)
RDW: 14.8 % (ref 11.5–15.5)
WBC: 6 10*3/uL (ref 4.0–10.5)

## 2014-09-29 LAB — HEPARIN LEVEL (UNFRACTIONATED): HEPARIN UNFRACTIONATED: 0.37 [IU]/mL (ref 0.30–0.70)

## 2014-09-29 LAB — PROTIME-INR
INR: 1.71 — ABNORMAL HIGH (ref 0.00–1.49)
INR: 2.03 — ABNORMAL HIGH (ref 0.00–1.49)
Prothrombin Time: 20.1 seconds — ABNORMAL HIGH (ref 11.6–15.2)
Prothrombin Time: 22.8 seconds — ABNORMAL HIGH (ref 11.6–15.2)

## 2014-09-29 LAB — TSH: TSH: 3.955 u[IU]/mL (ref 0.350–4.500)

## 2014-09-29 MED ORDER — ATORVASTATIN CALCIUM 40 MG PO TABS
40.0000 mg | ORAL_TABLET | Freq: Every evening | ORAL | Status: DC
  Filled 2014-09-29: qty 1

## 2014-09-29 MED ORDER — ACETAMINOPHEN 325 MG PO TABS: 650.0000 mg | ORAL_TABLET | Freq: Four times a day (QID) | ORAL | Status: DC | PRN

## 2014-09-29 MED ORDER — FLECAINIDE ACETATE 100 MG PO TABS
100.0000 mg | ORAL_TABLET | Freq: Two times a day (BID) | ORAL | Status: DC
  Administered 2014-09-29: 100 mg via ORAL
  Filled 2014-09-29 (×2): qty 1

## 2014-09-29 MED ORDER — SODIUM CHLORIDE 0.9 % IV BOLUS (SEPSIS)
1000.0000 mL | Freq: Once | INTRAVENOUS | Status: AC
  Administered 2014-09-29: 1000 mL via INTRAVENOUS

## 2014-09-29 MED ORDER — METOPROLOL TARTRATE 25 MG PO TABS
12.5000 mg | ORAL_TABLET | Freq: Once | ORAL | Status: AC
  Administered 2014-09-29: 12.5 mg via ORAL
  Filled 2014-09-29: qty 1

## 2014-09-29 MED ORDER — ADENOSINE 6 MG/2ML IV SOLN
6.0000 mg | Freq: Once | INTRAVENOUS | Status: AC
  Administered 2014-09-29: 6 mg via INTRAVENOUS

## 2014-09-29 MED ORDER — METOPROLOL SUCCINATE ER 25 MG PO TB24: 25.0000 mg | ORAL_TABLET | Freq: Every day | ORAL | Status: DC

## 2014-09-29 MED ORDER — ACETAMINOPHEN 650 MG RE SUPP
650.0000 mg | Freq: Four times a day (QID) | RECTAL | Status: DC | PRN
Start: 2014-09-29 — End: 2014-09-29

## 2014-09-29 MED ORDER — METOPROLOL SUCCINATE ER 25 MG PO TB24
25.0000 mg | ORAL_TABLET | Freq: Every day | ORAL | Status: DC
  Administered 2014-09-29: 25 mg via ORAL
  Filled 2014-09-29: qty 1

## 2014-09-29 MED ORDER — METOPROLOL TARTRATE 1 MG/ML IV SOLN
5.0000 mg | Freq: Once | INTRAVENOUS | Status: DC
Start: 2014-09-29 — End: 2014-09-29

## 2014-09-29 MED ORDER — HEPARIN (PORCINE) IN NACL 100-0.45 UNIT/ML-% IJ SOLN
1000.0000 [IU]/h | INTRAMUSCULAR | Status: DC
  Administered 2014-09-29: 1000 [IU]/h via INTRAVENOUS
  Filled 2014-09-29 (×2): qty 250

## 2014-09-29 MED ORDER — PANTOPRAZOLE SODIUM 40 MG PO TBEC
40.0000 mg | DELAYED_RELEASE_TABLET | Freq: Every day | ORAL | Status: DC
  Administered 2014-09-29: 40 mg via ORAL
  Filled 2014-09-29: qty 1

## 2014-09-29 MED ORDER — OXYCODONE HCL 5 MG PO TABS: 5.0000 mg | ORAL_TABLET | ORAL | Status: DC | PRN

## 2014-09-29 MED ORDER — FLUTICASONE PROPIONATE 50 MCG/ACT NA SUSP
1.0000 | Freq: Every day | NASAL | Status: DC | PRN
  Filled 2014-09-29: qty 16

## 2014-09-29 MED ORDER — ASPIRIN EC 81 MG PO TBEC
81.0000 mg | DELAYED_RELEASE_TABLET | Freq: Every day | ORAL | Status: DC
  Administered 2014-09-29: 81 mg via ORAL
  Filled 2014-09-29: qty 1

## 2014-09-29 NOTE — Progress Notes (Signed)
Echocardiogram 2D Echocardiogram has been performed.  Ann Rodriguez, Ann Rodriguez M 09/29/2014, 3:30 PM

## 2014-09-29 NOTE — Progress Notes (Signed)
Pt transpoerted to unit per stretcher. VSS. Pt oriented to unit. Call bell within place. Will continue to monitor.  Sandrea HammondJunris Dalonda Simoni RN

## 2014-09-29 NOTE — Progress Notes (Signed)
ANTICOAGULATION CONSULT NOTE - Initial Consult  Pharmacy Consult for heparin Indication: atrial fibrillation  No Known Allergies  Patient Measurements: Height: 5\' 6"  (167.6 cm) Weight: 152 lb 4.8 oz (69.083 kg) IBW/kg (Calculated) : 59.3  Vital Signs: Temp: 97.5 F (36.4 C) (07/19 2310) Temp Source: Oral (07/19 2310) BP: 128/79 mmHg (07/20 0300) Pulse Rate: 64 (07/20 0300)  Labs:  Recent Labs  09/28/14 1932 09/28/14 2310  HGB  --  13.9  HCT  --  42.3  PLT  --  292  LABPROT 20.1*  --   INR 1.71*  --   CREATININE  --  0.78    Estimated Creatinine Clearance: 49.9 mL/min (by C-G formula based on Cr of 0.78).   Medical History: Past Medical History  Diagnosis Date  . Stress fracture of right foot 12/06/2010    NO PROBLEM NOW  . Anxiety   . GERD (gastroesophageal reflux disease)   . Arthritis   . Hypertension     per Dr Katrinka BlazingSmith OV note,/ ESSENTIAL  . Warfarin anticoagulation   . Pain     LEFT KNEE - SINCE A FALL ABOUT May 15, 2012 -- STATES XRAYS SHOWED LEFT KNEE REPLACEMENT WAS OK - BUT PT STILL HAVING PAIN AND SWELLING  . Pain     LEFT SHOULDER PAIN - SINCE FALL May 15, 2012 - TORE ROTATOR CUFF  . Arrhythmia     ATRIAL FIB- RHYTHM CONTROL WITH FLECAINIDE.  CHRONIC WARFARIN  . A-fib   . Hyperlipidemia   . Depression     PT'S HUSBAND AND SON HAVE DIED W/IN PAST YEAR 2013  . Stroke 1/13    PER CT- age related atrophy with mild small vessel ischemic change- no defecits AND PT STATES SHE WAS NEVER AWARE OF HAVING HAD STROKE  . Thyroid disease     Medications:  Prescriptions prior to admission  Medication Sig Dispense Refill Last Dose  . aspirin EC 81 MG tablet Take 81 mg by mouth daily.   09/28/2014 at Unknown time  . atorvastatin (LIPITOR) 40 MG tablet Take 40 mg by mouth every evening.    09/28/2014 at Unknown time  . beta carotene w/minerals (OCUVITE) tablet Take 1 tablet by mouth daily with breakfast.    09/28/2014 at Unknown time  . calcium carbonate  (OS-CAL) 600 MG TABS Take 1,800 mg by mouth daily with breakfast.    09/28/2014 at Unknown time  . cholecalciferol (VITAMIN D) 1000 UNITS tablet Take 1,000 Units by mouth 2 (two) times daily.    09/28/2014 at Unknown time  . esomeprazole (NEXIUM) 20 MG capsule Take 20 mg by mouth every morning.    09/28/2014 at Unknown time  . flecainide (TAMBOCOR) 100 MG tablet Take 1 tablet (100 mg total) by mouth 2 (two) times daily. 180 tablet 1 09/28/2014 at Unknown time  . fluticasone (FLONASE) 50 MCG/ACT nasal spray Place 1 spray into both nostrils once as needed for allergies.    Past Month at Unknown time  . meclizine (ANTIVERT) 25 MG tablet Take 25 mg by mouth as needed for dizziness.    Past Month at Unknown time  . polyethylene glycol (MIRALAX / GLYCOLAX) packet Take 17 g by mouth every other day.    Past Week at Unknown time  . warfarin (COUMADIN) 2.5 MG tablet Take 2.5-5 mg by mouth every morning. Take 2 tablets on Monday, Wednesday and Friday then take 1 tablet all the other days   09/28/2014 at Unknown time   Scheduled:  .  aspirin EC  81 mg Oral Daily  . atorvastatin  40 mg Oral QPM  . flecainide  100 mg Oral BID  . metoprolol succinate  25 mg Oral Daily  . pantoprazole  40 mg Oral Daily    Assessment: 81yo female called cards for HR to 140s and irregular rhythm, pt has known h/o Afib episodes lasting ~1hr then back to NSR but hasn't improved at home until she felt herself convert just before presenting to ED, concern for more breakthrough Afib on flecainide than pt is aware but pt currently refusing monitor, current INR below goal, to begin heparin.  Goal of Therapy:  Heparin level 0.3-0.7 units/ml Monitor platelets by anticoagulation protocol: Yes   Plan:  Will begin heparin gtt at 1000 units/hr and monitor heparin levels and CBC.  Vernard Gambles, PharmD, BCPS  09/29/2014,3:38 AM

## 2014-09-29 NOTE — H&P (Signed)
CARDIOLOGY INPATIENT HISTORY AND PHYSICAL EXAMINATION NOTE  Patient ID: Ann Rodriguez MRN: [id number], DOB/AGE: Jan 08, 1929   Admit date: 09/28/2014   Primary Physician: Sissy Hoff, MD Primary Cardiologist: Lyn Records MD  Reason for admission: SVT  HPI: This is a 81 y.o. white or Caucasian  female with prior history of atrial fibrillation (rhythm controlled with flecainide), hypertension, chronic diastolic heart failure and OA who presented with palpitations to the ED.  Per patient she has been doing fine until 8:30 pm today when she started experiencing rapid palpitations. She saw PA Theodore Demark today for palpitations when she was complaining of 1-2 episodes since she has been on flecainide. She also feels dizzy with these palpitations and is complaining of DOE and fatigue. These palpitations usually stay for few minutes to an hour and then improve on her own. She had similar symptoms yestyerday as well for which she saw PA today. She had been having these symptoms since last 8-9 years. She has not tried valsalva maneuvers in the past. She was talked to about ablation in the past although she wanted to think about it. In the clinic, her HR was 140 bpm today.  She presented to the ED with HR of 161. She was given 6 mg of adenosine first and then another 6 mg of adenosine with which she converted from SVT to NSR. The rhythm appears to be AVNRT rather than AVRT or AT. She was also given metoprolol 12.5 mg PO along with a bolus of 1L NS.   EKG showed regular narrow complex tachycardia with vent rate of 143 bpm, with short RP with 1:1 conduction pattern, likely to be AVNRT Echo not available recently   Problem List: Past Medical History  Diagnosis Date  . Stress fracture of right foot 12/06/2010    NO PROBLEM NOW  . Anxiety   . GERD (gastroesophageal reflux disease)   . Arthritis   . Hypertension     per Dr Katrinka Blazing OV note,/ ESSENTIAL  . Warfarin anticoagulation   . Pain     LEFT  KNEE - SINCE A FALL ABOUT May 15, 2012 -- STATES XRAYS SHOWED LEFT KNEE REPLACEMENT WAS OK - BUT PT STILL HAVING PAIN AND SWELLING  . Pain     LEFT SHOULDER PAIN - SINCE FALL May 15, 2012 - TORE ROTATOR CUFF  . Arrhythmia     ATRIAL FIB- RHYTHM CONTROL WITH FLECAINIDE.  CHRONIC WARFARIN  . A-fib   . Hyperlipidemia   . Depression     PT'S HUSBAND AND SON HAVE DIED W/IN PAST YEAR 2011/08/04  . Stroke 1/13    PER CT- age related atrophy with mild small vessel ischemic change- no defecits AND PT STATES SHE WAS NEVER AWARE OF HAVING HAD STROKE  . Thyroid disease     Past Surgical History  Procedure Laterality Date  . Bunionectomy      left and right with hammer toe repair  . Appendectomy    . Oophorectomy    . Rotator cuff repair  1/13,  3/13    x 3  right/  . Foot surgery    . Salpingectomy    . Bladder surgery    . Nasal sinus surgery    . Knee arthroscopy  02/09/2011    Procedure: ARTHROSCOPY KNEE;  Surgeon: Jacki Cones;  Location: [facility];  Service: Orthopedics;  Laterality: Left;  Left Knee Arthroscopy with Menisectomy medial, abrasion chondroplasty medial, and synovectomy, supra patella pouch.  . Back surgery  lumbar  . Eye surgery      bilateral cataract extraction with IOL  . Total knee arthroplasty  09/05/2011    Procedure: TOTAL KNEE ARTHROPLASTY;  Surgeon: Jacki Cones, MD;  Location: [facility];  Service: Orthopedics;  Laterality: Left;  . Shoulder open rotator cuff repair Left 06/26/2012    Procedure: LEFT SHOULDER ROTATOR CUFF REPAIR WITH ANCHORS ;  Surgeon: Jacki Cones, MD;  Location: [facility];  Service: Orthopedics;  Laterality: Left;     Allergies: No Known Allergies   Home Medications No current facility-administered medications for this encounter.   Current Outpatient Prescriptions  Medication Sig Dispense Refill  . aspirin EC 81 MG tablet Take 81 mg by mouth daily.    Marland Kitchen atorvastatin (LIPITOR) 40 MG tablet Take 40 mg by mouth every evening.     . beta  carotene w/minerals (OCUVITE) tablet Take 1 tablet by mouth daily with breakfast.     . calcium carbonate (OS-CAL) 600 MG TABS Take 1,800 mg by mouth daily with breakfast.     . cholecalciferol (VITAMIN D) 1000 UNITS tablet Take 1,000 Units by mouth 2 (two) times daily.     Marland Kitchen esomeprazole (NEXIUM) 20 MG capsule Take 20 mg by mouth every morning.     . flecainide (TAMBOCOR) 100 MG tablet Take 1 tablet (100 mg total) by mouth 2 (two) times daily. 180 tablet 1  . fluticasone (FLONASE) 50 MCG/ACT nasal spray Place 1 spray into both nostrils once as needed for allergies.     Marland Kitchen meclizine (ANTIVERT) 25 MG tablet Take 25 mg by mouth as needed for dizziness.     . polyethylene glycol (MIRALAX / GLYCOLAX) packet Take 17 g by mouth every other day.     . warfarin (COUMADIN) 2.5 MG tablet Take 2.5-5 mg by mouth every morning. Take 2 tablets on Monday, Wednesday and Friday then take 1 tablet all the other days       Family History  Problem Relation Age of Onset  . Diabetes Mother   . Diabetes Son      History   Social History  . Marital Status: Widowed    Spouse Name: N/A  . Number of Children: N/A  . Years of Education: N/A   Occupational History  . Not on file.   Social History Main Topics  . Smoking status: Former Smoker -- 0.00 packs/day for 0 years    Types: Cigarettes    Quit date: 02/04/1989  . Smokeless tobacco: Not on file  . Alcohol Use: No     Comment: socially  . Drug Use: No  . Sexual Activity: Not on file   Other Topics Concern  . Not on file   Social History Narrative     Review of Systems: General: negative for chills, fever, night sweats or weight changes.  Cardiovascular: dyspnea and palpitations negative for chest pain, edema, orthopnea, palpitations, paroxysmal nocturnal dyspnea  Dermatological: negative for rash Respiratory: negative for cough or wheezing Urologic: negative for hematuria Abdominal: negative for nausea, vomiting, diarrhea, bright red blood  per rectum, melena, or hematemesis Neurologic: negative for visual changes, syncope, or dizziness  Physical Exam: Vitals: BP 113/71 mmHg  Pulse 67  Temp(Src) 97.5 F (36.4 C) (Oral)  Resp 19  SpO2 98% General: not in acute distress Neck: JVP flat, neck supple Heart: regular rate and rhythm, S1, S2, no murmurs  Lungs: CTAB  GI: non tender, non distended, bowel sounds present Extremities: no edema Neuro: AAO x 3  Psych: normal affect, no anxiety   Labs:   Results for orders placed or performed during the hospital encounter of 09/28/14 (from the past 24 hour(s))  Protime-INR     Status: Abnormal   Collection Time: 09/28/14  7:32 PM  Result Value Ref Range   Prothrombin Time 20.1 (H) 11.6 - 15.2 seconds   INR 1.71 (H) 0.00 - 1.49  Basic metabolic panel     Status: Abnormal   Collection Time: 09/28/14 11:10 PM  Result Value Ref Range   Sodium 139 135 - 145 mmol/L   Potassium 4.1 3.5 - 5.1 mmol/L   Chloride 106 101 - 111 mmol/L   CO2 27 22 - 32 mmol/L   Glucose, Bld 121 (H) 65 - 99 mg/dL   BUN 18 6 - 20 mg/dL   Creatinine, Ser 7.820.78 0.44 - 1.00 mg/dL   Calcium 9.1 8.9 - 95.610.3 mg/dL   GFR calc non Af Amer >60 >60 mL/min   GFR calc Af Amer >60 >60 mL/min   Anion gap 6 5 - 15  CBC     Status: None   Collection Time: 09/28/14 11:10 PM  Result Value Ref Range   WBC 6.2 4.0 - 10.5 K/uL   RBC 4.54 3.87 - 5.11 MIL/uL   Hemoglobin 13.9 12.0 - 15.0 g/dL   HCT 21.342.3 08.636.0 - 57.846.0 %   MCV 93.2 78.0 - 100.0 fL   MCH 30.6 26.0 - 34.0 pg   MCHC 32.9 30.0 - 36.0 g/dL   RDW 46.914.7 62.911.5 - 52.815.5 %   Platelets 292 150 - 400 K/uL  I-stat troponin, ED     Status: None   Collection Time: 09/28/14 11:39 PM  Result Value Ref Range   Troponin i, poc 0.06 0.00 - 0.08 ng/mL   Comment 3             Radiology/Studies: Dg Chest Portable 1 View  09/29/2014   CLINICAL DATA:  81 year old female with atrial fibrillation and supraventricular tachycardia.  EXAM: PORTABLE CHEST - 1 VIEW  COMPARISON:   Radiograph dated 06/29/2013  FINDINGS: Single-view of the chest demonstrate emphysematous changes of the lungs. There is no focal consolidation, pleural effusion, or pneumothorax. Stable cardiac silhouette. There is thoracic levoscoliosis.  IMPRESSION: Emphysema.  No focal consolidation or pneumothorax.   Electronically Signed   By: Elgie CollardArash  Radparvar M.D.   On: 09/29/2014 00:36    EKG: showed regular narrow complex tachycardia with vent rate of 143 bpm, with short RP with 1:1 conduction pattern, likely to be AVNRT   Echo: not available  Cardiac cath: not performed  Medical decision making:  Discussed care with the patient Discussed care with the ED physician on the phone Reviewed labs and imaging personally Reviewed prior records  ASSESSMENT AND PLAN:  This is a 81 y.o. female with prior history of atrial fibrillation (rhythm controlled with flecainide), hypertension, chronic diastolic heart failure and OA who presented with palpitations to the ED.    Active Problems:   * No active hospital problems. *  Supraventricular tachycardia, symptomatic Responded to adenosine x 2 Started on low dose metoprolol Will continue with flecainide Could increase the dose of flecainide vs. Consider for ablation Discussed the procedure of fast pathway ablation Started on heparin and stopped warfarin in the anticipation of ablation  Paroxysmal atrial fibrillation with RVR, rhythm controlled Non valvular atrial fibrillation Prior history of dCHF, hypertension, age 34>75, and vascular disease (CHADS2VASC = 5, CHADS2 = 3) Maintained on flecainide w/o beta  blockade, LA size is unknown, echo not available Continue flecainide on home dose + metoprolol started at 12.5 mg BID Consider TEE/DCCV or just DCCV as patient was on anticoagulation for sometime Checking TSH, electrolytes Keep potassium >4, calcium >9, magnesium >2 Repeat echocardiogram to evaluate for LVEF/tachycardia induced cardiomyoapthy  Chronic  diastolic heart failure Checking BNP Currently appears to be compensated Control rate/rhythm  Signed, Joellyn Rued, MD MS 09/29/2014, 1:50 AM

## 2014-09-29 NOTE — Progress Notes (Signed)
Called for report. Nurse at lunch. Left number for call back.  Sandrea HammondJunris Shalev Helminiak RN

## 2014-09-29 NOTE — ED Notes (Signed)
Cardiology at [facility]

## 2014-09-29 NOTE — Discharge Summary (Signed)
Physician Discharge Summary  Patient ID: Ann Rodriguez MRN: [id number] DOB/AGE: 07-25-1928 81 y.o.   Primary Cardiologist: Dr. Katrinka Blazing  Admit date: 09/28/2014 Discharge date: 09/29/2014  Admission Diagnoses: SVT  Discharge Diagnoses:  Active Problems:   SVT (supraventricular tachycardia)   Discharged Condition: stable  Hospital Course: Ann Rodriguez is a 81 y.o. female with a past medical history significant for paroxysmal atrial fibrillation (on Warfarin and Flecainide), hyperlipidemia, and SVT. She reports increasing frequency and duration of palpitations requiring adenosine to terminate on 2 different occasions. She had a prolonged period of tachycardia 09/28/14  and presented to the ER for evaluation where  of adenosine terminated her tachycardia (strips are not available for review). She was admitted overnight and had no further arrhythmia. Electrolytes were normal as well as TSH. 2D echo showed normal LVF with EF of 55-60% and no WMA. EP was consulted and she was seen by Dr. Graciela Husbands. Her recommended either continuation of AAD therapy vs RFCA. The patient preferred to remain on medical therapy. She was continued on Flecainide and metoprolol was added. Coumadin was continued. Dr. Graciela Husbands felt that she was stable from a cardiac standpoint. F/u has been arranged in EP clinic  for 10/27/14.   Consults: Electrophysiology  Treatments: See Hospital Course  Discharge Exam: Blood pressure 111/61, pulse 53, temperature 97.9 F (36.6 C), temperature source Oral, resp. rate 17, height  (1.676 m), weight 152 lb 8 oz (69.174 kg), SpO2 99 %.   Disposition: 01-Home or Self Care      Discharge Instructions    Diet - low sodium heart healthy    Complete by:  As directed      Increase activity slowly    Complete by:  As directed             Medication List    TAKE these medications        aspirin EC 81 MG tablet  Take 81 mg by mouth daily.     atorvastatin 40 MG tablet    Commonly known as:  LIPITOR  Take 40 mg by mouth every evening.     beta carotene w/minerals tablet  Take 1 tablet by mouth daily with breakfast.     calcium carbonate 600 MG Tabs tablet  Commonly known as:  OS-CAL  Take 1,800 mg by mouth daily with breakfast.     cholecalciferol 1000 UNITS tablet  Commonly known as:  VITAMIN D  Take 1,000 Units by mouth 2 (two) times daily.     esomeprazole 20 MG capsule  Commonly known as:  NEXIUM  Take 20 mg by mouth every morning.     flecainide 100 MG tablet  Commonly known as:  TAMBOCOR  Take 1 tablet (100 mg total) by mouth 2 (two) times daily.     fluticasone 50 MCG/ACT nasal spray  Commonly known as:  FLONASE  Place 1 spray into both nostrils once as needed for allergies.     meclizine 25 MG tablet  Commonly known as:  ANTIVERT  Take 25 mg by mouth as needed for dizziness.     metoprolol succinate 25 MG 24 hr tablet  Commonly known as:  TOPROL-XL  Take 1 tablet (25 mg total) by mouth daily.     polyethylene glycol packet  Commonly known as:  MIRALAX / GLYCOLAX  Take 17 g by mouth every other day.     warfarin 2.5 MG tablet  Commonly known as:  COUMADIN  Take 2.5-5 mg by mouth  every morning. Take 2 tablets on Monday, Wednesday and Friday then take 1 tablet all the other days       Follow-up Information    Follow up with Marily LenteSeiler,Amber K, NP On 10/27/2014.   Specialty:  Nurse Practitioner   Why:  at 1:30PM   Contact information:   [address] [place] [address] [phone]      TIME SPENT ON DISCHARGE, INCLUDING PHYSICIAN TIME: >30 MINUTES   Signed: Robbie LisSIMMONS, Jaymen Fetch 09/29/2014, 8:24 PM

## 2014-09-29 NOTE — Telephone Encounter (Signed)
Returned pt call. Adv her that Dr.Smith is currently out of town. [facility]. Adv her she should follow the recommendations of the doctors taking care of her.

## 2014-09-29 NOTE — ED Notes (Signed)
MD at bedside. 

## 2014-09-29 NOTE — Progress Notes (Signed)
   I reviewed the chart & Tele. -[place].  BP stable & no SSx of CHF.  Echo pending  With PAF & SVT - agree with admitting MD recommendation of EP evaluation.  For now continue Flecainide + addition of BB.  Pending EP Eval, would probably restart warfarin& anticipate d/c if no procedures or alternative Rx options considered.  Will consult EP & wait for Echo.  Marykay LexHARDING, DAVID W, MD

## 2014-09-29 NOTE — Consult Note (Signed)
ELECTROPHYSIOLOGY CONSULT NOTE    Patient ID: Ann Rodriguez MRN: [id number], DOB/AGE: 05/22/1928 81 y.o.  Admit date: 09/28/2014 Date of Consult: 09/29/2014  Primary Physician: Sissy Hoff, MD Primary Cardiologist: Katrinka Blazing  Reason for Consultation: tachycardia  HPI:  Ann Rodriguez is a 81 y.o. female with a past medical history significant for paroxysmal atrial fibrillation (on Warfarin and Flecainide - I cannot find strips documenting AF, but these may be in prior [place] records), hyperlipidemia, and SVT.  She reports increasing frequency and duration of palpitations requiring adenosine to terminate on 2 different occasions.  She had a prolonged period of tachycardia yesterday and presented to the ER for evaluation where 12mg  of adenosine terminated her tachycardia (strips are not available for review).  She was admitted overnight and EP has been asked to evaluate for treatment options.   No echo in EPIC, ordered for this admission. Lab work is notable for therapeutic INR, otherwise unremarkable.   She denies chest pain, LE edema, recent fevers, chills, nausea or vomiting. She has occasional SOB with exertion which has been stable.   Past Medical History  Diagnosis Date  . GERD (gastroesophageal reflux disease)   . Arthritis   . A-fib   . Hyperlipidemia   . Depression     PT'S HUSBAND AND SON HAVE DIED W/IN PAST YEAR 08/09/11  . Thyroid disease   . SVT (supraventricular tachycardia) 09/2014     Surgical History:  Past Surgical History  Procedure Laterality Date  . Bunionectomy      left and right with hammer toe repair  . Appendectomy    . Oophorectomy    . Rotator cuff repair  1/13,  3/13    x 3  right/  . Foot surgery    . Salpingectomy    . Bladder surgery    . Nasal sinus surgery    . Knee arthroscopy  02/09/2011    Procedure: ARTHROSCOPY KNEE;  Surgeon: Jacki Cones;  Location: [facility];  Service: Orthopedics;  Laterality: Left;  Left Knee Arthroscopy with Menisectomy  medial, abrasion chondroplasty medial, and synovectomy, supra patella pouch.  . Back surgery      lumbar  . Eye surgery      bilateral cataract extraction with IOL  . Total knee arthroplasty  09/05/2011    Procedure: TOTAL KNEE ARTHROPLASTY;  Surgeon: Jacki Cones, MD;  Location: [facility];  Service: Orthopedics;  Laterality: Left;  . Shoulder open rotator cuff repair Left 06/26/2012    Procedure: LEFT SHOULDER ROTATOR CUFF REPAIR WITH ANCHORS ;  Surgeon: Jacki Cones, MD;  Location: [facility];  Service: Orthopedics;  Laterality: Left;     Prescriptions prior to admission  Medication Sig Dispense Refill Last Dose  . aspirin EC 81 MG tablet Take 81 mg by mouth daily.   09/28/2014 at Unknown time  . atorvastatin (LIPITOR) 40 MG tablet Take 40 mg by mouth every evening.    09/28/2014 at Unknown time  . beta carotene w/minerals (OCUVITE) tablet Take 1 tablet by mouth daily with breakfast.    09/28/2014 at Unknown time  . calcium carbonate (OS-CAL) 600 MG TABS Take 1,800 mg by mouth daily with breakfast.    09/28/2014 at Unknown time  . cholecalciferol (VITAMIN D) 1000 UNITS tablet Take 1,000 Units by mouth 2 (two) times daily.    09/28/2014 at Unknown time  . esomeprazole (NEXIUM) 20 MG capsule Take 20 mg by mouth every morning.    09/28/2014 at Unknown time  .  flecainide (TAMBOCOR) 100 MG tablet Take 1 tablet (100 mg total) by mouth 2 (two) times daily. 180 tablet 1 09/28/2014 at Unknown time  . fluticasone (FLONASE) 50 MCG/ACT nasal spray Place 1 spray into both nostrils once as needed for allergies.    Past Month at Unknown time  . meclizine (ANTIVERT) 25 MG tablet Take 25 mg by mouth as needed for dizziness.    Past Month at Unknown time  . polyethylene glycol (MIRALAX / GLYCOLAX) packet Take 17 g by mouth every other day.    Past Week at Unknown time  . warfarin (COUMADIN) 2.5 MG tablet Take 2.5-5 mg by mouth every morning. Take 2 tablets on Monday, Wednesday and Friday then take 1 tablet all the  other days   09/28/2014 at Unknown time    Inpatient Medications:  . aspirin EC  81 mg Oral Daily  . atorvastatin  40 mg Oral QPM  . flecainide  100 mg Oral BID  . metoprolol succinate  25 mg Oral Daily  . pantoprazole  40 mg Oral Daily    Allergies: No Known Allergies  History   Social History  . Marital Status: Widowed    Spouse Name: N/A  . Number of Children: N/A  . Years of Education: N/A   Occupational History  . Not on file.   Social History Main Topics  . Smoking status: Former Smoker -- 0.00 packs/day for 0 years    Types: Cigarettes    Quit date: 02/04/1989  . Smokeless tobacco: Never Used  . Alcohol Use: No     Comment: socially  . Drug Use: No  . Sexual Activity: Not on file   Other Topics Concern  . Not on file   Social History Narrative     Family History  Problem Relation Age of Onset  . Diabetes Mother   . Diabetes Son      Review of Systems: All other systems reviewed and are otherwise negative except as noted above.  Physical Exam: Filed Vitals:   09/29/14 0333 09/29/14 0341 09/29/14 0902 09/29/14 0936  BP:  146/74 120/78 120/78  Pulse:  68 65 65  Temp:  97.5 F (36.4 C) 97.5 F (36.4 C)   TempSrc:  Oral Oral   Resp:  18 18   Height:  (1.676 m)  (1.676 m)    Weight: 152 lb 4.8 oz (69.083 kg) 152 lb 8 oz (69.174 kg)    SpO2:  98% 100%     GEN- The patient is elderly appearing, alert and oriented x 3 today.   HEENT: normocephalic, atraumatic; sclera clear, conjunctiva pink; hearing intact; oropharynx clear; neck supple  Lungs- Clear to ausculation bilaterally, normal work of breathing.  No wheezes, rales, rhonchi Heart- Regular rate and rhythm, no murmurs, rubs or gallops  GI- soft, non-tender, non-distended, bowel sounds present  Extremities- no clubbing, cyanosis, or edema; DP/PT/radial pulses 1+ bilaterally MS- no significant deformity or atrophy Skin- warm and dry, no rash or lesion Psych- euthymic mood, full  affect Neuro- strength and sensation are intact  Labs:   Lab Results  Component Value Date   WBC 6.0 09/29/2014   HGB 12.2 09/29/2014   HCT 37.7 09/29/2014   MCV 95.2 09/29/2014   PLT 220 09/29/2014     Recent Labs Lab 09/29/14 0531  NA 142  K 4.8  CL 109  CO2 26  BUN 20  CREATININE 0.64  CALCIUM 8.4*  GLUCOSE 104*  Radiology/Studies: Dg Chest Portable 1 View 09/29/2014   CLINICAL DATA:  81 year old female with atrial fibrillation and supraventricular tachycardia.  EXAM: PORTABLE CHEST - 1 VIEW  COMPARISON:  Radiograph dated 06/29/2013  FINDINGS: Single-view of the chest demonstrate emphysematous changes of the lungs. There is no focal consolidation, pleural effusion, or pneumothorax. Stable cardiac silhouette. There is thoracic levoscoliosis.  IMPRESSION: Emphysema.  No focal consolidation or pneumothorax.   Electronically Signed   By: Elgie CollardArash  Radparvar M.D.   On: 09/29/2014 00:36    ZOX:WRUEAEKG:short RP tachycardia, rate 166  TELEMETRY: sinus rhythm  Assessment/Plan: 1.  SVT The patient has a short RP tachycardia that by report was terminated with adenosine. These episodes have been increasing in frequency and duration despite Flecainide therapy. Would recommend addition of beta blocker at this time. I have discussed with her the possiblity of ablation.  Her episodes are becoming lifestyle limiting and she would like to consider.  I will arrange outpatient follow up in 4 weeks to discuss further.  2.  Paroxysmal atrial fibrillation She has been maintained on Flecainide and Warfarin for several years.  I do not have any EKG's available of AF, but these may be in [facility] prior records.   CHADS2VASC is at least 3  Dr Graciela HusbandsKlein to see later today. Ok from an EP standpoint to DC home once echo reviewed if normal.  /Echo pending  Signed, Gypsy BalsamAmber Seiler, NP 09/29/2014 11:34 AM   2 issues  Does she really have AFib has her sx do not suggest  and available strips are NOT afib  We  willneed to get these data from [facility]  Treatment strategies incl drugs and or RFCA ; seh would prefer the latter  For theinterim we will contine the adjuctive BB and flecainide and arrange OV in about 3-4 weeks   Can stop heaprin and discharge from my point of view

## 2014-09-29 NOTE — Progress Notes (Signed)
Received report

## 2014-09-29 NOTE — Telephone Encounter (Signed)
New Message   [facility] and she has been admitted since last night and she said she needs to talk   To some one on the decision making that they are planing to preform

## 2014-09-29 NOTE — Progress Notes (Signed)
ANTICOAGULATION CONSULT NOTE - Follow Up Consult  Pharmacy Consult for Heparin Indication: atrial fibrillation  No Known Allergies  Patient Measurements: Height: 5\' 6"  (167.6 cm) Weight: 152 lb 8 oz (69.174 kg) IBW/kg (Calculated) : 59.3 Heparin Dosing Weight: 69 kg  Vital Signs: Temp: 97.5 F (36.4 C) (07/20 0935) Temp Source: Oral (07/20 0935) BP: 120/78 mmHg (07/20 0936) Pulse Rate: 65 (07/20 0936)  Labs:  Recent Labs  09/28/14 1932 09/28/14 2310 09/29/14 0531 09/29/14 1227  HGB  --  13.9 12.2  --   HCT  --  42.3 37.7  --   PLT  --  292 220  --   LABPROT 20.1*  --  22.8*  --   INR 1.71*  --  2.03*  --   HEPARINUNFRC  --   --   --  0.37  CREATININE  --  0.78 0.64  --     Estimated Creatinine Clearance: 49.9 mL/min (by C-G formula based on Cr of 0.64).  Assessment:   Initial heparin level is therapeutic (0.37) on 1000 units/hr.   INR is also now low therapeutic (2.03). Up from 1.71 last night. Home Coumadin held on admission 7/19 pm, but she had already taken her Coumadin dose at home on 7/19.     Home Coumadin regimen: 5 mg MWF, 2.5 mg TTSS.    Goal of Therapy:  Heparin level 0.3-0.7 units/ml Monitor platelets by anticoagulation protocol: Yes   Plan:   Continue heparin drip at 1000 units/hr for now.  Daily heparin level and CBC while on heparin.  Will follow up for resuming Coumadin no procedures planned.  Daily PT/INR ordered.  Dennie FettersEgan, Mckynlee Luse Donovan, RPh Pager: [phone] 09/29/2014,2:01 PM

## 2014-09-30 NOTE — Progress Notes (Signed)
Pt. Discharged to home residence with daughter via wheelchair by Red[place], RN.  Discharge information reviewed and given All personal belongings given to Pt.  Education discussed Bilateral IV was d/c and intact upon removal  Tele d/c and Central monitoring tech notified Teresa Coombs 2:38 AM

## 2014-09-30 NOTE — Telephone Encounter (Signed)
Follow Up  Pt called states that she feels as if she is being treated for something that she doesn't have.  She began the call with requesting a conference to speak with Dr. Michaelle Copas PA Ronie Spies. She says that she came home from the hospital and has questions that she needs answered really quick.   Pt reports that she has decisions that she has to make (pt did not disclose any further details) Please call back to discuss.

## 2014-09-30 NOTE — Telephone Encounter (Signed)
Returned pt call. Pt has several questions regarding her care. She wants to talk with Dr.Smith directly. Adv her that Dr.Smith will be out of the office until 7/27. Pt was seen in the ED, pt was treated for PSVT, Dr.Klein did the consult. Pt sts that she has been under Dr.Smith's for Afib for years. She was told by Dr.Klein that people don't have Afib and PSVT. She wants clarification on her Dx and what she is being treated for. She is upset and feels she is in limbo. She has f/u scheduled with Dr.Smith and EP in August. Adv her I will fwd a message to Dr.Smith and Dr.Klein for clarification in the interim. Pt stated "do whatever" and hung the phone up

## 2014-10-06 NOTE — Telephone Encounter (Signed)
Dr.Klein aware of conversation with pt below. Dr.Klein rqst all of the pt past EKG's from [facility], for him to review. He will call pt directly.

## 2014-10-07 NOTE — Telephone Encounter (Signed)
Returned pt call. Pt sts that she has been doing ok since leaving the hospital. She has been taking all medications as prescribed. She sts that she did feel her hear racing a bit this afternoon which prompted her to cjk her bp and heartrate. Pt reports her bp this afternoon was 142/94 94bpm.  Pt sts that he heartrate stayed in the 90's for 1-2 hours. pt rechecked her heartrate an 1 hour ago and it was 58bpm.  Pt is currently asymptomatic. Discussed with Janell Quiet. Pt adv that the addition of the beta blocker should help with the duration and frequency of the palpitations. Pt baseline heartrate is in the upper 50's. We should not increase betablocker at this time. Pt should f/u as planned with EP in Aug. She should call the office if symptoms develop. Pt agreeable and verbalized understanding.

## 2014-10-07 NOTE — Telephone Encounter (Signed)
New Message       Pt calling stating that her HR is 96 and she wants to know if this is ok. Please call back and advise.

## 2014-10-22 ENCOUNTER — Encounter: Payer: Self-pay | Admitting: Internal Medicine

## 2014-10-22 ENCOUNTER — Telehealth: Payer: Self-pay | Admitting: Internal Medicine

## 2014-10-22 NOTE — Telephone Encounter (Signed)
Follow Up  Pt called request to speak back with Dr. Marikay Alar. No further details. Please call

## 2014-10-22 NOTE — Telephone Encounter (Signed)
lmtcb   Will also route to Dr. Graciela Husbands so that he may return her call if he has time while in hospital today.

## 2014-10-22 NOTE — Progress Notes (Signed)
Dr [facility] reviewed records and there is no documentation of atrial fibrillation. The patient's story suggests that the event that triggered the diagnosis and the therapy was in fact similar to what we now know is reentrant SVT. Hence, I have told her she could stop her warfarin. She would like to stop her beta blocker as she thinks it is increased her blood pressure. Her episodes are infrequent and so I said that that is a reasonable thing. She continue using her beta blocker as needed.  She is to follow-up with   AS next week at which time a discussion can be continued as to whether she would like to pursue catheter ablation as definitive therapy. It is also reasonable to discontinue her flecainide if she is still on it

## 2014-10-25 MED ORDER — METOPROLOL SUCCINATE ER 25 MG PO TB24: 25.0000 mg | ORAL_TABLET | Freq: Every day | ORAL | Status: DC | PRN

## 2014-10-25 NOTE — Telephone Encounter (Signed)
Reviewed with Dr. Graciela Husbands while in clinic this morning. Patient and I reviewed medications she is to stop:  Flecainide, Metoprolol. And Coumadin.  She agrees with this. She is very concerned about what she has been treated for the past 12 years and would like an explanation of this.  She is aware I will forward to Dr. Katrinka Blazing and his assist to address her questions. She is aware of upcoming appt with Gypsy Balsam, NP to review next step in plan of care.

## 2014-10-26 NOTE — Progress Notes (Signed)
Electrophysiology Office Note Date: 10/27/2014  ID:  VINIA Rodriguez, DOB 01-02-1929, MRN [id number]  PCP: Sissy Hoff, MD Primary Cardiologist: Katrinka Blazing Electrophysiologist: Graciela Husbands  CC: SVT follow up  Ann Rodriguez is a 81 y.o. female seen today for Dr Graciela Husbands.  She was recently admitted after a prolonged episode of tachycardia terminated with adenosine.  She was seen by Dr Graciela Husbands who discussed alternate AAD therapy or ablation.  The patient was discharged on BB in addition to her Flecainide but was unable to tolerate this 2/2 side effects.  She stopped her Flecainide, BB, and Warfarin and presents today for followup.  She reports that she has had shortness of breath with exertion and lightheadedness for the last 2 months. She has not had recurrent sustained SVT.  She is concerned about her diagnosis of atrial fibrillation and wishes to discuss with Dr Katrinka Blazing.  She denies PND, orthopnea, nausea, vomiting, syncope, edema, weight gain, or early satiety.  Echocardiogram 09/29/14 demonstrated EF 55-60%, grade 1 diastolic dysfunction, trivial pericardial effusion, LA 45.  Past Medical History  Diagnosis Date  . GERD (gastroesophageal reflux disease)   . Arthritis   . A-fib   . Hyperlipidemia   . Depression     PT'S HUSBAND AND SON HAVE DIED W/IN PAST YEAR 07/28/11  . Thyroid disease   . SVT (supraventricular tachycardia) 09/2014   Past Surgical History  Procedure Laterality Date  . Bunionectomy      left and right with hammer toe repair  . Appendectomy    . Oophorectomy    . Rotator cuff repair  1/13,  3/13    x 3  right/  . Foot surgery    . Salpingectomy    . Bladder surgery    . Nasal sinus surgery    . Knee arthroscopy  02/09/2011    Procedure: ARTHROSCOPY KNEE;  Surgeon: Jacki Cones;  Location: [facility];  Service: Orthopedics;  Laterality: Left;  Left Knee Arthroscopy with Menisectomy medial, abrasion chondroplasty medial, and synovectomy, supra patella pouch.  . Back surgery     lumbar  . Eye surgery      bilateral cataract extraction with IOL  . Total knee arthroplasty  09/05/2011    Procedure: TOTAL KNEE ARTHROPLASTY;  Surgeon: Jacki Cones, MD;  Location: [facility];  Service: Orthopedics;  Laterality: Left;  . Shoulder open rotator cuff repair Left 06/26/2012    Procedure: LEFT SHOULDER ROTATOR CUFF REPAIR WITH ANCHORS ;  Surgeon: Jacki Cones, MD;  Location: [facility];  Service: Orthopedics;  Laterality: Left;    Current Outpatient Prescriptions  Medication Sig Dispense Refill  . aspirin EC 81 MG tablet Take 81 mg by mouth daily.    Marland Kitchen atorvastatin (LIPITOR) 40 MG tablet Take 40 mg by mouth every evening.     . beta carotene w/minerals (OCUVITE) tablet Take 1 tablet by mouth daily with breakfast.     . calcium carbonate (OS-CAL) 600 MG TABS Take 1,800 mg by mouth daily with breakfast.     . cholecalciferol (VITAMIN D) 1000 UNITS tablet Take 1,000 Units by mouth 2 (two) times daily.     Marland Kitchen esomeprazole (NEXIUM) 20 MG capsule Take 20 mg by mouth every morning.     . fluticasone (FLONASE) 50 MCG/ACT nasal spray Place 1 spray into both nostrils once as needed for allergies.     Marland Kitchen meclizine (ANTIVERT) 25 MG tablet Take 25 mg by mouth as needed for dizziness.     Marland Kitchen  metoprolol succinate (TOPROL-XL) 25 MG 24 hr tablet Take 1 tablet (25 mg total) by mouth daily as needed. For SVT. 15 tablet 1  . polyethylene glycol (MIRALAX / GLYCOLAX) packet Take 17 g by mouth every other day.      No current facility-administered medications for this visit.    Allergies:   Review of patient's allergies indicates no known allergies.   Social History: Social History   Social History  . Marital Status: Widowed    Spouse Name: N/A  . Number of Children: N/A  . Years of Education: N/A   Occupational History  . Not on file.   Social History Main Topics  . Smoking status: Former Smoker -- 0.00 packs/day for 0 years    Types: Cigarettes    Quit date: 02/04/1989  . Smokeless  tobacco: Never Used  . Alcohol Use: No     Comment: socially  . Drug Use: No  . Sexual Activity: Not on file   Other Topics Concern  . Not on file   Social History Narrative    Family History: Family History  Problem Relation Age of Onset  . Diabetes Mother   . Diabetes Son     Review of Systems: All other systems reviewed and are otherwise negative except as noted above.   Physical Exam: VS:  BP 144/64 mmHg  Pulse 78  Ht 5\' 6"  (1.676 m)  Wt 150 lb (68.04 kg)  BMI 24.22 kg/m2 , BMI Body mass index is 24.22 kg/(m^2). Wt Readings from Last 3 Encounters:  10/27/14 150 lb (68.04 kg)  09/29/14 152 lb 8 oz (69.174 kg)  09/28/14 148 lb 6.4 oz (67.314 kg)    GEN- The patient is elderly appearing, alert and oriented x 3 today.  HEENT: normocephalic, atraumatic; sclera clear, conjunctiva pink; hearing intact; oropharynx clear; neck supple  Lungs- Clear to ausculation bilaterally, normal work of breathing. No wheezes, rales, rhonchi Heart- Regular rate and rhythm, no murmurs, rubs or gallops  GI- soft, non-tender, non-distended, bowel sounds present  Extremities- no clubbing, cyanosis, or edema; DP/PT/radial pulses 1+ bilaterally MS- no significant deformity or atrophy Skin- warm and dry, no rash or lesion Psych- euthymic mood, full affect Neuro- strength and sensation are intact   EKG:  EKG is ordered today. The ekg ordered today shows sinus rhythm, rate 78, non specific STT changes  Recent Labs: 09/29/2014: BUN 20; Creatinine, Ser 0.64; Hemoglobin 12.2; Platelets 220; Potassium 4.8; Sodium 142; TSH 3.955    Other studies Reviewed: Additional studies/ records that were reviewed today include: hospital records  Assessment and Plan: 1.  SVT The patient has had recurrent documented short RP tachycardia at a rate of 166 that was terminated by adenosine.  She has failed medical therapy with Flecainide.  She would like to pursue definitive therapy but has been having  problems with shortness of breath with exertion and will require work up prior to ablation.  2.  Paroxysmal atrial fibrillation Now felt to be SVT Warfarin discontinued by Dr Graciela Husbands She wishes to discuss with Dr Katrinka Blazing - will arrange office visit  3.  Shortness of breath with exertion She denies orthopnea or PND.  Echo last month with normal LV function Will check PFT's and lexiscan myoview  She has not had chest pain, but I am concerned that her exertional shortness of breath may be her anginal equivalent. She reports shortness of breath for the last 2 months - O2 sats normal during recent hospitalization.    Current  medicines are reviewed at length with the patient today.   The patient does not have concerns regarding her medicines.  The following changes were made today:  none  Labs/ tests ordered today include: Steffanie Dunn, PFT's   Disposition:   Follow up with Dr Katrinka Blazing; Dr Ladona Ridgel in 4 weeks to discuss ablation   Signed, Gypsy Balsam, NP 10/27/2014 1:51 PM   [facility] [address] [place] [address] [phone] (office) [phone] (fax)

## 2014-10-27 ENCOUNTER — Ambulatory Visit (INDEPENDENT_AMBULATORY_CARE_PROVIDER_SITE_OTHER): Payer: Medicare Other | Admitting: Nurse Practitioner

## 2014-10-27 ENCOUNTER — Encounter: Payer: Self-pay | Admitting: *Deleted

## 2014-10-27 ENCOUNTER — Telehealth (HOSPITAL_COMMUNITY): Payer: Self-pay

## 2014-10-27 ENCOUNTER — Encounter: Payer: Self-pay | Admitting: Nurse Practitioner

## 2014-10-27 VITALS — BP 144/64 | HR 78 | Ht 66.0 in | Wt 150.0 lb

## 2014-10-27 DIAGNOSIS — I471 Supraventricular tachycardia: Secondary | ICD-10-CM | POA: Diagnosis not present

## 2014-10-27 DIAGNOSIS — R0602 Shortness of breath: Secondary | ICD-10-CM

## 2014-10-27 NOTE — Telephone Encounter (Signed)
Patient given detailed instructions per Myocardial Perfusion Study Information Sheet for test on 11/01/2014 at 12:00. Patient Notified to arrive 15 minutes early, and that it is imperative to arrive on time for appointment to keep from having the test rescheduled. Patient verbalized understanding, per Allen Kell, CNMT, Dario Guardian

## 2014-10-27 NOTE — Patient Instructions (Addendum)
Medication Instructions:   Your physician recommends that you continue on your current medications as directed. Please refer to the Current Medication list given to you today.    Labwork:  NONE ORDER TODAY   Testing/Procedures:  Your physician has requested that you have a lexiscan myoview. For further information please visit [email or web address] Please follow instruction sheet, as given.  Your physician has recommended that you have a pulmonary function test. Pulmonary Function Tests are a group of tests that measure how well air moves in and out of your lungs.   Follow-Up:  4 to 6 WEEKS WITH DR Ladona Ridgel    Any Other Special Instructions Will Be Listed Below (If Applicable).

## 2014-10-28 ENCOUNTER — Ambulatory Visit (INDEPENDENT_AMBULATORY_CARE_PROVIDER_SITE_OTHER): Payer: Medicare Other | Admitting: Internal Medicine

## 2014-10-28 DIAGNOSIS — R0602 Shortness of breath: Secondary | ICD-10-CM

## 2014-10-28 LAB — PULMONARY FUNCTION TEST
DL/VA % pred: 83 %
DL/VA: 4.11 ml/min/mmHg/L
DLCO unc % pred: 75 %
DLCO unc: 19.22 ml/min/mmHg
FEF 25-75 POST: 1.83 L/s
FEF 25-75 PRE: 1.21 L/s
FEF2575-%Change-Post: 51 %
FEF2575-%PRED-PRE: 95 %
FEF2575-%Pred-Post: 145 %
FEV1-%Change-Post: 8 %
FEV1-%PRED-POST: 116 %
FEV1-%Pred-Pre: 106 %
FEV1-PRE: 2.02 L
FEV1-Post: 2.2 L
FEV1FVC-%Change-Post: 11 %
FEV1FVC-%PRED-PRE: 95 %
FEV6-%CHANGE-POST: -1 %
FEV6-%PRED-PRE: 118 %
FEV6-%Pred-Post: 116 %
FEV6-POST: 2.8 L
FEV6-Pre: 2.84 L
FEV6FVC-%Change-Post: 1 %
FEV6FVC-%PRED-POST: 106 %
FEV6FVC-%Pred-Pre: 105 %
FVC-%Change-Post: -2 %
FVC-%PRED-PRE: 112 %
FVC-%Pred-Post: 109 %
FVC-PRE: 2.88 L
FVC-Post: 2.81 L
PRE FEV1/FVC RATIO: 70 %
Post FEV1/FVC ratio: 78 %
Post FEV6/FVC ratio: 100 %
Pre FEV6/FVC Ratio: 99 %
RV % pred: 78 %
RV: 1.97 L
TLC % PRED: 95 %
TLC: 4.98 L

## 2014-10-28 NOTE — Progress Notes (Signed)
PFT done today. 

## 2014-10-30 ENCOUNTER — Encounter (HOSPITAL_COMMUNITY): Payer: Self-pay | Admitting: Emergency Medicine

## 2014-10-30 ENCOUNTER — Emergency Department (EMERGENCY_DEPARTMENT_HOSPITAL)
Admission: EM | Admit: 2014-10-30 | Discharge: 2014-10-30 | Disposition: A | Discharge status: Home / Self Care | Payer: Medicare Other | Source: Home / Self Care | Attending: Emergency Medicine | Admitting: Emergency Medicine

## 2014-10-30 ENCOUNTER — Inpatient Hospital Stay (HOSPITAL_COMMUNITY)
Admission: EM | Admit: 2014-10-30 | Discharge: 2014-11-04 | DRG: 274 | Disposition: A | Discharge status: Home / Self Care | Payer: Medicare Other | Attending: Internal Medicine | Admitting: Internal Medicine

## 2014-10-30 ENCOUNTER — Telehealth: Payer: Self-pay | Admitting: Physician Assistant

## 2014-10-30 ENCOUNTER — Encounter (HOSPITAL_COMMUNITY): Payer: Self-pay | Admitting: *Deleted

## 2014-10-30 DIAGNOSIS — I471 Supraventricular tachycardia, unspecified: Secondary | ICD-10-CM | POA: Diagnosis present

## 2014-10-30 DIAGNOSIS — G451 Carotid artery syndrome (hemispheric): Secondary | ICD-10-CM | POA: Diagnosis not present

## 2014-10-30 DIAGNOSIS — R0602 Shortness of breath: Secondary | ICD-10-CM | POA: Diagnosis not present

## 2014-10-30 DIAGNOSIS — Z87891 Personal history of nicotine dependence: Secondary | ICD-10-CM | POA: Diagnosis not present

## 2014-10-30 DIAGNOSIS — Z96652 Presence of left artificial knee joint: Secondary | ICD-10-CM | POA: Diagnosis present

## 2014-10-30 DIAGNOSIS — R4781 Slurred speech: Secondary | ICD-10-CM | POA: Diagnosis not present

## 2014-10-30 DIAGNOSIS — I639 Cerebral infarction, unspecified: Secondary | ICD-10-CM

## 2014-10-30 DIAGNOSIS — K219 Gastro-esophageal reflux disease without esophagitis: Secondary | ICD-10-CM | POA: Diagnosis present

## 2014-10-30 DIAGNOSIS — E785 Hyperlipidemia, unspecified: Secondary | ICD-10-CM | POA: Diagnosis present

## 2014-10-30 DIAGNOSIS — G459 Transient cerebral ischemic attack, unspecified: Secondary | ICD-10-CM | POA: Diagnosis not present

## 2014-10-30 DIAGNOSIS — R079 Chest pain, unspecified: Secondary | ICD-10-CM | POA: Diagnosis not present

## 2014-10-30 DIAGNOSIS — H5347 Heteronymous bilateral field defects: Secondary | ICD-10-CM | POA: Diagnosis not present

## 2014-10-30 DIAGNOSIS — Z7982 Long term (current) use of aspirin: Secondary | ICD-10-CM

## 2014-10-30 DIAGNOSIS — R4701 Aphasia: Secondary | ICD-10-CM | POA: Diagnosis not present

## 2014-10-30 LAB — CBC WITH DIFFERENTIAL/PLATELET
BASOS ABS: 0 10*3/uL (ref 0.0–0.1)
Basophils Relative: 0 % (ref 0–1)
EOS PCT: 2 % (ref 0–5)
Eosinophils Absolute: 0.2 10*3/uL (ref 0.0–0.7)
HCT: 44.1 % (ref 36.0–46.0)
Hemoglobin: 14.8 g/dL (ref 12.0–15.0)
LYMPHS PCT: 26 % (ref 12–46)
Lymphs Abs: 2.1 10*3/uL (ref 0.7–4.0)
MCH: 31.6 pg (ref 26.0–34.0)
MCHC: 33.6 g/dL (ref 30.0–36.0)
MCV: 94 fL (ref 78.0–100.0)
Monocytes Absolute: 0.9 10*3/uL (ref 0.1–1.0)
Monocytes Relative: 11 % (ref 3–12)
NEUTROS PCT: 61 % (ref 43–77)
Neutro Abs: 5.1 10*3/uL (ref 1.7–7.7)
Platelets: 279 10*3/uL (ref 150–400)
RBC: 4.69 MIL/uL (ref 3.87–5.11)
RDW: 14.5 % (ref 11.5–15.5)
WBC: 8.3 10*3/uL (ref 4.0–10.5)

## 2014-10-30 LAB — BASIC METABOLIC PANEL
Anion gap: 8 (ref 5–15)
BUN: 16 mg/dL (ref 6–20)
CHLORIDE: 108 mmol/L (ref 101–111)
CO2: 25 mmol/L (ref 22–32)
Calcium: 9.7 mg/dL (ref 8.9–10.3)
Creatinine, Ser: 0.74 mg/dL (ref 0.44–1.00)
GFR calc non Af Amer: >60 mL/min (ref >60)
Glucose, Bld: 112 mg/dL — ABNORMAL HIGH (ref 65–99)
POTASSIUM: 4.1 mmol/L (ref 3.5–5.1)
Sodium: 141 mmol/L (ref 135–145)

## 2014-10-30 LAB — I-STAT TROPONIN, ED: TROPONIN I, POC: 0.03 ng/mL (ref 0.00–0.08)

## 2014-10-30 LAB — TSH: TSH: 3.24 u[IU]/mL (ref 0.350–4.500)

## 2014-10-30 LAB — TROPONIN I: Troponin I: 0.03 ng/mL (ref <0.031)

## 2014-10-30 MED ORDER — ADENOSINE 6 MG/2ML IV SOLN
6.0000 mg | Freq: Once | INTRAVENOUS | Status: AC
  Administered 2014-10-30: 6 mg via INTRAVENOUS
  Filled 2014-10-30: qty 2

## 2014-10-30 MED ORDER — ADENOSINE 6 MG/2ML IV SOLN
12.0000 mg | Freq: Once | INTRAVENOUS | Status: AC
  Administered 2014-10-30: 12 mg via INTRAVENOUS

## 2014-10-30 MED ORDER — FLUTICASONE PROPIONATE 50 MCG/ACT NA SUSP: 1.0000 | Freq: Once | NASAL | Status: DC | PRN

## 2014-10-30 MED ORDER — DILTIAZEM HCL 30 MG PO TABS: 30.0000 mg | ORAL_TABLET | Freq: Four times a day (QID) | ORAL | Status: DC

## 2014-10-30 MED ORDER — PANTOPRAZOLE SODIUM 40 MG PO TBEC
40.0000 mg | DELAYED_RELEASE_TABLET | Freq: Every day | ORAL | Status: DC
  Administered 2014-10-30 – 2014-11-04 (×6): 40 mg via ORAL
  Filled 2014-10-30 (×6): qty 1

## 2014-10-30 MED ORDER — ATORVASTATIN CALCIUM 40 MG PO TABS
40.0000 mg | ORAL_TABLET | Freq: Every evening | ORAL | Status: DC
  Administered 2014-10-31 – 2014-11-03 (×4): 40 mg via ORAL
  Filled 2014-10-30 (×4): qty 1

## 2014-10-30 MED ORDER — ADENOSINE 6 MG/2ML IV SOLN
INTRAVENOUS | Status: AC
  Administered 2014-10-30: 12 mg via INTRAVENOUS
  Filled 2014-10-30: qty 4

## 2014-10-30 MED ORDER — POLYETHYLENE GLYCOL 3350 17 G PO PACK
17.0000 g | PACK | ORAL | Status: DC
  Administered 2014-10-31 – 2014-11-04 (×3): 17 g via ORAL
  Filled 2014-10-30 (×3): qty 1

## 2014-10-30 MED ORDER — METOPROLOL TARTRATE 25 MG PO TABS
25.0000 mg | ORAL_TABLET | Freq: Once | ORAL | Status: AC
  Administered 2014-10-30: 25 mg via ORAL
  Filled 2014-10-30: qty 1

## 2014-10-30 MED ORDER — METOPROLOL SUCCINATE ER 25 MG PO TB24: 12.5000 mg | ORAL_TABLET | Freq: Two times a day (BID) | ORAL | Status: DC

## 2014-10-30 MED ORDER — ADENOSINE 12 MG/4ML IV SOLN
12.0000 mg | Freq: Once | INTRAVENOUS | Status: DC
  Filled 2014-10-30: qty 4

## 2014-10-30 MED ORDER — DILTIAZEM HCL 30 MG PO TABS
30.0000 mg | ORAL_TABLET | Freq: Four times a day (QID) | ORAL | Status: DC
  Administered 2014-10-30 (×2): 30 mg via ORAL
  Filled 2014-10-30: qty 1

## 2014-10-30 MED ORDER — ACETAMINOPHEN 325 MG PO TABS: 650.0000 mg | ORAL_TABLET | ORAL | Status: DC | PRN

## 2014-10-30 MED ORDER — ONDANSETRON HCL 4 MG/2ML IJ SOLN: 4.0000 mg | Freq: Four times a day (QID) | INTRAMUSCULAR | Status: DC | PRN

## 2014-10-30 MED ORDER — ASPIRIN EC 81 MG PO TBEC
81.0000 mg | DELAYED_RELEASE_TABLET | Freq: Every day | ORAL | Status: DC
  Administered 2014-10-30 – 2014-11-04 (×6): 81 mg via ORAL
  Filled 2014-10-30 (×6): qty 1

## 2014-10-30 MED ORDER — SODIUM CHLORIDE 0.9 % IV BOLUS (SEPSIS)
500.0000 mL | Freq: Once | INTRAVENOUS | Status: AC
  Administered 2014-10-30: 500 mL via INTRAVENOUS

## 2014-10-30 MED ORDER — ADENOSINE 6 MG/2ML IV SOLN: 6.0000 mg | Freq: Once | INTRAVENOUS | Status: DC

## 2014-10-30 MED ORDER — DILTIAZEM HCL 30 MG PO TABS
30.0000 mg | ORAL_TABLET | Freq: Once | ORAL | Status: DC
  Filled 2014-10-30: qty 1

## 2014-10-30 NOTE — ED Notes (Signed)
Dr.Otter at bedside for cardioversion

## 2014-10-30 NOTE — Progress Notes (Addendum)
Called per Charge RN at 2230 regarding PT in SVT rate 150s, current symptoms including heart palpitations only. Pt recently admitted to [facility] for SVT, received adenosine in ED tonight. Cardiologist Dr. Gillermo Murdoch paged prior to me, orders received for adenosine to the bedside, MD en route. Upon my arrival at 2232 Pt found resting in bed, denies pain, denies chest pain or SOB, complains of mild heart palpitations. Pt placed on Zoll Pads, crash cart in room. 6 mg IV Adenosine given at 2240 with Cardiologist at bedside. Pt experienced a brief drop in HR to low 20s and rebounded back to 60s SR with Pacs. Also complained of brief moment of discomfort and "doom". EKG obtained. VS obtained after adenosine yielding HR 65, BP 151/88, RR 12, Po2 97% on RA. Pt denies chest pain or palpitations. Left resting comfortable in bed. [facility] RN to monitor closely, advised to notify myself and Provider for worsening changes.

## 2014-10-30 NOTE — H&P (Signed)
Patient ID: Ann Rodriguez MRN: [id number], DOB/AGE: November 12, 1928   Admit date: 10/30/2014   Primary Physician: Sissy Hoff, MD Primary Cardiologist: See EPIC  Pt. Profile:  Pt is an 81 yo F w/ a PMH significant for SVT probably AVnRT w/ multiple recent ER visits for symptomatic palpitations discharged earlier today returning w/ same.  Problem List  Past Medical History  Diagnosis Date  . GERD (gastroesophageal reflux disease)   . Arthritis   . A-fib   . Hyperlipidemia   . Depression     PT'S HUSBAND AND SON HAVE DIED W/IN PAST YEAR Jul 23, 2011  . Thyroid disease   . SVT (supraventricular tachycardia) 09/2014    Past Surgical History  Procedure Laterality Date  . Bunionectomy      left and right with hammer toe repair  . Appendectomy    . Oophorectomy    . Rotator cuff repair  1/13,  3/13    x 3  right/  . Foot surgery    . Salpingectomy    . Bladder surgery    . Nasal sinus surgery    . Knee arthroscopy  02/09/2011    Procedure: ARTHROSCOPY KNEE;  Surgeon: Jacki Cones;  Location: [facility];  Service: Orthopedics;  Laterality: Left;  Left Knee Arthroscopy with Menisectomy medial, abrasion chondroplasty medial, and synovectomy, supra patella pouch.  . Back surgery      lumbar  . Eye surgery      bilateral cataract extraction with IOL  . Total knee arthroplasty  09/05/2011    Procedure: TOTAL KNEE ARTHROPLASTY;  Surgeon: Jacki Cones, MD;  Location: [facility];  Service: Orthopedics;  Laterality: Left;  . Shoulder open rotator cuff repair Left 06/26/2012    Procedure: LEFT SHOULDER ROTATOR CUFF REPAIR WITH ANCHORS ;  Surgeon: Jacki Cones, MD;  Location: [facility];  Service: Orthopedics;  Laterality: Left;     Allergies  No Known Allergies  HPI  Please see following from excellent cardiology consult note written earlier today:  "The patient is a 81 year old Caucasian female with past medical history of hyperlipidemia, dyspnea on exertion, and SVT. She previously  carry the diagnosis of paroxysmal atrial fibrillation and was taking flecainide and Coumadin for years. She was most recently admitted in the hospital in July 2016. Electrophysiology service was consulted and she was seen by Dr. Graciela Husbands. Per Dr. Graciela Husbands, could not find strips documenting atrial fibrillation, but this may be in prior [facility] Cardiology record, her arrhythmia is more consistent with supraventricular tachycardia which terminated with adenosine. The episode of SVT has been increasing in frequency and duration despite flecainide therapy. Echocardiogram was done during that admission showed EF 55-60%, no RWMA, grade 1 diastolic dysfunction, mild AR, mild MR, moderate TR, PA peak pressure 34. Her previous record was reviewed, per documentation on 10/22/2014, Dr. Katrinka Blazing has reviewed previous record and find no documentation of atrial fibrillation, patient's story suggest the symptom and therapy was in fact similar to what we now know is reentrant SVT. Her Coumadin and fleicainide was discontinued. She was placed on a PRN dose of Toprol XL. She was seen in EP clinic on 10/27/2014, at which time, it was discussed with her regarding SVT ablation procedure. However given her dyspnea on exertion which has slightly worsened in the recent month, outpatient PFT and stress test was scheduled. Outpatient PFT was done on the 8/18 which showed predicted FEV1 108%, predicted FVC 112%, FEV1/FVC ratio 70%. Although slightly abnormal, however does not explain the  degree of her symptom. She was instructed to continue with outpatient Myoview.  Unfortunately before she could have her outpatient Myoview, around 9 PM on the night of 10/29/2014 she went into SVT again prompting her to seek medical attention at [facility] ED. So far she has had 3 episode of SVT all converted on adenosine. She has received 6 mg adenosine at 3:53 AM on 8/20, 12 mg adenosine at 4:10 AM, 6 mg adenosine at 5:53 AM terminating 3 episodes of SVT. She was given  25 mg metoprolol tartrate. Cardiology has been consulted for recurrent SVT. Patient denies any recent chest pain, fever, chill, cough, lower extremity edema, orthopnea or paroxysmal nocturnal dyspnea. She has normal CBC and BMP. Troponin negative."  Returned to the ER w/ symptomatic palpitations. HRs 120s on presentation. BP stable. EKG reveals narrow complex regular tachycardia similar to prior. Denies CP or SOB. Terminated w/ adenosine 6 mg IVP X 1. Cardiology consulted for admission.  Home Medications  Prior to Admission medications   Medication Sig Start Date End Date Taking? Authorizing Provider  aspirin EC 81 MG tablet Take 81 mg by mouth daily.    Historical Provider, MD  atorvastatin (LIPITOR) 40 MG tablet Take 40 mg by mouth every evening.     Historical Provider, MD  beta carotene w/minerals (OCUVITE) tablet Take 1 tablet by mouth daily with breakfast.     Historical Provider, MD  BIOTIN PO Take 1 tablet by mouth daily.    Historical Provider, MD  calcium carbonate (OS-CAL) 600 MG TABS Take 1,800 mg by mouth daily with breakfast.     Historical Provider, MD  cholecalciferol (VITAMIN D) 1000 UNITS tablet Take 1,000 Units by mouth 2 (two) times daily.     Historical Provider, MD  esomeprazole (NEXIUM) 20 MG capsule Take 20 mg by mouth every morning.     Historical Provider, MD  fluticasone (FLONASE) 50 MCG/ACT nasal spray Place 1 spray into both nostrils once as needed for allergies.  07/24/13   Historical Provider, MD  meclizine (ANTIVERT) 25 MG tablet Take 25 mg by mouth as needed for dizziness.  07/09/13   Historical Provider, MD  metoprolol succinate (TOPROL-XL) 25 MG 24 hr tablet Take 0.5 tablets (12.5 mg total) by mouth 2 (two) times daily. 10/30/14   Azalee Course, PA  polyethylene glycol (MIRALAX / GLYCOLAX) packet Take 17 g by mouth every other day.     Historical Provider, MD   Family History  Family History  Problem Relation Age of Onset  . Diabetes Mother   . Diabetes Son     Social History  Social History   Social History  . Marital Status: Widowed    Spouse Name: N/A  . Number of Children: N/A  . Years of Education: N/A   Occupational History  . Not on file.   Social History Main Topics  . Smoking status: Former Smoker -- 0.00 packs/day for 0 years    Types: Cigarettes    Quit date: 02/04/1989  . Smokeless tobacco: Never Used  . Alcohol Use: No     Comment: socially  . Drug Use: No  . Sexual Activity: Not on file   Other Topics Concern  . Not on file   Social History Narrative     Review of Systems General:  No chills, fever, night sweats or weight changes.  Cardiovascular:  No chest pain, dyspnea on exertion, edema, orthopnea, palpitations, paroxysmal nocturnal dyspnea. Dermatological: No rash, lesions/masses Respiratory: No cough, dyspnea Urologic: No  hematuria, dysuria Abdominal:   No nausea, vomiting, diarrhea, bright red blood per rectum, melena, or hematemesis Neurologic:  No visual changes, wkns, changes in mental status. All other systems reviewed and are otherwise negative except as noted above.  Physical Exam  Blood pressure 159/78, pulse 80, temperature 97.5 F (36.4 C), temperature source Oral, resp. rate 29, height 5\' 6"  (1.676 m), weight 148 lb (67.132 kg), SpO2 100 %.  General: Pleasant, NAD Psych: Normal affect. Neuro: Alert and oriented X 3. Moves all extremities spontaneously HEENT: Normal  Neck: Supple without bruits or JVD Lungs:  Resp regular and unlabored, CTA Heart: RRR no s3, s4, or murmurs Abdomen: Soft, non-tender, non-distended, BS + x 4 Extremities: No clubbing, cyanosis or edema. DP/PT/Radials 2+ and equal bilaterally  Labs  Troponin (Point of Care Test)  Recent Labs  10/30/14 0422  TROPIPOC 0.03   No results for input(s): CKTOTAL, CKMB, TROPONINI in the last 72 hours. Lab Results  Component Value Date   WBC 8.3 10/30/2014   HGB 14.8 10/30/2014   HCT 44.1 10/30/2014   MCV 94.0  10/30/2014   PLT 279 10/30/2014    Recent Labs Lab 10/30/14 0407  NA 141  K 4.1  CL 108  CO2 25  BUN 16  CREATININE 0.74  CALCIUM 9.7  GLUCOSE 112*   No results found for: CHOL, HDL, LDLCALC, TRIG Lab Results  Component Value Date   DDIMER 0.99* 01/26/2013   Radiology/Studies  No results found.  ECG - narrow complex regular tachycardia, some lateral ST depressions/T wave inversions  ASSESSMENT AND PLAN Pt is an 81 yo F w/ a PMH significant for SVT probably AVnRT w/ multiple recent ER visits for symptomatic palpitations discharged earlier today returning w/ same.  #SVT: Narrow complex, regular. Short RP tachycardia likely AVnRT as is the case in ~65-70% percent of patients. Broke through AAD in the past and continues to occur on metop. Will switch to dilt but pt likely needs ablation. -admit to tele -stop metop, start dilt IR 30 mg PO q d -can perform ischemia eval as planned as inpatient -consult EP to discuss ablation  Signed, Vista Mink, MD 10/30/2014, 8:16 PM

## 2014-10-30 NOTE — ED Provider Notes (Signed)
CSN: [id number]     Arrival date & time 10/30/14  0332 History  This chart was scribed for Ann Severin, MD by Tanda Rockers, ED Scribe. This patient was seen in room A13C/A13C and the patient's care was started at 3:35 AM.  Chief Complaint  Patient presents with  . Chest Pain   The history is provided by the patient. No language interpreter was used.     HPI Comments: Ann Rodriguez is a 81 y.o. female who presents to the Emergency Department complaining of sudden onset, tachycardia that began around 9:30 PM last night (6 hours ago). Pt states her heart rate was approximately 160 bpm. She notes it went down to 90 bpm after 2-3 minutes of the tachycardia but then went back up. She states that the highest her heart rate was was approximately 170 bpm. She mentions similar symptoms in the past when she was in SVT. Pt had to be given adenosine twice during that episode. She denies chest pain, worsening shortness of breath, or any other associated symptoms.    Past Medical History  Diagnosis Date  . GERD (gastroesophageal reflux disease)   . Arthritis   . A-fib   . Hyperlipidemia   . Depression     PT'S HUSBAND AND SON HAVE DIED W/IN PAST YEAR Jul 30, 2011  . Thyroid disease   . SVT (supraventricular tachycardia) 09/2014   Past Surgical History  Procedure Laterality Date  . Bunionectomy      left and right with hammer toe repair  . Appendectomy    . Oophorectomy    . Rotator cuff repair  1/13,  3/13    x 3  right/  . Foot surgery    . Salpingectomy    . Bladder surgery    . Nasal sinus surgery    . Knee arthroscopy  02/09/2011    Procedure: ARTHROSCOPY KNEE;  Surgeon: Jacki Cones;  Location: [facility];  Service: Orthopedics;  Laterality: Left;  Left Knee Arthroscopy with Menisectomy medial, abrasion chondroplasty medial, and synovectomy, supra patella pouch.  . Back surgery      lumbar  . Eye surgery      bilateral cataract extraction with IOL  . Total knee arthroplasty  09/05/2011   Procedure: TOTAL KNEE ARTHROPLASTY;  Surgeon: Jacki Cones, MD;  Location: [facility];  Service: Orthopedics;  Laterality: Left;  . Shoulder open rotator cuff repair Left 06/26/2012    Procedure: LEFT SHOULDER ROTATOR CUFF REPAIR WITH ANCHORS ;  Surgeon: Jacki Cones, MD;  Location: [facility];  Service: Orthopedics;  Laterality: Left;   Family History  Problem Relation Age of Onset  . Diabetes Mother   . Diabetes Son    Social History  Substance Use Topics  . Smoking status: Former Smoker -- 0.00 packs/day for 0 years    Types: Cigarettes    Quit date: 02/04/1989  . Smokeless tobacco: Never Used  . Alcohol Use: No     Comment: socially   OB History    No data available     Review of Systems  Respiratory: Negative for shortness of breath.   Cardiovascular: Positive for palpitations. Negative for chest pain.  All other systems reviewed and are negative.     Allergies  Review of patient's allergies indicates no known allergies.  Home Medications   Prior to Admission medications   Medication Sig Start Date End Date Taking? Authorizing Provider  aspirin EC 81 MG tablet Take 81 mg by mouth daily.  Historical Provider, MD  atorvastatin (LIPITOR) 40 MG tablet Take 40 mg by mouth every evening.     Historical Provider, MD  beta carotene w/minerals (OCUVITE) tablet Take 1 tablet by mouth daily with breakfast.     Historical Provider, MD  calcium carbonate (OS-CAL) 600 MG TABS Take 1,800 mg by mouth daily with breakfast.     Historical Provider, MD  cholecalciferol (VITAMIN D) 1000 UNITS tablet Take 1,000 Units by mouth 2 (two) times daily.     Historical Provider, MD  esomeprazole (NEXIUM) 20 MG capsule Take 20 mg by mouth every morning.     Historical Provider, MD  fluticasone (FLONASE) 50 MCG/ACT nasal spray Place 1 spray into both nostrils once as needed for allergies.  07/24/13   Historical Provider, MD  meclizine (ANTIVERT) 25 MG tablet Take 25 mg by mouth as needed for  dizziness.  07/09/13   Historical Provider, MD  metoprolol succinate (TOPROL-XL) 25 MG 24 hr tablet Take 1 tablet (25 mg total) by mouth daily as needed. For SVT. 10/25/14   Duke Salvia, MD  polyethylene glycol [facility] / Ethelene Hal) packet Take 17 g by mouth every other day.     Historical Provider, MD   Triage Vitals: BP 132/93 mmHg  Pulse 168  Temp(Src) 98.4 F (36.9 C)  Resp 18  Ht  (1.676 m)  Wt 148 lb (67.132 kg)  BMI 23.90 kg/m2  SpO2 99%   Physical Exam  ED Course  Procedures (including critical care time)  DIAGNOSTIC STUDIES: Oxygen Saturation is 99% on RA, normal by my interpretation.    COORDINATION OF CARE: 3:47 AM-Discussed treatment plan which includes adenosine with pt at bedside and pt agreed to plan.   3:53 AM-6mg  of Adenosine given for SVT, resolution of SVT.  Labs Review Labs Reviewed  BASIC METABOLIC PANEL - Abnormal; Notable for the following:    Glucose, Bld 112 (*)    All other components within normal limits  CBC WITH DIFFERENTIAL/PLATELET  I-STAT TROPOININ, ED    Imaging Review No results found. I have personally reviewed and evaluated these images and lab results as part of my medical decision-making.   EKG Interpretation   Date/Time:  Saturday October 30 2014 03:41:26 EDT Ventricular Rate:  167 PR Interval:  108 QRS Duration: 78 QT Interval:  278 QTC Calculation: 463 R Axis:   12 Text Interpretation:  Supraventricular tachycardia Repolarization  abnormality, prob rate related Confirmed by Norberta Stobaugh  MD, Leonard Feigel ([id number]) on  10/30/2014 4:51:59 AM     CRITICAL CARE Performed by: Olivia Mackie Total critical care time: 90 min Critical care time was exclusive of separately billable procedures and treating other patients. Critical care was necessary to treat or prevent imminent or life-threatening deterioration. Critical care was time spent personally by me on the following activities: development of treatment plan with patient and/or  surrogate as well as nursing, discussions with consultants, evaluation of patient's response to treatment, examination of patient, obtaining history from patient or surrogate, ordering and performing treatments and interventions, ordering and review of laboratory studies, ordering and review of radiographic studies, pulse oximetry and re-evaluation of patient's condition.  MDM   Final diagnoses:  SVT (supraventricular tachycardia)    I personally performed the services described in this documentation, which was scribed in my presence. The recorded information has been reviewed and is accurate.  81 year old female with recurrent SVT.  Plan for adenosine as vagal maneuvers here have not resolved her SVT.  Patient has been seen  for this in the past by cardiology, is awaiting workup prior to ablation.  She denies any chest pain.  She has ongoing dyspnea, seen earlier today for pulmonary testing.   4:14 AM Pt with recurrence of SVT, given 12 mg of adenosine with resolution of SVT.  6:25 AM Patient with recurrence of SVT.  Again, 6 mg of adenosine were given, with good results.  I had discussed the case with cardiology around 545, and at that time, the plan was to discharge her to home.  As patient has had 3 separate episodes of recurrent SVT, I feel that she will need observation by cardiology and possible consult by EP.  Ann Severin, MD 10/30/14 [phone]

## 2014-10-30 NOTE — Discharge Instructions (Signed)
Supraventricular Tachycardia °Supraventricular tachycardia (SVT) is an abnormal heart rhythm (arrhythmia) that causes the heart to beat very fast (tachycardia). This kind of fast heartbeat originates in the upper chambers of the heart (atria). SVT can cause the heart to beat greater than 100 beats per minute. SVT can have a rapid burst of heartbeats. This can start and stop suddenly without warning and is called nonsustained. SVT can also be sustained, in which the heart beats at a continuous fast rate.  °CAUSES  °There can be different causes of SVT. Some of these include: °· Heart valve problems such as mitral valve prolapse. °· An enlarged heart (hypertrophic cardiomyopathy). °· Congenital heart problems. °· Heart inflammation (pericarditis). °· Hyperthyroidism. °· Low potassium or magnesium levels. °· Caffeine. °· Drug use such as cocaine, methamphetamines, or stimulants. °· Some over-the-counter medicines such as: °¨ Decongestants. °¨ Diet medicines. °¨ Herbal medicines. °SYMPTOMS  °Symptoms of SVT can vary. Symptoms depend on whether the SVT is sustained or nonsustained. You may experience: °· No symptoms (asymptomatic). °· An awareness of your heart beating rapidly (palpitations). °· Shortness of breath. °· Chest pain or pressure. °If your blood pressure drops because of the SVT, you may experience: °· Fainting or near fainting. °· Weakness. °· Dizziness. °DIAGNOSIS  °Different tests can be performed to diagnose SVT, such as: °· An electrocardiogram (EKG). This is a painless test that records the electrical activity of your heart. °· Holter monitor. This is a 24 hour recording of your heart rhythm. You will be given a diary. Write down all symptoms that you have and what you were doing at the time you experienced symptoms. °· Arrhythmia monitor. This is a small device that your wear for several weeks. It records the heart rhythm when you have symptoms. °· Echocardiogram. This is an imaging test to help detect  abnormal heart structure such as congenital abnormalities, heart valve problems, or heart enlargement. °· Stress test. This test can help determine if the SVT is related to exercise. °· Electrophysiology study (EPS). This is a procedure that evaluates your heart's electrical system and can help your caregiver find the cause of your SVT. °TREATMENT  °Treatment of SVT depends on the symptoms, how often it recurs, and whether there are any underlying heart problems.  °· If symptoms are rare and no other cardiac disease is present, no treatment may be needed. °· Blood work may be done to check potassium, magnesium, and thyroid hormone levels to see if they are abnormal. If these levels are abnormal, treatment to correct the problems will occur. °Medicines °Your caregiver may use oral medicines to treat SVT. These medicines are given for long-term control of SVT. Medicines may be used alone or in combination with other treatments. These medicines work to slow nerve impulses in the heart muscle. These medicines can also be used to treat high blood pressure. Some of these medicines may include: °· Calcium channel blockers. °· Beta blockers. °· Digoxin. °Nonsurgical procedures °Nonsurgical techniques may be used if oral medicines do not work. Some examples include: °· Cardioversion. This technique uses either drugs or an electrical shock to restore a normal heart rhythm. °¨ Cardioversion drugs may be given through an intravenous (IV) line to help "reset" the heart rhythm. °¨ In electrical cardioversion, the caregiver shocks your heart to stop its beat for a split second. This helps to reset the heart to a normal rhythm. °· Ablation. This procedure is done under mild sedation. High frequency radio wave energy is used to   destroy the area of heart tissue responsible for the SVT. °HOME CARE INSTRUCTIONS  °· Do not smoke. °· Only take medicines prescribed by your caregiver. Check with your caregiver before using over-the-counter  medicines. °· Check with your caregiver about how much alcohol and caffeine (coffee, tea, colas, or chocolate) you may have. °· It is very important to keep all follow-up referrals and appointments in order to properly manage this problem. °SEEK IMMEDIATE MEDICAL CARE IF: °· You have dizziness. °· You faint or nearly faint. °· You have shortness of breath. °· You have chest pain or pressure. °· You have sudden nausea or vomiting. °· You have profuse sweating. °· You are concerned about how long your symptoms last. °· You are concerned about the frequency of your SVT episodes. °If you have the above symptoms, call your local emergency services (911 in [place] immediately. Do not drive yourself to the hospital. °MAKE SURE YOU:  °· Understand these instructions. °· Will watch your condition. °· Will get help right away if you are not doing well or get worse. °Document Released: 02/26/2005 Document Revised: 05/21/2011 Document Reviewed: 06/10/2008 °[employer]® Patient Information ©2015 [employer]. This information is not intended to replace advice given to you by your health care provider. Make sure you discuss any questions you have with your health care provider. ° °

## 2014-10-30 NOTE — ED Notes (Signed)
Pads placed on patient .

## 2014-10-30 NOTE — ED Notes (Signed)
Patient here with complaint of "heart racing". Also reports "fluttering in chest", and shortness of breath. States she was seen today for the same in ED. Symptoms recurred around 1730.

## 2014-10-30 NOTE — ED Notes (Signed)
HR 160, SVT; Dr.Otter at bedside for adenosine

## 2014-10-30 NOTE — ED Provider Notes (Signed)
Patient has been seen by cardiology. They feel she can be discharged. She was prescribed 12.5 mg of Toprol-XL twice daily. She has had no further episodes of SVT. No chest pain or shortness of breath.  Glynn Octave, MD 10/30/14 [phone]

## 2014-10-30 NOTE — ED Notes (Signed)
Pt denied any complaints at discharge. Pt discharged home with family.

## 2014-10-30 NOTE — Consult Note (Signed)
Patient ID: Ann Rodriguez MRN: [id number], DOB/AGE: 1928-12-11   Admit date: 10/30/2014   Primary Physician: Sissy Hoff, MD Primary Cardiologist: Dr. Katrinka Blazing Primary Electrophysiologist: Dr. Graciela Husbands  Pt. Profile:  81 year old Caucasian female with past medical history of hyperlipidemia, dyspnea on exertion, and SVT who previously failed fleicainide therapy and on PRN toprol XL presented with recurrent SVT  Problem List  Past Medical History  Diagnosis Date  . GERD (gastroesophageal reflux disease)   . Arthritis   . A-fib   . Hyperlipidemia   . Depression     PT'S HUSBAND AND SON HAVE DIED W/IN PAST YEAR 02-Aug-2011  . Thyroid disease   . SVT (supraventricular tachycardia) 09/2014    Past Surgical History  Procedure Laterality Date  . Bunionectomy      left and right with hammer toe repair  . Appendectomy    . Oophorectomy    . Rotator cuff repair  1/13,  3/13    x 3  right/  . Foot surgery    . Salpingectomy    . Bladder surgery    . Nasal sinus surgery    . Knee arthroscopy  02/09/2011    Procedure: ARTHROSCOPY KNEE;  Surgeon: Jacki Cones;  Location: [facility];  Service: Orthopedics;  Laterality: Left;  Left Knee Arthroscopy with Menisectomy medial, abrasion chondroplasty medial, and synovectomy, supra patella pouch.  . Back surgery      lumbar  . Eye surgery      bilateral cataract extraction with IOL  . Total knee arthroplasty  09/05/2011    Procedure: TOTAL KNEE ARTHROPLASTY;  Surgeon: Jacki Cones, MD;  Location: [facility];  Service: Orthopedics;  Laterality: Left;  . Shoulder open rotator cuff repair Left 06/26/2012    Procedure: LEFT SHOULDER ROTATOR CUFF REPAIR WITH ANCHORS ;  Surgeon: Jacki Cones, MD;  Location: [facility];  Service: Orthopedics;  Laterality: Left;     Allergies  No Known Allergies  HPI  The patient is a 81 year old Caucasian female with past medical history of hyperlipidemia, dyspnea on exertion, and SVT. She previously carry the  diagnosis of paroxysmal atrial fibrillation and was taking flecainide and Coumadin for years. She was most recently admitted in the hospital in July 2016. Electrophysiology service was consulted and she was seen by Dr. Graciela Husbands. Per Dr. Graciela Husbands, could not find strips documenting atrial fibrillation, but this may be in prior [facility] record, her arrhythmia is more consistent with supraventricular tachycardia which terminated with adenosine. The episode of SVT has been increasing in frequency and duration despite flecainide therapy. Echocardiogram was done during that admission showed EF 55-60%, no RWMA, grade 1 diastolic dysfunction, mild AR, mild MR, moderate TR, PA peak pressure 34. Her previous record was reviewed, per documentation on 10/22/2014, Dr. Katrinka Blazing has reviewed previous record and find no documentation of atrial fibrillation, patient's story suggest the symptom and therapy was in fact similar to what we now know is reentrant SVT. Her Coumadin and fleicainide was discontinued. She was placed on a PRN dose of Toprol XL. She was seen in EP clinic on 10/27/2014, at which time, it was discussed with her regarding SVT ablation procedure. However given her dyspnea on exertion which has slightly worsened in the recent month, outpatient PFT and stress test was scheduled. Outpatient PFT was done on the 8/18 which showed predicted FEV1 108%, predicted FVC 112%, FEV1/FVC ratio 70%. Although slightly abnormal, however does not explain the degree of her symptom. She was instructed to  continue with outpatient Myoview.  Unfortunately before she could have her outpatient Myoview, around 9 PM on the night of 10/29/2014 she went into SVT again prompting her to seek medical attention at [facility] ED. So far she has had 3 episode of SVT all converted on adenosine. She has received 6 mg adenosine at 3:53 AM on 8/20, 12 mg adenosine at 4:10 AM, 6 mg adenosine at 5:53 AM terminating 3 episodes of SVT. She was given 25 mg  metoprolol tartrate. Cardiology has been consulted for recurrent SVT. Patient denies any recent chest pain, fever, chill, cough, lower extremity edema, orthopnea or paroxysmal nocturnal dyspnea. She has normal CBC and BMP. Troponin negative.   Home Medications  Prior to Admission medications   Medication Sig Start Date End Date Taking? Authorizing Provider  aspirin EC 81 MG tablet Take 81 mg by mouth daily.   Yes Historical Provider, MD  atorvastatin (LIPITOR) 40 MG tablet Take 40 mg by mouth every evening.    Yes Historical Provider, MD  beta carotene w/minerals (OCUVITE) tablet Take 1 tablet by mouth daily with breakfast.    Yes Historical Provider, MD  BIOTIN PO Take 1 tablet by mouth daily.   Yes Historical Provider, MD  calcium carbonate (OS-CAL) 600 MG TABS Take 1,800 mg by mouth daily with breakfast.    Yes Historical Provider, MD  cholecalciferol (VITAMIN D) 1000 UNITS tablet Take 1,000 Units by mouth 2 (two) times daily.    Yes Historical Provider, MD  esomeprazole (NEXIUM) 20 MG capsule Take 20 mg by mouth every morning.    Yes Historical Provider, MD  fluticasone (FLONASE) 50 MCG/ACT nasal spray Place 1 spray into both nostrils once as needed for allergies.  07/24/13  Yes Historical Provider, MD  meclizine (ANTIVERT) 25 MG tablet Take 25 mg by mouth as needed for dizziness.  07/09/13  Yes Historical Provider, MD  metoprolol succinate (TOPROL-XL) 25 MG 24 hr tablet Take 1 tablet (25 mg total) by mouth daily as needed. For SVT. 10/25/14  Yes Duke Salvia, MD  polyethylene glycol [facility] Ethelene Hal) packet Take 17 g by mouth every other day.    Yes Historical Provider, MD    Family History  Family History  Problem Relation Age of Onset  . Diabetes Mother   . Diabetes Son     Social History  Social History   Social History  . Marital Status: Widowed    Spouse Name: N/A  . Number of Children: N/A  . Years of Education: N/A   Occupational History  . Not on file.   Social  History Main Topics  . Smoking status: Former Smoker -- 0.00 packs/day for 0 years    Types: Cigarettes    Quit date: 02/04/1989  . Smokeless tobacco: Never Used  . Alcohol Use: No     Comment: socially  . Drug Use: No  . Sexual Activity: Not on file   Other Topics Concern  . Not on file   Social History Narrative     Review of Systems General:  No chills, fever, night sweats or weight changes.  Cardiovascular:  No chest pain, edema, orthopnea, paroxysmal nocturnal dyspnea. +DOE, palpitations Dermatological: No rash, lesions/masses Respiratory: No cough, dyspnea Urologic: No hematuria, dysuria Abdominal:   No nausea, vomiting, diarrhea, bright red blood per rectum, melena, or hematemesis Neurologic:  No visual changes, wkns, changes in mental status. All other systems reviewed and are otherwise negative except as noted above.  Physical Exam  Blood  pressure 125/75, pulse 58, temperature 98.4 F (36.9 C), resp. rate 25, height 5\' 6"  (1.676 m), weight 148 lb (67.132 kg), SpO2 98 %.  General: Pleasant, NAD Psych: Normal affect. Neuro: Alert and oriented X 3. Moves all extremities spontaneously. HEENT: Normal  Neck: Supple without bruits or JVD. Lungs:  Resp regular and unlabored, CTA. Heart: RRR no s3, s4, or murmurs. Abdomen: Soft, non-tender, non-distended, BS + x 4.  Extremities: No clubbing, cyanosis or edema. DP/PT/Radials 2+ and equal bilaterally.  Labs  Troponin [facility] of Care Test)  Recent Labs  10/30/14 0422  TROPIPOC 0.03   No results for input(s): CKTOTAL, CKMB, TROPONINI in the last 72 hours. Lab Results  Component Value Date   WBC 8.3 10/30/2014   HGB 14.8 10/30/2014   HCT 44.1 10/30/2014   MCV 94.0 10/30/2014   PLT 279 10/30/2014    Recent Labs Lab 10/30/14 0407  NA 141  K 4.1  CL 108  CO2 25  BUN 16  CREATININE 0.74  CALCIUM 9.7  GLUCOSE 112*   No results found for: CHOL, HDL, LDLCALC, TRIG Lab Results  Component Value Date   DDIMER  0.99* 01/26/2013     Radiology/Studies  No results found.  ECG  SVT with diffuse ST depression at HR 160s  Echocardiogram 09/29/2014  LV EF: 55% -  60%  ------------------------------------------------------------------- Indications:   Dyspnea 786.09.  ------------------------------------------------------------------- History:  PMH:  Tachycardia.  ------------------------------------------------------------------- Study Conclusions  - Left ventricle: The cavity size was normal. Systolic function was normal. The estimated ejection fraction was in the range of 55% to 60%. Wall motion was normal; there were no regional wall motion abnormalities. Doppler parameters are consistent with abnormal left ventricular relaxation (grade 1 diastolic dysfunction). - Aortic valve: There was mild regurgitation. - Mitral valve: Structurally normal valve. There was mild regurgitation. - Left atrium: The atrium was mildly dilated. - Right ventricle: The cavity size was mildly dilated. Wall thickness was normal. - Right atrium: The atrium was mildly dilated. - Tricuspid valve: There was moderate regurgitation. - Pulmonary arteries: Systolic pressure was within the normal range. PA peak pressure: 34 mm Hg (S). - Inferior vena cava: The vessel was dilated. The respirophasic diameter changes were blunted (< 50%), consistent with elevated central venous pressure. - Pericardium, extracardiac: A trivial pericardial effusion was identified. Features were not consistent with tamponade physiology.    ASSESSMENT AND PLAN  1. Recurrent SVT  - previously thought to be PAF, however strips reviewed recently by Dr. Katrinka Blazing and Dr. Graciela Husbands did not support diagnosis of SVT, her coumadin was stopped. She was previously on Fleicainide, this was discontinued as she had breakthrough SVT anyway.  - she is on PRN 25mg  Toprol XL at home, i have discussed with her rate control therapy  with scheduled metoprolol, however family is concerned about bradycardia with baseline HR 50-60s. In the hospital, her HR has been ranging 50-80s. Currently in 50s after receiving 25mg  metoprolol 1 hour ago  - will discuss with MD, potentially start low dose metoprolol tartrate. ?if any benefit of restart flecainide when she recently just failed flecainide therapy, although per pt, she never had back to back recurrent SVT before like this while on fleicainide.  - she will need outpatient myoview, trop negative, did have some diffuse ST depression when HR was 160s, but likely rate related. Denies any recent CP.   - ultimately, SVT ablation may offer to best treatment option.     2. DOE  - s/p PFT  on 10/28/2014, pending myoview on Monday  3. Hyperlipidemia   Signed, Azalee Course, PA-C 10/30/2014, 7:51 AM  Patient seen with PA, agree with the above note.  She came to the ER with recurrent SVT.  Short R-P SVT, possible AVNRT.  She has failed flecainide in the past.  She is on prn Toprol XL.  I had them give her metoprolol 25 mg x 1 in the ER.  HR in the 50s on this regimen.  I think she will be ok to go home on scheduled Toprol XL 12.5 mg bid.  She will continue this until followup with EP.  I think she will need SVT ablation.   Marca Ancona 10/30/2014 8:38 AM

## 2014-10-30 NOTE — ED Notes (Signed)
Pt to ED from home c/o tachycardia since 2100. Denies chest pain, c/o shortness of breath. Has been off Coumadin x 1 week. Was being treated for "a-fib for 14 years and about a month ago they told me it was SVT not a-fib." EDP at bedside; HR  170

## 2014-10-30 NOTE — Progress Notes (Signed)
Patient admitted to room 38. Alert and oriented X4 oriented to room and unit.

## 2014-10-30 NOTE — ED Notes (Signed)
Pt noted to be in SVT again; MD aware

## 2014-10-30 NOTE — ED Provider Notes (Signed)
CSN: [id number]     Arrival date & time 10/30/14  1904 History   First MD Initiated Contact with Patient 10/30/14 1931     Chief Complaint  Patient presents with  . Tachycardia     (Consider location/radiation/quality/duration/timing/severity/associated sxs/prior Treatment) HPI Patient was discharged from the emergency department today around 3 PM. She had been treated for SVT that had been recurrent 3 times. She had a cardiology consultation and the plan was for the patient to continue taking Toprol. She reports she was okay for several hours after leaving the emergency department but then the racing of her heart began again. He denies associated chest pain. She reports that she's been feeling mildly short of breath. She has not had associated syncope or lightheadedness. Patient has otherwise been well recently without fever, vomiting or diarrhea. She reports that for 14 years she was diagnosed with atrial fibrillation and had been on flecainide and Coumadin. She states then more recently her diagnosis changed to SVT and she was taken off of those medications. Past Medical History  Diagnosis Date  . GERD (gastroesophageal reflux disease)   . Arthritis   . A-fib   . Hyperlipidemia   . Depression     PT'S HUSBAND AND SON HAVE DIED W/IN PAST YEAR 07-13-11  . Thyroid disease   . SVT (supraventricular tachycardia) 09/2014   Past Surgical History  Procedure Laterality Date  . Bunionectomy      left and right with hammer toe repair  . Appendectomy    . Oophorectomy    . Rotator cuff repair  1/13,  3/13    x 3  right/  . Foot surgery    . Salpingectomy    . Bladder surgery    . Nasal sinus surgery    . Knee arthroscopy  02/09/2011    Procedure: ARTHROSCOPY KNEE;  Surgeon: Jacki Cones;  Location: [facility];  Service: Orthopedics;  Laterality: Left;  Left Knee Arthroscopy with Menisectomy medial, abrasion chondroplasty medial, and synovectomy, supra patella pouch.  . Back surgery     lumbar  . Eye surgery      bilateral cataract extraction with IOL  . Total knee arthroplasty  09/05/2011    Procedure: TOTAL KNEE ARTHROPLASTY;  Surgeon: Jacki Cones, MD;  Location: [facility];  Service: Orthopedics;  Laterality: Left;  . Shoulder open rotator cuff repair Left 06/26/2012    Procedure: LEFT SHOULDER ROTATOR CUFF REPAIR WITH ANCHORS ;  Surgeon: Jacki Cones, MD;  Location: [facility];  Service: Orthopedics;  Laterality: Left;   Family History  Problem Relation Age of Onset  . Diabetes Mother   . Diabetes Son    Social History  Substance Use Topics  . Smoking status: Former Smoker -- 0.00 packs/day for 0 years    Types: Cigarettes    Quit date: 02/04/1989  . Smokeless tobacco: Never Used  . Alcohol Use: No     Comment: socially   OB History    No data available     Review of Systems 10 Systems reviewed and are negative for acute change except as noted in the HPI.    Allergies  Review of patient's allergies indicates no known allergies.  Home Medications   Prior to Admission medications   Medication Sig Start Date End Date Taking? Authorizing Provider  aspirin EC 81 MG tablet Take 81 mg by mouth daily.    Historical Provider, MD  atorvastatin (LIPITOR) 40 MG tablet Take 40 mg by mouth every  evening.     Historical Provider, MD  beta carotene w/minerals (OCUVITE) tablet Take 1 tablet by mouth daily with breakfast.     Historical Provider, MD  BIOTIN PO Take 1 tablet by mouth daily.    Historical Provider, MD  calcium carbonate (OS-CAL) 600 MG TABS Take 1,800 mg by mouth daily with breakfast.     Historical Provider, MD  cholecalciferol (VITAMIN D) 1000 UNITS tablet Take 1,000 Units by mouth 2 (two) times daily.     Historical Provider, MD  esomeprazole (NEXIUM) 20 MG capsule Take 20 mg by mouth every morning.     Historical Provider, MD  fluticasone (FLONASE) 50 MCG/ACT nasal spray Place 1 spray into both nostrils once as needed for allergies.  07/24/13    Historical Provider, MD  meclizine (ANTIVERT) 25 MG tablet Take 25 mg by mouth as needed for dizziness.  07/09/13   Historical Provider, MD  metoprolol succinate (TOPROL-XL) 25 MG 24 hr tablet Take 0.5 tablets (12.5 mg total) by mouth 2 (two) times daily. 10/30/14   Azalee Course, PA  polyethylene glycol (MIRALAX / GLYCOLAX) packet Take 17 g by mouth every other day.     Historical Provider, MD   BP 164/149 mmHg  Pulse 159  Temp(Src) 97.5 F (36.4 C) (Oral)  Resp 18  Ht 5\' 6"  (1.676 m)  Wt 148 lb (67.132 kg)  BMI 23.90 kg/m2  SpO2 97% Physical Exam  Constitutional: She is oriented to person, place, and time. She appears well-developed and well-nourished.  HENT:  Head: Normocephalic and atraumatic.  Eyes: EOM are normal. Pupils are equal, round, and reactive to light.  Neck: Neck supple.  Cardiovascular: Regular rhythm and intact distal pulses.   Patient has a regular heart rate tachycardia.  Pulmonary/Chest: Effort normal and breath sounds normal.  Abdominal: Soft. Bowel sounds are normal. She exhibits no distension. There is no tenderness.  Musculoskeletal: Normal range of motion. She exhibits no edema.  Neurological: She is alert and oriented to person, place, and time. She has normal strength. Coordination normal. GCS eye subscore is 4. GCS verbal subscore is 5. GCS motor subscore is 6.  Skin: Skin is warm, dry and intact.  Psychiatric: She has a normal mood and affect.    ED Course  Procedures (including critical care time) Labs Review Labs Reviewed  TROPONIN I  TSH    Imaging Review No results found. I have personally reviewed and evaluated these images and lab results as part of my medical decision-making.   EKG Interpretation   Date/Time:  Saturday October 30 2014 19:10:41 EDT Ventricular Rate:  167 PR Interval:    QRS Duration: 78 QT Interval:  220 QTC Calculation: 367 R Axis:   44 Text Interpretation:  Supraventricular tachycardia Marked ST abnormality,  possible  inferior subendocardial injury Abnormal ECG Confirmed by  Donnald Garre, MD, Lebron Conners [phone]) on 10/30/2014 7:33:03 PM     CRITICAL CARE Performed by: Arby Barrette   Total critical care time: 30  Critical care time was exclusive of separately billable procedures and treating other patients.  Critical care was necessary to treat or prevent imminent or life-threatening deterioration.  Critical care was time spent personally by me on the following activities: development of treatment plan with patient and/or surrogate as well as nursing, discussions with consultants, evaluation of patient's response to treatment, examination of patient, obtaining history from patient or surrogate, ordering and performing treatments and interventions, ordering and review of laboratory studies, ordering and review of radiographic studies, pulse  oximetry and re-evaluation of patient's condition.    Consult: Dr. Leo Grosser he has evaluated the patient in the emergency department for admission. MDM   Diagnosis: Recurrent SVT.  Patient had been treated earlier in emergency department for SVT. She was subsequently discharged after cardiology consultation with planned outpatient follow-up. She had recurrence of SVT shortly after her discharge. She presents without chest pain, some dyspnea and clear mental status. The patient general appearance is very well. She was treated in the emergency department with another dose of 6 mg of adenosine. She responded to this with conversion to sinus rhythm without complication. The patient is admitted to cardiology by Dr. Leo Grosser with multiple recurrent episodes of SVT.    Arby Barrette, MD 11/02/14 [phone]

## 2014-10-31 DIAGNOSIS — R0602 Shortness of breath: Secondary | ICD-10-CM

## 2014-10-31 DIAGNOSIS — I471 Supraventricular tachycardia: Principal | ICD-10-CM

## 2014-10-31 LAB — BASIC METABOLIC PANEL
ANION GAP: 7 (ref 5–15)
BUN: 9 mg/dL (ref 6–20)
CALCIUM: 9 mg/dL (ref 8.9–10.3)
CO2: 26 mmol/L (ref 22–32)
Chloride: 109 mmol/L (ref 101–111)
Creatinine, Ser: 0.68 mg/dL (ref 0.44–1.00)
GLUCOSE: 98 mg/dL (ref 65–99)
Potassium: 3.9 mmol/L (ref 3.5–5.1)
SODIUM: 142 mmol/L (ref 135–145)

## 2014-10-31 LAB — CBC
HCT: 39.9 % (ref 36.0–46.0)
HEMOGLOBIN: 12.8 g/dL (ref 12.0–15.0)
MCH: 30.1 pg (ref 26.0–34.0)
MCHC: 32.1 g/dL (ref 30.0–36.0)
MCV: 93.9 fL (ref 78.0–100.0)
Platelets: 218 10*3/uL (ref 150–400)
RBC: 4.25 MIL/uL (ref 3.87–5.11)
RDW: 14.5 % (ref 11.5–15.5)
WBC: 5.6 10*3/uL (ref 4.0–10.5)

## 2014-10-31 LAB — PROTIME-INR
INR: 0.96 (ref 0.00–1.49)
PROTHROMBIN TIME: 13 s (ref 11.6–15.2)

## 2014-10-31 LAB — T4, FREE: Free T4: 0.83 ng/dL (ref 0.61–1.12)

## 2014-10-31 LAB — TSH: TSH: 3.943 u[IU]/mL (ref 0.350–4.500)

## 2014-10-31 MED ORDER — SODIUM CHLORIDE 0.9 % IV SOLN
INTRAVENOUS | Status: DC
  Administered 2014-10-31: 10 mL/h via INTRAVENOUS

## 2014-10-31 MED ORDER — DEXTROSE 5 % IV SOLN
5.0000 mg/h | INTRAVENOUS | Status: DC
  Administered 2014-10-31: 5 mg/h via INTRAVENOUS
  Filled 2014-10-31 (×3): qty 100

## 2014-10-31 MED ORDER — METOPROLOL TARTRATE 1 MG/ML IV SOLN
2.5000 mg | Freq: Once | INTRAVENOUS | Status: AC
  Administered 2014-10-31: 2.5 mg via INTRAVENOUS
  Filled 2014-10-31: qty 5

## 2014-10-31 NOTE — Progress Notes (Signed)
Utilization Review Completed.Ann Rodriguez T8/21/2016  

## 2014-10-31 NOTE — Consult Note (Signed)
ELECTROPHYSIOLOGY CONSULT NOTE    Primary Care Physician: Sissy Hoff, MD Referring Physician:  Dr Terri Piedra Date: 10/30/2014  Reason for consultation:  SVT  Ann Rodriguez is a 81 y.o. female with a h/o recurrent symptomatic SVT who presents with tachypalpitatons.  She is well known to Dr Katrinka Blazing.  She has a h/o afib (though I do not have documentation of this).  More recently, she has had documented short RP SVT which is adenosine sensitive.  She has failed medical therapy with flecainide and metoprolol.  She has stable chronic SOB.  She denies exertional CP.  She has PFTs and a myoview ordered electively.  Today, she denies symptoms of  orthopnea, PND, lower extremity edema, dizziness, presyncope, syncope, or neurologic sequela. The patient is tolerating medications without difficulties and is otherwise without complaint today.   Past Medical History  Diagnosis Date  . GERD (gastroesophageal reflux disease)   . Arthritis   . A-fib   . Hyperlipidemia   . Depression     PT'S HUSBAND AND SON HAVE DIED W/IN PAST YEAR 07/22/2011  . Thyroid disease   . SVT (supraventricular tachycardia) 09/2014   Past Surgical History  Procedure Laterality Date  . Bunionectomy      left and right with hammer toe repair  . Appendectomy    . Oophorectomy    . Rotator cuff repair  1/13,  3/13    x 3  right/  . Foot surgery    . Salpingectomy    . Bladder surgery    . Nasal sinus surgery    . Knee arthroscopy  02/09/2011    Procedure: ARTHROSCOPY KNEE;  Surgeon: Jacki Cones;  Location: [facility];  Service: Orthopedics;  Laterality: Left;  Left Knee Arthroscopy with Menisectomy medial, abrasion chondroplasty medial, and synovectomy, supra patella pouch.  . Back surgery      lumbar  . Eye surgery      bilateral cataract extraction with IOL  . Total knee arthroplasty  09/05/2011    Procedure: TOTAL KNEE ARTHROPLASTY;  Surgeon: Jacki Cones, MD;  Location: [facility];  Service: Orthopedics;   Laterality: Left;  . Shoulder open rotator cuff repair Left 06/26/2012    Procedure: LEFT SHOULDER ROTATOR CUFF REPAIR WITH ANCHORS ;  Surgeon: Jacki Cones, MD;  Location: [facility];  Service: Orthopedics;  Laterality: Left;    . adenosine (ADENOCARD) IV  12 mg Intravenous Once  . aspirin EC  81 mg Oral Daily  . atorvastatin  40 mg Oral QPM  . pantoprazole  40 mg Oral Daily  . polyethylene glycol  17 g Oral QODAY   . diltiazem (CARDIZEM) infusion Stopped (10/31/14 0600)    No Known Allergies  Social History   Social History  . Marital Status: Widowed    Spouse Name: N/A  . Number of Children: N/A  . Years of Education: N/A   Occupational History  . Not on file.   Social History Main Topics  . Smoking status: Former Smoker -- 0.00 packs/day for 0 years    Types: Cigarettes    Quit date: 02/04/1989  . Smokeless tobacco: Never Used  . Alcohol Use: No     Comment: socially  . Drug Use: No  . Sexual Activity: Not on file   Other Topics Concern  . Not on file   Social History Narrative    Family History  Problem Relation Age of Onset  . Diabetes Mother   . Diabetes Son  ROS- All systems are reviewed and negative except as per the HPI above  Physical Exam: Telemetry: Filed Vitals:   10/31/14 0259 10/31/14 0427 10/31/14 0502 10/31/14 0535  BP: 121/52 113/78 105/71 112/63  Pulse: 58 142 137 54  Temp: 97.9 F (36.6 C)   98 F (36.7 C)  TempSrc: Oral   Oral  Resp: 14   14  Height:      Weight:      SpO2: 97%   98%    GEN- The patient is well appearing, alert and oriented x 3 today.   Head- normocephalic, atraumatic Eyes-  Sclera clear, conjunctiva pink Ears- hearing intact Oropharynx- clear Neck- supple, no JVP Lymph- no cervical lymphadenopathy Lungs- Clear to ausculation bilaterally, normal work of breathing Heart- Regular rate and rhythm, no murmurs, rubs or gallops, PMI not laterally displaced GI- soft, NT, ND, + BS Extremities- no clubbing,  cyanosis, or edema MS- no significant deformity or atrophy Skin- no rash or lesion Psych- euthymic mood, full affect Neuro- strength and sensation are intact  EKG documents short RP SVT  Labs:   Lab Results  Component Value Date   WBC 5.6 10/31/2014   HGB 12.8 10/31/2014   HCT 39.9 10/31/2014   MCV 93.9 10/31/2014   PLT 218 10/31/2014    Recent Labs Lab 10/31/14 0500  NA 142  K 3.9  CL 109  CO2 26  BUN 9  CREATININE 0.68  CALCIUM 9.0  GLUCOSE 98   Lab Results  Component Value Date   CKTOTAL 81 07/24/2011   CKMB 4.2* 07/24/2011   TROPONINI 0.03 10/30/2014   No results found for: CHOL No results found for: HDL No results found for: LDLCALC No results found for: TRIG No results found for: CHOLHDL No results found for: LDLDIRECT    ASSESSMENT AND PLAN:   1. SVT Documented adenosine sensitive SVT (short RP) Therapeutic strategies for supraventricular tachycardia including medicine and ablation were discussed in detail with the patient today. Risk, benefits, and alternatives to EP study and radiofrequency ablation were also discussed in detail today. These risks include but are not limited to stroke, bleeding, vascular damage, tamponade, perforation, damage to the heart and other structures, AV block requiring pacemaker, worsening renal function, and death. The patient understands these risk and wishes to proceed.  We will tentatively place on the schedule for tomorrow afternoon.  2. SOB Chronic Would hold off on stress testing until after her ablation My suspicion for CAD is low  Drs Ladona Ridgel and Elberta Fortis to see in am   Hillis Range, MD 10/31/2014  10:04 AM

## 2014-10-31 NOTE — Progress Notes (Signed)
M.D notifies of sustained SVT of 137 new orders given.

## 2014-10-31 NOTE — Progress Notes (Signed)
Patients blood pressure 112/63 and pulse down to 54. Cardizem drip stopped at this time will continue to monitor.

## 2014-11-01 ENCOUNTER — Ambulatory Visit (HOSPITAL_COMMUNITY): Payer: Medicare Other

## 2014-11-01 ENCOUNTER — Encounter (HOSPITAL_COMMUNITY): Admission: EM | Disposition: A | Discharge status: Home / Self Care | Payer: Self-pay | Source: Home / Self Care

## 2014-11-01 ENCOUNTER — Encounter (HOSPITAL_COMMUNITY): Payer: Self-pay

## 2014-11-01 DIAGNOSIS — G451 Carotid artery syndrome (hemispheric): Secondary | ICD-10-CM

## 2014-11-01 LAB — CBC
HEMATOCRIT: 41.1 % (ref 36.0–46.0)
HEMOGLOBIN: 13.6 g/dL (ref 12.0–15.0)
MCH: 31 pg (ref 26.0–34.0)
MCHC: 33.1 g/dL (ref 30.0–36.0)
MCV: 93.6 fL (ref 78.0–100.0)
Platelets: 243 10*3/uL (ref 150–400)
RBC: 4.39 MIL/uL (ref 3.87–5.11)
RDW: 14.4 % (ref 11.5–15.5)
WBC: 7.5 10*3/uL (ref 4.0–10.5)

## 2014-11-01 LAB — BASIC METABOLIC PANEL
Anion gap: 7 (ref 5–15)
BUN: 14 mg/dL (ref 6–20)
CHLORIDE: 106 mmol/L (ref 101–111)
CO2: 28 mmol/L (ref 22–32)
Calcium: 9 mg/dL (ref 8.9–10.3)
Creatinine, Ser: 0.7 mg/dL (ref 0.44–1.00)
GFR calc Af Amer: >60 mL/min (ref >60)
GFR calc non Af Amer: >60 mL/min (ref >60)
GLUCOSE: 115 mg/dL — AB (ref 65–99)
POTASSIUM: 4 mmol/L (ref 3.5–5.1)
Sodium: 141 mmol/L (ref 135–145)

## 2014-11-01 SURGERY — INVASIVE LAB ABORTED CASE

## 2014-11-01 MED ORDER — DILTIAZEM HCL 100 MG IV SOLR
5.0000 mg/h | INTRAVENOUS | Status: DC
  Administered 2014-11-01: 5 mg/h via INTRAVENOUS
  Filled 2014-11-01 (×2): qty 100

## 2014-11-01 MED ORDER — AMIODARONE HCL IN DEXTROSE 360-4.14 MG/200ML-% IV SOLN
30.0000 mg/h | INTRAVENOUS | Status: AC
  Administered 2014-11-02 (×2): 30 mg/h via INTRAVENOUS
  Filled 2014-11-01: qty 200

## 2014-11-01 MED ORDER — AMIODARONE LOAD VIA INFUSION
150.0000 mg | Freq: Once | INTRAVENOUS | Status: AC
  Administered 2014-11-01: 150 mg via INTRAVENOUS

## 2014-11-01 MED ORDER — ADENOSINE 6 MG/2ML IV SOLN
6.0000 mg | Freq: Once | INTRAVENOUS | Status: AC
  Administered 2014-11-01: 6 mg via INTRAVENOUS

## 2014-11-01 MED ORDER — FENTANYL CITRATE (PF) 100 MCG/2ML IJ SOLN
INTRAMUSCULAR | Status: AC
  Filled 2014-11-01: qty 4

## 2014-11-01 MED ORDER — APIXABAN 2.5 MG PO TABS
2.5000 mg | ORAL_TABLET | Freq: Two times a day (BID) | ORAL | Status: DC
  Administered 2014-11-01 – 2014-11-04 (×5): 2.5 mg via ORAL
  Filled 2014-11-01 (×5): qty 1

## 2014-11-01 MED ORDER — FLECAINIDE ACETATE 100 MG PO TABS
100.0000 mg | ORAL_TABLET | Freq: Once | ORAL | Status: AC
  Administered 2014-11-01: 100 mg via ORAL
  Filled 2014-11-01: qty 1

## 2014-11-01 MED ORDER — AMIODARONE HCL 200 MG PO TABS: 200.0000 mg | ORAL_TABLET | Freq: Two times a day (BID) | ORAL | Status: DC

## 2014-11-01 MED ORDER — BUPIVACAINE HCL (PF) 0.25 % IJ SOLN
INTRAMUSCULAR | Status: AC
  Filled 2014-11-01: qty 60

## 2014-11-01 MED ORDER — AMIODARONE HCL IN DEXTROSE 360-4.14 MG/200ML-% IV SOLN
INTRAVENOUS | Status: AC
  Filled 2014-11-01: qty 200

## 2014-11-01 MED ORDER — STROKE: EARLY STAGES OF RECOVERY BOOK
Freq: Once | Status: AC
  Administered 2014-11-01: 22:00:00
  Filled 2014-11-01: qty 1

## 2014-11-01 MED ORDER — MIDAZOLAM HCL 5 MG/5ML IJ SOLN
INTRAMUSCULAR | Status: AC
  Filled 2014-11-01: qty 25

## 2014-11-01 MED ORDER — AMIODARONE HCL IN DEXTROSE 360-4.14 MG/200ML-% IV SOLN
60.0000 mg/h | INTRAVENOUS | Status: AC
  Administered 2014-11-01 – 2014-11-02 (×2): 60 mg/h via INTRAVENOUS
  Filled 2014-11-01: qty 200

## 2014-11-01 SURGICAL SUPPLY — 6 items
BAG SNAP BAND KOVER 36X36 (MISCELLANEOUS) IMPLANT
PACK EP LATEX FREE (CUSTOM PROCEDURE TRAY)
PACK EP LF (CUSTOM PROCEDURE TRAY) IMPLANT
SHEATH PINNACLE 6F 10CM (SHEATH) IMPLANT
SHEATH PINNACLE 8F 10CM (SHEATH) IMPLANT
SHIELD RADPAD SCOOP 12X17 (MISCELLANEOUS) IMPLANT

## 2014-11-01 NOTE — Progress Notes (Signed)
Patient ID: Ann Rodriguez, female   DOB: 05-Apr-1928, 81 y.o.   MRN: [id number]  Patient had recurrence of short RP SVT tonight.  She was symptomatic with HR around 160 bpm.  She had not yet received her first dose of amiodarone.  She converted to NSR with 6 mg IV adenosine.  I will start her on amiodarone infusion initially then convert over to po later.  Marca Ancona 11/01/2014 10:03 PM

## 2014-11-01 NOTE — Progress Notes (Signed)
Pt back to room as procedure was cancelled. Telemetry reapplied and verified; VSS. Will continue to monitor pt closely. Arabella Merles Opal Mckellips RN.

## 2014-11-01 NOTE — Progress Notes (Signed)
CTSP secondary to going back into SVT at 160bpm.  Given Lopressor 2.5mg  IV with no response after 30 minutes.  No carotid bruit noted on exam.  Carotid massage performed with no response.  Given  IV Adenosine with conversion back to NSR in the 80's.  SBP .  Will restart IV Cardizem with no bolus at /hr.  Plan for ablation in am.

## 2014-11-01 NOTE — Progress Notes (Signed)
Pt transported off unit to cath lab. P. Amo Doreather Hoxworth RN  

## 2014-11-01 NOTE — Progress Notes (Signed)
Pt son calls RN to room concerning pt having difficulty with speech and right eye blurry. Pt denies any blurry vision upon assessment, pt state "I was having difficulty finding words, I don't know if it's panic attack but I just don't feel right". Neuro check wnl, Vitals taken, rapid response RN notified and at pt bedside. PA Hager notified. Arabella Merles Xandria Gallaga RN.

## 2014-11-01 NOTE — Progress Notes (Signed)
I was called by the RN because the patient developed right hemianopsia and word finding difficulties.  Rapid response was called as well.  When I arrived, the symptoms had resolved.   The patient said she could not see the right side of the clock.  Family reported she had word finding difficulties.   CN II-XII appear in tacked.  Card: RRR, lungs CTA.  Neuro consulted.    Soul Hackman, PAC

## 2014-11-01 NOTE — H&P (Signed)
     Patient Name: Ann Rodriguez Date of Encounter: 11/01/2014     Active Problems:   SVT (supraventricular tachycardia)    SUBJECTIVE  C/o palpitations and sob.   CURRENT MEDS . aspirin EC  81 mg Oral Daily  . atorvastatin  40 mg Oral QPM  . pantoprazole  40 mg Oral Daily  . polyethylene glycol  17 g Oral QODAY    OBJECTIVE  Filed Vitals:   11/01/14 0250 11/01/14 0400 11/01/14 1100 11/01/14 1449  BP: 114/87 91/55 106/70 106/70  Pulse: 161 161 62 60  Temp:  97.5 F (36.4 C) 98.1 F (36.7 C) 98.1 F (36.7 C)  TempSrc:  Oral Oral Oral  Resp:  Height:      Weight:      SpO2:  96% 100% 98%    Intake/Output Summary (Last 24 hours) at 11/01/14 1503 Last data filed at 11/01/14 1230  Gross per 24 hour  Intake      0 ml  Output    400 ml  Net   -400 ml   Filed Weights   10/30/14 1910  Weight: 148 lb (67.132 kg)    PHYSICAL EXAM  General: Pleasant, NAD. Neuro: Alert and oriented X 3. Moves all extremities spontaneously. Psych: Normal affect. HEENT:  Normal  Neck: Supple without bruits or JVD. Lungs:  Resp regular and unlabored, CTA. Heart: Reg tachy s3, s4, or murmurs. Abdomen: Soft, non-tender, non-distended, BS + x 4.  Extremities: No clubbing, cyanosis or edema. DP/PT/Radials 2+ and equal bilaterally.  Accessory Clinical Findings  CBC  Recent Labs  10/30/14 0407 10/31/14 0500 11/01/14 0336  WBC 8.3 5.6 7.5  NEUTROABS 5.1  --   --   HGB 14.8 12.8 13.6  HCT 44.1 39.9 41.1  MCV 94.0 93.9 93.6  PLT 279 218 243   Basic Metabolic Panel  Recent Labs  10/31/14 0500 11/01/14 0336  NA 142 141  K 3.9 4.0  CL 109 106  CO2 26 28  GLUCOSE 98 115*  BUN 9 14  CREATININE 0.68 0.70  CALCIUM 9.0 9.0   Liver Function Tests No results for input(s): AST, ALT, ALKPHOS, BILITOT, PROT, ALBUMIN in the last 72 hours. No results for input(s): LIPASE, AMYLASE in the last 72 hours. Cardiac Enzymes  Recent Labs  10/30/14 1922  TROPONINI 0.03     BNP Invalid input(s): POCBNP D-Dimer No results for input(s): DDIMER in the last 72 hours. Hemoglobin A1C No results for input(s): HGBA1C in the last 72 hours. Fasting Lipid Panel No results for input(s): CHOL, HDL, LDLCALC, TRIG, CHOLHDL, LDLDIRECT in the last 72 hours. Thyroid Function Tests  Recent Labs  10/31/14 0500  TSH 3.943    TELE SVT at 150/min.   Radiology/Studies  No results found.  ASSESSMENT AND PLAN  1. Incessant SVT I have discussed the risks/benefits/goals/expectations of cathteter ablation with the patient and she wishes to proceed.  Vic Esco,M.D.  11/01/2014 3:03 PM

## 2014-11-01 NOTE — H&P (Signed)
  EP Attending  Patient seen and examined. She has had a TIA today which resulted in Right HP, dysphasia, and some confusion. She has resolved. She was not anti-coagulated. Will start Eliquis and Amiodarone.  I would not recommend a cathter ablation at this time for fear of causing a stroke. Will start amiodarone. At 19, she might be best off with amio and Eliquis rather than an ablation. I have discussed with the patient. She is frustrated but understands why we are not proceeding with ablation.  Ann Rodriguez.D.

## 2014-11-01 NOTE — Progress Notes (Addendum)
Patient remains in SVT this morning after multiple interventions this shift.  Dr. Mayford Knife notified at ~0015 that the patient's heart rate had increased to 160's.  Patient initially given 2.5mg  of Lopressor IV without response. Patient then given Adenosine (Dr. Mayford Knife at patient's bedside) with HR returning to NSR in the 70's but increasing again to 150's-160's.  Patient briefly started on Cardizem gtt at 5 mg/hr but this was stopped after about an hour because the patient's BP began to drop with no affect on HR.  Dr. Mayford Knife then notified Dr. Johney Frame who recommended Flecainide 100 mg PO which was given.  Patient's HR only decreased slightly to the 140's.  The patient remains asymptomatic.  Her only complaint is that she was unable to sleep with her heart racing.

## 2014-11-01 NOTE — Progress Notes (Signed)
Patient was a no show for a Lexiscan Cardiolite study today due to the patient is in the hospital. Notified [employer] [employer]/gallery exhibitions officer) that the patient was scheduled for a nuclear study today. Rosann Auerbach will follow up on rescheduling the patient. Irean Hong, RN.

## 2014-11-01 NOTE — Consult Note (Signed)
Referring Physician: Ladona Ridgel    Chief Complaint: TIA  HPI:                                                                                                                                         Ann Rodriguez is an 81 y.o. female with history of Afib who presented to the hospital due to recurrent SVT. She has failed medical therapy with flecainide and metoprolol and is currently in hospital for a ablation.  While in hospital patient had a 10 minute episode which she could not see a small portion of her left visual field but not in a dense field cut or quadrant aspect.  "just a spot in her vision".  She did close each eye and it was present in both eyes. It was also noted she had difficulty finishing her sentences due to word finding difficulty. Currently all symptoms have resolved and cardiology would like a consult prior to ablation.   Date last known well: Date: 11/01/2014 Time last known well: Time: 15:58 tPA Given: No: symptoms resolved.      Past Medical History  Diagnosis Date  . GERD (gastroesophageal reflux disease)   . Arthritis   . A-fib   . Hyperlipidemia   . Depression     PT'S HUSBAND AND SON HAVE DIED W/IN PAST YEAR 2011-08-08  . Thyroid disease   . SVT (supraventricular tachycardia) 09/2014    Past Surgical History  Procedure Laterality Date  . Bunionectomy      left and right with hammer toe repair  . Appendectomy    . Oophorectomy    . Rotator cuff repair  1/13,  3/13    x 3  right/  . Foot surgery    . Salpingectomy    . Bladder surgery    . Nasal sinus surgery    . Knee arthroscopy  02/09/2011    Procedure: ARTHROSCOPY KNEE;  Surgeon: Jacki Cones;  Location: [facility];  Service: Orthopedics;  Laterality: Left;  Left Knee Arthroscopy with Menisectomy medial, abrasion chondroplasty medial, and synovectomy, supra patella pouch.  . Back surgery      lumbar  . Eye surgery      bilateral cataract extraction with IOL  . Total knee arthroplasty  09/05/2011     Procedure: TOTAL KNEE ARTHROPLASTY;  Surgeon: Jacki Cones, MD;  Location: [facility];  Service: Orthopedics;  Laterality: Left;  . Shoulder open rotator cuff repair Left 06/26/2012    Procedure: LEFT SHOULDER ROTATOR CUFF REPAIR WITH ANCHORS ;  Surgeon: Jacki Cones, MD;  Location: [facility];  Service: Orthopedics;  Laterality: Left;    Family History  Problem Relation Age of Onset  . Diabetes Mother   . Diabetes Son    Social History:  reports that she quit smoking about 25 years ago. Her smoking use included Cigarettes. She smoked 0.00 packs per day for  0 years. She has never used smokeless tobacco. She reports that she does not drink alcohol or use illicit drugs.  Allergies: No Known Allergies  Medications:                                                                                                                           Prior to Admission:  Prescriptions prior to admission  Medication Sig Dispense Refill Last Dose  . aspirin EC 81 MG tablet Take 81 mg by mouth daily.   10/30/2014 at Unknown time  . atorvastatin (LIPITOR) 40 MG tablet Take 40 mg by mouth every evening.    10/30/2014 at Unknown time  . beta carotene w/minerals (OCUVITE) tablet Take 1 tablet by mouth daily with breakfast.    10/30/2014 at Unknown time  . BIOTIN PO Take 1 tablet by mouth daily.   10/30/2014 at Unknown time  . calcium carbonate (OS-CAL) 600 MG TABS Take 1,800 mg by mouth daily with breakfast.    10/30/2014 at Unknown time  . cholecalciferol (VITAMIN D) 1000 UNITS tablet Take 1,000 Units by mouth 2 (two) times daily.    10/30/2014 at Unknown time  . esomeprazole (NEXIUM) 20 MG capsule Take 20 mg by mouth every morning.    10/30/2014 at Unknown time  . polyethylene glycol (MIRALAX / GLYCOLAX) packet Take 17 g by mouth every other day.    10/29/2014 at Unknown time   Scheduled: . aspirin EC  81 mg Oral Daily  . atorvastatin  40 mg Oral QPM  . pantoprazole  40 mg Oral Daily  . polyethylene glycol  17 g Oral  QODAY    ROS:                                                                                                                                       History obtained from the patient  General ROS: negative for - chills, fatigue, fever, night sweats, weight gain or weight loss Psychological ROS: negative for - behavioral disorder, hallucinations, memory difficulties, mood swings or suicidal ideation Ophthalmic ROS: negative for - blurry vision, double vision, eye pain or loss of vision ENT ROS: negative for - epistaxis, nasal discharge, oral lesions, sore throat, tinnitus or vertigo Allergy and Immunology ROS: negative for - hives or itchy/watery eyes Hematological and Lymphatic ROS: negative for - bleeding problems, bruising or swollen lymph nodes Endocrine  ROS: negative for - galactorrhea, hair pattern changes, polydipsia/polyuria or temperature intolerance Respiratory ROS: negative for - cough, hemoptysis, shortness of breath or wheezing Cardiovascular ROS: negative for - chest pain, dyspnea on exertion, edema or irregular heartbeat Gastrointestinal ROS: negative for - abdominal pain, diarrhea, hematemesis, nausea/vomiting or stool incontinence Genito-Urinary ROS: negative for - dysuria, hematuria, incontinence or urinary frequency/urgency Musculoskeletal ROS: negative for - joint swelling or muscular weakness Neurological ROS: as noted in HPI Dermatological ROS: negative for rash and skin lesion changes  Neurologic Examination:                                                                                                      Blood pressure 141/71, pulse 63, temperature 97.9 F (36.6 C), temperature source Oral, resp. rate 20, height  (1.676 m), weight 67.132 kg (148 lb), SpO2 100 %.  HEENT-  Normocephalic, no lesions, without obvious abnormality.  Normal external eye and conjunctiva.  Normal TM's bilaterally.  Normal auditory canals and external ears. Normal external nose, mucus  membranes and septum.  Normal pharynx. Cardiovascular- S1, S2 normal, pulses palpable throughout   Lungs- chest clear, no wheezing, rales, normal symmetric air entry, Heart exam - S1, S2 normal, no murmur, no gallop, rate regular Abdomen- normal findings: bowel sounds normal Extremities- no edema Lymph-no adenopathy palpable Musculoskeletal-no joint tenderness, deformity or swelling Skin-warm and dry, no hyperpigmentation, vitiligo, or suspicious lesions  Neurological Examination Mental Status: Alert, oriented, thought content appropriate.  Speech fluent without evidence of aphasia.  Able to follow 3 step commands without difficulty. Cranial Nerves: II: Discs flat bilaterally; Visual fields grossly normal, pupils equal, round, reactive to light and accommodation III,IV, VI: ptosis not present, extra-ocular motions intact bilaterally V,VII: smile symmetric, facial light touch sensation normal bilaterally VIII: hearing normal bilaterally IX,X: uvula rises symmetrically XI: bilateral shoulder shrug XII: midline tongue extension Motor: Right : Upper extremity   5/5    Left:     Upper extremity   5/5  Lower extremity   5/5     Lower extremity   5/5 Tone and bulk:normal tone throughout; no atrophy noted Sensory: Pinprick and light touch intact throughout, bilaterally Deep Tendon Reflexes: 2+ and symmetric throughout UE 1+ bilateral KJ no AJ Plantars: Right: downgoing   Left: downgoing Cerebellar: normal finger-to-nose  and normal heel-to-shin test Gait: not tested due to multiple leads.        Lab Results: Basic Metabolic Panel:  Recent Labs Lab 10/30/14 0407 10/31/14 0500 11/01/14 0336  NA 141 142 141  K 4.1 3.9 4.0  CL 108 109 106  CO2 GLUCOSE 112* 98 115*  BUN CREATININE 0.74 0.68 0.70  CALCIUM 9.7 9.0 9.0    Liver Function Tests: No results for input(s): AST, ALT, ALKPHOS, BILITOT, PROT, ALBUMIN in the last 168 hours. No results for input(s):  LIPASE, AMYLASE in the last 168 hours. No results for input(s): AMMONIA in the last 168 hours.  CBC:  Recent Labs Lab 10/30/14 0407 10/31/14 0500 11/01/14 0336  WBC 8.3  5.6 7.5  NEUTROABS 5.1  --   --   HGB 14.8 12.8 13.6  HCT 44.1 39.9 41.1  MCV 94.0 93.9 93.6  PLT 279 218 243    Cardiac Enzymes:  Recent Labs Lab 10/30/14 1922  TROPONINI 0.03    Lipid Panel: No results for input(s): CHOL, TRIG, HDL, CHOLHDL, VLDL, LDLCALC in the last 168 hours.  CBG: No results for input(s): GLUCAP in the last 168 hours.  Microbiology: Results for orders placed or performed during the hospital encounter of 06/18/12  Surgical pcr screen     Status: None   Collection Time: 06/18/12  9:14 AM  Result Value Ref Range Status   MRSA, PCR NEGATIVE NEGATIVE Final   Staphylococcus aureus NEGATIVE NEGATIVE Final    Comment:        The Xpert SA Assay (FDA approved for NASAL specimens in patients over 57 years of age), is one component of a comprehensive surveillance program.  Test performance has been validated by [employer] for patients greater than or equal to 52 year old. It is not intended to diagnose infection nor to guide or monitor treatment.  Urine culture     Status: None   Collection Time: 06/18/12 10:00 AM  Result Value Ref Range Status   Specimen Description URINE, CLEAN CATCH  Final   Special Requests NONE  Final   Culture  Setup Time 06/18/2012 13:37  Final   Colony Count NO GROWTH  Final   Culture NO GROWTH  Final   Report Status 06/19/2012 FINAL  Final    Coagulation Studies:  Recent Labs  10/31/14 0500  LABPROT 13.0  INR 0.96    Imaging: No results found.     Assessment and plan discussed with with attending physician and they are in agreement.    Felicie Morn PA-C [facility] [phone]  11/01/2014, 4:12 PM   Assessment: 80 y.o. female with history of A fib presenting to the hospital with recurrent SVT. Admitted for scheduled  ablation. While in hospital had 10 minute episode of questionable TIA like symptoms. Currently asymptomatic. Scheduled ablation was held due to concern over possible TIA symptoms. Instead she was started on Eliquis and amiodarone.   Stroke Risk Factors - atrial fibrillation and hyperlipidemia  1. HgbA1c, fasting lipid panel 2. MRI, MRA  of the brain without contrast 3. Frequent neuro checks 4. Echocardiogram 5. Carotid dopplers 6. Prophylactic therapy-Eliquis 7. Risk factor modification 8. Telemetry monitoring 9. PT consult, OT consult, Speech consult 10. NPO until RN stroke swallow screen   Ann Cho, DO [facility] [phone]  If 7pm- 7am, please page neurology on call as listed in AMION.

## 2014-11-01 NOTE — Progress Notes (Signed)
Patient with difficulty finding words and difficulty seeing to the right.  She describes that she could only see the beginning of the word on the wall and the left side of the clock, when she looked at her son who was sitting to her right she could not see him very well.  These symptoms lasted about 10 min per patient and family.  When I assessed her the NIHSS was 0.  She had fluent speech and no trouble seeing.  She states that she has never felt that way before.  Patient and family educated to alert nursing staff if symptoms reoccur.  Joesph Fillers PA notified.  Plan: call neuro consult.

## 2014-11-01 NOTE — Progress Notes (Signed)
Patient went back into SVT at 140bpm about an hour after Adenosine given.  Patient is completely asymptomatic and sleeping.  Discussed with Dr. Johney Frame.  Recommended giving Flecainide  now.  If no response then no other meds at this time as patient is tolerating rhythm and is to have ablation today.

## 2014-11-02 ENCOUNTER — Inpatient Hospital Stay (HOSPITAL_COMMUNITY): Payer: Medicare Other

## 2014-11-02 DIAGNOSIS — G459 Transient cerebral ischemic attack, unspecified: Secondary | ICD-10-CM

## 2014-11-02 LAB — BASIC METABOLIC PANEL
Anion gap: 4 — ABNORMAL LOW (ref 5–15)
BUN: 12 mg/dL (ref 6–20)
CO2: 29 mmol/L (ref 22–32)
CREATININE: 0.75 mg/dL (ref 0.44–1.00)
Calcium: 8.6 mg/dL — ABNORMAL LOW (ref 8.9–10.3)
Chloride: 107 mmol/L (ref 101–111)
GFR calc Af Amer: >60 mL/min (ref >60)
GLUCOSE: 91 mg/dL (ref 65–99)
Potassium: 4 mmol/L (ref 3.5–5.1)
SODIUM: 140 mmol/L (ref 135–145)

## 2014-11-02 LAB — CBC
HEMATOCRIT: 40.3 % (ref 36.0–46.0)
Hemoglobin: 13.3 g/dL (ref 12.0–15.0)
MCH: 31.1 pg (ref 26.0–34.0)
MCHC: 33 g/dL (ref 30.0–36.0)
MCV: 94.4 fL (ref 78.0–100.0)
PLATELETS: 208 10*3/uL (ref 150–400)
RBC: 4.27 MIL/uL (ref 3.87–5.11)
RDW: 14.2 % (ref 11.5–15.5)
WBC: 6.6 10*3/uL (ref 4.0–10.5)

## 2014-11-02 MED ORDER — SODIUM CHLORIDE 0.9 % IV SOLN
INTRAVENOUS | Status: DC
  Administered 2014-11-03: 06:00:00 via INTRAVENOUS

## 2014-11-02 NOTE — Evaluation (Signed)
Physical Therapy Evaluation Patient Details Name: Ann Rodriguez MRN: [id number] DOB: April 14, 1928 Today's Date: 11/02/2014   History of Present Illness  Pt is an 81 y.o female with a PMH significant for SVT probably AVnRT w/ multiple recent ED visits for symptomatic palpitations. She was d/c'd earlier in the day and returns with the same complaints on 8/20. She had a TIA on 8/22 and has now resolved back to baseline. Catheter ablation questionable for this admission.  Clinical Impression  Patient evaluated by Physical Therapy with no further acute PT needs identified. All education has been completed and the patient has no further questions. At the time of PT eval pt was able to perform transfers and ambulation with no physical assistance. Pt reports she is at baseline of function and has 24 hour support available if needed at d/c. See below for any follow-up Physial Therapy or equipment needs. PT is signing off. Thank you for this referral.     Follow Up Recommendations No PT follow up    Equipment Recommendations  None recommended by PT    Recommendations for Other Services       Precautions / Restrictions Precautions Precautions: Fall Restrictions Weight Bearing Restrictions: No      Mobility  Bed Mobility Overal bed mobility: Independent             General bed mobility comments: No use of bed rails  Transfers Overall transfer level: Modified independent Equipment used: None             General transfer comment: No physical assist, no LOB. Pt able to stand from low toilet in room.   Ambulation/Gait Ambulation/Gait assistance: Modified independent (Device/Increase time) Ambulation Distance (Feet): 600 Feet Assistive device: None Gait Pattern/deviations: WFL(Within Functional Limits)   Gait velocity interpretation: at or above normal speed for age/gender General Gait Details: Occasional side step for balance as pt walked (mainly when pt talking and making large  gestures with arms/hands). No LOB and pt able to recover independently.   Stairs            Wheelchair Mobility    Modified Rankin (Stroke Patients Only) Modified Rankin (Stroke Patients Only) Pre-Morbid Rankin Score: No symptoms Modified Rankin: No symptoms     Balance Overall balance assessment: Needs assistance Sitting-balance support: Feet supported;No upper extremity supported Sitting balance-Leahy Scale: Normal     Standing balance support: No upper extremity supported;During functional activity Standing balance-Leahy Scale: Good                               Pertinent Vitals/Pain Pain Assessment: No/denies pain    Home Living Family/patient expects to be discharged to:: Private residence Living Arrangements: Alone Available Help at Discharge: Family;Available 24 hours/day Type of Home: House Home Access: Stairs to enter Entrance Stairs-Rails: None Entrance Stairs-Number of Steps: 1 Home Layout: One level (1 step down into den.) Home Equipment: Walker - 2 wheels;Cane - single point;Grab bars - tub/shower (Was her husband's - not sure )      Prior Function Level of Independence: Independent         Comments: Pt reports she still sits in the bathtub to wash and is able to pull herself up with a grab bar. Drives, cares for home and does grocery shopping independently.      Hand Dominance   Dominant Hand: Right    Extremity/Trunk Assessment   Upper Extremity Assessment: Overall WFL for tasks  assessed (Has had B shoulder surgeries and AROM is slightly limited. )           Lower Extremity Assessment: Overall WFL for tasks assessed (5/5 strength quads, hams. 4+/5 strength hip flexors)      Cervical / Trunk Assessment: Kyphotic (Slightly)  Communication   Communication: HOH  Cognition Arousal/Alertness: Awake/alert Behavior During Therapy: WFL for tasks assessed/performed;Restless Overall Cognitive Status: Within Functional Limits  for tasks assessed                      General Comments      Exercises        Assessment/Plan    PT Assessment Patent does not need any further PT services  PT Diagnosis Generalized weakness   PT Problem List    PT Treatment Interventions     PT Goals (Current goals can be found in the Care Plan section) Acute Rehab PT Goals PT Goal Formulation: All assessment and education complete, DC therapy    Frequency     Barriers to discharge        Co-evaluation               End of Session Equipment Utilized During Treatment: Gait belt Activity Tolerance: Patient tolerated treatment well Patient left: in chair;with call bell/phone within reach;with family/visitor present Nurse Communication: Mobility status         Time: 1610-9604 PT Time Calculation (min) (ACUTE ONLY): 19 min   Charges:   PT Evaluation $Initial PT Evaluation Tier I: 1 Procedure     PT G CodesConni Slipper 11-06-14, 10:02 AM   Conni Slipper, PT, DPT Acute Rehabilitation Services Pager: [phone]

## 2014-11-02 NOTE — Progress Notes (Signed)
Pt informs RN she has a weird feeling stating " I feel like I will pass out if am to stand up". Pt VSS, pt assisted to stand for standing VS; pt denies any numbness, tingling, blurry vision etc. Neuro check wnl; pt said she feels like it could be Panic attack. NP Amber notified. Pt sitting up in chair eating with friends at beside. Will continue to closely monitor. Arabella Merles Loany Neuroth RN.   11/02/14 1201  Vitals  Temp 97.9 F (36.6 C)  Temp Source Oral  BP 123/74 mmHg  MAP (mmHg) 84  BP Location Right Arm  BP Method Automatic  Patient Position (if appropriate) Sitting  Pulse Rate (!) 59  Pulse Rate Source Dinamap  Resp 18  Oxygen Therapy  SpO2 100 %  O2 Device Room Air

## 2014-11-02 NOTE — Care Management Note (Signed)
Case Management Note Donn Pierini RN, BSN Unit 2W-Case Manager [phone]  Patient Details  Name: Ann Rodriguez MRN: [id number] Date of Birth: 01-07-1929  Subjective/Objective:  Pt admitted with SVT, TIA      Possible ablation            Action/Plan: PTA pt lived at home- independent- anticipate return home  Expected Discharge Date:                  Expected Discharge Plan:  Home/Self Care  In-House Referral:     Discharge planning Services  CM Consult, Medication Assistance  Post Acute Care Choice:    Choice offered to:     DME Arranged:    DME Agency:     HH Arranged:    HH Agency:     Status of Service:  In process, will continue to follow  Medicare Important Message Given:  Yes-second notification given Date Medicare IM Given:    Medicare IM give by:    Date Additional Medicare IM Given:    Additional Medicare Important Message give by:     If discussed at Long Length of Stay Meetings, dates discussed:    Additional Comments:  11/02/14- pt has been started on Eliquis- benefits check completed- Per rep at [employer]:  Eliquis: $80 for 60 day supply retail; $40 for 30 day supply retail; $120 90 day supply  No auth required  Spoke with pt at bedside- and above info shared - pt unsure how long she will need to be on Eliquis- advised her to ask MD- 30 day free card given to pt- per pt she uses [facility] - mail order for medication needs- [facility] locally when she needs to- pt will need script for 30 day free card- and then is she is to stay on Eliquis long term a 90 day script will be needed for her to send in to her mail order pharmacy.    Darrold Span, RN 11/02/2014, 11:40 AM

## 2014-11-02 NOTE — Care Management Important Message (Signed)
Important Message  Patient Details  Name: Ann Rodriguez MRN: [id number] Date of Birth: 1929-01-31   Medicare Important Message Given:  Yes-second notification given    Darrold Span, RN 11/02/2014, 11:39 AM

## 2014-11-02 NOTE — Progress Notes (Signed)
Pt stable during remaining of shift, amiodarone stopped as ordered; pt remains NSR on telemetry during shift. Reported off to oncoming RN. Arabella Merles Tayonna Bacha RN.

## 2014-11-02 NOTE — Progress Notes (Signed)
STROKE TEAM PROGRESS NOTE   HISTORY Ann Rodriguez is an 81 y.o. female with history of Afib who presented to the hospital due to recurrent SVT. She has failed medical therapy with flecainide and metoprolol and is currently in hospital for a ablation. While in hospital patient had a 10 minute episode which she could not see a small portion of her left visual field but not in a dense field cut or quadrant aspect. "just a spot in her vision". She did close each eye and it was present in both eyes. It was also noted she had difficulty finishing her sentences due to word finding difficulty. Currently all symptoms have resolved and cardiology would like a consult prior to ablation.   Date last known well: Date: 11/01/2014 Time last known well: Time: 15:58 tPA Given: No: symptoms resolved.    SUBJECTIVE (INTERVAL HISTORY) Her friend of 60 years is at the bedside.  Overall she feels her condition is stable. Decisions are underway related to planned treatment for SVT. She recounts her recent history with Dr. Pearlean Brownie. She felt her vision was gone centrally with good peripheral vision and was present in both eyes (she checked both sides)   OBJECTIVE Temp:  [97.9 F (36.6 C)-98.9 F (37.2 C)] 98.9 F (37.2 C) (08/23 1019) Pulse Rate:  [49-73] 60 (08/23 1019) Cardiac Rhythm:  [-] Sinus bradycardia (08/23 0717) Resp:  [16-20] 18 (08/23 1019) BP: (100-141)/(45-87) 116/79 mmHg (08/23 1019) SpO2:  [96 %-100 %] 98 % (08/23 1019)  No results for input(s): GLUCAP in the last 168 hours.  Recent Labs Lab 10/30/14 0407 10/31/14 0500 11/01/14 0336 11/02/14 0404  NA 141 142 141 140  K 4.1 3.9 4.0 4.0  CL 108 109 106 107  CO2 25 26 28 29   GLUCOSE 112* 98 115* 91  BUN 16 9 14 12   CREATININE 0.74 0.68 0.70 0.75  CALCIUM 9.7 9.0 9.0 8.6*   No results for input(s): AST, ALT, ALKPHOS, BILITOT, PROT, ALBUMIN in the last 168 hours.  Recent Labs Lab 10/30/14 0407 10/31/14 0500 11/01/14 0336  11/02/14 0404  WBC 8.3 5.6 7.5 6.6  NEUTROABS 5.1  --   --   --   HGB 14.8 12.8 13.6 13.3  HCT 44.1 39.9 41.1 40.3  MCV 94.0 93.9 93.6 94.4  PLT 279 218 243 208    Recent Labs Lab 10/30/14 1922  TROPONINI 0.03    Recent Labs  10/31/14 0500  LABPROT 13.0  INR 0.96   No results for input(s): COLORURINE, LABSPEC, PHURINE, GLUCOSEU, HGBUR, BILIRUBINUR, KETONESUR, PROTEINUR, UROBILINOGEN, NITRITE, LEUKOCYTESUR in the last 72 hours.  Invalid input(s): APPERANCEUR  No results found for: CHOL, TRIG, HDL, CHOLHDL, VLDL, LDLCALC No results found for: HGBA1C No results found for: LABOPIA, COCAINSCRNUR, LABBENZ, AMPHETMU, THCU, LABBARB  No results for input(s): ETH in the last 168 hours.   IMAGING  No results found.  2D ECHOCARDIOGRAM  09/29/2014 - Left ventricle: The cavity size was normal. Systolic function wasnormal. The estimated ejection fraction was in the range of 55%to 60%. Wall motion was normal; there were no regional wallmotion abnormalities. Doppler parameters are consistent withabnormal left ventricular relaxation (grade 1 diastolicdysfunction). - Aortic valve: There was mild regurgitation. - Mitral valve: Structurally normal valve. There was mildregurgitation. - Left atrium: The atrium was mildly dilated. - Right ventricle: The cavity size was mildly dilated. Wallthickness was normal. - Right atrium: The atrium was mildly dilated. - Tricuspid valve: There was moderate regurgitation. - Pulmonary arteries: Systolic pressure  was within the normalrange. PA peak pressure: 34 mm Hg (S). - Inferior vena cava: The vessel was dilated. The respirophasicdiameter changes were blunted (< 50%), consistent with elevatedcentral venous pressure. - Pericardium, extracardiac: A trivial pericardial effusion wasidentified. Features were not consistent with tamponadephysiology.    PHYSICAL EXAM Pleasant frail anxious elderly Caucasian lady not in distress. . Afebrile. Head is  nontraumatic. Neck is supple without bruit.    Cardiac exam no murmur or gallop. Lungs are clear to auscultation. Distal pulses are well felt. Neurological Exam :  Awake  Alert oriented x 3. Normal speech and language.eye movements full without nystagmus.fundi were not visualized. Vision acuity and fields appear normal. Hearing is normal. Palatal movements are normal. Face symmetric. Tongue midline. Normal strength, tone, reflexes and coordination. Normal sensation. Gait deferred. ASSESSMENT/PLAN Ms. Ann Rodriguez is a 81 y.o. female with history of atrial fibrillation presenting with recurrent SVT who developed  L visual field cut and expressive aphasia that lasted 10 minutes. She was not considered for IV t-PA due to quick resolution of symptoms.   Stroke vs TIA:  Workup underway. Etiology pending     MRI  pending   MRA  pending   Carotid Doppler  ordered  2D Echo  No source of embolus from July  LDL ordered  eliquis for VTE prophylaxis  Diet Heart Room service appropriate?: Yes; Fluid consistency:: Thin  aspirin 81 mg orally every day prior to admission, started on eliquis (apixaban) **anticoagulation not indicated from stroke standpoint unless source of clot identified, if pt with atrial fibrillation, does need anticoagulation. Anticoagulation not recommended with SVT.  Patient counseled to be compliant with her antithrombotic medications  Ongoing aggressive stroke risk factor management  Therapy recommendations:  No PT  Disposition:  Return home  Hyperlipidemia  Home meds:  lipitor 40, resumed in hospital  LDL pending, goal < 70  Continue statin at discharge  Other Stroke Risk Factors  Advanced age  Former Cigarette smoker, quit smoking 25 years ago   Other Active Problems  Incessant SVT. (has carried a dx of Atrial Fibrillation i the past with treatment, however, recnet eval has lat to a dx of SVT) Considering ablation. Continue  eliquis through procedure,  hold amiodarone after noon. Dr. Pearlean Brownie to discuss history and plans further with Dr. Ladona Ridgel.  Hospital day # 3  Rhoderick Moody [facility] See Amion for Pager information 11/02/2014 11:24 AM  I have personally examined this patient, reviewed notes, independently viewed imaging studies, participated in medical decision making and plan of care. I have made any additions or clarifications directly to the above note. Agree with note above. She developed transient bilateral vision loss and slurred speech and possibly a TIA. She remains at risk for recurrent strokes, TIAs, neurological worsening and needs ongoing stroke evaluation and aggressive risk factor modification. I do not see a definite indication for long-term anticoagulation unless atrial fibrillation can be demonstrated. Placing a loop recorder may be considered. I had a long discussion of the bedside with the patient and her friend as well as with Dr. Ladona Ridgel over the phone about her plan of care.  Delia Heady, MD Medical Director [facility] Pager: [phone] 11/02/2014 2:07 PM    To contact Stroke Continuity provider, please refer to [email or web address]. After hours, contact General Neurology

## 2014-11-02 NOTE — Discharge Instructions (Signed)

## 2014-11-02 NOTE — Progress Notes (Signed)
SLP Cancellation Note  Patient Details Name: Ann Rodriguez MRN: [id number] DOB: 1928-09-06   Cancelled treatment:       Reason Eval/Treat Not Completed: SLP screened, no needs identified, will sign off. Please reconsult if needs arise. RN aware.  Dvonte Gatliff B. Murvin [facility], CCC-SLP [phone]  Ann Rodriguez 11/02/2014, 3:46 PM

## 2014-11-02 NOTE — Progress Notes (Signed)
OT Cancellation Note  Patient Details Name: Ann Rodriguez MRN: [id number] DOB: [birth year]   Cancelled Treatment:    Reason Eval/Treat Not Completed: Patient at procedure or test/ unavailable. Pt at MRI.  Earlie Raveling OTR/L [phone] 11/02/2014, 2:44 PM

## 2014-11-02 NOTE — Progress Notes (Signed)
    Patient Name: Ann Rodriguez Date of Encounter: 11/02/2014     Active Problems:   SVT (supraventricular tachycardia)    SUBJECTIVE  No additional TIA symptoms. Note she went back to SVT last night for an hour, breaking with IV adenosine. She is anxious and frustrated about her predicament. No obvious residual neuro deficits.  CURRENT MEDS . amiodarone      . apixaban  2.5 mg Oral BID  . aspirin EC  81 mg Oral Daily  . atorvastatin  40 mg Oral QPM  . pantoprazole  40 mg Oral Daily  . polyethylene glycol  17 g Oral QODAY    OBJECTIVE  Filed Vitals:   11/02/14 0211 11/02/14 0455 11/02/14 0619 11/02/14 0629  BP: 100/45 107/57 116/58   Pulse: 51 49 49   Temp:   97.9 F (36.6 C) 97.9 F (36.6 C)  TempSrc:   Oral   Resp: 16  18   Height:      Weight:      SpO2: 98%  100%     Intake/Output Summary (Last 24 hours) at 11/02/14 0830 Last data filed at 11/01/14 1800  Gross per 24 hour  Intake    460 ml  Output      0 ml  Net    460 ml   Filed Weights   10/30/14 1910  Weight: 148 lb (67.132 kg)    PHYSICAL EXAM  General: Pleasant, elderly woman, NAD. Neuro: Alert and oriented X 3. Moves all extremities spontaneously. Psych: Normal affect. HEENT:  Normal  Neck: Supple without bruits or JVD. Lungs:  Resp regular and unlabored, CTA. Heart: RRR no s3, s4, or murmurs. Abdomen: Soft, non-tender, non-distended, BS + x 4.  Extremities: No clubbing, cyanosis or edema. DP/PT/Radials 2+ and equal bilaterally.  Accessory Clinical Findings  CBC  Recent Labs  11/01/14 0336 11/02/14 0404  WBC 7.5 6.6  HGB 13.6 13.3  HCT 41.1 40.3  MCV 93.6 94.4  PLT 243 208   Basic Metabolic Panel  Recent Labs  11/01/14 0336 11/02/14 0404  NA 141 140  K 4.0 4.0  CL 106 107  CO2 28 29  GLUCOSE 115* 91  BUN 14 12  CREATININE 0.70 0.75  CALCIUM 9.0 8.6*   Liver Function Tests No results for input(s): AST, ALT, ALKPHOS, BILITOT, PROT, ALBUMIN in the last 72 hours. No  results for input(s): LIPASE, AMYLASE in the last 72 hours. Cardiac Enzymes  Recent Labs  10/30/14 1922  TROPONINI 0.03   BNP Invalid input(s): POCBNP D-Dimer No results for input(s): DDIMER in the last 72 hours. Hemoglobin A1C No results for input(s): HGBA1C in the last 72 hours. Fasting Lipid Panel No results for input(s): CHOL, HDL, LDLCALC, TRIG, CHOLHDL, LDLDIRECT in the last 72 hours. Thyroid Function Tests  Recent Labs  10/31/14 0500  TSH 3.943    TELE  NSR and SVT  Radiology/Studies  No results found.  ASSESSMENT AND PLAN  1. Incessant SVT 2. TIA, possibly caused by #1.  3. Initiation of Eliquis Rec: A difficult situation. She is quite anxious about her problems. If she has no additional neuro issues today, will consider catheter ablation tomorrow. I am concerned about her stroke risk and told her about this. We will continue eliquis through the procedure. Will hold IV amiodarone after noon.   Terie Lear,M.D.  Katryna Tschirhart,M.D.  11/02/2014 8:30 AM

## 2014-11-03 ENCOUNTER — Inpatient Hospital Stay (HOSPITAL_COMMUNITY): Payer: Medicare Other

## 2014-11-03 ENCOUNTER — Encounter (HOSPITAL_COMMUNITY): Admission: EM | Disposition: A | Discharge status: Home / Self Care | Payer: Medicare Other | Source: Home / Self Care

## 2014-11-03 DIAGNOSIS — I639 Cerebral infarction, unspecified: Secondary | ICD-10-CM

## 2014-11-03 DIAGNOSIS — I471 Supraventricular tachycardia: Secondary | ICD-10-CM

## 2014-11-03 DIAGNOSIS — G459 Transient cerebral ischemic attack, unspecified: Secondary | ICD-10-CM

## 2014-11-03 HISTORY — PX: ELECTROPHYSIOLOGIC STUDY: SHX172A

## 2014-11-03 LAB — CBC
HCT: 40.4 % (ref 36.0–46.0)
HEMOGLOBIN: 13.2 g/dL (ref 12.0–15.0)
MCH: 30.5 pg (ref 26.0–34.0)
MCHC: 32.7 g/dL (ref 30.0–36.0)
MCV: 93.3 fL (ref 78.0–100.0)
PLATELETS: 236 10*3/uL (ref 150–400)
RBC: 4.33 MIL/uL (ref 3.87–5.11)
RDW: 14.1 % (ref 11.5–15.5)
WBC: 7.1 10*3/uL (ref 4.0–10.5)

## 2014-11-03 LAB — LIPID PANEL
CHOLESTEROL: 161 mg/dL (ref 0–200)
HDL: 47 mg/dL (ref >40)
LDL Cholesterol: 88 mg/dL (ref 0–99)
TRIGLYCERIDES: 130 mg/dL (ref <150)
Total CHOL/HDL Ratio: 3.4 RATIO
VLDL: 26 mg/dL (ref 0–40)

## 2014-11-03 LAB — BASIC METABOLIC PANEL
ANION GAP: 9 (ref 5–15)
BUN: 16 mg/dL (ref 6–20)
CALCIUM: 9 mg/dL (ref 8.9–10.3)
CO2: 24 mmol/L (ref 22–32)
CREATININE: 0.75 mg/dL (ref 0.44–1.00)
Chloride: 107 mmol/L (ref 101–111)
GLUCOSE: 97 mg/dL (ref 65–99)
Potassium: 4.3 mmol/L (ref 3.5–5.1)
Sodium: 140 mmol/L (ref 135–145)

## 2014-11-03 SURGERY — A-FLUTTER/A-TACH/SVT ABLATION

## 2014-11-03 MED ORDER — BUPIVACAINE HCL (PF) 0.25 % IJ SOLN
INTRAMUSCULAR | Status: AC
  Filled 2014-11-03: qty 30

## 2014-11-03 MED ORDER — FENTANYL CITRATE (PF) 100 MCG/2ML IJ SOLN
INTRAMUSCULAR | Status: DC | PRN
  Administered 2014-11-03 (×2): 25 ug via INTRAVENOUS

## 2014-11-03 MED ORDER — FENTANYL CITRATE (PF) 100 MCG/2ML IJ SOLN
INTRAMUSCULAR | Status: AC
  Filled 2014-11-03: qty 4

## 2014-11-03 MED ORDER — MIDAZOLAM HCL 5 MG/5ML IJ SOLN
INTRAMUSCULAR | Status: DC | PRN
  Administered 2014-11-03 (×2): 2 mg via INTRAVENOUS

## 2014-11-03 MED ORDER — MIDAZOLAM HCL 5 MG/5ML IJ SOLN
INTRAMUSCULAR | Status: AC
  Filled 2014-11-03: qty 25

## 2014-11-03 SURGICAL SUPPLY — 11 items
BAG SNAP BAND KOVER 36X36 (MISCELLANEOUS) ×2 IMPLANT
CATH BLAZER 7FR 4MM LG 5031TK2 (ABLATOR) ×2 IMPLANT
CATH HEX JOSEPH 2-5-2 65CM 6F (CATHETERS) ×2 IMPLANT
CATH JOSEPHSON QUAD-ALLRED 6FR (CATHETERS) ×4 IMPLANT
PACK EP LATEX FREE (CUSTOM PROCEDURE TRAY) ×1
PACK EP LF (CUSTOM PROCEDURE TRAY) ×1 IMPLANT
PAD DEFIB LIFELINK (PAD) ×2 IMPLANT
SHEATH PINNACLE 6F 10CM (SHEATH) ×4 IMPLANT
SHEATH PINNACLE 7F 10CM (SHEATH) ×2 IMPLANT
SHEATH PINNACLE 8F 10CM (SHEATH) ×2 IMPLANT
SHIELD RADPAD SCOOP 12X17 (MISCELLANEOUS) ×2 IMPLANT

## 2014-11-03 NOTE — Progress Notes (Signed)
Pt transported off unit to the cath lab at 1600 for a procedure. Dionne Bucy RN

## 2014-11-03 NOTE — Progress Notes (Addendum)
Site area: right groin a 8 and 6 X2 french venous sheaths were removed  Site Prior to Removal:  Level 0  Pressure Applied For 15 MINUTES    Minutes Beginning at 1855p  Manual:   Yes.    Patient Status During Pull:  stable  Post Pull Groin Site:  Level 0  Post Pull Instructions Given:  Yes.    Post Pull Pulses Present:  Yes.    Dressing Applied:  Yes.    Comments:  VS remain stable during sheath pull. Pt denies any discomfort at site

## 2014-11-03 NOTE — Progress Notes (Signed)
STROKE TEAM PROGRESS NOTE   HISTORY ROSANNE WOHLFARTH is an 81 y.o. female with history of Afib who presented to the hospital due to recurrent SVT. She has failed medical therapy with flecainide and metoprolol and is currently in hospital for a ablation. While in hospital patient had a 10 minute episode which she could not see a small portion of her left visual field but not in a dense field cut or quadrant aspect. "just a spot in her vision". She did close each eye and it was present in both eyes. It was also noted she had difficulty finishing her sentences due to word finding difficulty. Currently all symptoms have resolved and cardiology would like a consult prior to ablation.   Date last known well: Date: 11/01/2014 Time last known well: Time: 15:58 tPA Given: No: symptoms resolved.    SUBJECTIVE (INTERVAL HISTORY)    Overall she feels her condition is stable. Later today she has  planned  ablation treatment for SVT. MRI brain shows no infarcts and MRA is normal OBJECTIVE Temp:  [97.9 F (36.6 C)-98.8 F (37.1 C)] 97.9 F (36.6 C) (08/24 0434) Pulse Rate:  [58-60] 58 (08/24 0434) Cardiac Rhythm:  [-] Normal sinus rhythm (08/24 0830) Resp:  [16-18] 16 (08/24 0434) BP: (113-121)/(56-63) 113/63 mmHg (08/24 0434) SpO2:  [96 %-100 %] 100 % (08/24 0434)  No results for input(s): GLUCAP in the last 168 hours.  Recent Labs Lab 10/30/14 0407 10/31/14 0500 11/01/14 0336 11/02/14 0404 11/03/14 0450  NA 141 142 141 140 140  K 4.1 3.9 4.0 4.0 4.3  CL 108 109 106 107 107  CO2 25 26 28 29 24   GLUCOSE 112* 98 115* 91 97  BUN 16 9 14 12 16   CREATININE 0.74 0.68 0.70 0.75 0.75  CALCIUM 9.7 9.0 9.0 8.6* 9.0   No results for input(s): AST, ALT, ALKPHOS, BILITOT, PROT, ALBUMIN in the last 168 hours.  Recent Labs Lab 10/30/14 0407 10/31/14 0500 11/01/14 0336 11/02/14 0404 11/03/14 0450  WBC 8.3 5.6 7.5 6.6 7.1  NEUTROABS 5.1  --   --   --   --   HGB 14.8 12.8 13.6 13.3 13.2  HCT  44.1 39.9 41.1 40.3 40.4  MCV 94.0 93.9 93.6 94.4 93.3  PLT 279 218 243 208 236    Recent Labs Lab 10/30/14 1922  TROPONINI 0.03   No results for input(s): LABPROT, INR in the last 72 hours. No results for input(s): COLORURINE, LABSPEC, PHURINE, GLUCOSEU, HGBUR, BILIRUBINUR, KETONESUR, PROTEINUR, UROBILINOGEN, NITRITE, LEUKOCYTESUR in the last 72 hours.  Invalid input(s): APPERANCEUR     Component Value Date/Time   CHOL 161 11/03/2014 0450   TRIG 130 11/03/2014 0450   HDL 47 11/03/2014 0450   CHOLHDL 3.4 11/03/2014 0450   VLDL 26 11/03/2014 0450   LDLCALC 88 11/03/2014 0450   No results found for: HGBA1C No results found for: LABOPIA, COCAINSCRNUR, LABBENZ, AMPHETMU, THCU, LABBARB  No results for input(s): ETH in the last 168 hours.   IMAGING  Mr Brain Wo Contrast  11/02/2014   CLINICAL DATA:  Acute onset of visual deficit.  Rule out stroke.  EXAM: MRI HEAD WITHOUT CONTRAST  MRA HEAD WITHOUT CONTRAST  TECHNIQUE: Multiplanar, multiecho pulse sequences of the brain and surrounding structures were obtained without intravenous contrast. Angiographic images of the head were obtained using MRA technique without contrast.  COMPARISON:  CT 03/29/2011  FINDINGS: MRI HEAD FINDINGS  Negative for acute infarct.  Ventricle size is normal.  Cerebral  volume is normal for age.  Minimal chronic microvascular ischemic change in the white matter. Prominent perivascular spaces in the basal ganglia and midbrain.  Negative for hemorrhage or mass lesion.  Pituitary normal in size. Paranasal sinuses clear. Bilateral lens extraction.  MRA HEAD FINDINGS  Both vertebral arteries patent to the basilar. Atherosclerotic irregularity distal left vertebral artery. PICA patent bilaterally. AICA patent. Superior cerebellar and posterior cerebral arteries patent. Fetal origin of the posterior cerebral artery with hypoplastic P1 segments bilaterally.  Internal carotid artery patent bilaterally without stenosis. Anterior  and middle cerebral arteries patent bilaterally  Negative for cerebral aneurysm.  IMPRESSION: Mild chronic microvascular ischemic change.  No acute infarct.  Mild atherosclerotic disease distal left vertebral artery. No significant intracranial stenosis.   Electronically Signed   By: Marlan Palau M.D.   On: 11/02/2014 16:03   Mr Palma Holter  11/02/2014   CLINICAL DATA:  Acute onset of visual deficit.  Rule out stroke.  EXAM: MRI HEAD WITHOUT CONTRAST  MRA HEAD WITHOUT CONTRAST  TECHNIQUE: Multiplanar, multiecho pulse sequences of the brain and surrounding structures were obtained without intravenous contrast. Angiographic images of the head were obtained using MRA technique without contrast.  COMPARISON:  CT 03/29/2011  FINDINGS: MRI HEAD FINDINGS  Negative for acute infarct.  Ventricle size is normal.  Cerebral volume is normal for age.  Minimal chronic microvascular ischemic change in the white matter. Prominent perivascular spaces in the basal ganglia and midbrain.  Negative for hemorrhage or mass lesion.  Pituitary normal in size. Paranasal sinuses clear. Bilateral lens extraction.  MRA HEAD FINDINGS  Both vertebral arteries patent to the basilar. Atherosclerotic irregularity distal left vertebral artery. PICA patent bilaterally. AICA patent. Superior cerebellar and posterior cerebral arteries patent. Fetal origin of the posterior cerebral artery with hypoplastic P1 segments bilaterally.  Internal carotid artery patent bilaterally without stenosis. Anterior and middle cerebral arteries patent bilaterally  Negative for cerebral aneurysm.  IMPRESSION: Mild chronic microvascular ischemic change.  No acute infarct.  Mild atherosclerotic disease distal left vertebral artery. No significant intracranial stenosis.   Electronically Signed   By: Marlan Palau M.D.   On: 11/02/2014 16:03    2D ECHOCARDIOGRAM  09/29/2014 - Left ventricle: The cavity size was normal. Systolic function wasnormal. The estimated  ejection fraction was in the range of 55%to 60%. Wall motion was normal; there were no regional wallmotion abnormalities. Doppler parameters are consistent withabnormal left ventricular relaxation (grade 1 diastolicdysfunction). - Aortic valve: There was mild regurgitation. - Mitral valve: Structurally normal valve. There was mildregurgitation. - Left atrium: The atrium was mildly dilated. - Right ventricle: The cavity size was mildly dilated. Wallthickness was normal. - Right atrium: The atrium was mildly dilated. - Tricuspid valve: There was moderate regurgitation. - Pulmonary arteries: Systolic pressure was within the normalrange. PA peak pressure: 34 mm Hg (S). - Inferior vena cava: The vessel was dilated. The respirophasicdiameter changes were blunted (< 50%), consistent with elevatedcentral venous pressure. - Pericardium, extracardiac: A trivial pericardial effusion wasidentified. Features were not consistent with tamponadephysiology.    PHYSICAL EXAM Pleasant frail anxious elderly Caucasian lady not in distress. . Afebrile. Head is nontraumatic. Neck is supple without bruit.    Cardiac exam no murmur or gallop. Lungs are clear to auscultation. Distal pulses are well felt. Neurological Exam :  Awake  Alert oriented x 3. Normal speech and language.eye movements full without nystagmus.fundi were not visualized. Vision acuity and fields appear normal. Hearing is normal. Palatal movements are normal. Face  symmetric. Tongue midline. Normal strength, tone, reflexes and coordination. Normal sensation. Gait deferred. ASSESSMENT/PLAN Ms. MARGEART ALLENDER is a 81 y.o. female with history of atrial fibrillation presenting with recurrent SVT who developed  L visual field cut and expressive aphasia that lasted 10 minutes. She was not considered for IV t-PA due to quick resolution of symptoms.   Stroke vs TIA:  Workup underway. Etiology pending     MRI   Brain no acute infarct  MRA  No  large vessel stenosis  Carotid Doppler  ordered  2D Echo  No source of embolus from  Study inJuly 2016  LDL ordered  eliquis for VTE prophylaxis Diet NPO time specified Except for: Sips with Meds  aspirin 81 mg orally every day prior to admission, started on eliquis (apixaban) **anticoagulation not indicated from stroke standpoint unless source of clot identified, if pt with atrial fibrillation, does need anticoagulation. Anticoagulation not recommended with SVT.  Patient counseled to be compliant with her antithrombotic medications  Ongoing aggressive stroke risk factor management  Therapy recommendations:  No PT  Disposition:  Return home  Hyperlipidemia  Home meds:  lipitor 40, resumed in hospital  LDL pending, goal < 70  Continue statin at discharge  Other Stroke Risk Factors  Advanced age  Former Cigarette smoker, quit smoking 25 years ago   Other Active Problems  Incessant SVT. (has carried a dx of Atrial Fibrillation i the past with treatment, however, recent eval has led to a dx of SVT) Considering ablation. Continue  eliquis through procedure, hold amiodarone after noon.    Hospital day # 4  SETHI,PRAMOD  [facility] See Amion for Pager information 11/03/2014 1:10 PM  I have personally examined this patient, reviewed notes, independently viewed imaging studies, participated in medical decision making and plan of care. I have made any additions or clarifications directly to the above note. Agree with note above. She developed transient bilateral vision loss and slurred speech and possibly a TIA. I do not see a definite indication for long-term anticoagulation unless atrial fibrillation can be demonstrated. Placing a loop recorder may be considered. I had a long discussion of the bedside with the patient  about her plan of care.  Delia Heady, MD Medical Director [facility] Pager: [phone] 11/03/2014 1:10 PM    To contact  Stroke Continuity provider, please refer to [email or web address]. After hours, contact General Neurology

## 2014-11-03 NOTE — Progress Notes (Signed)
*  PRELIMINARY RESULTS* Vascular Ultrasound Carotid Duplex (Doppler) has been completed.  Preliminary findings: Bilateral:  1-39% ICA stenosis.  Vertebral artery flow is antegrade.     Farrel Demark, RDMS, RVT  11/03/2014, 10:19 AM

## 2014-11-03 NOTE — Progress Notes (Signed)
Site area: right internal jugular a 7 french sheath was removed  Site Prior to Removal:  Level 0  Pressure Applied For 10 MINUTES    Minutes Beginning at 1840p  Manual:   Yes.    Patient Status During Pull:  stable  Post Pull Groin Site:  Level 0  Post Pull Instructions Given:  Yes.    Post Pull Pulses Present:  Yes.    Dressing Applied:  Yes.    Comments:  VS remain stable during sheath pull. Pt resting with eye closed and resp unlabored.

## 2014-11-03 NOTE — Progress Notes (Signed)
Subjective: No complaints and no further SVT  No symptoms of TIA.  Objective: Vital signs in last 24 hours: Temp:  [97.9 F (36.6 C)-98.8 F (37.1 C)] 97.9 F (36.6 C) (08/24 0434) Pulse Rate:  [58-71] 58 (08/24 0434) Resp:  [16-18] 16 (08/24 0434) BP: (113-132)/(56-84) 113/63 mmHg (08/24 0434) SpO2:  [96 %-100 %] 100 % (08/24 0434) Weight change:  Last BM Date: 11/02/14 Intake/Output from previous day: 08/23 0701 - 08/24 0700 In: 9236.8 [I.V.:9236.8] Out: -  Intake/Output this shift:    PE: General:Pleasant affect, NAD Skin:Warm and dry, brisk capillary refill HEENT:normocephalic, sclera clear, mucus membranes moist Heart:S1S2 RRR without murmur, gallup, rub or click Lungs:clear without rales, rhonchi, or wheezes VQQ:VZDG, non tender, + BS, do not palpate liver spleen or masses Ext:no lower ext edema, 2+ pedal pulses, 2+ radial pulses Neuro:alert and oriented X 3, MAE, follows commands, + facial symmetry Tele:  No SVT since 11/01/14  occ PVCs other wise  SR did note SB mostly in 50s on occ.  Lab Results:  Recent Labs  11/02/14 0404 11/03/14 0450  WBC 6.6 7.1  HGB 13.3 13.2  HCT 40.3 40.4  PLT 208 236   BMET  Recent Labs  11/02/14 0404 11/03/14 0450  NA 140 140  K 4.0 4.3  CL 107 107  CO2 29 24  GLUCOSE 91 97  BUN 12 16  CREATININE 0.75 0.75  CALCIUM 8.6* 9.0   No results for input(s): TROPONINI in the last 72 hours.  Invalid input(s): CK, MB  Lab Results  Component Value Date   CHOL 161 11/03/2014   HDL 47 11/03/2014   LDLCALC 88 11/03/2014   TRIG 130 11/03/2014   CHOLHDL 3.4 11/03/2014   No results found for: HGBA1C   Lab Results  Component Value Date   TSH 3.943 10/31/2014    Hepatic Function Panel No results for input(s): PROT, ALBUMIN, AST, ALT, ALKPHOS, BILITOT, BILIDIR, IBILI in the last 72 hours.  Recent Labs  11/03/14 0450  CHOL 161   No results for input(s): PROTIME in the last 72  hours.     Studies/Results: Mr Sherrin Daisy Contrast  11/02/2014   CLINICAL DATA:  Acute onset of visual deficit.  Rule out stroke.  EXAM: MRI HEAD WITHOUT CONTRAST  MRA HEAD WITHOUT CONTRAST  TECHNIQUE: Multiplanar, multiecho pulse sequences of the brain and surrounding structures were obtained without intravenous contrast. Angiographic images of the head were obtained using MRA technique without contrast.  COMPARISON:  CT 03/29/2011  FINDINGS: MRI HEAD FINDINGS  Negative for acute infarct.  Ventricle size is normal.  Cerebral volume is normal for age.  Minimal chronic microvascular ischemic change in the white matter. Prominent perivascular spaces in the basal ganglia and midbrain.  Negative for hemorrhage or mass lesion.  Pituitary normal in size. Paranasal sinuses clear. Bilateral lens extraction.  MRA HEAD FINDINGS  Both vertebral arteries patent to the basilar. Atherosclerotic irregularity distal left vertebral artery. PICA patent bilaterally. AICA patent. Superior cerebellar and posterior cerebral arteries patent. Fetal origin of the posterior cerebral artery with hypoplastic P1 segments bilaterally.  Internal carotid artery patent bilaterally without stenosis. Anterior and middle cerebral arteries patent bilaterally  Negative for cerebral aneurysm.  IMPRESSION: Mild chronic microvascular ischemic change.  No acute infarct.  Mild atherosclerotic disease distal left vertebral artery. No significant intracranial stenosis.   Electronically Signed   By: Marlan Palau M.D.   On: 11/02/2014 16:03   Mr Maxine Glenn  Headm  11/02/2014   CLINICAL DATA:  Acute onset of visual deficit.  Rule out stroke.  EXAM: MRI HEAD WITHOUT CONTRAST  MRA HEAD WITHOUT CONTRAST  TECHNIQUE: Multiplanar, multiecho pulse sequences of the brain and surrounding structures were obtained without intravenous contrast. Angiographic images of the head were obtained using MRA technique without contrast.  COMPARISON:  CT 03/29/2011  FINDINGS: MRI  HEAD FINDINGS  Negative for acute infarct.  Ventricle size is normal.  Cerebral volume is normal for age.  Minimal chronic microvascular ischemic change in the white matter. Prominent perivascular spaces in the basal ganglia and midbrain.  Negative for hemorrhage or mass lesion.  Pituitary normal in size. Paranasal sinuses clear. Bilateral lens extraction.  MRA HEAD FINDINGS  Both vertebral arteries patent to the basilar. Atherosclerotic irregularity distal left vertebral artery. PICA patent bilaterally. AICA patent. Superior cerebellar and posterior cerebral arteries patent. Fetal origin of the posterior cerebral artery with hypoplastic P1 segments bilaterally.  Internal carotid artery patent bilaterally without stenosis. Anterior and middle cerebral arteries patent bilaterally  Negative for cerebral aneurysm.  IMPRESSION: Mild chronic microvascular ischemic change.  No acute infarct.  Mild atherosclerotic disease distal left vertebral artery. No significant intracranial stenosis.   Electronically Signed   By: Marlan Palau M.D.   On: 11/02/2014 16:03    Medications: I have reviewed the patient's current medications. Scheduled Meds: . apixaban  2.5 mg Oral BID  . aspirin EC  81 mg Oral Daily  . atorvastatin  40 mg Oral QPM  . pantoprazole  40 mg Oral Daily  . polyethylene glycol  17 g Oral QODAY   Continuous Infusions: . sodium chloride 10 mL/hr (10/31/14 2206)  . sodium chloride 50 mL/hr at 11/03/14 0548  . diltiazem (CARDIZEM) infusion 5 mg/hr (11/01/14 0015)   PRN Meds:.acetaminophen, fluticasone, ondansetron (ZOFRAN) IV  Assessment/Plan: Principal Problem:   SVT (supraventricular tachycardia)-initially with TIA SVT ablation was held but she had recurrent SVT and after discussion with Dr. Ladona Ridgel, he is considering Catheter ablation.  Depending on TIA symptoms, He did discuss her stroke risk with her.  We will continue eliquis. She is NPO  Active Problems:   TIA (transient ischemic  attack)    LOS: 4 days   Time spent with pt. :15 minutes. [facility] R  Nurse Practitioner Certified Pager [phone] or after 5pm and on weekends call [phone] 11/03/2014, 10:51 AM   EP Attending  Patient seen and examined. Agree with above. Will proceed with ablation as our schedule permits.  Leonia Reeves.D.

## 2014-11-03 NOTE — Progress Notes (Signed)
OT Cancellation Note  Patient Details Name: Ann Rodriguez MRN: [id number] DOB: 1928/10/13   Cancelled Treatment:    Reason Eval/Treat Not Completed: OT screened, no needs identified, will sign off  Earlie Raveling OTR/L [phone] 11/03/2014, 2:18 PM

## 2014-11-04 ENCOUNTER — Encounter (HOSPITAL_COMMUNITY): Payer: Self-pay | Admitting: Internal Medicine

## 2014-11-04 LAB — CBC
HEMATOCRIT: 40.1 % (ref 36.0–46.0)
HEMOGLOBIN: 13.4 g/dL (ref 12.0–15.0)
MCH: 31.3 pg (ref 26.0–34.0)
MCHC: 33.4 g/dL (ref 30.0–36.0)
MCV: 93.7 fL (ref 78.0–100.0)
Platelets: 220 10*3/uL (ref 150–400)
RBC: 4.28 MIL/uL (ref 3.87–5.11)
RDW: 14.2 % (ref 11.5–15.5)
WBC: 7.7 10*3/uL (ref 4.0–10.5)

## 2014-11-04 LAB — BASIC METABOLIC PANEL
Anion gap: 8 (ref 5–15)
BUN: 11 mg/dL (ref 6–20)
CHLORIDE: 108 mmol/L (ref 101–111)
CO2: 24 mmol/L (ref 22–32)
Calcium: 8.6 mg/dL — ABNORMAL LOW (ref 8.9–10.3)
Creatinine, Ser: 0.57 mg/dL (ref 0.44–1.00)
GFR calc Af Amer: >60 mL/min (ref >60)
GFR calc non Af Amer: >60 mL/min (ref >60)
Glucose, Bld: 83 mg/dL (ref 65–99)
POTASSIUM: 3.9 mmol/L (ref 3.5–5.1)
SODIUM: 140 mmol/L (ref 135–145)

## 2014-11-04 MED ORDER — ONDANSETRON HCL 4 MG/2ML IJ SOLN: 4.0000 mg | Freq: Four times a day (QID) | INTRAMUSCULAR | Status: DC | PRN

## 2014-11-04 MED ORDER — ACETAMINOPHEN 325 MG PO TABS: 650.0000 mg | ORAL_TABLET | ORAL | Status: DC | PRN

## 2014-11-04 MED ORDER — APIXABAN 2.5 MG PO TABS: 2.5000 mg | ORAL_TABLET | Freq: Two times a day (BID) | ORAL | Status: DC

## 2014-11-04 MED ORDER — SODIUM CHLORIDE 0.9 % IV SOLN: 250.0000 mL | INTRAVENOUS | Status: DC | PRN

## 2014-11-04 MED ORDER — SODIUM CHLORIDE 0.9 % IJ SOLN
3.0000 mL | Freq: Two times a day (BID) | INTRAMUSCULAR | Status: DC
  Administered 2014-11-04: 3 mL via INTRAVENOUS

## 2014-11-04 MED ORDER — SODIUM CHLORIDE 0.9 % IJ SOLN: 3.0000 mL | INTRAMUSCULAR | Status: DC | PRN

## 2014-11-04 MED FILL — Bupivacaine HCl Preservative Free (PF) Inj 0.25%: INTRAMUSCULAR | Qty: 60 | Status: AC

## 2014-11-04 NOTE — Progress Notes (Signed)
Pt discharge education and instructions completed with pt and friend at bedside; both voices understanding and denies any questions. Pt IV and telemetry removed; pt discharge home with friend to transport her home. Pt right groin site and right IJ site remains level 0, no active bleeding, hematoma or bruising noted; clean, dry dsg remains intact. Pt to pick up electronically sent prescription from preferred pharmacy on file. Pt transported off unit via wheelchair with belongings to the side. Arabella Merles Mandell Pangborn RN.

## 2014-11-04 NOTE — Discharge Summary (Signed)
Physician Discharge Summary  Patient ID: VADIE PRINCIPATO MRN: [id number] DOB/AGE: 09/06/1928 81 y.o.  Admit date: 10/30/2014 Discharge date: 11/04/2014   Primary Cardiologist: Dr. Katrinka Blazing Electrophysiologist: Dr. Ladona Ridgel  Admission Diagnoses: SVT  Discharge Diagnoses:  Principal Problem:   SVT (supraventricular tachycardia) Active Problems:   TIA (transient ischemic attack)   Discharged Condition: stable  Hospital Course: 81 y.o. white or Caucasian female with prior history of atrial fibrillation (rhythm controlled with flecainide), SVT, hypertension, chronic diastolic heart failure and OA who presented 10/30/14 with palpitations to the ED. On arrival, she was noted to be in SVT with a rate of 160 bpm. She was highly symptomatic.  She was given 6 mg of adenosine first and then another 6 mg of adenosine with which she converted from SVT to NSR. She was discharge home on metoprolol, but later returned the same day to the ED with recurrent symptomatic SVT. She was re-admitted. Her metoprolol was discontinued and she was changed to diltiazem. EP was consulted and she was seen by Dr. Ladona Ridgel, who recommended ablation. However on 11/01/14, prior to undergoing her EP study, she developed decreased vision out of her left eye and aphasia. Symptoms lasted roughly 10 minutes and spontaneously resolved. Neurology was consulted. MRI/MRA were negative. It was felt that she suffered a TIA. She had no recurrent symptoms. Neurology recommended that she continue anticoagulation with Eliquis.   After she recovered from her TIA, it was decided to continue with SVT ablation. The procedure was performed by Dr. Ladona Ridgel on 11/03/14. EP study induced classic AV nodal reentrant tachycardia and she underwent s/p successful RF ablation.  No inducible arrhythmias following ablation. She had no post procedural complications. She denied CP and dyspnea. She had no recurrent arrhthymias overnight. She was last seen and examined by Dr.  Elberta Fortis who determined she was stable for discharge home. F/u Red Mandt be arranged with Dr. Ladona Ridgel.    Consults: neurology  Significant Diagnostic Studies: EP Study 11/03/14 CONCLUSIONS:  1. Sinus rhythm upon presentation.  2. The patient had dual AV nodal physiology with easily inducible classic AV nodal reentrant tachycardia, there were no other accessory pathways or arrhythmias induced  3. Successful radiofrequency modification of the slow AV nodal pathway  4. No inducible arrhythmias following ablation.  5. No early apparent complications.    Treatments: See Hospital Course  Discharge Exam: Blood pressure 106/53, pulse 70, temperature 98 F (36.7 C), temperature source Oral, resp. rate 16, height 5\' 6"  (1.676 m), weight 148 lb (67.132 kg), SpO2 96 %.   Disposition: 01-Home or Self Care      Discharge Instructions    Ambulatory referral to Neurology    Complete by:  As directed   Stroke office f/u in 2 month     Diet - low sodium heart healthy    Complete by:  As directed      Increase activity slowly    Complete by:  As directed             Medication List    TAKE these medications        apixaban 2.5 MG Tabs tablet  Commonly known as:  ELIQUIS  Take 1 tablet (2.5 mg total) by mouth 2 (two) times daily.     aspirin EC 81 MG tablet  Take 81 mg by mouth daily.     atorvastatin 40 MG tablet  Commonly known as:  LIPITOR  Take 40 mg by mouth every evening.     beta carotene w/minerals  tablet  Take 1 tablet by mouth daily with breakfast.     BIOTIN PO  Take 1 tablet by mouth daily.     calcium carbonate 600 MG Tabs tablet  Commonly known as:  OS-CAL  Take 1,800 mg by mouth daily with breakfast.     cholecalciferol 1000 UNITS tablet  Commonly known as:  VITAMIN D  Take 1,000 Units by mouth 2 (two) times daily.     esomeprazole 20 MG capsule  Commonly known as:  NEXIUM  Take 20 mg by mouth every morning.     polyethylene glycol packet  Commonly known as:   MIRALAX / GLYCOLAX  Take 17 g by mouth every other day.       Follow-up Information    Follow up with Lewayne Bunting, MD.   Specialty:  Cardiology   Why:  our office Sareen Randon call you with a follow-up appointment   Contact information:   [address] [place] [address] [phone]       TIME SPENT ON DISCHARGE, INCLUDING PHYSICIAN TIME: > 30 MINUTES  Signed: SIMMONS, BRITTAINY 11/04/2014, 1:20 PM  I have seen and examined this patient with Robbie Lis.  Agree with above, note added to reflect my findings.  On exam, regular with no murmurs, no wheezing.  Had incessant SVT and went for ablation.  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  On anticoagulation for possible stroke like symptoms.  Nash Bolls be discharged on anticoagulation with plan for discussion at the next clinic appointment.  Landyn Lorincz M. Patrick Salemi MD 11/09/2014 12:53 PM

## 2014-11-04 NOTE — Progress Notes (Signed)
STROKE TEAM PROGRESS NOTE   HISTORY Ann Rodriguez is an 81 y.o. female with history of Afib who presented to the hospital due to recurrent SVT. She has failed medical therapy with flecainide and metoprolol and is currently in hospital for a ablation. While in hospital patient had a 10 minute episode which she could not see a small portion of her left visual field but not in a dense field cut or quadrant aspect. "just a spot in her vision". She did close each eye and it was present in both eyes. It was also noted she had difficulty finishing her sentences due to word finding difficulty. Currently all symptoms have resolved and cardiology would like a consult prior to ablation.   Date last known well: Date: 11/01/2014 Time last known well: Time: 15:58 tPA Given: No: symptoms resolved.    SUBJECTIVE (INTERVAL HISTORY)    Overall she feels her condition is stable. Yesterday she had  ablation treatment for SVT. No complaints today.  OBJECTIVE Temp:  [97.6 F (36.4 C)-98.1 F (36.7 C)] 98 F (36.7 C) (08/25 0927) Pulse Rate:  [54-70] 70 (08/25 0927) Cardiac Rhythm:  [-] Other (Comment) (08/25 0700) Resp:  [0-25] 16 (08/25 0927) BP: (106-148)/(53-79) 106/53 mmHg (08/25 0927) SpO2:  [94 %-100 %] 96 % (08/25 0927)  No results for input(s): GLUCAP in the last 168 hours.  Recent Labs Lab 10/31/14 0500 11/01/14 0336 11/02/14 0404 11/03/14 0450 11/04/14 0433  NA 142 141 140 140 140  K 3.9 4.0 4.0 4.3 3.9  CL 109 106 107 107 108  CO2 26 28 29 24 24   GLUCOSE 98 115* 91 97 83  BUN 9 14 12 16 11   CREATININE 0.68 0.70 0.75 0.75 0.57  CALCIUM 9.0 9.0 8.6* 9.0 8.6*   No results for input(s): AST, ALT, ALKPHOS, BILITOT, PROT, ALBUMIN in the last 168 hours.  Recent Labs Lab 10/30/14 0407 10/31/14 0500 11/01/14 0336 11/02/14 0404 11/03/14 0450 11/04/14 0433  WBC 8.3 5.6 7.5 6.6 7.1 7.7  NEUTROABS 5.1  --   --   --   --   --   HGB 14.8 12.8 13.6 13.3 13.2 13.4  HCT 44.1 39.9 41.1  40.3 40.4 40.1  MCV 94.0 93.9 93.6 94.4 93.3 93.7  PLT 279 218 243 208 236 220    Recent Labs Lab 10/30/14 1922  TROPONINI 0.03   No results for input(s): LABPROT, INR in the last 72 hours. No results for input(s): COLORURINE, LABSPEC, PHURINE, GLUCOSEU, HGBUR, BILIRUBINUR, KETONESUR, PROTEINUR, UROBILINOGEN, NITRITE, LEUKOCYTESUR in the last 72 hours.  Invalid input(s): APPERANCEUR     Component Value Date/Time   CHOL 161 11/03/2014 0450   TRIG 130 11/03/2014 0450   HDL 47 11/03/2014 0450   CHOLHDL 3.4 11/03/2014 0450   VLDL 26 11/03/2014 0450   LDLCALC 88 11/03/2014 0450   No results found for: HGBA1C No results found for: LABOPIA, COCAINSCRNUR, LABBENZ, AMPHETMU, THCU, LABBARB  No results for input(s): ETH in the last 168 hours.   IMAGING  Mr Brain Wo Contrast  11/02/2014   CLINICAL DATA:  Acute onset of visual deficit.  Rule out stroke.  EXAM: MRI HEAD WITHOUT CONTRAST  MRA HEAD WITHOUT CONTRAST  TECHNIQUE: Multiplanar, multiecho pulse sequences of the brain and surrounding structures were obtained without intravenous contrast. Angiographic images of the head were obtained using MRA technique without contrast.  COMPARISON:  CT 03/29/2011  FINDINGS: MRI HEAD FINDINGS  Negative for acute infarct.  Ventricle size is normal.  Cerebral volume is normal for age.  Minimal chronic microvascular ischemic change in the white matter. Prominent perivascular spaces in the basal ganglia and midbrain.  Negative for hemorrhage or mass lesion.  Pituitary normal in size. Paranasal sinuses clear. Bilateral lens extraction.  MRA HEAD FINDINGS  Both vertebral arteries patent to the basilar. Atherosclerotic irregularity distal left vertebral artery. PICA patent bilaterally. AICA patent. Superior cerebellar and posterior cerebral arteries patent. Fetal origin of the posterior cerebral artery with hypoplastic P1 segments bilaterally.  Internal carotid artery patent bilaterally without stenosis. Anterior  and middle cerebral arteries patent bilaterally  Negative for cerebral aneurysm.  IMPRESSION: Mild chronic microvascular ischemic change.  No acute infarct.  Mild atherosclerotic disease distal left vertebral artery. No significant intracranial stenosis.   Electronically Signed   By: Marlan Palau M.D.   On: 11/02/2014 16:03   Mr Palma Holter  11/02/2014   CLINICAL DATA:  Acute onset of visual deficit.  Rule out stroke.  EXAM: MRI HEAD WITHOUT CONTRAST  MRA HEAD WITHOUT CONTRAST  TECHNIQUE: Multiplanar, multiecho pulse sequences of the brain and surrounding structures were obtained without intravenous contrast. Angiographic images of the head were obtained using MRA technique without contrast.  COMPARISON:  CT 03/29/2011  FINDINGS: MRI HEAD FINDINGS  Negative for acute infarct.  Ventricle size is normal.  Cerebral volume is normal for age.  Minimal chronic microvascular ischemic change in the white matter. Prominent perivascular spaces in the basal ganglia and midbrain.  Negative for hemorrhage or mass lesion.  Pituitary normal in size. Paranasal sinuses clear. Bilateral lens extraction.  MRA HEAD FINDINGS  Both vertebral arteries patent to the basilar. Atherosclerotic irregularity distal left vertebral artery. PICA patent bilaterally. AICA patent. Superior cerebellar and posterior cerebral arteries patent. Fetal origin of the posterior cerebral artery with hypoplastic P1 segments bilaterally.  Internal carotid artery patent bilaterally without stenosis. Anterior and middle cerebral arteries patent bilaterally  Negative for cerebral aneurysm.  IMPRESSION: Mild chronic microvascular ischemic change.  No acute infarct.  Mild atherosclerotic disease distal left vertebral artery. No significant intracranial stenosis.   Electronically Signed   By: Marlan Palau M.D.   On: 11/02/2014 16:03    2D ECHOCARDIOGRAM  09/29/2014 - Left ventricle: The cavity size was normal. Systolic function wasnormal. The estimated  ejection fraction was in the range of 55%to 60%. Wall motion was normal; there were no regional wallmotion abnormalities. Doppler parameters are consistent withabnormal left ventricular relaxation (grade 1 diastolicdysfunction). - Aortic valve: There was mild regurgitation. - Mitral valve: Structurally normal valve. There was mildregurgitation. - Left atrium: The atrium was mildly dilated. - Right ventricle: The cavity size was mildly dilated. Wallthickness was normal. - Right atrium: The atrium was mildly dilated. - Tricuspid valve: There was moderate regurgitation. - Pulmonary arteries: Systolic pressure was within the normalrange. PA peak pressure: 34 mm Hg (S). - Inferior vena cava: The vessel was dilated. The respirophasicdiameter changes were blunted (< 50%), consistent with elevatedcentral venous pressure. - Pericardium, extracardiac: A trivial pericardial effusion wasidentified. Features were not consistent with tamponadephysiology.    PHYSICAL EXAM Pleasant frail anxious elderly Caucasian lady not in distress. . Afebrile. Head is nontraumatic. Neck is supple without bruit.    Cardiac exam no murmur or gallop. Lungs are clear to auscultation. Distal pulses are well felt. Neurological Exam :  Awake  Alert oriented x 3. Normal speech and language.eye movements full without nystagmus.fundi were not visualized. Vision acuity and fields appear normal. Hearing is normal. Palatal movements are normal.  Face symmetric. Tongue midline. Normal strength, tone, reflexes and coordination. Normal sensation. Gait deferred. ASSESSMENT/PLAN Ms. Ann Rodriguez is a 81 y.o. female with history of atrial fibrillation presenting with recurrent SVT who developed  L visual field cut and expressive aphasia that lasted 10 minutes. She was not considered for IV t-PA due to quick resolution of symptoms.   ProbableTIA:        MRI   Brain no acute infarct  MRA  No large vessel stenosis  Carotid  Doppler  ordered  2D Echo  No source of embolus from  Study inJuly 2016  LDL ordered  eliquis for VTE prophylaxis Diet Heart Room service appropriate?: Yes; Fluid consistency:: Thin  aspirin 81 mg orally every day prior to admission, started on eliquis (apixaban) **anticoagulation not indicated from stroke standpoint unless source of clot identified, if pt with atrial fibrillation, does need anticoagulation. Anticoagulation not recommended with SVT.  Patient counseled to be compliant with her antithrombotic medications  Ongoing aggressive stroke risk factor management  Therapy recommendations:  No PT  Disposition:  Return home  Hyperlipidemia  Home meds:  lipitor 40, resumed in hospital  LDL 88, goal < 70  Continue statin at discharge  Other Stroke Risk Factors  Advanced age  Former Cigarette smoker, quit smoking 25 years ago   Other Active Problems  Incessant SVT. (has carried a dx of Atrial Fibrillation i the past with treatment, however, recent eval has led to a dx of SVT) Considering ablation. Continue  eliquis through procedure, hold amiodarone after noon.    Hospital day # 5  Nathyn Luiz  [facility] See Amion for Pager information 11/04/2014 10:45 AM  I have personally examined this patient, reviewed notes, independently viewed imaging studies, participated in medical decision making and plan of care. I have made any additions or clarifications directly to the above note.   She developed transient bilateral vision loss and slurred speech and possibly a TIA. I do not see a definite indication for long-term anticoagulation unless atrial fibrillation can be demonstrated. Placing a loop recorder may be considered. I had a long discussion of the bedside with the patient  about her plan of care. Plan is to use short-term anticoagulation for 3 weeks with eliquis followed by antiplatelets therapy. Stroke team will sign off at the present time. Follow-up as  outpatient in stroke clinic in 2 months. Delia Heady, MD Medical Director [facility] Pager: [phone] 11/04/2014 10:45 AM    To contact Stroke Continuity provider, please refer to [email or web address]. After hours, contact General Neurology

## 2014-11-04 NOTE — Progress Notes (Signed)
Patient Profile: 81 y/o female admitted for symptomatic SVT. S/p SVT ablation 8/24. Hospital course also notable for TIA.  Subjective: No complaints.   Objective: Vital signs in last 24 hours: Temp:  [97.6 F (36.4 C)-98.1 F (36.7 C)] 97.8 F (36.6 C) (08/25 0510) Pulse Rate:  [54-67] 63 (08/25 0510) Resp:  [0-25] 16 (08/25 0510) BP: (115-148)/(58-79) 131/68 mmHg (08/25 0510) SpO2:  [94 %-100 %] 100 % (08/25 0510) Last BM Date: 11/02/14  Intake/Output from previous day: 08/24 0701 - 08/25 0700 In: -  Out: 300 [Urine:300] Intake/Output this shift:    Medications Current Facility-Administered Medications  Medication Dose Route Frequency Provider Last Rate Last Dose  . 0.9 %  sodium chloride infusion  250 mL Intravenous PRN Marinus Maw, MD      . acetaminophen (TYLENOL) tablet 650 mg  650 mg Oral Q4H PRN Glenford Bayley, MD      . acetaminophen (TYLENOL) tablet 650 mg  650 mg Oral Q4H PRN Marinus Maw, MD      . apixaban Everlene Balls) tablet 2.5 mg  2.5 mg Oral BID Marinus Maw, MD   2.5 mg at 11/03/14 2156  . aspirin EC tablet 81 mg  81 mg Oral Daily Glenford Bayley, MD   81 mg at 11/03/14 0935  . atorvastatin (LIPITOR) tablet 40 mg  40 mg Oral QPM Glenford Bayley, MD   40 mg at 11/03/14 2156  . diltiazem (CARDIZEM) 100 mg in dextrose 5 % 100 mL (1 mg/mL) infusion  5 mg/hr Intravenous Titrated Quintella Reichert, MD 5 mL/hr at 11/01/14 0015 5 mg/hr at 11/01/14 0015  . fluticasone (FLONASE) 50 MCG/ACT nasal spray 1 spray  1 spray Each Nare Once PRN Glenford Bayley, MD      . ondansetron [facility]) injection 4 mg  4 mg Intravenous Q6H PRN Marinus Maw, MD      . pantoprazole (PROTONIX) EC tablet 40 mg  40 mg Oral Daily Glenford Bayley, MD   40 mg at 11/03/14 0935  . polyethylene glycol (MIRALAX / GLYCOLAX) packet 17 g  17 g Oral Manson Allan, MD   17 g at 11/02/14 1020  . sodium chloride 0.9 % injection 3 mL  3 mL Intravenous Q12H Marinus Maw, MD      . sodium chloride  0.9 % injection 3 mL  3 mL Intravenous PRN Marinus Maw, MD        PE: General appearance: alert, cooperative and no distress Neck: no carotid bruit and no JVD Lungs: clear to auscultation bilaterally Heart: regular rate and rhythm Extremities: no LEE Pulses: 2+ and symmetric Skin: warm and dry Neurologic: Grossly normal  Lab Results:   Recent Labs  11/02/14 0404 11/03/14 0450 11/04/14 0433  WBC 6.6 7.1 7.7  HGB 13.3 13.2 13.4  HCT 40.3 40.4 40.1  PLT 208 236 220   BMET  Recent Labs  11/02/14 0404 11/03/14 0450 11/04/14 0433  NA 140 140 140  K 4.0 4.3 3.9  CL 107 107 108  CO2 GLUCOSE 91 97 83  BUN CREATININE 0.75 0.75 0.57  CALCIUM 8.6* 9.0 8.6*   PT/INR No results for input(s): LABPROT, INR in the last 72 hours. Cholesterol  Recent Labs  11/03/14 0450  CHOL 161    Studies/Results: EP Study 11/03/14 CONCLUSIONS:  1. Sinus rhythm upon presentation.  2. The patient had dual AV nodal physiology with easily inducible classic AV nodal  reentrant tachycardia, there were no other accessory pathways or arrhythmias induced  3. Successful radiofrequency modification of the slow AV nodal pathway  4. No inducible arrhythmias following ablation.  5. No early apparent complications.    Assessment/Plan  Principal Problem:   SVT (supraventricular tachycardia) Active Problems:   TIA (transient ischemic attack)   1. SVT: EP study induced classic AV nodal reentrant tachycardia, s/p successful RF ablation 11/03/14.  2. TIA: continue Eliquis and statin.    LOS: 5 days    Brittainy M. Delmer Islam 11/04/2014 7:58 AM  I have seen and examined this patient with Fransico Michael.  Agree with above, note added to reflect my findings.  On exam, heart is regular, no wheezing.  AVNRT ablation yesterday went well without complication.  Can be discharged with anticoagulation and Jadon Ressler discuss with Dr. Ladona Ridgel about continuing at her next appointment.     Shanesha Bednarz M. Ernestina Joe MD 11/04/2014 11:51 AM

## 2014-11-28 DIAGNOSIS — Z7901 Long term (current) use of anticoagulants: Secondary | ICD-10-CM | POA: Insufficient documentation

## 2014-11-28 NOTE — Progress Notes (Signed)
Cardiology Office Note   Date:  11/28/2014   ID:  EDRIS SCHNECK, DOB Dec 29, 1928, MRN [id number]  PCP:  Sissy Hoff, MD  Cardiologist:  Lesleigh Noe, MD   Chief Complaint  Patient presents with  . Tachycardia    SVT  . Atrial Fibrillation      History of Present Illness: Ann Rodriguez is a 81 y.o. female who presents for PAF (unable to find documentation from all legal records) chronic Coumadin anticoagulation for greater than 15 years, PSVT, recent ablation by Dr. Rosette Reveal, and labile blood pressures.  Brittnye is doing well. She had a transient ischemic attack while hospitalized, prior to ablation for PSVT, after anticoagulation had been discontinued for several weeks. She was discharged from the hospital on both aspirin and Eliquis. She's had no recurrence of neurological complaints. She has not had syncope or chest pain. She has monitored her blood pressure closely at home and they have been labile. The great majority of pressures are less than 140/90 mmHg.    Past Medical History  Diagnosis Date  . GERD (gastroesophageal reflux disease)   . Arthritis   . A-fib   . Hyperlipidemia   . Depression     PT'S HUSBAND AND SON HAVE DIED W/IN PAST YEAR 08-07-2011  . Thyroid disease   . SVT (supraventricular tachycardia) 09/2014    Past Surgical History  Procedure Laterality Date  . Bunionectomy      left and right with hammer toe repair  . Appendectomy    . Oophorectomy    . Rotator cuff repair  1/13,  3/13    x 3  right/  . Foot surgery    . Salpingectomy    . Bladder surgery    . Nasal sinus surgery    . Knee arthroscopy  02/09/2011    Procedure: ARTHROSCOPY KNEE;  Surgeon: Jacki Cones;  Location: [facility];  Service: Orthopedics;  Laterality: Left;  Left Knee Arthroscopy with Menisectomy medial, abrasion chondroplasty medial, and synovectomy, supra patella pouch.  . Back surgery      lumbar  . Eye surgery      bilateral cataract extraction with IOL  . Total knee  arthroplasty  09/05/2011    Procedure: TOTAL KNEE ARTHROPLASTY;  Surgeon: Jacki Cones, MD;  Location: [facility];  Service: Orthopedics;  Laterality: Left;  . Shoulder open rotator cuff repair Left 06/26/2012    Procedure: LEFT SHOULDER ROTATOR CUFF REPAIR WITH ANCHORS ;  Surgeon: Jacki Cones, MD;  Location: [facility];  Service: Orthopedics;  Laterality: Left;  . Electrophysiologic study N/A 11/03/2014    Procedure: SVT Ablation;  Surgeon: Marinus Maw, MD;  Location: [facility] INVASIVE CV LAB;  Service: Cardiovascular;  Laterality: N/A;     Current Outpatient Prescriptions  Medication Sig Dispense Refill  . apixaban (ELIQUIS) 2.5 MG TABS tablet Take 1 tablet (2.5 mg total) by mouth 2 (two) times daily. 60 tablet 5  . aspirin EC 81 MG tablet Take 81 mg by mouth daily.    Marland Kitchen atorvastatin (LIPITOR) 40 MG tablet Take 40 mg by mouth every evening.     . beta carotene w/minerals (OCUVITE) tablet Take 1 tablet by mouth daily with breakfast.     . BIOTIN PO Take 1 tablet by mouth daily.    . calcium carbonate (OS-CAL) 600 MG TABS Take 1,800 mg by mouth daily with breakfast.     . cholecalciferol (VITAMIN D) 1000 UNITS tablet Take 1,000 Units by  mouth 2 (two) times daily.     Marland Kitchen esomeprazole (NEXIUM) 20 MG capsule Take 20 mg by mouth every morning.     . polyethylene glycol (MIRALAX / GLYCOLAX) packet Take 17 g by mouth every other day.      No current facility-administered medications for this visit.    Allergies:   Review of patient's allergies indicates no known allergies.    Social History:  The patient  reports that she quit smoking about 25 years ago. Her smoking use included Cigarettes. She smoked 0.00 packs per day for 0 years. She has never used smokeless tobacco. She reports that she does not drink alcohol or use illicit drugs.   Family History:  The patient's family history includes Diabetes in her mother and son.    ROS:  Please see the history of present illness.   Otherwise, review of  systems are positive for no recurrence of homonymous hemianopsia. Occasional dizziness and shortness of breath has occurred. Leg cramping has occurred..   All other systems are reviewed and negative.    PHYSICAL EXAM: VS:  There were no vitals taken for this visit. , BMI There is no weight on file to calculate BMI. GEN: Well nourished, well developed, in no acute distress HEENT: normal Neck: no JVD, carotid bruits, or masses Cardiac: RRR.  There is no murmur, rub, or gallop. There is no edema. Respiratory:  clear to auscultation bilaterally, normal work of breathing. GI: soft, nontender, nondistended, + BS MS: no deformity or atrophy Skin: warm and dry, no rash Neuro:  Strength and sensation are intact Psych: euthymic mood, full affect   EKG:  EKG is not ordered today.   Recent Labs: 10/31/2014: TSH 3.943 11/04/2014: BUN 11; Creatinine, Ser 0.57; Hemoglobin 13.4; Platelets 220; Potassium 3.9; Sodium 140    Lipid Panel    Component Value Date/Time   CHOL 161 11/03/2014 0450   TRIG 130 11/03/2014 0450   HDL 47 11/03/2014 0450   CHOLHDL 3.4 11/03/2014 0450   VLDL 26 11/03/2014 0450   LDLCALC 88 11/03/2014 0450      Wt Readings from Last 3 Encounters:  10/30/14 67.132 kg (148 lb)  10/30/14 67.132 kg (148 lb)  10/27/14 68.04 kg (150 lb)      Other studies Reviewed: Additional studies/ records that were reviewed today include: Reviewed the hospital stay and discharge summary.. The findings include still unclear to me how long to continue anticoagulation. Will discuss with Dr. Ladona Ridgel..    ASSESSMENT AND PLAN:  1. Paroxysmal atrial fibrillation Unable to find strips from past to support  2. SVT (supraventricular tachycardia) S/p ablation in August. No recurrence of PSVT. Had had multiple ER visits because of PSVT requiring adenosine to break.  3. Chronic diastolic heart failure No evidence of volume overload  4. Anticoagulation On Eliquis, low dose  5. Transient  cerebral ischemia, unspecified transient cerebral ischemia type Continue Eliquis for re-months and consider discontinuation    Current medicines are reviewed at length with the patient today.  The patient has the following concerns regarding medicines: Anticoagulation therapy. She wants to discontinue..  The following changes/actions have been instituted:    Continue current anticoagulation regimen for at least 3 months. I am concerned by her recent TIA. I would be interested in Dr. Lubertha Basque thoughts.  Consider discontinuation of Eliquis on return   Labs/ tests ordered today include:  No orders of the defined types were placed in this encounter.     Disposition:   FU  with HS in 3 months  Signed, Lesleigh Noe, MD  11/28/2014 6:45 PM    [facility] [address] [place], [place], [place]  [address] Phone: [phone]; Fax: [phone]

## 2014-11-29 ENCOUNTER — Ambulatory Visit (INDEPENDENT_AMBULATORY_CARE_PROVIDER_SITE_OTHER): Payer: Medicare Other | Admitting: Interventional Cardiology

## 2014-11-29 ENCOUNTER — Encounter: Payer: Self-pay | Admitting: Interventional Cardiology

## 2014-11-29 VITALS — BP 130/74 | HR 83 | Ht 66.0 in | Wt 149.8 lb

## 2014-11-29 DIAGNOSIS — I471 Supraventricular tachycardia, unspecified: Secondary | ICD-10-CM

## 2014-11-29 DIAGNOSIS — G459 Transient cerebral ischemic attack, unspecified: Secondary | ICD-10-CM

## 2014-11-29 DIAGNOSIS — I5032 Chronic diastolic (congestive) heart failure: Secondary | ICD-10-CM

## 2014-11-29 DIAGNOSIS — I480 Paroxysmal atrial fibrillation: Secondary | ICD-10-CM | POA: Diagnosis not present

## 2014-11-29 DIAGNOSIS — Z79899 Other long term (current) drug therapy: Secondary | ICD-10-CM

## 2014-11-29 DIAGNOSIS — Z5181 Encounter for therapeutic drug level monitoring: Secondary | ICD-10-CM | POA: Diagnosis not present

## 2014-11-29 DIAGNOSIS — Z7901 Long term (current) use of anticoagulants: Secondary | ICD-10-CM

## 2014-11-29 NOTE — Patient Instructions (Signed)
Medication Instructions:  Your physician recommends that you continue on your current medications as directed. Please refer to the Current Medication list given to you today.   Labwork: None ordered  Testing/Procedures: None ordered  Follow-Up: You have a follow up appointment scheduled on 03/01/15 @ 9:30am  Any Other Special Instructions Will Be Listed Below (If Applicable).

## 2014-12-01 ENCOUNTER — Encounter: Payer: Self-pay | Admitting: Internal Medicine

## 2014-12-01 ENCOUNTER — Ambulatory Visit (INDEPENDENT_AMBULATORY_CARE_PROVIDER_SITE_OTHER): Payer: Medicare Other | Admitting: Internal Medicine

## 2014-12-01 VITALS — BP 106/70 | HR 80 | Ht 66.0 in | Wt 148.2 lb

## 2014-12-01 DIAGNOSIS — I471 Supraventricular tachycardia, unspecified: Secondary | ICD-10-CM

## 2014-12-01 DIAGNOSIS — I480 Paroxysmal atrial fibrillation: Secondary | ICD-10-CM | POA: Diagnosis not present

## 2014-12-01 NOTE — Assessment & Plan Note (Signed)
She is s/p ablation of incessant AVNRT. She has had no additional symptoms. She is off of all AV nodal blocking drugs. As she has no recurrent TIA symptoms, will hold off on systemic anti-coagulation. She will continue ASA. I suspect her TIA was caused by incessant SVT at 200/min.

## 2014-12-01 NOTE — Progress Notes (Signed)
HPI Ann Rodriguez returns today for followup. She is a pleasant 81 yo woman with a h/o SVT who had a TIA in the setting of incessant SVT. She was place on Eliquis. She underwent EP study and catheter ablation several weeks ago. In the interim, she has done well. No chest pain or sob. No SVT.  No Known Allergies   Current Outpatient Prescriptions  Medication Sig Dispense Refill  . aspirin EC 81 MG tablet Take 81 mg by mouth daily.    Marland Kitchen atorvastatin (LIPITOR) 40 MG tablet Take 40 mg by mouth every evening.     . beta carotene w/minerals (OCUVITE) tablet Take 1 tablet by mouth daily with breakfast.     . BIOTIN PO Take 1 tablet by mouth daily.    . calcium carbonate (OS-CAL) 600 MG TABS Take 1,800 mg by mouth daily with breakfast.     . cholecalciferol (VITAMIN D) 1000 UNITS tablet Take 1,000 Units by mouth daily.     Marland Kitchen esomeprazole (NEXIUM) 20 MG capsule Take 20 mg by mouth every morning.     . polyethylene glycol (MIRALAX / GLYCOLAX) packet Take 17 g by mouth every other day.      No current facility-administered medications for this visit.     Past Medical History  Diagnosis Date  . GERD (gastroesophageal reflux disease)   . Arthritis   . A-fib   . Hyperlipidemia   . Depression     PT'S HUSBAND AND SON HAVE DIED W/IN PAST YEAR 07/22/11  . Thyroid disease   . SVT (supraventricular tachycardia) 09/2014    ROS:   All systems reviewed and negative except as noted in the HPI.   Past Surgical History  Procedure Laterality Date  . Bunionectomy      left and right with hammer toe repair  . Appendectomy    . Oophorectomy    . Rotator cuff repair  1/13,  3/13    x 3  right/  . Foot surgery    . Salpingectomy    . Bladder surgery    . Nasal sinus surgery    . Knee arthroscopy  02/09/2011    Procedure: ARTHROSCOPY KNEE;  Surgeon: Jacki Cones;  Location: [facility];  Service: Orthopedics;  Laterality: Left;  Left Knee Arthroscopy with Menisectomy medial, abrasion  chondroplasty medial, and synovectomy, supra patella pouch.  . Back surgery      lumbar  . Eye surgery      bilateral cataract extraction with IOL  . Total knee arthroplasty  09/05/2011    Procedure: TOTAL KNEE ARTHROPLASTY;  Surgeon: Jacki Cones, MD;  Location: [facility];  Service: Orthopedics;  Laterality: Left;  . Shoulder open rotator cuff repair Left 06/26/2012    Procedure: LEFT SHOULDER ROTATOR CUFF REPAIR WITH ANCHORS ;  Surgeon: Jacki Cones, MD;  Location: [facility];  Service: Orthopedics;  Laterality: Left;  . Electrophysiologic study N/A 11/03/2014    Procedure: SVT Ablation;  Surgeon: Marinus Maw, MD;  Location: [facility] INVASIVE CV LAB;  Service: Cardiovascular;  Laterality: N/A;     Family History  Problem Relation Age of Onset  . Diabetes Mother   . Diabetes Son      Social History   Social History  . Marital Status: Widowed    Spouse Name: N/A  . Number of Children: N/A  . Years of Education: N/A   Occupational History  . Not on file.   Social History Main Topics  .  Smoking status: Former Smoker -- 0.00 packs/day for 0 years    Types: Cigarettes    Quit date: 02/04/1974  . Smokeless tobacco: Never Used  . Alcohol Use: No     Comment: socially  . Drug Use: No  . Sexual Activity: Not on file   Other Topics Concern  . Not on file   Social History Narrative     BP 106/70 mmHg  Pulse 80  Ht  (1.676 m)  Wt 148 lb 3.2 oz (67.223 kg)  BMI 23.93 kg/m2  Physical Exam:  Well appearing elderly woman, NAD HEENT: Unremarkable Neck:  No JVD, no thyromegally Lymphatics:  No adenopathy Back:  No CVA tenderness Lungs:  Clear HEART:  Regular rate rhythm, no murmurs, no rubs, no clicks Abd:  soft, positive bowel sounds, no organomegally, no rebound, no guarding Ext:  2 plus pulses, no edema, no cyanosis, no clubbing Skin:  No rashes no nodules Neuro:  CN II through XII intact, motor grossly intact  EKG - NSR with normal axis and intervals.  DEVICE   Normal device function.  See PaceArt for details.   Assess/Plan:

## 2014-12-01 NOTE — Patient Instructions (Signed)
Medication Instructions: 1) Stop Eliquis  Labwork: - none  Procedures/Testing: - none  Follow-Up: - Dr. Ladona Ridgel will see you back on an as needed basis.  Any Additional Special Instructions Will Be Listed Below (If Applicable). - none

## 2014-12-01 NOTE — Assessment & Plan Note (Signed)
We have not been able to find any strips of this. She will stop Eliquis. I suspect her TIA was related to SVT. She will undergo watchful waiting.

## 2014-12-23 ENCOUNTER — Ambulatory Visit (INDEPENDENT_AMBULATORY_CARE_PROVIDER_SITE_OTHER): Payer: Medicare Other | Admitting: Neurology

## 2014-12-23 ENCOUNTER — Encounter: Payer: Self-pay | Admitting: Neurology

## 2014-12-23 VITALS — BP 139/85 | HR 85 | Ht 66.0 in | Wt 150.4 lb

## 2014-12-23 DIAGNOSIS — G459 Transient cerebral ischemic attack, unspecified: Secondary | ICD-10-CM

## 2014-12-23 NOTE — Progress Notes (Signed)
[facility] [address] [place]. [place] [address] [phone]       OFFICE FOLLOW-UP NOTE  Ann. Ann Rodriguez Date of Birth:  01/20/1929 Medical Record Number:  [id number]   HPI: Ann Rodriguez is a 79 year Caucasian lady seen today for follow-up following [facility] consultation for TIA in August 2016. Ann Rodriguez is an 81 y.o. female with history of Afib who presented to the hospital due to recurrent SVT. She has failed medical therapy with flecainide and metoprolol and is currently in hospital for a ablation. While in hospital patient had a 10 minute episode which she could not see a small portion of her left visual field but not in a dense field cut or quadrant aspect. "just a spot in her vision". She did close each eye and it was present in both eyes. It was also noted she had difficulty finishing her sentences due to word finding difficulty. Currently all symptoms have resolved and cardiology would like a consult prior to ablation.  Date last known well: Date: 11/01/2014 Time last known well: Time: 15:58 tPA Given: No: symptoms resolved. CT scan of the head showed no acute abnormality and MRI scan also did not show definite acute stroke. Transthoracic echo on 09/29/14 showing ejection fraction 55-60%. LDL cholesterol was 88 mg percent. Carotid ultrasound showed no significant extracranial stenosis. The patient had been on warfarin for 15 years for atrial fibrillation but Dr. Ladona Ridgel electrophysiologist reviewed patient's past EKG strips and did not find definite evidence of documented A. fib and hence anticoagulation with eliquis was discontinued recently and patient was advised to stay on aspirin alone. She states she's done well she's not had recurrent TIA or stroke symptoms. She is tolerating Lipitor well without muscle aches or pains. He states blood pressure has tended to run higher after her ablation and today it is 139/85. ROS:   14 system review of systems is  positive for  runny nose, shortness of breath, incontinence of bladder, numbness, muscle cramps and all other systems negative PMH:  Past Medical History  Diagnosis Date  . GERD (gastroesophageal reflux disease)   . Arthritis   . A-fib (HCC)   . Hyperlipidemia   . Depression     PT'S HUSBAND AND SON HAVE DIED W/IN PAST YEAR 07/26/11  . Thyroid disease   . SVT (supraventricular tachycardia) (HCC) 09/2014  . Hearing loss     Social History:  Social History   Social History  . Marital Status: Widowed    Spouse Name: N/A  . Number of Children: N/A  . Years of Education: N/A   Occupational History  . Not on file.   Social History Main Topics  . Smoking status: Former Smoker -- 0.00 packs/day for 0 years    Types: Cigarettes    Quit date: 02/04/1974  . Smokeless tobacco: Never Used  . Alcohol Use: 0.6 oz/week    0 Standard drinks or equivalent, 1 Glasses of wine per week     Comment: socially  . Drug Use: No  . Sexual Activity: Not on file   Other Topics Concern  . Not on file   Social History Narrative    Medications:   Current Outpatient Prescriptions on File Prior to Visit  Medication Sig Dispense Refill  . aspirin EC 81 MG tablet Take 81 mg by mouth daily.    Marland Kitchen atorvastatin (LIPITOR) 40 MG tablet Take 40 mg by mouth every evening.     . beta  carotene w/minerals (OCUVITE) tablet Take 1 tablet by mouth daily with breakfast.     . BIOTIN PO Take 1 tablet by mouth daily.    . calcium carbonate (OS-CAL) 600 MG TABS Take 1,800 mg by mouth daily with breakfast.     . cholecalciferol (VITAMIN D) 1000 UNITS tablet Take 1,000 Units by mouth daily.     Marland Kitchen. esomeprazole (NEXIUM) 20 MG capsule Take 20 mg by mouth every morning.     . polyethylene glycol (MIRALAX / GLYCOLAX) packet Take 17 g by mouth every other day.      No current facility-administered medications on file prior to visit.    Allergies:  No Known Allergies  Physical Exam General: well developed, well nourished  elderly Caucasian lady, seated, in no evident distress Head: head normocephalic and atraumatic.  Neck: supple with no carotid or supraclavicular bruits Cardiovascular: regular rate and rhythm, no murmurs Musculoskeletal: no deformity. Mild kyphoscoliosis Skin:  no rash/petichiae Vascular:  Normal pulses all extremities Filed Vitals:   12/23/14 1113  BP: 139/85  Pulse: 85   Neurologic Exam Mental Status: Awake and fully alert. Oriented to place and time. Recent and remote memory intact. Attention span, concentration and fund of knowledge appropriate. Mood and affect appropriate.  Cranial Nerves: Fundoscopic exam reveals sharp disc margins. Pupils equal, briskly reactive to light. Extraocular movements full without nystagmus. Visual fields full to confrontation. Hearing intact. Facial sensation intact. Face, tongue, palate moves normally and symmetrically.  Motor: Normal bulk and tone. Normal strength in all tested extremity muscles. Sensory.: intact to touch ,pinprick .position and vibratory sensation.  Coordination: Rapid alternating movements normal in all extremities. Finger-to-nose and heel-to-shin performed accurately bilaterally. Gait and Station: Arises from chair without difficulty. Stance is normal. Gait demonstrates normal stride length and balance . Not able to heel, toe and tandem walk without difficulty.  Reflexes: 1+ and symmetric. Toes downgoing.   NIHSS 0 Modified Rankin  0   ASSESSMENT: 81 year with TIA in August 2016 following elective ablation for supraventricular tachycardia. Vascular risk factors of hyperlipidemia and borderline hypertension. She had long-standing history of supposedly had atrial fibrillation for which she was on warfarin but recently this diagnosis was clarified to supraventricular tachycardia    PLAN: I had a long d/w patient about her recent TIA risk for recurrent stroke/TIAs, personally independently reviewed imaging studies and stroke  evaluation results and answered questions.Continue aspirin 81 mg orally every day  for secondary stroke prevention and maintain strict control of hypertension with blood pressure goal below 130/90, diabetes with hemoglobin A1c goal below 6.5% and lipids with LDL cholesterol goal below 100 mg/dL. I also advised the patient to eat a healthy diet with plenty of whole grains, cereals, fruits and vegetables, exercise regularly and maintain ideal body weightGreater than 50% of time during this 25 minute visit was spent on counseling,explanation of diagnosis, planning of further management, discussion with patient and family and coordination of care . Followup in the future with me in  6 months or call earlier if necessary. Delia HeadyPramod Sethi, MD  Note: This document was prepared with digital dictation and possible smart phrase technology. Any transcriptional errors that result from this process are unintentional

## 2014-12-23 NOTE — Patient Instructions (Signed)
I had a long d/w patient about her recent TIA risk for recurrent stroke/TIAs, personally independently reviewed imaging studies and stroke evaluation results and answered questions.Continue aspirin 81 mg orally every day  for secondary stroke prevention and maintain strict control of hypertension with blood pressure goal below 130/90, diabetes with hemoglobin A1c goal below 6.5% and lipids with LDL cholesterol goal below 100 mg/dL. I also advised the patient to eat a healthy diet with plenty of whole grains, cereals, fruits and vegetables, exercise regularly and maintain ideal body weight Followup in the future with me in  6 months or call earlier if necessary. DASH Eating Plan DASH stands for "Dietary Approaches to Stop Hypertension." The DASH eating plan is a healthy eating plan that has been shown to reduce high blood pressure (hypertension). Additional health benefits may include reducing the risk of type 2 diabetes mellitus, heart disease, and stroke. The DASH eating plan may also help with weight loss. WHAT DO I NEED TO KNOW ABOUT THE DASH EATING PLAN? For the DASH eating plan, you will follow these general guidelines:  Choose foods with a percent daily value for sodium of less than 5% (as listed on the food label).  Use salt-free seasonings or herbs instead of table salt or sea salt.  Check with your health care provider or pharmacist before using salt substitutes.  Eat lower-sodium products, often labeled as "lower sodium" or "no salt added."  Eat fresh foods.  Eat more vegetables, fruits, and low-fat dairy products.  Choose whole grains. Look for the word "whole" as the first word in the ingredient list.  Choose fish and skinless chicken or [place] more often than red meat. Limit fish, poultry, and meat to 6 oz (170 g) each day.  Limit sweets, desserts, sugars, and sugary drinks.  Choose heart-healthy fats.  Limit cheese to 1 oz (28 g) per day.  Eat more home-cooked food and less  restaurant, buffet, and fast food.  Limit fried foods.  Cook foods using methods other than frying.  Limit canned vegetables. If you do use them, rinse them well to decrease the sodium.  When eating at a restaurant, ask that your food be prepared with less salt, or no salt if possible. WHAT FOODS CAN I EAT? Seek help from a dietitian for individual calorie needs. Grains Whole grain or whole wheat bread. Brown rice. Whole grain or whole wheat pasta. Quinoa, bulgur, and whole grain cereals. Low-sodium cereals. Corn or whole wheat flour tortillas. Whole grain cornbread. Whole grain crackers. Low-sodium crackers. Vegetables Fresh or frozen vegetables (raw, steamed, roasted, or grilled). Low-sodium or reduced-sodium tomato and vegetable juices. Low-sodium or reduced-sodium tomato sauce and paste. Low-sodium or reduced-sodium canned vegetables.  Fruits All fresh, canned (in natural juice), or frozen fruits. Meat and Other Protein Products Ground beef (85% or leaner), grass-fed beef, or beef trimmed of fat. Skinless chicken or [place]. Ground chicken or [place]. Pork trimmed of fat. All fish and seafood. Eggs. Dried beans, peas, or lentils. Unsalted nuts and seeds. Unsalted canned beans. Dairy Low-fat dairy products, such as skim or 1% milk, 2% or reduced-fat cheeses, low-fat ricotta or cottage cheese, or plain low-fat yogurt. Low-sodium or reduced-sodium cheeses. Fats and Oils Tub margarines without trans fats. Light or reduced-fat mayonnaise and salad dressings (reduced sodium). Avocado. Safflower, olive, or canola oils. Natural peanut or almond butter. Other Unsalted popcorn and pretzels. The items listed above may not be a complete list of recommended foods or beverages. Contact your dietitian for more options.  WHAT FOODS ARE NOT RECOMMENDED? Grains White bread. White pasta. White rice. Refined cornbread. Bagels and croissants. Crackers that contain trans fat. Vegetables Creamed or fried  vegetables. Vegetables in a cheese sauce. Regular canned vegetables. Regular canned tomato sauce and paste. Regular tomato and vegetable juices. Fruits Dried fruits. Canned fruit in light or heavy syrup. Fruit juice. Meat and Other Protein Products Fatty cuts of meat. Ribs, chicken wings, bacon, sausage, bologna, salami, chitterlings, fatback, hot dogs, bratwurst, and packaged luncheon meats. Salted nuts and seeds. Canned beans with salt. Dairy Whole or 2% milk, cream, half-and-half, and cream cheese. Whole-fat or sweetened yogurt. Full-fat cheeses or blue cheese. Nondairy creamers and whipped toppings. Processed cheese, cheese spreads, or cheese curds. Condiments Onion and garlic salt, seasoned salt, table salt, and sea salt. Canned and packaged gravies. Worcestershire sauce. Tartar sauce. Barbecue sauce. Teriyaki sauce. Soy sauce, including reduced sodium. Steak sauce. Fish sauce. Oyster sauce. Cocktail sauce. Horseradish. Ketchup and mustard. Meat flavorings and tenderizers. Bouillon cubes. Hot sauce. Tabasco sauce. Marinades. Taco seasonings. Relishes. Fats and Oils Butter, stick margarine, lard, shortening, ghee, and bacon fat. Coconut, palm kernel, or palm oils. Regular salad dressings. Other Pickles and olives. Salted popcorn and pretzels. The items listed above may not be a complete list of foods and beverages to avoid. Contact your dietitian for more information. WHERE CAN I FIND MORE INFORMATION? National Heart, Lung, and Blood Institute: CablePromo.itwww.nhlbi.nih.gov/health/health-topics/topics/dash/   This information is not intended to replace advice given to you by your health care provider. Make sure you discuss any questions you have with your health care provider.   Document Released: 02/15/2011 Document Revised: 03/19/2014 Document Reviewed: 12/31/2012 Elsevier Interactive Patient Education [employer]2016 Elsevier Inc.

## 2015-02-28 NOTE — Progress Notes (Signed)
Cardiology Office Note   Date:  03/01/2015   ID:  Ann Rodriguez, DOB October 27, 1928, MRN [id number]  PCP:  Sissy Hoff, MD  Cardiologist:  Lesleigh Noe, MD   Chief Complaint  Patient presents with  . Tachycardia      History of Present Illness: Ann Rodriguez is a 81 y.o. female who presents for follow-up of SVT s/p ablation, possible PAF(remote) without documentation on ECG, hypertension, and disastolic dysfunction. After SVT ablation, flecainide and anticoagulation was discontinued.  She has been asymptomatic since ablation. She has occasional palpitation/skipped beat. She has had no sustained arrhythmia. She is now off all cardioactive medications.  Past Medical History  Diagnosis Date  . GERD (gastroesophageal reflux disease)   . Arthritis   . A-fib (HCC)   . Hyperlipidemia   . Depression     PT'S HUSBAND AND SON HAVE DIED W/IN PAST YEAR 07-14-11  . Thyroid disease   . SVT (supraventricular tachycardia) (HCC) 09/2014  . Hearing loss     Past Surgical History  Procedure Laterality Date  . Bunionectomy      left and right with hammer toe repair  . Appendectomy    . Oophorectomy    . Rotator cuff repair  1/13,  3/13    x 3  right/  . Foot surgery    . Salpingectomy    . Bladder surgery    . Nasal sinus surgery    . Knee arthroscopy  02/09/2011    Procedure: ARTHROSCOPY KNEE;  Surgeon: Jacki Cones;  Location: [facility];  Service: Orthopedics;  Laterality: Left;  Left Knee Arthroscopy with Menisectomy medial, abrasion chondroplasty medial, and synovectomy, supra patella pouch.  . Back surgery      lumbar  . Eye surgery      bilateral cataract extraction with IOL  . Total knee arthroplasty  09/05/2011    Procedure: TOTAL KNEE ARTHROPLASTY;  Surgeon: Jacki Cones, MD;  Location: [facility];  Service: Orthopedics;  Laterality: Left;  . Shoulder open rotator cuff repair Left 06/26/2012    Procedure: LEFT SHOULDER ROTATOR CUFF REPAIR WITH ANCHORS ;  Surgeon: Jacki Cones, MD;  Location: [facility];  Service: Orthopedics;  Laterality: Left;  . Electrophysiologic study N/A 11/03/2014    Procedure: SVT Ablation;  Surgeon: Marinus Maw, MD;  Location: [facility] INVASIVE CV LAB;  Service: Cardiovascular;  Laterality: N/A;     Current Outpatient Prescriptions  Medication Sig Dispense Refill  . aspirin EC 81 MG tablet Take 81 mg by mouth daily.    Marland Kitchen atorvastatin (LIPITOR) 40 MG tablet Take 40 mg by mouth every evening.     . beta carotene w/minerals (OCUVITE) tablet Take 1 tablet by mouth daily with breakfast.     . BIOTIN PO Take 1 tablet by mouth daily.    . calcium carbonate (OS-CAL) 600 MG TABS Take 1,800 mg by mouth daily with breakfast.     . cholecalciferol (VITAMIN D) 1000 UNITS tablet Take 1,000 Units by mouth daily.     Marland Kitchen esomeprazole (NEXIUM) 20 MG capsule Take 20 mg by mouth every morning.     . polyethylene glycol (MIRALAX / GLYCOLAX) packet Take 17 g by mouth every other day.      No current facility-administered medications for this visit.    Allergies:   Review of patient's allergies indicates no known allergies.    Social History:  The patient  reports that she quit smoking about 41 years ago.  Her smoking use included Cigarettes. She smoked 0.00 packs per day for 0 years. She has never used smokeless tobacco. She reports that she drinks about 0.6 oz of alcohol per week. She reports that she does not use illicit drugs.   Family History:  The patient's family history includes Diabetes in her mother and son.    ROS:  Please see the history of present illness.   Otherwise, review of systems are positive for dizziness, dyspnea on exertion, and fatigue. Otherwise no complaints..   All other systems are reviewed and negative.    PHYSICAL EXAM: VS:  BP 140/84 mmHg  Pulse 68  Ht 5\' 6"  (1.676 m)  Wt 149 lb 1.9 oz (67.64 kg)  BMI 24.08 kg/m2  SpO2 99% , BMI Body mass index is 24.08 kg/(m^2). GEN: Well nourished, well developed, in no acute  distress HEENT: normal Neck: no JVD, carotid bruits, or masses Cardiac: RRR.  There is no murmur, rub, or gallop. There is no edema. Respiratory:  clear to auscultation bilaterally, normal work of breathing. GI: soft, nontender, nondistended, + BS MS: no deformity or atrophy Skin: warm and dry, no rash Neuro:  Strength and sensation are intact Psych: euthymic mood, full affect   EKG:  EKG is not ordered today.    Recent Labs: 10/31/2014: TSH 3.943 11/04/2014: BUN 11; Creatinine, Ser 0.57; Hemoglobin 13.4; Platelets 220; Potassium 3.9; Sodium 140    Lipid Panel    Component Value Date/Time   CHOL 161 11/03/2014 0450   TRIG 130 11/03/2014 0450   HDL 47 11/03/2014 0450   CHOLHDL 3.4 11/03/2014 0450   VLDL 26 11/03/2014 0450   LDLCALC 88 11/03/2014 0450      Wt Readings from Last 3 Encounters:  03/01/15 149 lb 1.9 oz (67.64 kg)  12/23/14 150 lb 6.4 oz (68.221 kg)  12/01/14 148 lb 3.2 oz (67.223 kg)      Other studies Reviewed: Additional studies/ records that were reviewed today include: Prior EKGs. The findings include no new or surprising data is found..    ASSESSMENT AND PLAN:  1. SVT (supraventricular tachycardia) (HCC) Episodes have resolved since ablation  2. Chronic diastolic heart failure (HCC) Exertional dyspnea is likely related to this problem.  3. Paroxysmal atrial fibrillation (HCC) Never substantiated by EKG tracings.  4. Encounter for monitoring flecainide therapy Discontinued  5. Chronic anticoagulation Discontinued  6. Cerebral infarction due to embolism of precerebral artery (HCC) No new complaint  7. Dyspnea on effort Unchanged since last office visit despite ablation of periodic SVT    Current medicines are reviewed at length with the patient today.  The patient has the following concerns regarding medicines: none.  The following changes/actions have been instituted:    Observe for possible recurrence of SVT or atrial  fibrillation  Labs/ tests ordered today include:  No orders of the defined types were placed in this encounter.     Disposition:   FU with HS in 1 year  Signed, Lesleigh NoeSMITH III,HENRY W, MD  03/01/2015 9:39 AM    [facility] [address], [place], [place]  [address] Phone: [phone]; Fax: [phone]

## 2015-03-01 ENCOUNTER — Encounter: Payer: Self-pay | Admitting: Interventional Cardiology

## 2015-03-01 ENCOUNTER — Ambulatory Visit (INDEPENDENT_AMBULATORY_CARE_PROVIDER_SITE_OTHER): Payer: Medicare Other | Admitting: Interventional Cardiology

## 2015-03-01 VITALS — BP 140/84 | HR 68 | Ht 66.0 in | Wt 149.1 lb

## 2015-03-01 DIAGNOSIS — I6310 Cerebral infarction due to embolism of unspecified precerebral artery: Secondary | ICD-10-CM

## 2015-03-01 DIAGNOSIS — Z79899 Other long term (current) drug therapy: Secondary | ICD-10-CM

## 2015-03-01 DIAGNOSIS — I471 Supraventricular tachycardia: Secondary | ICD-10-CM | POA: Diagnosis not present

## 2015-03-01 DIAGNOSIS — Z5181 Encounter for therapeutic drug level monitoring: Secondary | ICD-10-CM | POA: Diagnosis not present

## 2015-03-01 DIAGNOSIS — I480 Paroxysmal atrial fibrillation: Secondary | ICD-10-CM

## 2015-03-01 DIAGNOSIS — R0609 Other forms of dyspnea: Secondary | ICD-10-CM

## 2015-03-01 DIAGNOSIS — I5032 Chronic diastolic (congestive) heart failure: Secondary | ICD-10-CM

## 2015-03-01 DIAGNOSIS — Z7901 Long term (current) use of anticoagulants: Secondary | ICD-10-CM

## 2015-03-01 DIAGNOSIS — R0600 Dyspnea, unspecified: Secondary | ICD-10-CM

## 2015-03-01 NOTE — Patient Instructions (Signed)

## 2015-03-10 NOTE — Telephone Encounter (Signed)
Created in ERROR.

## 2015-03-13 HISTORY — PX: CARPAL TUNNEL RELEASE: SHX101

## 2015-05-06 DIAGNOSIS — H6120 Impacted cerumen, unspecified ear: Secondary | ICD-10-CM | POA: Diagnosis not present

## 2015-05-12 DIAGNOSIS — H5212 Myopia, left eye: Secondary | ICD-10-CM | POA: Diagnosis not present

## 2015-05-12 DIAGNOSIS — H43811 Vitreous degeneration, right eye: Secondary | ICD-10-CM | POA: Diagnosis not present

## 2015-05-12 DIAGNOSIS — H353121 Nonexudative age-related macular degeneration, left eye, early dry stage: Secondary | ICD-10-CM | POA: Diagnosis not present

## 2015-05-12 DIAGNOSIS — Z961 Presence of intraocular lens: Secondary | ICD-10-CM | POA: Diagnosis not present

## 2015-05-12 DIAGNOSIS — H5201 Hypermetropia, right eye: Secondary | ICD-10-CM | POA: Diagnosis not present

## 2015-05-12 DIAGNOSIS — H52223 Regular astigmatism, bilateral: Secondary | ICD-10-CM | POA: Diagnosis not present

## 2015-05-12 DIAGNOSIS — H353111 Nonexudative age-related macular degeneration, right eye, early dry stage: Secondary | ICD-10-CM | POA: Diagnosis not present

## 2015-05-30 DIAGNOSIS — N3001 Acute cystitis with hematuria: Secondary | ICD-10-CM | POA: Diagnosis not present

## 2015-05-30 DIAGNOSIS — R350 Frequency of micturition: Secondary | ICD-10-CM | POA: Diagnosis not present

## 2015-06-09 DIAGNOSIS — M79672 Pain in left foot: Secondary | ICD-10-CM | POA: Diagnosis not present

## 2015-06-09 DIAGNOSIS — G5602 Carpal tunnel syndrome, left upper limb: Secondary | ICD-10-CM | POA: Diagnosis not present

## 2015-06-09 DIAGNOSIS — G609 Hereditary and idiopathic neuropathy, unspecified: Secondary | ICD-10-CM | POA: Diagnosis not present

## 2015-06-09 DIAGNOSIS — M79671 Pain in right foot: Secondary | ICD-10-CM | POA: Diagnosis not present

## 2015-06-09 DIAGNOSIS — G5603 Carpal tunnel syndrome, bilateral upper limbs: Secondary | ICD-10-CM | POA: Diagnosis not present

## 2015-06-09 DIAGNOSIS — G5601 Carpal tunnel syndrome, right upper limb: Secondary | ICD-10-CM | POA: Diagnosis not present

## 2015-06-24 ENCOUNTER — Encounter: Payer: Self-pay | Admitting: Neurology

## 2015-06-24 ENCOUNTER — Ambulatory Visit (INDEPENDENT_AMBULATORY_CARE_PROVIDER_SITE_OTHER): Payer: Medicare Other | Admitting: Neurology

## 2015-06-24 VITALS — BP 118/75 | HR 61 | Ht 66.0 in | Wt 148.0 lb

## 2015-06-24 DIAGNOSIS — I6990 Unspecified sequelae of unspecified cerebrovascular disease: Secondary | ICD-10-CM

## 2015-06-24 NOTE — Patient Instructions (Signed)
I had a long d/w patient about her remote TIA,e, risk for recurrent stroke/TIAs, personally independently reviewed imaging studies and stroke evaluation results and answered questions.Continue aspirin 81 mg daily  for secondary stroke prevention and maintain strict control of hypertension with blood pressure goal below 130/90, diabetes with hemoglobin A1c goal below 6.5% and lipids with LDL cholesterol goal below 70 mg/dL. I also advised the patient to eat a healthy diet with plenty of whole grains, cereals, fruits and vegetables, exercise regularly and maintain ideal body weight Followup in the future with me in 1 year or call earlier if necessary

## 2015-06-24 NOTE — Progress Notes (Signed)
[facility] [address] [place]. [place] [address] [phone]       OFFICE FOLLOW-UP NOTE  Ann. Ann Rodriguez Date of Birth:  17-May-1928 Medical Record Number:  [id number]   HPI: Ann Rodriguez is a 18 year Caucasian lady seen today for follow-up following [facility] consultation for TIA in August 2016. Ann Rodriguez is an 82 y.o. female with history of Afib who presented to the hospital due to recurrent SVT. She has failed medical therapy with flecainide and metoprolol and is currently in hospital for a ablation. While in hospital patient had a 10 minute episode which she could not see a small portion of her left visual field but not in a dense field cut or quadrant aspect. "just a spot in her vision". She did close each eye and it was present in both eyes. It was also noted she had difficulty finishing her sentences due to word finding difficulty. Currently all symptoms have resolved and cardiology would like a consult prior to ablation.  Date last known well: Date: 11/01/2014 Time last known well: Time: 15:58 tPA Given: No: symptoms resolved. CT scan of the head showed no acute abnormality and MRI scan also did not show definite acute stroke. Transthoracic echo on 09/29/14 showing ejection fraction 55-60%. LDL cholesterol was 88 mg percent. Carotid ultrasound showed no significant extracranial stenosis. The patient had been on warfarin for 15 years for atrial fibrillation but Dr. Ladona Ridgel electrophysiologist reviewed patient's past EKG strips and did not find definite evidence of documented A. fib and hence anticoagulation with eliquis was discontinued recently and patient was advised to stay on aspirin alone. She states she's done well she's not had recurrent TIA or stroke symptoms. She is tolerating Lipitor well without muscle aches or pains. He states blood pressure has tended to run higher after her ablation and today it is 139/85. Update 06/24/15 : She returns for  follow-up after last visit 6 months ago. She continues to do well. She has had no recurrent stroke or TIA symptoms. She is tolerating aspirin 81 mg well without needing, bruising or other side effects. She states her blood pressure is quite well controlled and today it is 118/75. She remains on Lipitor which is tolerating well without myalgias or arthralgias. She had lipid profile checked a few weeks ago by her family physician and it was satisfactory. She has had no new health problems. She has no new neurological complaints today. ROS:   14 system review of systems is positive for  shortness of breath, incontinence of bladder, frequent waking, muscle cramps, numbness and all other systems negative PMH:  Past Medical History  Diagnosis Date  . GERD (gastroesophageal reflux disease)   . Arthritis   . Hyperlipidemia   . Depression     PT'S HUSBAND AND SON HAVE DIED W/IN PAST YEAR 27-Jul-2011  . Thyroid disease   . SVT (supraventricular tachycardia) (HCC) 09/2014  . Hearing loss   . Stroke [facility])     Social History:  Social History   Social History  . Marital Status: Widowed    Spouse Name: N/A  . Number of Children: N/A  . Years of Education: N/A   Occupational History  . Not on file.   Social History Main Topics  . Smoking status: Former Smoker -- 0.00 packs/day for 0 years    Types: Cigarettes    Quit date: 02/04/1974  . Smokeless tobacco: Never Used  . Alcohol Use: 0.6 oz/week  1 Glasses of wine, 0 Standard drinks or equivalent per week     Comment: socially  . Drug Use: No  . Sexual Activity: Not on file   Other Topics Concern  . Not on file   Social History Narrative    Medications:   Current Outpatient Prescriptions on File Prior to Visit  Medication Sig Dispense Refill  . aspirin EC 81 MG tablet Take 81 mg by mouth daily.    Marland Kitchen. atorvastatin (LIPITOR) 40 MG tablet Take 40 mg by mouth every evening.     . beta carotene w/minerals (OCUVITE) tablet Take 1 tablet by mouth  daily with breakfast.     . BIOTIN PO Take 1 tablet by mouth daily.    . calcium carbonate (OS-CAL) 600 MG TABS Take 1,800 mg by mouth daily with breakfast.     . cholecalciferol (VITAMIN D) 1000 UNITS tablet Take 1,000 Units by mouth daily.     Marland Kitchen. esomeprazole (NEXIUM) 20 MG capsule Take 20 mg by mouth every morning.     . polyethylene glycol (MIRALAX / GLYCOLAX) packet Take 17 g by mouth every other day.      No current facility-administered medications on file prior to visit.    Allergies:  No Known Allergies  Physical Exam General: well developed, well nourished elderly Caucasian lady, seated, in no evident distress Head: head normocephalic and atraumatic.  Neck: supple with no carotid or supraclavicular bruits Cardiovascular: regular rate and rhythm, no murmurs Musculoskeletal: no deformity. Mild kyphoscoliosis Skin:  no rash/petichiae Vascular:  Normal pulses all extremities Filed Vitals:   06/24/15 0822  BP: 118/75  Pulse: 61   Neurologic Exam Mental Status: Awake and fully alert. Oriented to place and time. Recent and remote memory intact. Attention span, concentration and fund of knowledge appropriate. Mood and affect appropriate.  Cranial Nerves: Fundoscopic exam  Not done.. Pupils equal, briskly reactive to light. Extraocular movements full without nystagmus. Visual fields full to confrontation. Hearing intact. Facial sensation intact. Face, tongue, palate moves normally and symmetrically.  Motor: Normal bulk and tone. Normal strength in all tested extremity muscles. Sensory.: intact to touch ,pinprick .position and vibratory sensation.  Coordination: Rapid alternating movements normal in all extremities. Finger-to-nose and heel-to-shin performed accurately bilaterally. Gait and Station: Arises from chair without difficulty. Stance is normal. Gait demonstrates normal stride length and balance . Not able to heel, toe and tandem walk without difficulty.  Reflexes: 1+ and  symmetric. Toes downgoing.       ASSESSMENT: [age] year with TIA in August 2016 following elective ablation for supraventricular tachycardia. Vascular risk factors of hyperlipidemia and borderline hypertension. She had long-standing history of supposedly had atrial fibrillation for which she was on warfarin but recently this diagnosis was clarified to supraventricular tachycardia    PLAN: II had a long d/w patient about her remote TIA,e, risk for recurrent stroke/TIAs, personally independently reviewed imaging studies and stroke evaluation results and answered questions.Continue aspirin 81 mg daily  for secondary stroke prevention and maintain strict control of hypertension with blood pressure goal below 130/90, diabetes with hemoglobin A1c goal below 6.5% and lipids with LDL cholesterol goal below 70 mg/dL. I also advised the patient to eat a healthy diet with plenty of whole grains, cereals, fruits and vegetables, exercise regularly and maintain ideal body weight Followup in the future with me in 1 year or call earlier if necessary  Ann HeadyPramod Ann Soliz, MD  Note: This document was prepared with digital dictation and possible smart phrase technology.  Any transcriptional errors that result from this process are unintentional

## 2015-07-04 DIAGNOSIS — M79671 Pain in right foot: Secondary | ICD-10-CM | POA: Diagnosis not present

## 2015-07-04 DIAGNOSIS — M79672 Pain in left foot: Secondary | ICD-10-CM | POA: Diagnosis not present

## 2015-07-11 DIAGNOSIS — G5601 Carpal tunnel syndrome, right upper limb: Secondary | ICD-10-CM | POA: Diagnosis not present

## 2015-07-11 DIAGNOSIS — M79671 Pain in right foot: Secondary | ICD-10-CM | POA: Diagnosis not present

## 2015-07-11 DIAGNOSIS — M79672 Pain in left foot: Secondary | ICD-10-CM | POA: Diagnosis not present

## 2015-07-14 ENCOUNTER — Telehealth: Payer: Self-pay | Admitting: Interventional Cardiology

## 2015-07-14 NOTE — Telephone Encounter (Signed)
Spoke with pt and told her clearance form was received in office today. Clearance is for carpal tunnel surgery on right hand. Pt reports from a heart stand point she is doing well. No atrial fib.  I told pt Dr. Katrinka BlazingSmith would be back in office on Monday and could review clearance request at that time.

## 2015-07-14 NOTE — Telephone Encounter (Signed)
Pt called following up on her Cardi. Clearance pt would like to have it done as soon as possible. Dr Barbarann EhlersGefgiore  Office.

## 2015-07-16 NOTE — Telephone Encounter (Signed)
Cleared for surgery 

## 2015-07-18 NOTE — Telephone Encounter (Signed)
Pt aware [facility] attn: Dr.Gioffre fax # [phone]

## 2015-07-27 DIAGNOSIS — G5601 Carpal tunnel syndrome, right upper limb: Secondary | ICD-10-CM | POA: Diagnosis not present

## 2015-09-07 DIAGNOSIS — G5601 Carpal tunnel syndrome, right upper limb: Secondary | ICD-10-CM | POA: Diagnosis not present

## 2015-09-07 DIAGNOSIS — G5602 Carpal tunnel syndrome, left upper limb: Secondary | ICD-10-CM | POA: Diagnosis not present

## 2015-09-09 DIAGNOSIS — Z4789 Encounter for other orthopedic aftercare: Secondary | ICD-10-CM | POA: Diagnosis not present

## 2015-09-09 DIAGNOSIS — G5602 Carpal tunnel syndrome, left upper limb: Secondary | ICD-10-CM | POA: Diagnosis not present

## 2015-10-10 DIAGNOSIS — E042 Nontoxic multinodular goiter: Secondary | ICD-10-CM | POA: Diagnosis not present

## 2015-10-10 DIAGNOSIS — H6121 Impacted cerumen, right ear: Secondary | ICD-10-CM | POA: Diagnosis not present

## 2015-10-10 DIAGNOSIS — K219 Gastro-esophageal reflux disease without esophagitis: Secondary | ICD-10-CM | POA: Diagnosis not present

## 2015-10-10 DIAGNOSIS — M810 Age-related osteoporosis without current pathological fracture: Secondary | ICD-10-CM | POA: Diagnosis not present

## 2015-10-10 DIAGNOSIS — E559 Vitamin D deficiency, unspecified: Secondary | ICD-10-CM | POA: Diagnosis not present

## 2015-10-10 DIAGNOSIS — K5900 Constipation, unspecified: Secondary | ICD-10-CM | POA: Diagnosis not present

## 2015-10-10 DIAGNOSIS — Z0000 Encounter for general adult medical examination without abnormal findings: Secondary | ICD-10-CM | POA: Diagnosis not present

## 2015-10-10 DIAGNOSIS — E782 Mixed hyperlipidemia: Secondary | ICD-10-CM | POA: Diagnosis not present

## 2015-10-10 DIAGNOSIS — L821 Other seborrheic keratosis: Secondary | ICD-10-CM | POA: Diagnosis not present

## 2015-10-10 DIAGNOSIS — J309 Allergic rhinitis, unspecified: Secondary | ICD-10-CM | POA: Diagnosis not present

## 2015-10-29 DIAGNOSIS — Z4789 Encounter for other orthopedic aftercare: Secondary | ICD-10-CM | POA: Diagnosis not present

## 2015-11-07 ENCOUNTER — Other Ambulatory Visit (HOSPITAL_COMMUNITY): Payer: Self-pay | Admitting: Family Medicine

## 2015-11-07 ENCOUNTER — Ambulatory Visit (HOSPITAL_COMMUNITY): Admission: RE | Admit: 2015-11-07 | Payer: Medicare Other | Source: Ambulatory Visit

## 2015-11-10 DIAGNOSIS — M810 Age-related osteoporosis without current pathological fracture: Secondary | ICD-10-CM | POA: Diagnosis not present

## 2015-11-18 ENCOUNTER — Encounter (HOSPITAL_COMMUNITY): Payer: Self-pay

## 2015-11-18 ENCOUNTER — Ambulatory Visit (HOSPITAL_COMMUNITY)
Admission: RE | Admit: 2015-11-18 | Discharge: 2015-11-18 | Disposition: A | Discharge status: Home / Self Care | Payer: Medicare Other | Source: Ambulatory Visit | Attending: Family Medicine | Admitting: Family Medicine

## 2015-11-18 DIAGNOSIS — M810 Age-related osteoporosis without current pathological fracture: Secondary | ICD-10-CM | POA: Insufficient documentation

## 2015-11-18 MED ORDER — SODIUM CHLORIDE 0.9 % IV SOLN
Freq: Once | INTRAVENOUS | Status: AC
  Administered 2015-11-18: 11:00:00 via INTRAVENOUS

## 2015-11-18 MED ORDER — ZOLEDRONIC ACID 5 MG/100ML IV SOLN
5.0000 mg | Freq: Once | INTRAVENOUS | Status: AC
  Administered 2015-11-18: 5 mg via INTRAVENOUS
  Filled 2015-11-18: qty 100

## 2015-11-18 NOTE — Discharge Instructions (Signed)

## 2015-12-12 DIAGNOSIS — Z23 Encounter for immunization: Secondary | ICD-10-CM | POA: Diagnosis not present

## 2016-01-13 ENCOUNTER — Encounter: Payer: Self-pay | Admitting: Neurology

## 2016-02-06 ENCOUNTER — Ambulatory Visit
Admission: RE | Admit: 2016-02-06 | Discharge: 2016-02-06 | Disposition: A | Discharge status: Home / Self Care | Payer: Medicare Other | Source: Ambulatory Visit | Attending: Family Medicine | Admitting: Family Medicine

## 2016-02-06 ENCOUNTER — Other Ambulatory Visit: Payer: Self-pay | Admitting: Family Medicine

## 2016-02-06 DIAGNOSIS — M545 Low back pain, unspecified: Secondary | ICD-10-CM

## 2016-02-06 DIAGNOSIS — M47816 Spondylosis without myelopathy or radiculopathy, lumbar region: Secondary | ICD-10-CM | POA: Diagnosis not present

## 2016-02-09 DIAGNOSIS — M545 Low back pain: Secondary | ICD-10-CM | POA: Diagnosis not present

## 2016-02-27 ENCOUNTER — Ambulatory Visit (INDEPENDENT_AMBULATORY_CARE_PROVIDER_SITE_OTHER): Payer: Medicare Other | Admitting: Interventional Cardiology

## 2016-02-27 ENCOUNTER — Encounter: Payer: Self-pay | Admitting: Interventional Cardiology

## 2016-02-27 VITALS — BP 118/72 | HR 73 | Ht 65.5 in | Wt 145.8 lb

## 2016-02-27 DIAGNOSIS — G459 Transient cerebral ischemic attack, unspecified: Secondary | ICD-10-CM | POA: Diagnosis not present

## 2016-02-27 DIAGNOSIS — I5032 Chronic diastolic (congestive) heart failure: Secondary | ICD-10-CM

## 2016-02-27 DIAGNOSIS — Z7901 Long term (current) use of anticoagulants: Secondary | ICD-10-CM

## 2016-02-27 DIAGNOSIS — I480 Paroxysmal atrial fibrillation: Secondary | ICD-10-CM | POA: Diagnosis not present

## 2016-02-27 NOTE — Patient Instructions (Signed)

## 2016-02-27 NOTE — Progress Notes (Signed)
Cardiology Office Note    Date:  02/27/2016   ID:  Ann Rodriguez, DOB 02/19/1929, MRN [id number]  PCP:  Sissy HoffSWAYNE,DAVID W, MD  Cardiologist: Lesleigh NoeHenry W Violett Hobbs III, MD   Chief Complaint  Patient presents with  . Atrial Fibrillation    History of Present Illness:  Ann Rodriguez is a 82 y.o. female  who presents for follow-up of SVT s/p ablation, possible PAF(remote) without documentation on ECG, hypertension, and disastolic dysfunction. After SVT ablation, flecainide and anticoagulation was discontinued.   She is asymptomatic. There've been no recurrent episodes of prolonged tachycardia.    Past Medical History:  Diagnosis Date  . Arthritis   . Depression    PT'S HUSBAND AND SON HAVE DIED W/IN PAST YEAR 2013  . GERD (gastroesophageal reflux disease)   . Hearing loss   . Hyperlipidemia   . Stroke (HCC)   . SVT (supraventricular tachycardia) (HCC) 09/2014  . Thyroid disease     Past Surgical History:  Procedure Laterality Date  . APPENDECTOMY    . BACK SURGERY     lumbar  . BLADDER SURGERY    . BUNIONECTOMY     left and right with hammer toe repair  . CARPAL TUNNEL RELEASE Bilateral 2017  . ELECTROPHYSIOLOGIC STUDY N/A 11/03/2014   Procedure: SVT Ablation;  Surgeon: Marinus MawGregg W Taylor, MD;  [facility];  Service: Cardiovascular;  Laterality: N/A;  . EYE SURGERY     bilateral cataract extraction with IOL  . FOOT SURGERY    . KNEE ARTHROSCOPY  02/09/2011   Procedure: ARTHROSCOPY KNEE;  Surgeon: Jacki Conesonald A Gioffre;  Location: [facility];  Service: Orthopedics;  Laterality: Left;  Left Knee Arthroscopy with Menisectomy medial, abrasion chondroplasty medial, and synovectomy, supra patella pouch.  Marland Kitchen. NASAL SINUS SURGERY    . OOPHORECTOMY    . ROTATOR CUFF REPAIR  1/13,  3/13   x 3  right/  . SALPINGECTOMY    . SHOULDER OPEN ROTATOR CUFF REPAIR Left 06/26/2012   Procedure: LEFT SHOULDER ROTATOR CUFF REPAIR WITH ANCHORS ;  Surgeon: Jacki Conesonald A Gioffre, MD;  Location: [facility];   Service: Orthopedics;  Laterality: Left;  . TOTAL KNEE ARTHROPLASTY  09/05/2011   Procedure: TOTAL KNEE ARTHROPLASTY;  Surgeon: Jacki Conesonald A Gioffre, MD;  Location: [facility];  Service: Orthopedics;  Laterality: Left;    Current Medications: Outpatient Medications Prior to Visit  Medication Sig Dispense Refill  . aspirin EC 81 MG tablet Take 81 mg by mouth daily.    Marland Kitchen. atorvastatin (LIPITOR) 40 MG tablet Take 40 mg by mouth every evening.     . beta carotene w/minerals (OCUVITE) tablet Take 1 tablet by mouth daily with breakfast.     . BIOTIN PO Take 1 tablet by mouth daily.    . calcium carbonate (OS-CAL) 600 MG TABS Take 1,800 mg by mouth daily with breakfast.     . cholecalciferol (VITAMIN D) 1000 UNITS tablet Take 1,000 Units by mouth daily.     Marland Kitchen. esomeprazole (NEXIUM) 20 MG capsule Take 20 mg by mouth every morning.     Marland Kitchen. oxyCODONE-acetaminophen (PERCOCET/ROXICET) 5-325 MG tablet Take 1 tablet by mouth every 6 (six) hours as needed for moderate pain.   0  . polyethylene glycol (MIRALAX / GLYCOLAX) packet Take 17 g by mouth every other day.     . vitamin B-12 (CYANOCOBALAMIN) 100 MCG tablet Take 100 mcg by mouth daily.    Marland Kitchen. gabapentin (NEURONTIN) 300 MG capsule 2 (two)  times daily.   0   No facility-administered medications prior to visit.      Allergies:   Patient has no known allergies.   Social History   Social History  . Marital status: Widowed    Spouse name: N/A  . Number of children: N/A  . Years of education: N/A   Social History Main Topics  . Smoking status: Former Smoker    Packs/day: 0.00    Years: 0.00    Types: Cigarettes    Quit date: 02/04/1974  . Smokeless tobacco: Never Used  . Alcohol use 0.6 oz/week    1 Glasses of wine per week     Comment: socially  . Drug use: No  . Sexual activity: Not on file   Other Topics Concern  . Not on file   Social History Narrative  . No narrative on file     Family History:  The patient's family history includes  Diabetes in her mother and son.   ROS:   Please see the history of present illness.    Back pain, restless legs, occasional palpitation.  All other systems reviewed and are negative.   PHYSICAL EXAM:   VS:  BP 118/72 (BP Location: Left Arm)   Pulse 73   Ht 5' 5.5" (1.664 m)   Wt 145 lb 12.8 oz (66.1 kg)   BMI 23.89 kg/m    GEN: Well nourished, well developed, in no acute distress  HEENT: normal  Neck: no JVD, carotid bruits, or masses Cardiac: RRR; no murmurs, rubs, or gallops,no edema  Respiratory:  clear to auscultation bilaterally, normal work of breathing GI: soft, nontender, nondistended, + BS MS: no deformity or atrophy  Skin: warm and dry, no rash Neuro:  Alert and Oriented x 3, Strength and sensation are intact Psych: euthymic mood, full affect  Wt Readings from Last 3 Encounters:  02/27/16 145 lb 12.8 oz (66.1 kg)  11/18/15 150 lb 3.2 oz (68.1 kg)  06/24/15 148 lb (67.1 kg)      Studies/Labs Reviewed:   EKG:  EKG  Normal sinus rhythm, left atrial abnormality, nonspecific ST abnormality.  Recent Labs: No results found for requested labs within last 8760 hours.   Lipid Panel    Component Value Date/Time   CHOL 161 11/03/2014 0450   TRIG 130 11/03/2014 0450   HDL 47 11/03/2014 0450   CHOLHDL 3.4 11/03/2014 0450   VLDL 26 11/03/2014 0450   LDLCALC 88 11/03/2014 0450    Additional studies/ records that were reviewed today include:  None    ASSESSMENT:    1. Paroxysmal atrial fibrillation (HCC)   2. Chronic diastolic heart failure (HCC)   3. Transient cerebral ischemia, unspecified type   4. Chronic anticoagulation      PLAN:  In order of problems listed above:  1. No documentation of atrial fibrillation. No recurrence of tachycardia since ablation for SVT. 2. Volume status is stable. 3. No recurrence 4. On aspirin daily and not chronic anticoagulation.    Medication Adjustments/Labs and Tests Ordered: Current medicines are reviewed at  length with the patient today.  Concerns regarding medicines are outlined above.  Medication changes, Labs and Tests ordered today are listed in the Patient Instructions below. There are no Patient Instructions on file for this visit.   Signed, Lesleigh NoeHenry W Terrisa Curfman III, MD  02/27/2016 8:49 AM    [facility] [address], [place], [place]  [address] Phone: [phone]) [phone]; Fax: [phone]

## 2016-03-02 DIAGNOSIS — M545 Low back pain: Secondary | ICD-10-CM | POA: Diagnosis not present

## 2016-04-10 DIAGNOSIS — R7303 Prediabetes: Secondary | ICD-10-CM | POA: Diagnosis not present

## 2016-04-10 DIAGNOSIS — E782 Mixed hyperlipidemia: Secondary | ICD-10-CM | POA: Diagnosis not present

## 2016-04-10 DIAGNOSIS — J309 Allergic rhinitis, unspecified: Secondary | ICD-10-CM | POA: Diagnosis not present

## 2016-04-10 DIAGNOSIS — Z8659 Personal history of other mental and behavioral disorders: Secondary | ICD-10-CM | POA: Diagnosis not present

## 2016-04-10 DIAGNOSIS — M810 Age-related osteoporosis without current pathological fracture: Secondary | ICD-10-CM | POA: Diagnosis not present

## 2016-04-10 DIAGNOSIS — K219 Gastro-esophageal reflux disease without esophagitis: Secondary | ICD-10-CM | POA: Diagnosis not present

## 2016-04-10 DIAGNOSIS — E042 Nontoxic multinodular goiter: Secondary | ICD-10-CM | POA: Diagnosis not present

## 2016-04-10 DIAGNOSIS — K5900 Constipation, unspecified: Secondary | ICD-10-CM | POA: Diagnosis not present

## 2016-04-10 DIAGNOSIS — E559 Vitamin D deficiency, unspecified: Secondary | ICD-10-CM | POA: Diagnosis not present

## 2016-05-08 DIAGNOSIS — H9191 Unspecified hearing loss, right ear: Secondary | ICD-10-CM | POA: Diagnosis not present

## 2016-05-08 DIAGNOSIS — J309 Allergic rhinitis, unspecified: Secondary | ICD-10-CM | POA: Diagnosis not present

## 2016-05-08 DIAGNOSIS — H6123 Impacted cerumen, bilateral: Secondary | ICD-10-CM | POA: Diagnosis not present

## 2016-05-11 DIAGNOSIS — H353121 Nonexudative age-related macular degeneration, left eye, early dry stage: Secondary | ICD-10-CM | POA: Diagnosis not present

## 2016-05-11 DIAGNOSIS — H5201 Hypermetropia, right eye: Secondary | ICD-10-CM | POA: Diagnosis not present

## 2016-05-11 DIAGNOSIS — H5212 Myopia, left eye: Secondary | ICD-10-CM | POA: Diagnosis not present

## 2016-05-11 DIAGNOSIS — H353111 Nonexudative age-related macular degeneration, right eye, early dry stage: Secondary | ICD-10-CM | POA: Diagnosis not present

## 2016-06-12 DIAGNOSIS — M6740 Ganglion, unspecified site: Secondary | ICD-10-CM | POA: Diagnosis not present

## 2016-06-12 DIAGNOSIS — R202 Paresthesia of skin: Secondary | ICD-10-CM | POA: Diagnosis not present

## 2016-06-14 ENCOUNTER — Telehealth: Payer: Self-pay | Admitting: Neurology

## 2016-06-14 NOTE — Telephone Encounter (Signed)
Ask her why  And if ok with another MD willing to see her

## 2016-06-14 NOTE — Telephone Encounter (Signed)
You last saw this patient 06/24/15. I received a new referral for this patient and she would like to see another doctor. Is it ok to switch doctors? Please advise. Thanks dg

## 2016-06-15 ENCOUNTER — Telehealth: Payer: Self-pay | Admitting: Neurology

## 2016-06-15 NOTE — Telephone Encounter (Signed)
I'll be happy to see the patient.

## 2016-06-15 NOTE — Telephone Encounter (Signed)
The patient said she had difficulty in understanding/language barrier. I will check with Dr Anne Hahn to see if ok to see him. dg

## 2016-06-15 NOTE — Telephone Encounter (Signed)
This is patient of Dr Pearlean Brownie. We received a new referral for her and I called her to schedule appointment and she said she did not want to see Dr Pearlean Brownie due to language barrier. Would you be willing to see this patient? Please advise. Thanks dg

## 2016-06-18 NOTE — Telephone Encounter (Signed)
It seems the patient needs to be seen for numbness in her legs. It is okay with me to switch to another provider

## 2016-06-19 NOTE — Telephone Encounter (Signed)
Thank you Dr Pearlean Brownie dg

## 2016-06-19 NOTE — Telephone Encounter (Signed)
Thank you Dr Anne Hahn dg

## 2016-06-22 ENCOUNTER — Ambulatory Visit: Payer: Medicare Other | Admitting: Neurology

## 2016-06-27 ENCOUNTER — Encounter (INDEPENDENT_AMBULATORY_CARE_PROVIDER_SITE_OTHER): Payer: Self-pay

## 2016-06-27 ENCOUNTER — Ambulatory Visit (INDEPENDENT_AMBULATORY_CARE_PROVIDER_SITE_OTHER): Payer: Medicare Other | Admitting: Neurology

## 2016-06-27 ENCOUNTER — Encounter: Payer: Self-pay | Admitting: Neurology

## 2016-06-27 VITALS — BP 146/81 | HR 62 | Ht 66.0 in | Wt 149.0 lb

## 2016-06-27 DIAGNOSIS — R202 Paresthesia of skin: Secondary | ICD-10-CM | POA: Diagnosis not present

## 2016-06-27 DIAGNOSIS — E538 Deficiency of other specified B group vitamins: Secondary | ICD-10-CM

## 2016-06-27 NOTE — Patient Instructions (Signed)
   We will check blood work today and get EMG and NCV evaluation to look at the nerve function.

## 2016-06-27 NOTE — Progress Notes (Signed)
Reason for visit: Foot numbness  Referring physician: Dr. Swayne  Ann Rodriguez is a 83 y.o. female  History Ann Rodriguez illness:  Ann Rodriguez is an 83 year old right-handed white female with a 5 or 6 year history of sensory alteration in the feet. The patient indicates that she feels as if she has a plastic film over her feet that is most noticeable when she is barefoot or she is trying to get to sleep at night. The patient does not otherwise notice any sensory changes when she is up and active. The patient has noted some mild changes in balance, she denies any falls. The patient has a history of carpal tunnel syndrome, she had surgery on both hands within the last year. The patient continues to have some sensory changes and weakness of the left hand following the surgery. The patient has had prior lumbosacral spine surgery, she denies any significant back pain or leg discomfort at this time. She does have some occasional urinary incontinence, she denies any incontinence of the bowels. She denies any burning or stinging or lancinating pains in the feet. She does get some nocturnal leg cramps at times. The patient is sent to this office for evaluation of the persistent sensory alterations in the feet. The patient has noted over time that the sensations have crept up the legs, this is now noticeable about halfway down the lower legs to the feet.  Past Medical History:  Diagnosis Date  . Arthritis   . Depression    PT'S HUSBAND AND SON HAVE DIED W/IN PAST YEAR 2011-07-26  . GERD (gastroesophageal reflux disease)   . Hearing loss   . Hyperlipidemia   . Stroke (HCC)   . SVT (supraventricular tachycardia) (HCC) 09/2014  . Thyroid disease     Past Surgical History:  Procedure Laterality Date  . APPENDECTOMY    . BACK SURGERY     lumbar  . BLADDER SURGERY    . BUNIONECTOMY     left and right with hammer toe repair  . CARPAL TUNNEL RELEASE Bilateral Jul 26, 2015  . ELECTROPHYSIOLOGIC STUDY N/A 11/03/2014    Procedure: SVT Ablation;  Surgeon: Marinus Maw, MD;  [facility]AS[facility] CV LAB;  Service: Cardiovascular;  Laterality: N/A;  . EYE SURGERY     bilateral cataract extraction with IOL  . FOOT SURGERY    . KNEE ARTHROSCOPY  02/09/2011   Procedure: ARTHROSCOPY KNEE;  Surgeon: Jacki Cones;  Location: [facility];  Service: Orthopedics;  Laterality: Left;  Left Knee Arthroscopy with Menisectomy medial, abrasion chondroplasty medial, and synovectomy, supra patella pouch.  Marland Kitchen NASAL SINUS SURGERY    . OOPHORECTOMY    . ROTATOR CUFF REPAIR  1/13,  3/13   x 3  right/  . SALPINGECTOMY    . SHOULDER OPEN ROTATOR CUFF REPAIR Left 06/26/2012   Procedure: LEFT SHOULDER ROTATOR CUFF REPAIR WITH ANCHORS ;  Surgeon: Jacki Cones, MD;  Location: [facility];  Service: Orthopedics;  Laterality: Left;  . TOTAL KNEE ARTHROPLASTY  09/05/2011   Procedure: TOTAL KNEE ARTHROPLASTY;  Surgeon: Jacki Cones, MD;  Location: [facility];  Service: Orthopedics;  Laterality: Left;    Family History  Problem Relation Age of Onset  . Diabetes Mother   . Diabetes Son     Social history:  reports that she quit smoking about 42 years ago. Her smoking use included Cigarettes. She smoked 0.00 packs per day for 0.00 years. She has never used smokeless tobacco. She  reports that she drinks about 0.6 oz of alcohol per week . She reports that she does not use drugs.  Medications:  Prior to Admission medications   Medication Sig Start Date End Date Taking? Authorizing Provider  aspirin EC 81 MG tablet Take 81 mg by mouth daily.   Yes Historical Provider, MD  atorvastatin (LIPITOR) 40 MG tablet Take 40 mg by mouth every evening.    Yes Historical Provider, MD  beta carotene w/minerals (OCUVITE) tablet Take 1 tablet by mouth daily with breakfast.    Yes Historical Provider, MD  BIOTIN PO Take 1 tablet by mouth daily.   Yes Historical Provider, MD  calcium carbonate (OS-CAL) 600 MG TABS Take 1,800 mg by mouth daily with  breakfast.    Yes Historical Provider, MD  cholecalciferol (VITAMIN D) 1000 UNITS tablet Take 1,000 Units by mouth daily.    Yes Historical Provider, MD  esomeprazole (NEXIUM) 20 MG capsule Take 20 mg by mouth every morning.    Yes Historical Provider, MD  oxyCODONE-acetaminophen (PERCOCET/ROXICET) 5-325 MG tablet Take 1 tablet by mouth every 6 (six) hours as needed for moderate pain.  03/23/15  Yes Historical Provider, MD  polyethylene glycol (MIRALAX / GLYCOLAX) packet Take 17 g by mouth every other day.    Yes Historical Provider, MD  vitamin B-12 (CYANOCOBALAMIN) 100 MCG tablet Take 100 mcg by mouth daily.   Yes Historical Provider, MD     No Known Allergies  ROS:  Out of a complete 14 system review of symptoms, the patient complains only of the following symptoms, and all other reviewed systems are negative.  Numbness Hearing loss Incontinence of the bladder Joint pain, muscle cramps Skin sensitivity  Blood pressure (!) 146/81, pulse 62, height  (1.676 m), weight 149 lb (67.6 kg).  Physical Exam  General: The patient is alert and cooperative at the time of the examination.  Eyes: Pupils are equal, round, and reactive to light. Discs are flat bilaterally.  Neck: The neck is supple, no carotid bruits are noted.  Respiratory: The respiratory examination is clear.  Cardiovascular: The cardiovascular examination reveals a regular rate and rhythm, no obvious murmurs or rubs are noted.  Skin: Extremities are without significant edema.  Neurologic Exam  Mental status: The patient is alert and oriented x 3 at the time of the examination. The patient has apparent normal recent and remote memory, with an apparently normal attention span and concentration ability.  Cranial nerves: Facial symmetry is present. There is good sensation of the face to pinprick and soft touch bilaterally. The strength of the facial muscles and the muscles to head turning and shoulder shrug are normal  bilaterally. Speech is well enunciated, no aphasia or dysarthria is noted. Extraocular movements are full. Visual fields are full. The tongue is midline, and the patient has symmetric elevation of the soft palate. No obvious hearing deficits are noted.  Motor: The motor testing reveals 5 over 5 strength of all 4 extremities. Good symmetric motor tone is noted throughout.  Sensory: Sensory testing is intact to pinprick, soft touch, vibration sensation, and position sense on all 4 extremities, with exception of some decrease in vibration sensation in both feet, a stocking pattern pinprick sensory deficit noted on the left leg one half way up the leg, not present on the right. No evidence of extinction is noted.  Coordination: Cerebellar testing reveals good finger-nose-finger and heel-to-shin bilaterally.  Gait and station: Gait is normal. Tandem gait is unsteady. Romberg is negative.  No drift is seen.  Reflexes: Deep tendon reflexes are symmetric, but are depressed bilaterally. Toes are downgoing bilaterally.   Assessment/Plan:  1. Possible mild peripheral neuropathy  The patient has a history of prediabetes, she is on no medications for this. The patient does have a history of prior lumbosacral spine surgery. The patient has had some sensory alteration both feet over 5 or 6 years. She will be set up for blood work today, she will have nerve conduction studies of both legs and EMG on one leg. The patient is not having significant discomfort with the feet, there is no indication for medical therapy at this time.   Marlan Palau MD 06/27/2016 8:42 AM  [facility] [address] [place], [place] [address]  Phone [phone] Fax [phone]

## 2016-07-02 ENCOUNTER — Telehealth: Payer: Self-pay | Admitting: *Deleted

## 2016-07-02 LAB — ANA W/REFLEX: Anti Nuclear Antibody(ANA): NEGATIVE

## 2016-07-02 LAB — MULTIPLE MYELOMA PANEL, SERUM
ALBUMIN SERPL ELPH-MCNC: 3.9 g/dL (ref 2.9–4.4)
Albumin/Glob SerPl: 1.4 (ref 0.7–1.7)
Alpha 1: 0.3 g/dL (ref 0.0–0.4)
Alpha2 Glob SerPl Elph-Mcnc: 0.8 g/dL (ref 0.4–1.0)
B-GLOBULIN SERPL ELPH-MCNC: 1.1 g/dL (ref 0.7–1.3)
GAMMA GLOB SERPL ELPH-MCNC: 0.8 g/dL (ref 0.4–1.8)
GLOBULIN, TOTAL: 2.9 g/dL (ref 2.2–3.9)
IGA/IMMUNOGLOBULIN A, SERUM: 148 mg/dL (ref 64–422)
IgG (Immunoglobin G), Serum: 832 mg/dL (ref 700–1600)
IgM (Immunoglobulin M), Srm: 73 mg/dL (ref 26–217)
Total Protein: 6.8 g/dL (ref 6.0–8.5)

## 2016-07-02 LAB — ANGIOTENSIN CONVERTING ENZYME: Angio Convert Enzyme: 69 U/L (ref 14–82)

## 2016-07-02 LAB — B. BURGDORFI ANTIBODIES

## 2016-07-02 LAB — VITAMIN B12: Vitamin B-12: 1796 pg/mL — ABNORMAL HIGH (ref 232–1245)

## 2016-07-02 NOTE — Telephone Encounter (Signed)
Called and spoke with patient about unremarkable labs per CW,MD note. Pt verbalized understanding.  

## 2016-07-02 NOTE — Telephone Encounter (Signed)
-----   Message from York Spaniel, MD sent at 07/02/2016  4:53 PM EDT -----  The blood work results are unremarkable. Please call the patient.  ----- Message ----- From: Nell Range Lab Results In Sent: 06/28/2016   7:42 AM To: York Spaniel, MD

## 2016-07-18 ENCOUNTER — Ambulatory Visit (INDEPENDENT_AMBULATORY_CARE_PROVIDER_SITE_OTHER): Payer: Self-pay | Admitting: Neurology

## 2016-07-18 ENCOUNTER — Encounter: Payer: Self-pay | Admitting: Neurology

## 2016-07-18 ENCOUNTER — Ambulatory Visit (INDEPENDENT_AMBULATORY_CARE_PROVIDER_SITE_OTHER): Payer: Medicare Other | Admitting: Neurology

## 2016-07-18 ENCOUNTER — Telehealth: Payer: Self-pay | Admitting: Neurology

## 2016-07-18 DIAGNOSIS — Z0289 Encounter for other administrative examinations: Secondary | ICD-10-CM

## 2016-07-18 DIAGNOSIS — R202 Paresthesia of skin: Secondary | ICD-10-CM | POA: Diagnosis not present

## 2016-07-18 DIAGNOSIS — G609 Hereditary and idiopathic neuropathy, unspecified: Secondary | ICD-10-CM

## 2016-07-18 HISTORY — DX: Hereditary and idiopathic neuropathy, unspecified: G60.9

## 2016-07-18 NOTE — Procedures (Signed)
     HISTORY:  Ann Rodriguez is an 83 year old patient with a history of prior lumbosacral spine surgery who has developed some numbness in the feet and lower legs. The patient is being evaluated for a possible peripheral neuropathy.  NERVE CONDUCTION STUDIES:  Nerve conduction studies were performed on both lower extremities. The study of the left peroneal nerve was unobtainable, the right peroneal nerve had a prolonged distal motor latency and a low motor amplitude. The distal motor latencies for the posterior tibial nerves were prolonged, the motor amplitudes for these nerves were low bilaterally. The nerve conduction velocities for the right peroneal nerve and for the posterior tibial nerves bilaterally were slowed. There were no responses seen for the peroneal and sural sensory latencies bilaterally. The F wave latencies for the posterior tibial nerves were prolonged bilaterally.  EMG STUDIES:  EMG study was performed on the left lower extremity:  The tibialis anterior muscle reveals 2 to 6K motor units with decreased recruitment. No fibrillations or positive waves were seen. The peroneus tertius muscle reveals 2 to 7K motor units with decreased recruitment. No fibrillations or positive waves were seen. The medial gastrocnemius muscle reveals 1 to 5K motor units with decreased recruitment. No fibrillations or positive waves were seen. The vastus lateralis muscle reveals 2 to 4K motor units with full recruitment. No fibrillations or positive waves were seen. The iliopsoas muscle reveals 2 to 4K motor units with full recruitment. No fibrillations or positive waves were seen. Complex repetitive discharges were noted. The biceps femoris muscle (long head) reveals 2 to 4K motor units with full recruitment. No fibrillations or positive waves were seen. Complex repetitive discharges were noted. The lumbosacral paraspinal muscles were tested at 3 levels, and revealed no abnormalities of insertional  activity at all 3 levels tested. There was good relaxation.   IMPRESSION:  Nerve conduction studies done on both lower extremities shows evidence of a primarily axonal peripheral neuropathy of moderate severity. EMG evaluation of the left lower extremity shows distal chronic stable signs of denervation consistent with the diagnosis of peripheral neuropathy. There does not appear to be evidence of an acute overlying lumbosacral radiculopathy.  Marlan Palau. Keith Willis MD 07/18/2016 11:20 AM  [facility] [address] [place], [place] [address]  Phone [phone] Fax [phone]

## 2016-07-18 NOTE — Telephone Encounter (Signed)
LVM to schedule 6 mo f/u with Dr. Anne HahnWillis or NP

## 2016-07-18 NOTE — Progress Notes (Signed)
Please refer to EMG and nerve conduction study procedure note. 

## 2016-07-18 NOTE — Progress Notes (Signed)
EMG and nerve conduction study done today shows evidence of a peripheral neuropathy. The patient is not having a lot of discomfort, no indication for medications. She may have a history of borderline diabetes or prediabetes. She will follow-up in 6 months. She does note some mild gait instability.

## 2016-09-11 DIAGNOSIS — M810 Age-related osteoporosis without current pathological fracture: Secondary | ICD-10-CM | POA: Diagnosis not present

## 2016-09-27 DIAGNOSIS — M25511 Pain in right shoulder: Secondary | ICD-10-CM | POA: Diagnosis not present

## 2016-09-30 IMAGING — MR MR MRA HEAD W/O CM
9 of 13 series · 29 of 48 positions shown · non-contrast
Comparison: CT 03/29/2011

CLINICAL DATA: Acute onset of visual deficit.  Rule out stroke.

EXAM:
MRI HEAD WITHOUT CONTRAST
MRA HEAD WITHOUT CONTRAST
TECHNIQUE: Multiplanar, multiecho pulse sequences of the brain and surrounding
structures were obtained without intravenous contrast. Angiographic
images of the head were obtained using MRA technique without
contrast.

[Series 2: FLAIR · sagittal · 5.0mm · 0.47mm/px · 2 of 23 slices shown (1 of 2)]
[im 1/23]
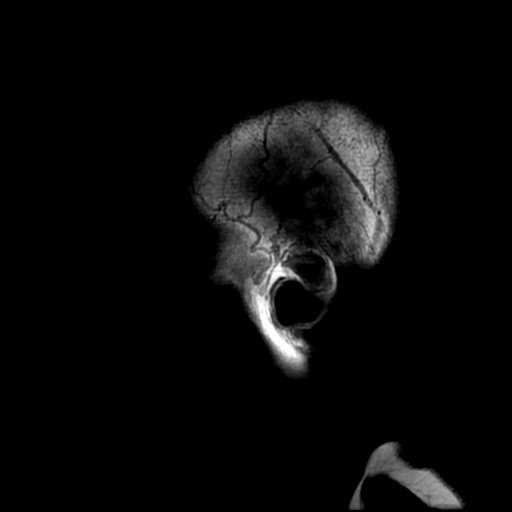
[im 23/23]
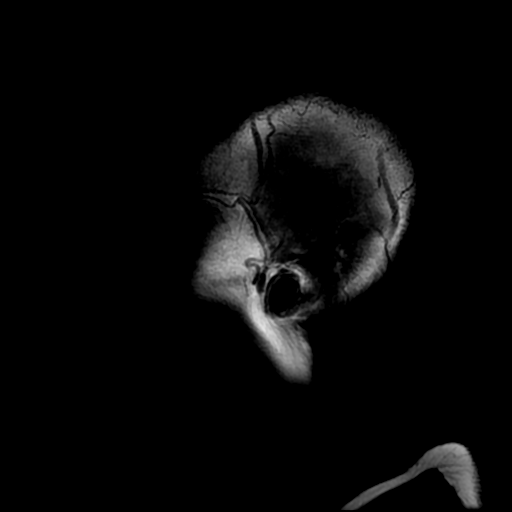

[Series 4: DWI · axial · 3.0mm · 0.94mm/px · z∈[-41,+94]mm · 6 of 94 slices shown (1 of 4)]
[im 1/94]
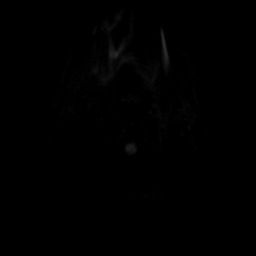
[im 19/94]
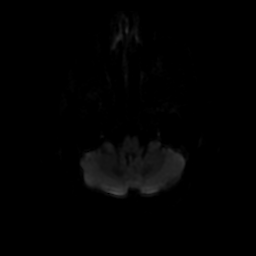
[im 38/94]
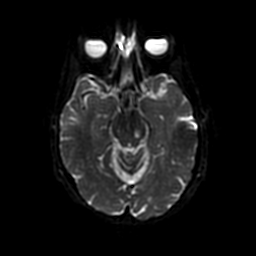
[im 56/94]
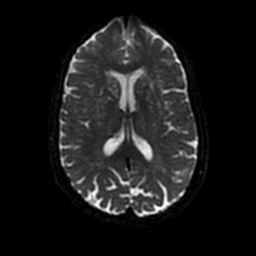
[im 75/94]
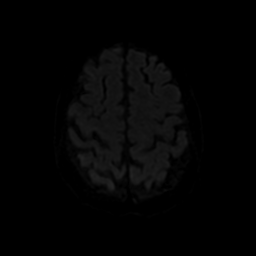
[im 94/94]
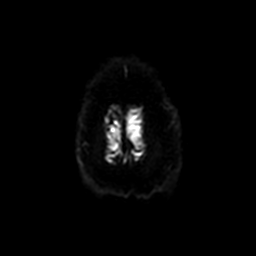

[Series 5: ax (id) 2 · axial · 1.2mm · 0.43mm/px · z∈[-58,+60]mm · 8 of 200 slices shown]
[im 1/200]
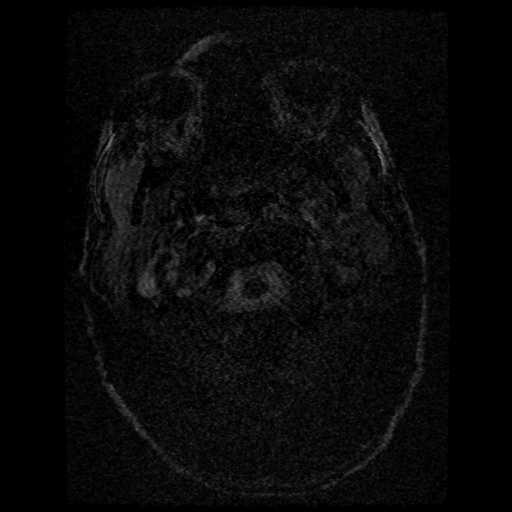
[im 37/200]
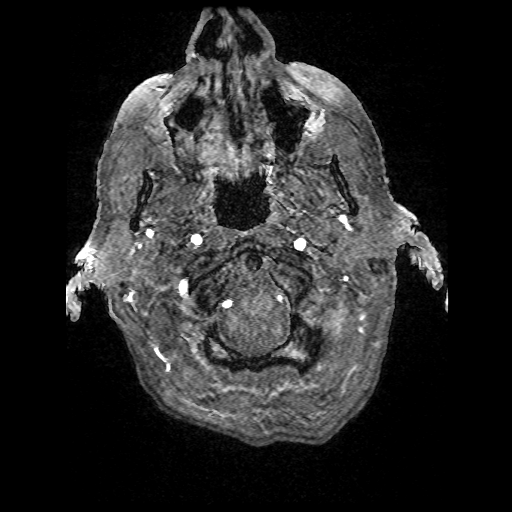
[im 55/200]
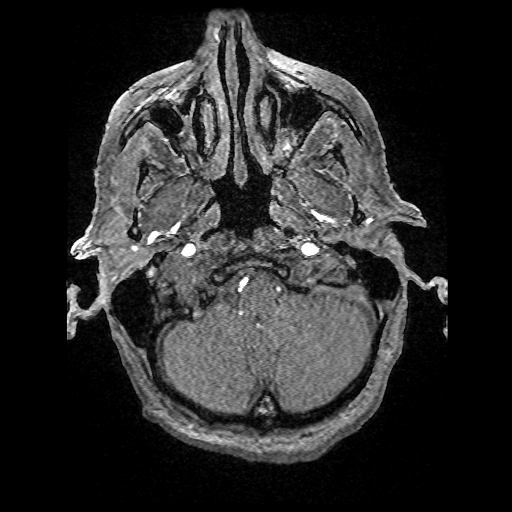
[im 91/200]
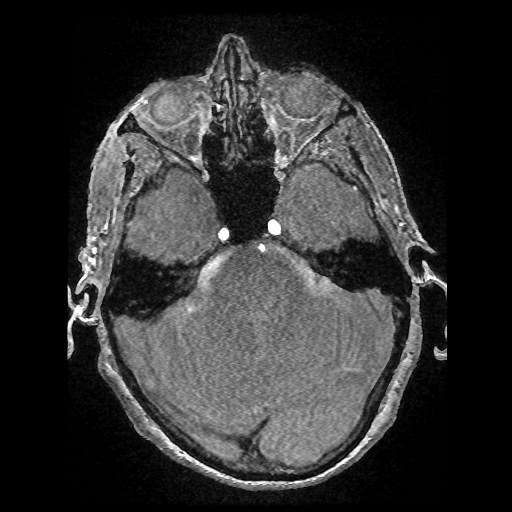
[im 109/200]
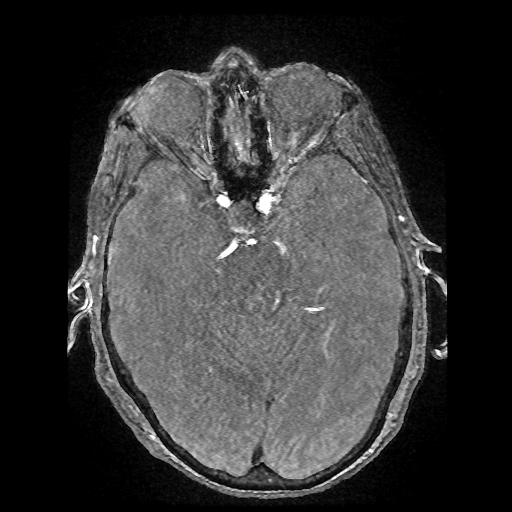
[im 145/200]
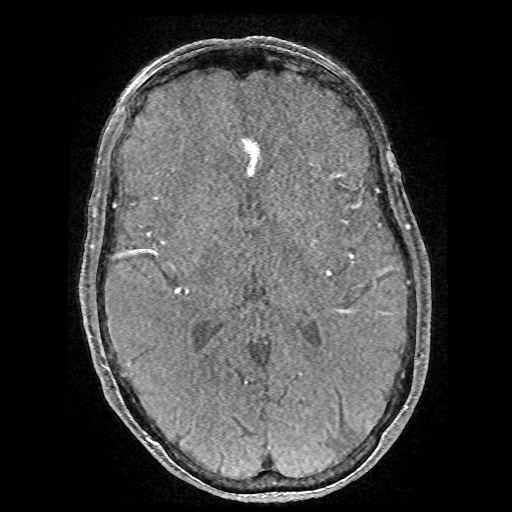
[im 163/200]
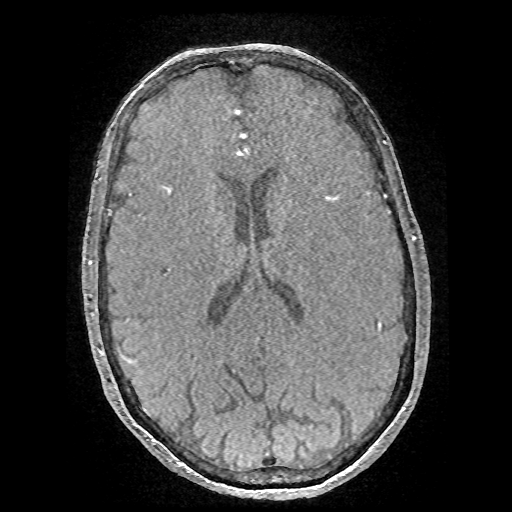
[im 200/200]
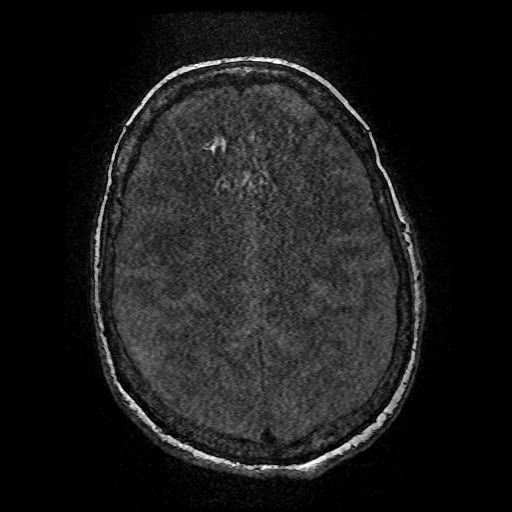

[Series 6: T2 · axial · 5.0mm · 0.47mm/px · 1 of 25 slices shown (1 of 2)]
[im 1/25]
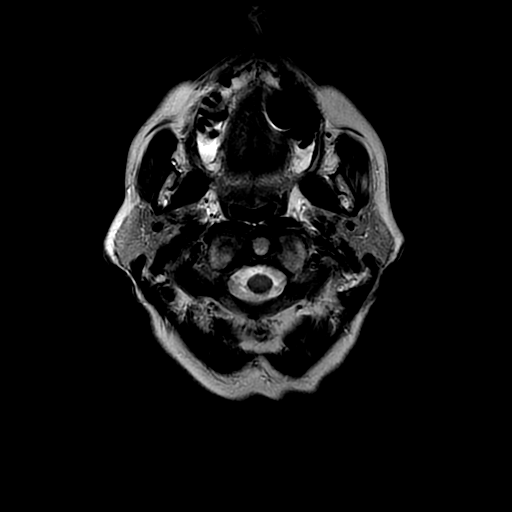

[Series 8: DWI · coronal · 5.0mm · 0.94mm/px · 4 of 70 slices shown (2 of 4)]
[im 1/70]
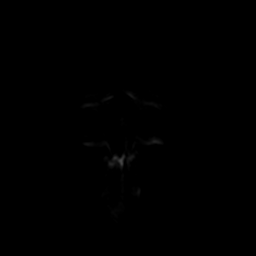
[im 24/70]
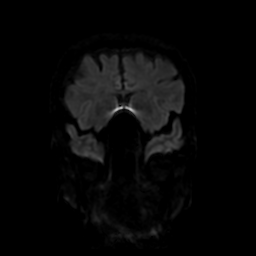
[im 47/70]
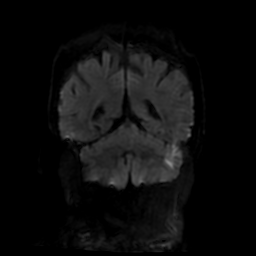
[im 70/70]
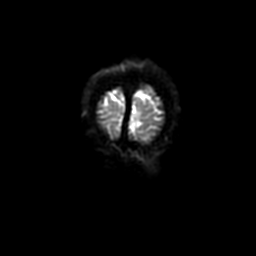

[Series 9: FLAIR · axial · 5.0mm · 0.47mm/px · 1 of 25 slices shown (2 of 2)]
[im 1/25]
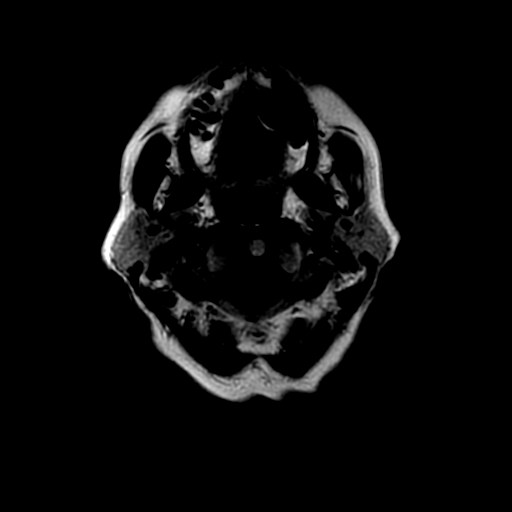

[Series 12: T2 · coronal · 5.0mm · 0.39mm/px · 2 of 30 slices shown (2 of 2)]
[im 1/30]
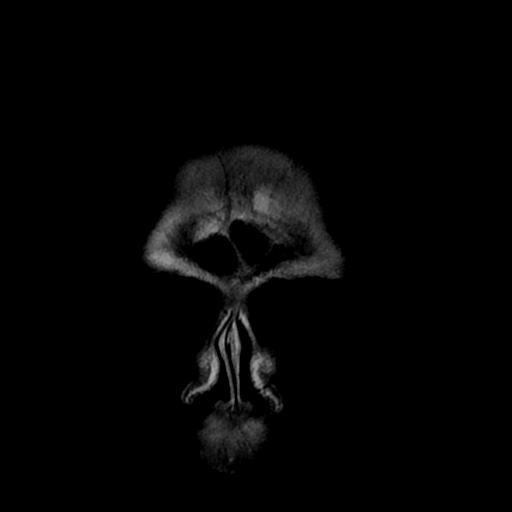
[im 30/30]
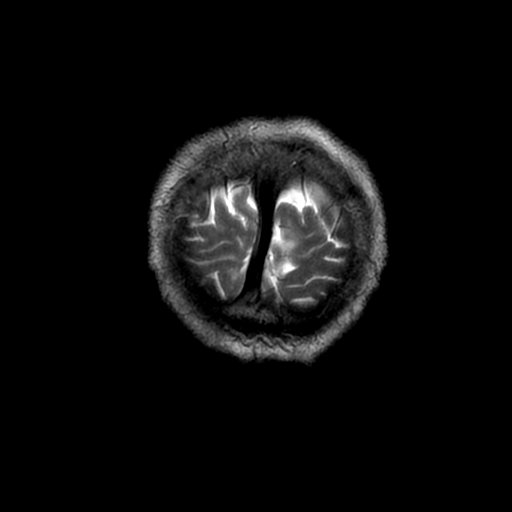

[Series 400: DWI · axial · 3.0mm · 0.94mm/px · z∈[-41,+94]mm · 3 of 46 slices shown (3 of 4)]
[im 1/46]
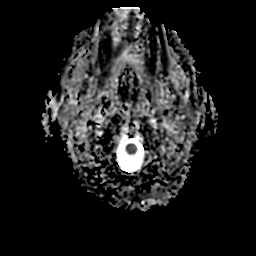
[im 23/46]
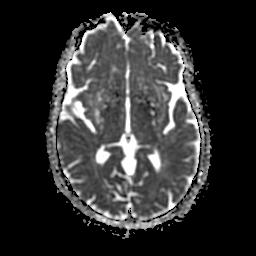
[im 46/46]
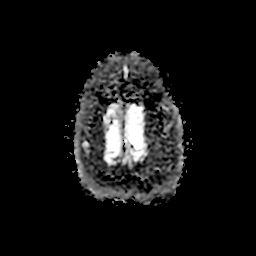

[Series 800: DWI · coronal · 5.0mm · 0.94mm/px · 2 of 35 slices shown (4 of 4)]
[im 1/35]
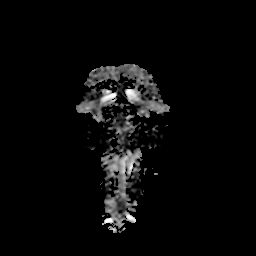
[im 35/35]
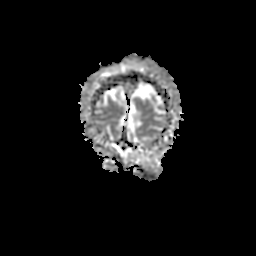

[29 of 48 positions shown; findings below may reference images not displayed]

FINDINGS: MRI HEAD FINDINGS

Negative for acute infarct.

Ventricle size is normal.  Cerebral volume is normal for age.

Minimal chronic microvascular ischemic change in the white matter.
Prominent perivascular spaces in the basal ganglia and midbrain.

Negative for hemorrhage or mass lesion.

Pituitary normal in size. Paranasal sinuses clear. Bilateral lens
extraction.

MRA HEAD FINDINGS

Both vertebral arteries patent to the basilar. Atherosclerotic
irregularity distal left vertebral artery. PICA patent bilaterally.
AICA patent. Superior cerebellar and posterior cerebral arteries
patent. Fetal origin of the posterior cerebral artery with
hypoplastic P1 segments bilaterally.

Internal carotid artery patent bilaterally without stenosis.
Anterior and middle cerebral arteries patent bilaterally

Negative for cerebral aneurysm.
IMPRESSION: Mild chronic microvascular ischemic change.  No acute infarct.

Mild atherosclerotic disease distal left vertebral artery. No
significant intracranial stenosis.

## 2016-11-01 DIAGNOSIS — M25511 Pain in right shoulder: Secondary | ICD-10-CM | POA: Diagnosis not present

## 2016-11-01 DIAGNOSIS — R7303 Prediabetes: Secondary | ICD-10-CM | POA: Diagnosis not present

## 2016-11-01 DIAGNOSIS — E782 Mixed hyperlipidemia: Secondary | ICD-10-CM | POA: Diagnosis not present

## 2016-11-01 DIAGNOSIS — G603 Idiopathic progressive neuropathy: Secondary | ICD-10-CM | POA: Diagnosis not present

## 2016-11-08 DIAGNOSIS — H6123 Impacted cerumen, bilateral: Secondary | ICD-10-CM | POA: Diagnosis not present

## 2016-11-08 DIAGNOSIS — H6981 Other specified disorders of Eustachian tube, right ear: Secondary | ICD-10-CM | POA: Diagnosis not present

## 2016-11-08 DIAGNOSIS — H8110 Benign paroxysmal vertigo, unspecified ear: Secondary | ICD-10-CM | POA: Diagnosis not present

## 2016-11-15 DIAGNOSIS — M25511 Pain in right shoulder: Secondary | ICD-10-CM | POA: Diagnosis not present

## 2016-11-15 DIAGNOSIS — G5602 Carpal tunnel syndrome, left upper limb: Secondary | ICD-10-CM | POA: Diagnosis not present

## 2016-11-15 DIAGNOSIS — S46211A Strain of muscle, fascia and tendon of other parts of biceps, right arm, initial encounter: Secondary | ICD-10-CM | POA: Diagnosis not present

## 2017-01-22 ENCOUNTER — Ambulatory Visit: Payer: Medicare Other | Admitting: Neurology

## 2017-01-22 ENCOUNTER — Encounter: Payer: Self-pay | Admitting: Neurology

## 2017-01-22 ENCOUNTER — Encounter (INDEPENDENT_AMBULATORY_CARE_PROVIDER_SITE_OTHER): Payer: Self-pay

## 2017-01-22 VITALS — BP 139/82 | HR 71 | Ht 66.0 in | Wt 148.0 lb

## 2017-01-22 DIAGNOSIS — G609 Hereditary and idiopathic neuropathy, unspecified: Secondary | ICD-10-CM

## 2017-01-22 DIAGNOSIS — G4762 Sleep related leg cramps: Secondary | ICD-10-CM

## 2017-01-22 HISTORY — DX: Sleep related leg cramps: G47.62

## 2017-01-22 MED ORDER — BACLOFEN 10 MG PO TABS: 10.0000 mg | ORAL_TABLET | Freq: Every day | ORAL | 3 refills | Status: DC

## 2017-01-22 NOTE — Patient Instructions (Signed)
   We will start baclofen 10 mg tablet at night, begin taking 1/2 tablet before bedtime and if need go to one tablet at night.

## 2017-01-22 NOTE — Progress Notes (Signed)
Reason for visit: Peripheral neuropathy  Ann Rodriguez is an 83 y.o. female  History of present illness:  Ann Rodriguez is an 83 year old right-handed white female with a history of a peripheral neuropathy.  The patient does not have much discomfort, but she does have numbness in the feet, she feels as if there is something between her foot and the floor when she is walking barefooted.  She does not notice this as much when she is wearing a shoe.  She does not have pain in her feet at night, she is able to get to sleep fairly well, but she does have frequent, nightly leg cramps or foot cramps.  The patient takes magnesium without much benefit.  She has tried mustard dosing and soap placed in her bed.  The patient does have some minimal balance troubles, she does not use a cane and she has not had any falls.  She returns to this office for an evaluation.   Past Medical History:  Diagnosis Date  . Arthritis   . Depression    PT'S HUSBAND AND SON HAVE DIED W/IN PAST YEAR 2013  . GERD (gastroesophageal reflux disease)   . Hearing loss   . Hyperlipidemia   . Idiopathic peripheral neuropathy 07/18/2016  . Stroke (HCC)   . SVT (supraventricular tachycardia) (HCC) 09/2014  . Thyroid disease     Past Surgical History:  Procedure Laterality Date  . APPENDECTOMY    . BACK SURGERY     lumbar  . BLADDER SURGERY    . BUNIONECTOMY     left and right with hammer toe repair  . CARPAL TUNNEL RELEASE Bilateral 2017  . EYE SURGERY     bilateral cataract extraction with IOL  . FOOT SURGERY    . NASAL SINUS SURGERY    . OOPHORECTOMY    . ROTATOR CUFF REPAIR  1/13,  3/13   x 3  right/  . SALPINGECTOMY      Family History  Problem Relation Age of Onset  . Diabetes Mother   . Diabetes Son     Social history:  reports that she quit smoking about 42 years ago. Her smoking use included cigarettes. She smoked 0.00 packs per day for 0.00 years. she has never used smokeless tobacco. She reports that  she drinks about 0.6 oz of alcohol per week. She reports that she does not use drugs.   No Known Allergies  Medications:  Prior to Admission medications   Medication Sig Start Date End Date Taking? Authorizing Provider  aspirin EC 81 MG tablet Take 81 mg by mouth daily.   Yes [provider]  atorvastatin (LIPITOR) 40 MG tablet Take 40 mg by mouth every evening.    Yes [provider]  beta carotene w/minerals (OCUVITE) tablet Take 1 tablet by mouth daily with breakfast.    Yes [provider]  BIOTIN PO Take 1 tablet by mouth daily.   Yes [provider]  calcium carbonate (OS-CAL) 600 MG TABS Take 1,800 mg by mouth daily with breakfast.    Yes [provider]  cholecalciferol (VITAMIN D) 1000 UNITS tablet Take 1,000 Units by mouth daily.    Yes [provider]  esomeprazole (NEXIUM) 20 MG capsule Take 20 mg by mouth every morning.    Yes [provider]  oxyCODONE-acetaminophen (PERCOCET/ROXICET) 5-325 MG tablet Take 1 tablet by mouth every 6 (six) hours as needed for moderate pain.  03/23/15  Yes [provider]  polyethylene glycol (MIRALAX / GLYCOLAX) packet Take 17 g by mouth every other day.    Yes [provider]  vitamin B-12 (CYANOCOBALAMIN) 100 MCG tablet Take 100 mcg by mouth daily.   Yes [provider]    ROS:  Out of a complete 14 system review of symptoms, the patient complains only of the following symptoms, and all other reviewed systems are negative.  Hearing loss Incontinence of the bladder Insomnia, frequent waking Joint pain, muscle cramps Dizziness  Blood pressure 139/82, pulse 71, height 5\' 6"  (1.676 m), weight 148 lb (67.1 kg).  Physical Exam  General: The patient is alert and cooperative at the time of the examination.  Skin: No significant peripheral edema is noted.   Neurologic Exam  Mental status: The patient is alert and oriented x 3 at the time of the  examination. The patient has apparent normal recent and remote memory, with an apparently normal attention span and concentration ability.   Cranial nerves: Facial symmetry is present. Speech is normal, no aphasia or dysarthria is noted. Extraocular movements are full. Visual fields are full.  Motor: The patient has good strength in all 4 extremities.  Sensory examination: Soft touch sensation is symmetric on the face, arms, and legs.  Coordination: The patient has good finger-nose-finger and heel-to-shin bilaterally.  Gait and station: The patient has a normal gait. Tandem gait is unsteady. Romberg is negative. No drift is seen.  The patient is able to walk on heels and the toes bilaterally.  Reflexes: Deep tendon reflexes are symmetric, but are depressed.   Assessment/Plan:  1.  Gait disturbance  2.  Peripheral neuropathy  3.  Nocturnal leg cramps  The patient will be given a prescription for low-dose baclofen taking 5-10 mg at night if needed.  She does have cramps on a nightly basis.  She does not have pain otherwise with her neuropathy.  She will follow-up in 6 months.  She will call for any dose adjustments of the baclofen.   Marlan Palau. Keith Keyion Knack MD 01/22/2017 10:37 AM  [facility] [address] [place], [place] [address]  Phone [phone] Fax [phone]

## 2017-02-25 DIAGNOSIS — M545 Low back pain: Secondary | ICD-10-CM | POA: Diagnosis not present

## 2017-02-26 NOTE — Progress Notes (Deleted)
Cardiology Office Note    Date:  02/26/2017   ID:  Ann GoldmannJane S Rodriguez, DOB 06/24/1928, MRN [id number]  PCP:  Tally JoeSwayne, David, MD  Cardiologist: Lesleigh NoeHenry W Smith III, MD   No chief complaint on file.   History of Present Illness:  Ann Rodriguez is a 83 y.o. female who presents for follow-up of SVT s/p ablation, possible PAF(remote) without documentation on ECG, hypertension, and disastolic dysfunction. After SVT ablation, flecainide and anticoagulation was discontinued.      Past Medical History:  Diagnosis Date  . Arthritis   . Depression    PT'S HUSBAND AND SON HAVE DIED W/IN PAST YEAR 2013  . GERD (gastroesophageal reflux disease)   . Hearing loss   . Hyperlipidemia   . Idiopathic peripheral neuropathy 07/18/2016  . Nocturnal leg cramps 01/22/2017  . Stroke (HCC)   . SVT (supraventricular tachycardia) (HCC) 09/2014  . Thyroid disease     Past Surgical History:  Procedure Laterality Date  . APPENDECTOMY    . BACK SURGERY     lumbar  . BLADDER SURGERY    . BUNIONECTOMY     left and right with hammer toe repair  . CARPAL TUNNEL RELEASE Bilateral 2017  . ELECTROPHYSIOLOGIC STUDY N/A 11/03/2014   Procedure: SVT Ablation;  Surgeon: Marinus MawGregg W Taylor, MD;  [facility];  Service: Cardiovascular;  Laterality: N/A;  . EYE SURGERY     bilateral cataract extraction with IOL  . FOOT SURGERY    . KNEE ARTHROSCOPY  02/09/2011   Procedure: ARTHROSCOPY KNEE;  Surgeon: Jacki Conesonald A Gioffre;  Location: [facility];  Service: Orthopedics;  Laterality: Left;  Left Knee Arthroscopy with Menisectomy medial, abrasion chondroplasty medial, and synovectomy, supra patella pouch.  Marland Kitchen. NASAL SINUS SURGERY    . OOPHORECTOMY    . ROTATOR CUFF REPAIR  1/13,  3/13   x 3  right/  . SALPINGECTOMY    . SHOULDER OPEN ROTATOR CUFF REPAIR Left 06/26/2012   Procedure: LEFT SHOULDER ROTATOR CUFF REPAIR WITH ANCHORS ;  Surgeon: Jacki Conesonald A Gioffre, MD;  Location: [facility];  Service: Orthopedics;  Laterality: Left;    . TOTAL KNEE ARTHROPLASTY  09/05/2011   Procedure: TOTAL KNEE ARTHROPLASTY;  Surgeon: Jacki Conesonald A Gioffre, MD;  Location: [facility];  Service: Orthopedics;  Laterality: Left;    Current Medications: Outpatient Medications Prior to Visit  Medication Sig Dispense Refill  . aspirin EC 81 MG tablet Take 81 mg by mouth daily.    Marland Kitchen. atorvastatin (LIPITOR) 40 MG tablet Take 40 mg by mouth every evening.     . baclofen (LIORESAL) 10 MG tablet Take 1 tablet (10 mg total) at bedtime by mouth. 30 each 3  . beta carotene w/minerals (OCUVITE) tablet Take 1 tablet by mouth daily with breakfast.     . BIOTIN PO Take 1 tablet by mouth daily.    . calcium carbonate (OS-CAL) 600 MG TABS Take 1,800 mg by mouth daily with breakfast.     . cholecalciferol (VITAMIN D) 1000 UNITS tablet Take 1,000 Units by mouth daily.     Marland Kitchen. esomeprazole (NEXIUM) 20 MG capsule Take 20 mg by mouth every morning.     Marland Kitchen. oxyCODONE-acetaminophen (PERCOCET/ROXICET) 5-325 MG tablet Take 1 tablet by mouth every 6 (six) hours as needed for moderate pain.   0  . polyethylene glycol (MIRALAX / GLYCOLAX) packet Take 17 g by mouth every other day.     . vitamin B-12 (CYANOCOBALAMIN) 100 MCG tablet Take 100  mcg by mouth daily.     No facility-administered medications prior to visit.      Allergies:   Patient has no known allergies.   Social History   Socioeconomic History  . Marital status: Widowed    Spouse name: Not on file  . Number of children: 2  . Years of education: Not on file  . Highest education level: Not on file  Social Needs  . Financial resource strain: Not on file  . Food insecurity - worry: Not on file  . Food insecurity - inability: Not on file  . Transportation needs - medical: Not on file  . Transportation needs - non-medical: Not on file  Occupational History  . Occupation: Retired  Tobacco Use  . Smoking status: Former Smoker    Packs/day: 0.00    Years: 0.00    Pack years: 0.00    Types: Cigarettes    Last  attempt to quit: 02/04/1974    Years since quitting: 43.0  . Smokeless tobacco: Never Used  Substance and Sexual Activity  . Alcohol use: Yes    Alcohol/week: 0.6 oz    Types: 1 Glasses of wine per week    Comment: socially  . Drug use: No  . Sexual activity: Not on file  Other Topics Concern  . Not on file  Social History Narrative   Lives alone   Caffeine use: 4 drinks coffee per day   Right-handed     Family History:  The patient's ***family history includes Diabetes in her mother and son.   ROS:   Please see the history of present illness.    ***  All other systems reviewed and are negative.   PHYSICAL EXAM:   VS:  There were no vitals taken for this visit.   GEN: Well nourished, well developed, in no acute distress  HEENT: normal  Neck: no JVD, carotid bruits, or masses Cardiac: ***RRR; no murmurs, rubs, or gallops,no edema  Respiratory:  clear to auscultation bilaterally, normal work of breathing GI: soft, nontender, nondistended, + BS MS: no deformity or atrophy  Skin: warm and dry, no rash Neuro:  Alert and Oriented x 3, Strength and sensation are intact Psych: euthymic mood, full affect  Wt Readings from Last 3 Encounters:  01/22/17 148 lb (67.1 kg)  06/27/16 149 lb (67.6 kg)  02/27/16 145 lb 12.8 oz (66.1 kg)      Studies/Labs Reviewed:   EKG:  EKG  ***  Recent Labs: No results found for requested labs within last 8760 hours.   Lipid Panel    Component Value Date/Time   CHOL 161 11/03/2014 0450   TRIG 130 11/03/2014 0450   HDL 47 11/03/2014 0450   CHOLHDL 3.4 11/03/2014 0450   VLDL 26 11/03/2014 0450   LDLCALC 88 11/03/2014 0450    Additional studies/ records that were reviewed today include:  ***    ASSESSMENT:    1. Chronic diastolic heart failure (HCC)   2. Paroxysmal atrial fibrillation (HCC)   3. SVT (supraventricular tachycardia) (HCC)   4. TIA (transient ischemic attack)   5. Chronic anticoagulation   6. Dyspnea on effort        PLAN:  In order of problems listed above:  1. ***    Medication Adjustments/Labs and Tests Ordered: Current medicines are reviewed at length with the patient today.  Concerns regarding medicines are outlined above.  Medication changes, Labs and Tests ordered today are listed in the Patient Instructions below. There are  no Patient Instructions on file for this visit.   Signed, Lesleigh NoeHenry W Smith III, MD  02/26/2017 8:34 PM    [facility] [address], [place], [place]  [address] Phone: [phone]; Fax: [phone]

## 2017-02-27 ENCOUNTER — Ambulatory Visit: Payer: Medicare Other | Admitting: Interventional Cardiology

## 2017-02-28 DIAGNOSIS — M546 Pain in thoracic spine: Secondary | ICD-10-CM | POA: Diagnosis not present

## 2017-02-28 DIAGNOSIS — M25551 Pain in right hip: Secondary | ICD-10-CM | POA: Diagnosis not present

## 2017-02-28 DIAGNOSIS — K5903 Drug induced constipation: Secondary | ICD-10-CM | POA: Diagnosis not present

## 2017-03-01 ENCOUNTER — Emergency Department (HOSPITAL_COMMUNITY)
Admission: EM | Admit: 2017-03-01 | Discharge: 2017-03-01 | Disposition: A | Discharge status: Home / Self Care | Payer: Medicare Other | Attending: Emergency Medicine | Admitting: Emergency Medicine

## 2017-03-01 ENCOUNTER — Encounter (HOSPITAL_COMMUNITY): Payer: Self-pay | Admitting: Emergency Medicine

## 2017-03-01 ENCOUNTER — Emergency Department (HOSPITAL_COMMUNITY): Payer: Medicare Other

## 2017-03-01 ENCOUNTER — Other Ambulatory Visit: Payer: Self-pay

## 2017-03-01 DIAGNOSIS — Y929 Unspecified place or not applicable: Secondary | ICD-10-CM | POA: Insufficient documentation

## 2017-03-01 DIAGNOSIS — Z96652 Presence of left artificial knee joint: Secondary | ICD-10-CM | POA: Diagnosis not present

## 2017-03-01 DIAGNOSIS — R1031 Right lower quadrant pain: Secondary | ICD-10-CM | POA: Diagnosis not present

## 2017-03-01 DIAGNOSIS — K5900 Constipation, unspecified: Secondary | ICD-10-CM | POA: Diagnosis not present

## 2017-03-01 DIAGNOSIS — X509XXA Other and unspecified overexertion or strenuous movements or postures, initial encounter: Secondary | ICD-10-CM | POA: Insufficient documentation

## 2017-03-01 DIAGNOSIS — Z79899 Other long term (current) drug therapy: Secondary | ICD-10-CM | POA: Insufficient documentation

## 2017-03-01 DIAGNOSIS — Z7982 Long term (current) use of aspirin: Secondary | ICD-10-CM | POA: Diagnosis not present

## 2017-03-01 DIAGNOSIS — S22000A Wedge compression fracture of unspecified thoracic vertebra, initial encounter for closed fracture: Secondary | ICD-10-CM

## 2017-03-01 DIAGNOSIS — S22070A Wedge compression fracture of T9-T10 vertebra, initial encounter for closed fracture: Secondary | ICD-10-CM | POA: Diagnosis not present

## 2017-03-01 DIAGNOSIS — Y999 Unspecified external cause status: Secondary | ICD-10-CM | POA: Diagnosis not present

## 2017-03-01 DIAGNOSIS — Z87891 Personal history of nicotine dependence: Secondary | ICD-10-CM | POA: Diagnosis not present

## 2017-03-01 DIAGNOSIS — S299XXA Unspecified injury of thorax, initial encounter: Secondary | ICD-10-CM | POA: Diagnosis present

## 2017-03-01 DIAGNOSIS — Y9301 Activity, walking, marching and hiking: Secondary | ICD-10-CM | POA: Insufficient documentation

## 2017-03-01 LAB — CBC WITH DIFFERENTIAL/PLATELET
BASOS ABS: 0 10*3/uL (ref 0.0–0.1)
BASOS PCT: 0 %
Eosinophils Absolute: 0.1 10*3/uL (ref 0.0–0.7)
Eosinophils Relative: 1 %
HEMATOCRIT: 39.2 % (ref 36.0–46.0)
HEMOGLOBIN: 13 g/dL (ref 12.0–15.0)
LYMPHS PCT: 9 %
Lymphs Abs: 0.8 10*3/uL (ref 0.7–4.0)
MCH: 31.1 pg (ref 26.0–34.0)
MCHC: 33.2 g/dL (ref 30.0–36.0)
MCV: 93.8 fL (ref 78.0–100.0)
MONO ABS: 0.7 10*3/uL (ref 0.1–1.0)
Monocytes Relative: 9 %
NEUTROS ABS: 6.6 10*3/uL (ref 1.7–7.7)
NEUTROS PCT: 81 %
Platelets: 250 10*3/uL (ref 150–400)
RBC: 4.18 MIL/uL (ref 3.87–5.11)
RDW: 13.2 % (ref 11.5–15.5)
WBC: 8.2 10*3/uL (ref 4.0–10.5)

## 2017-03-01 LAB — COMPREHENSIVE METABOLIC PANEL
ALK PHOS: 75 U/L (ref 38–126)
ALT: 15 U/L (ref 14–54)
AST: 24 U/L (ref 15–41)
Albumin: 3.4 g/dL — ABNORMAL LOW (ref 3.5–5.0)
Anion gap: 10 (ref 5–15)
BILIRUBIN TOTAL: 1.1 mg/dL (ref 0.3–1.2)
BUN: 12 mg/dL (ref 6–20)
CALCIUM: 8.9 mg/dL (ref 8.9–10.3)
CO2: 25 mmol/L (ref 22–32)
CREATININE: 0.5 mg/dL (ref 0.44–1.00)
Chloride: 103 mmol/L (ref 101–111)
GFR calc Af Amer: >60 mL/min (ref >60)
GFR calc non Af Amer: >60 mL/min (ref >60)
GLUCOSE: 96 mg/dL (ref 65–99)
Potassium: 3.6 mmol/L (ref 3.5–5.1)
Sodium: 138 mmol/L (ref 135–145)
TOTAL PROTEIN: 6.9 g/dL (ref 6.5–8.1)

## 2017-03-01 LAB — LIPASE, BLOOD: Lipase: 15 U/L (ref 11–51)

## 2017-03-01 MED ORDER — SODIUM CHLORIDE 0.9 % IV BOLUS (SEPSIS)
1000.0000 mL | Freq: Once | INTRAVENOUS | Status: AC
  Administered 2017-03-01: 1000 mL via INTRAVENOUS

## 2017-03-01 MED ORDER — ACETAMINOPHEN 500 MG PO TABS: 1000.0000 mg | ORAL_TABLET | Freq: Once | ORAL | Status: DC

## 2017-03-01 MED ORDER — IOPAMIDOL (ISOVUE-300) INJECTION 61%
INTRAVENOUS | Status: AC
  Administered 2017-03-01: 100 mL via INTRAVENOUS
  Filled 2017-03-01: qty 100

## 2017-03-01 MED ORDER — MORPHINE SULFATE (PF) 4 MG/ML IV SOLN
4.0000 mg | Freq: Once | INTRAVENOUS | Status: AC
  Administered 2017-03-01: 4 mg via INTRAVENOUS
  Filled 2017-03-01: qty 1

## 2017-03-01 MED ORDER — ONDANSETRON HCL 4 MG/2ML IJ SOLN
4.0000 mg | Freq: Once | INTRAMUSCULAR | Status: AC
  Administered 2017-03-01: 4 mg via INTRAVENOUS
  Filled 2017-03-01: qty 2

## 2017-03-01 NOTE — ED Triage Notes (Signed)
Pt complaint of constipation for a week; recently put on pain killers causing such; unrelieved by OTC laxatives.

## 2017-03-01 NOTE — ED Notes (Signed)
Bed: WA06 Expected date:  Expected time:  Means of arrival:  Comments: 

## 2017-03-01 NOTE — ED Provider Notes (Signed)
[facility]-EMERGENCY DEPT Provider Note   CSN: [id number] Arrival date & time: 03/01/17  1000     History   Chief Complaint Chief Complaint  Patient presents with  . Constipation    HPI Rodena GoldmannJane S Valcarcel is a 83 y.o. female.  Patient is an 83 year old female with a history of hyperlipidemia, peripheral myopathy, stroke, SVT, thyroid disease presenting today with constipation.  Patient states that approximately 1 week ago she had a mechanical fall where she was walking and her leg slipped causing her to fall on her right hip and back area.  She followed up with her doctor on Monday and had plain x-rays which were negative.  She was started on hydrocodone.  Patient states her last bowel movement was Friday which was 7 days ago.  Over the weekend she did not have a bowel movement so she started taking Dulcolax and Colace.  She is still not had a bowel movement by Tuesday so spoke with her doctor and they recommended doing magnesium citrate and a Fleet's enema.  Patient was not comfortable that with this because she was having abdominal distention and nausea.  She saw her doctor yesterday and they recommended for her to take those medications.  She was still having a lot of pain in her right hip and back worse with movement but no urinary complaints.  They increased her hydrocodone to oxycodone.  This morning patient attempted to do the Fleet's enema but was unsuccessful in having stool.  She came here for further evaluation.  She is still complaining of feeling bloated and distended.  She has no localized abdominal pain but complains of a lot of right-sided pelvic pain and lower back pain.  She has no numbness or tingling going down her leg.  Movement makes the pain worse.  No fever, dysuria or frequency.   The history is provided by the patient.  Constipation   This is a new problem. Episode onset: 6 days. Stool description: nothing. Associated symptoms include abdominal pain  and flatus. Pertinent negatives include no dysuria. There has been adequate water intake. She has tried stimulants and osmotic agents for the symptoms. The treatment provided no relief. Risk factors include immobility (recent mechanical fall last week and placed on hydrocodone).    Past Medical History:  Diagnosis Date  . Arthritis   . Depression    PT'S HUSBAND AND SON HAVE DIED W/IN PAST YEAR 2013  . GERD (gastroesophageal reflux disease)   . Hearing loss   . Hyperlipidemia   . Idiopathic peripheral neuropathy 07/18/2016  . Nocturnal leg cramps 01/22/2017  . Stroke (HCC)   . SVT (supraventricular tachycardia) (HCC) 09/2014  . Thyroid disease     Patient Active Problem List   Diagnosis Date Noted  . Nocturnal leg cramps 01/22/2017  . Idiopathic peripheral neuropathy 07/18/2016  . Paresthesia of both lower extremities 06/27/2016  . Chronic anticoagulation 11/28/2014  . TIA (transient ischemic attack) 11/03/2014  . SVT (supraventricular tachycardia) (HCC) 09/29/2014  . Chronic diastolic heart failure (HCC) 08/11/2013  . Paroxysmal atrial fibrillation (HCC) 01/26/2013    Class: Chronic  . Dyspnea on effort 01/26/2013    Class: Acute  . Swelling of limb 09/02/2012  . AC joint arthropathy 06/26/2012  . Rotator cuff (capsule) sprain 06/26/2012  . Villonodular synovitis, shoulder region 06/26/2012  . Osteoarthritis of left knee 09/05/2011  . Osteoarthritis of knee 02/09/2011    Past Surgical History:  Procedure Laterality Date  . APPENDECTOMY    .  BACK SURGERY     lumbar  . BLADDER SURGERY    . BUNIONECTOMY     left and right with hammer toe repair  . CARPAL TUNNEL RELEASE Bilateral 2017  . ELECTROPHYSIOLOGIC STUDY N/A 11/03/2014   Procedure: SVT Ablation;  Surgeon: Marinus Maw, MD;  Location: [facility] INVASIVE CV LAB;  Service: Cardiovascular;  Laterality: N/A;  . EYE SURGERY     bilateral cataract extraction with IOL  . FOOT SURGERY    . KNEE ARTHROSCOPY  02/09/2011    Procedure: ARTHROSCOPY KNEE;  Surgeon: Jacki Cones;  Location: [facility];  Service: Orthopedics;  Laterality: Left;  Left Knee Arthroscopy with Menisectomy medial, abrasion chondroplasty medial, and synovectomy, supra patella pouch.  Marland Kitchen NASAL SINUS SURGERY    . OOPHORECTOMY    . ROTATOR CUFF REPAIR  1/13,  3/13   x 3  right/  . SALPINGECTOMY    . SHOULDER OPEN ROTATOR CUFF REPAIR Left 06/26/2012   Procedure: LEFT SHOULDER ROTATOR CUFF REPAIR WITH ANCHORS ;  Surgeon: Jacki Cones, MD;  Location: [facility];  Service: Orthopedics;  Laterality: Left;  . TOTAL KNEE ARTHROPLASTY  09/05/2011   Procedure: TOTAL KNEE ARTHROPLASTY;  Surgeon: Jacki Cones, MD;  Location: [facility];  Service: Orthopedics;  Laterality: Left;    OB History    No data available       Home Medications    Prior to Admission medications   Medication Sig Start Date End Date Taking? Authorizing Provider  aspirin EC 81 MG tablet Take 81 mg by mouth daily.   Yes [provider]  atorvastatin (LIPITOR) 40 MG tablet Take 40 mg by mouth every evening.    Yes [provider]  baclofen (LIORESAL) 10 MG tablet Take 1 tablet (10 mg total) at bedtime by mouth. 01/22/17  Yes York Spaniel, MD  beta carotene w/minerals (OCUVITE) tablet Take 1 tablet by mouth daily with breakfast.    Yes [provider]  BIOTIN PO Take 1 tablet by mouth daily.   Yes [provider]  calcium carbonate (OS-CAL) 600 MG TABS Take 1,800 mg by mouth daily with breakfast.    Yes [provider]  cholecalciferol (VITAMIN D) 1000 UNITS tablet Take 1,000 Units by mouth daily.    Yes [provider]  esomeprazole (NEXIUM) 20 MG capsule Take 20 mg by mouth every morning.    Yes [provider]  oxyCODONE-acetaminophen (PERCOCET) 10-325 MG tablet Take 1 tablet by mouth every 6 (six) hours as needed for moderate pain.  03/23/15  Yes [provider]  polyethylene glycol (MIRALAX / GLYCOLAX)  packet Take 17 g by mouth every other day.    Yes [provider]  vitamin B-12 (CYANOCOBALAMIN) 100 MCG tablet Take 100 mcg by mouth daily.   Yes [provider]    Family History Family History  Problem Relation Age of Onset  . Diabetes Mother   . Diabetes Son     Social History Social History   Tobacco Use  . Smoking status: Former Smoker    Packs/day: 0.00    Years: 0.00    Pack years: 0.00    Types: Cigarettes    Last attempt to quit: 02/04/1974    Years since quitting: 43.0  . Smokeless tobacco: Never Used  Substance Use Topics  . Alcohol use: Yes    Alcohol/week: 0.6 oz    Types: 1 Glasses of wine per week    Comment: socially  .  Drug use: No     Allergies   Patient has no known allergies.   Review of Systems Review of Systems  Gastrointestinal: Positive for abdominal pain, constipation and flatus.  Genitourinary: Negative for dysuria.  All other systems reviewed and are negative.    Physical Exam Updated Vital Signs BP (!) 168/54 (BP Location: Right Arm)   Pulse 62   Temp 98.4 F (36.9 C) (Oral)   Resp 16   SpO2 98%   Physical Exam  Constitutional: She is oriented to person, place, and time. She appears well-developed and well-nourished. No distress.  HENT:  Head: Normocephalic and atraumatic.  Mouth/Throat: Oropharynx is clear and moist.  Eyes: Conjunctivae and EOM are normal. Pupils are equal, round, and reactive to light.  Neck: Normal range of motion. Neck supple.  Cardiovascular: Normal rate, regular rhythm and intact distal pulses.  No murmur heard. Pulmonary/Chest: Effort normal and breath sounds normal. No respiratory distress. She has no wheezes. She has no rales.  Abdominal: Soft. She exhibits distension. There is no tenderness. There is no rebound and no guarding.  Musculoskeletal: Normal range of motion. She exhibits tenderness. She exhibits no edema.       Right hip: She exhibits tenderness and bony tenderness.  She exhibits normal range of motion.       Lumbar back: She exhibits tenderness and bony tenderness.       Back:       Legs: Neurological: She is alert and oriented to person, place, and time.  5 out of 5 strength in the bilateral lower extremities.  Sensation intact.  Skin: Skin is warm and dry. No rash noted. No erythema.  Psychiatric: She has a normal mood and affect. Her behavior is normal.  Nursing note and vitals reviewed.    ED Treatments / Results  Labs (all labs ordered are listed, but only abnormal results are displayed) Labs Reviewed  COMPREHENSIVE METABOLIC PANEL - Abnormal; Notable for the following components:      Result Value   Albumin 3.4 (*)    All other components within normal limits  CBC WITH DIFFERENTIAL/PLATELET  LIPASE, BLOOD    EKG  EKG Interpretation None       Radiology Ct Abdomen Pelvis W Contrast  Result Date: 03/01/2017 CLINICAL DATA:  Pt complaint of constipation for a week; recently put on pain killers causing such; unrelieved by OTC laxativesSevere back s/p fall 5 days ago EXAM: CT ABDOMEN AND PELVIS WITH CONTRAST TECHNIQUE: Multidetector CT imaging of the abdomen and pelvis was performed using the standard protocol following bolus administration of intravenous contrast. CONTRAST:  <See Chart> ISOVUE-300 IOPAMIDOL (ISOVUE-300) INJECTION 61% COMPARISON:  Lumbar radiographs, 02/06/2016.  CT, 05/26/2009. FINDINGS: Lower chest: Heart mildly enlarged. Trace pleural effusions, greater on the right. Mild dependent subsegmental atelectasis, greater on the right. No convincing pneumonia. No pulmonary edema. Small sliding hiatal hernia. Hepatobiliary: Liver is unremarkable. Gallbladder is distended. There is dependent mild increased attenuation material in the lower gallbladder that is likely sludge. No wall thickening or adjacent inflammation. No bile duct dilation. Pancreas: No pancreatic masses or inflammation. Spleen: Normal in size without focal  abnormality. Adrenals/Urinary Tract: No adrenal masses. There are several small low-density renal masses consistent with cysts. No stones. No hydronephrosis. Symmetric renal enhancement and excretion. Ureters normal course and caliber. Bladder is unremarkable. Stomach/Bowel: Small hiatal hernia. Stomach otherwise unremarkable. Small bowel is within normal limits. There is moderate increased stool throughout the colon. No colonic wall thickening or inflammation. Vascular/Lymphatic: Aortic  atherosclerosis. No enlarged abdominal or pelvic lymph nodes. Reproductive: Status post hysterectomy. No adnexal masses. Other: No abdominal wall hernia or abnormality. No abdominopelvic ascites. Musculoskeletal: Mild compression fracture of T9, not completely visualized being at the superior most margin of the included field of view. This is not evident on the prior lumbar radiographs, which included T9. No other evidence of an acute fracture. Chronic pars defects at L5-S1 with a grade 1 to grade 2 anterolisthesis. There are degenerative changes throughout the visualized spine as well as a lumbar dextroscoliosis. No osteoblastic or osteolytic lesions. IMPRESSION: 1. Mild compression fracture of T9, presumed acute given the patient's history. 2. No other evidence of an acute abnormality within the abdomen or pelvis. 3. Moderate increased stool in the colon. No bowel obstruction or inflammation. 4. Aortic atherosclerosis. Electronically Signed   By: Amie Portland M.D.   On: 03/01/2017 14:25    Procedures Procedures (including critical care time)  Medications Ordered in ED Medications  sodium chloride 0.9 % bolus 1,000 mL (1,000 mLs Intravenous New Bag/Given 03/01/17 1105)  acetaminophen (TYLENOL) tablet 1,000 mg (not administered)     Initial Impression / Assessment and Plan / ED Course  I have reviewed the triage vital signs and the nursing notes.  Pertinent labs & imaging results that were available during my care  of the patient were reviewed by me and considered in my medical decision making (see chart for details).     Patient with history of constipation for the last 6 days that started after a mechanical fall.  She has been on narcotics which is most likely not helping with her constipation however concern for possible pelvic or lumbar fracture that may also be causing constipation.  The patient has not been as mobile either.  She is not displaying symptoms of bowel obstruction.  She has no localized abdominal pain.  She appears slightly dehydrated but is hemodynamically stable.  Patient has no stool in her rectal vault.  CBC is within normal limits.  CMP and CT of the abdomen and pelvis are pending.  CT of the abdomen and pelvis to rule out obstruction but also rule out occult pelvic or lumbar fractures.  2:55 PM Patient CT is showing a new acute T9 compression fracture.  Patient has no other acute fractures.  No pelvic fractures.  CT is consistent with constipation.  Patient's labs are reassuring.  Vital signs are reassuring.  Will have patient increase her MiraLAX.  She does have pain medication at home.  Discussed alternatives to narcotics such as lidocaine patches, Tylenol.  Also discussed kyphoplasty if her pain does not improve.  Gave patient strict return precautions.  Final Clinical Impressions(s) / ED Diagnoses   Final diagnoses:  Constipation, unspecified constipation type  Closed compression fracture of thoracic vertebra, initial encounter [facility])    ED Discharge Orders    None       Gwyneth Sprout, MD 03/01/17 1457

## 2017-03-01 NOTE — Discharge Instructions (Signed)
Start using 3 Of MiraLAX in 8 Ounces of Water Every 6 Hours until You Are Able to Have a Bowel Movement.  Then Used 3 Caps Daily So You Can Have Regular Bowel Movements While You Are Having to Take the Narcotic Pain Medication for Your Thoracic Compression Fracture.   If you develop vomiting, severe abdominal pain, shortness of breath or other concerns return to the emergency room immediately.

## 2017-03-08 DIAGNOSIS — S22070A Wedge compression fracture of T9-T10 vertebra, initial encounter for closed fracture: Secondary | ICD-10-CM | POA: Diagnosis not present

## 2017-03-08 DIAGNOSIS — K5903 Drug induced constipation: Secondary | ICD-10-CM | POA: Diagnosis not present

## 2017-03-08 DIAGNOSIS — M810 Age-related osteoporosis without current pathological fracture: Secondary | ICD-10-CM | POA: Diagnosis not present

## 2017-03-08 DIAGNOSIS — R030 Elevated blood-pressure reading, without diagnosis of hypertension: Secondary | ICD-10-CM | POA: Diagnosis not present

## 2017-03-15 ENCOUNTER — Other Ambulatory Visit: Payer: Self-pay | Admitting: Orthopaedic Surgery

## 2017-03-15 DIAGNOSIS — Z6824 Body mass index (BMI) 24.0-24.9, adult: Secondary | ICD-10-CM | POA: Diagnosis not present

## 2017-03-15 DIAGNOSIS — M546 Pain in thoracic spine: Secondary | ICD-10-CM | POA: Diagnosis not present

## 2017-03-15 DIAGNOSIS — S22070A Wedge compression fracture of T9-T10 vertebra, initial encounter for closed fracture: Secondary | ICD-10-CM

## 2017-03-15 DIAGNOSIS — M810 Age-related osteoporosis without current pathological fracture: Secondary | ICD-10-CM | POA: Diagnosis not present

## 2017-03-17 ENCOUNTER — Ambulatory Visit
Admission: RE | Admit: 2017-03-17 | Discharge: 2017-03-17 | Disposition: A | Discharge status: Home / Self Care | Payer: Medicare Other | Source: Ambulatory Visit | Attending: Orthopaedic Surgery | Admitting: Orthopaedic Surgery

## 2017-03-17 DIAGNOSIS — S22070A Wedge compression fracture of T9-T10 vertebra, initial encounter for closed fracture: Secondary | ICD-10-CM

## 2017-03-17 DIAGNOSIS — S22080A Wedge compression fracture of T11-T12 vertebra, initial encounter for closed fracture: Secondary | ICD-10-CM | POA: Diagnosis not present

## 2017-03-26 DIAGNOSIS — S22080A Wedge compression fracture of T11-T12 vertebra, initial encounter for closed fracture: Secondary | ICD-10-CM | POA: Diagnosis not present

## 2017-03-26 DIAGNOSIS — S22070A Wedge compression fracture of T9-T10 vertebra, initial encounter for closed fracture: Secondary | ICD-10-CM | POA: Diagnosis not present

## 2017-03-26 DIAGNOSIS — M810 Age-related osteoporosis without current pathological fracture: Secondary | ICD-10-CM | POA: Diagnosis not present

## 2017-03-26 DIAGNOSIS — Z6824 Body mass index (BMI) 24.0-24.9, adult: Secondary | ICD-10-CM | POA: Diagnosis not present

## 2017-03-27 DIAGNOSIS — S22080A Wedge compression fracture of T11-T12 vertebra, initial encounter for closed fracture: Secondary | ICD-10-CM | POA: Diagnosis not present

## 2017-03-27 DIAGNOSIS — S22070A Wedge compression fracture of T9-T10 vertebra, initial encounter for closed fracture: Secondary | ICD-10-CM | POA: Diagnosis not present

## 2017-04-01 DIAGNOSIS — R030 Elevated blood-pressure reading, without diagnosis of hypertension: Secondary | ICD-10-CM | POA: Diagnosis not present

## 2017-04-01 DIAGNOSIS — S22070A Wedge compression fracture of T9-T10 vertebra, initial encounter for closed fracture: Secondary | ICD-10-CM | POA: Diagnosis not present

## 2017-04-01 DIAGNOSIS — F419 Anxiety disorder, unspecified: Secondary | ICD-10-CM | POA: Diagnosis not present

## 2017-04-14 NOTE — Progress Notes (Signed)
Cardiology Office Note    Date:  04/15/2017   ID:  Ann Rodriguez, MRN [id number]  PCP:  Tally JoeSwayne, David, MD  Cardiologist: Lesleigh NoeHenry W Smith III, MD   Chief Complaint  Patient presents with  . Irregular Heart Beat    SVT    History of Present Illness:  Ann Rodriguez is a 84 y.o. female who presents for follow-up of SVT s/p ablation, possible PAF(remote) without documentation on ECG, hypertension, and disastolic dysfunction. After SVT ablation, flecainide and anticoagulation was discontinued.   She is doing well.  She has had no recurrence of tachycardia.  She denies chest pain.  Has not had syncope.  No transient neurological symptoms.  Overall she is doing well.   Past Medical History:  Diagnosis Date  . Arthritis   . Depression    PT'S HUSBAND AND SON HAVE DIED W/IN PAST YEAR 2013  . GERD (gastroesophageal reflux disease)   . Hearing loss   . Hyperlipidemia   . Idiopathic peripheral neuropathy 07/18/2016  . Nocturnal leg cramps 01/22/2017  . Stroke (HCC)   . SVT (supraventricular tachycardia) (HCC) 09/2014  . Thyroid disease     Past Surgical History:  Procedure Laterality Date  . APPENDECTOMY    . BACK SURGERY     lumbar  . BLADDER SURGERY    . BUNIONECTOMY     left and right with hammer toe repair  . CARPAL TUNNEL RELEASE Bilateral 2017  . ELECTROPHYSIOLOGIC STUDY N/A 11/03/2014   Procedure: SVT Ablation;  Surgeon: Marinus MawGregg W Taylor, MD;  [facility];  Service: Cardiovascular;  Laterality: N/A;  . EYE SURGERY     bilateral cataract extraction with IOL  . FOOT SURGERY    . KNEE ARTHROSCOPY  02/09/2011   Procedure: ARTHROSCOPY KNEE;  Surgeon: Jacki Conesonald A Gioffre;  Location: [facility];  Service: Orthopedics;  Laterality: Left;  Left Knee Arthroscopy with Menisectomy medial, abrasion chondroplasty medial, and synovectomy, supra patella pouch.  Marland Kitchen. NASAL SINUS SURGERY    . OOPHORECTOMY    . ROTATOR CUFF REPAIR  1/13,  3/13   x 3  right/  .  SALPINGECTOMY    . SHOULDER OPEN ROTATOR CUFF REPAIR Left 06/26/2012   Procedure: LEFT SHOULDER ROTATOR CUFF REPAIR WITH ANCHORS ;  Surgeon: Jacki Conesonald A Gioffre, MD;  Location: [facility];  Service: Orthopedics;  Laterality: Left;  . TOTAL KNEE ARTHROPLASTY  09/05/2011   Procedure: TOTAL KNEE ARTHROPLASTY;  Surgeon: Jacki Conesonald A Gioffre, MD;  Location: [facility];  Service: Orthopedics;  Laterality: Left;    Current Medications: Outpatient Medications Prior to Visit  Medication Sig Dispense Refill  . aspirin EC 81 MG tablet Take 81 mg by mouth daily.    Marland Kitchen. atorvastatin (LIPITOR) 40 MG tablet Take 40 mg by mouth every evening.     . baclofen (LIORESAL) 10 MG tablet Take 1 tablet (10 mg total) at bedtime by mouth. 30 each 3  . beta carotene w/minerals (OCUVITE) tablet Take 1 tablet by mouth daily with breakfast.     . BIOTIN PO Take 1 tablet by mouth daily.    . calcium carbonate (OS-CAL) 600 MG TABS Take 1,800 mg by mouth daily with breakfast.     . cholecalciferol (VITAMIN D) 1000 UNITS tablet Take 1,000 Units by mouth daily.     . clonazePAM (KLONOPIN) 1 MG tablet Take 0.5 tablets by mouth daily.  0  . escitalopram (LEXAPRO) 5 MG tablet Take 5 mg by mouth daily.  0  . esomeprazole (NEXIUM) 20 MG capsule Take 20 mg by mouth every morning.     Marland Kitchen oxyCODONE-acetaminophen (PERCOCET) 10-325 MG tablet Take 1 tablet by mouth every 6 (six) hours as needed for moderate pain.   0  . polyethylene glycol (MIRALAX / GLYCOLAX) packet Take 17 g by mouth every other day.     . vitamin B-12 (CYANOCOBALAMIN) 100 MCG tablet Take 100 mcg by mouth daily.     No facility-administered medications prior to visit.      Allergies:   Patient has no known allergies.   Social History   Socioeconomic History  . Marital status: Widowed    Spouse name: None  . Number of children: 2  . Years of education: None  . Highest education level: None  Social Needs  . Financial resource strain: None  . Food insecurity - worry: None  .  Food insecurity - inability: None  . Transportation needs - medical: None  . Transportation needs - non-medical: None  Occupational History  . Occupation: Retired  Tobacco Use  . Smoking status: Former Smoker    Packs/day: 0.00    Years: 0.00    Pack years: 0.00    Types: Cigarettes    Last attempt to quit: 02/04/1974    Years since quitting: 43.2  . Smokeless tobacco: Never Used  Substance and Sexual Activity  . Alcohol use: Yes    Alcohol/week: 0.6 oz    Types: 1 Glasses of wine per week    Comment: socially  . Drug use: No  . Sexual activity: None  Other Topics Concern  . None  Social History Narrative   Lives alone   Caffeine use: 4 drinks coffee per day   Right-handed     Family History:  The patient's family history includes Diabetes in her mother and son.   ROS:   Please see the history of present illness.    Fell on a wooden ramp earlier this year.  2 vertebral fractures occurred requiring kyphoplasty.  Had difficulty sleeping since then.  Excessive fatigue.  Worried that she may develop anxiety attacks that she used to have when she was younger.  Recently started on escitalopram and clonazepam. All other systems reviewed and are negative.   PHYSICAL EXAM:   VS:  BP 132/82   Pulse 71   Ht 5\' 6"  (1.676 m)   Wt 141 lb 12.8 oz (64.3 kg)   BMI 22.89 kg/m    GEN: Well nourished, well developed, in no acute distress  HEENT: normal  Neck: no JVD, carotid bruits, or masses Cardiac: RRR; no murmurs, rubs, or gallops,no edema  Respiratory:  clear to auscultation bilaterally, normal work of breathing GI: soft, nontender, nondistended, + BS MS: no deformity or atrophy  Skin: warm and dry, no rash Neuro:  Alert and Oriented x 3, Strength and sensation are intact Psych: euthymic mood, full affect  Wt Readings from Last 3 Encounters:  04/15/17 141 lb 12.8 oz (64.3 kg)  01/22/17 148 lb (67.1 kg)  06/27/16 149 lb (67.6 kg)      Studies/Labs Reviewed:   EKG:  EKG  sinus rhythm, premature atrial contractions, right bundle branch block with left axis deviation.  Recent Labs: 03/01/2017: ALT 15; BUN 12; Creatinine, Ser 0.50; Hemoglobin 13.0; Platelets 250; Potassium 3.6; Sodium 138   Lipid Panel    Component Value Date/Time   CHOL 161 11/03/2014 0450   TRIG 130 11/03/2014 0450   HDL 47 11/03/2014 0450  CHOLHDL 3.4 11/03/2014 0450   VLDL 26 11/03/2014 0450   LDLCALC 88 11/03/2014 0450    Additional studies/ records that were reviewed today include:  none    ASSESSMENT:    1. Chronic diastolic heart failure (HCC)   2. Paroxysmal atrial fibrillation (HCC)   3. Chronic anticoagulation      PLAN:  In order of problems listed above:  1. No evidence of volume overload. 2. We will remove atrial fibrillation from her problem list.  Prior history of SVT which under possible 3. Has been discontinued.  Continue current minimal therapy with aspirin and Lipitor for risk factor modification  Medication Adjustments/Labs and Tests Ordered: Current medicines are reviewed at length with the patient today.  Concerns regarding medicines are outlined above.  Medication changes, Labs and Tests ordered today are listed in the Patient Instructions below. Patient Instructions  Medication Instructions:  Your physician recommends that you continue on your current medications as directed. Please refer to the Current Medication list given to you today.   Labwork: None  Testing/Procedures: None  Follow-Up: Your physician wants you to follow-up in: 1 year with Dr. Katrinka Blazing.  You will receive a reminder letter in the mail two months in advance. If you don't receive a letter, please call our office to schedule the follow-up appointment.   Any Other Special Instructions Will Be Listed Below (If Applicable).     If you need a refill on your cardiac medications before your next appointment, please call your pharmacy.      Signed, Lesleigh Noe, MD    04/15/2017 2:06 PM    [facility] [address] [place], [place], [place]  [address] Phone: [phone]; Fax: [phone]

## 2017-04-15 ENCOUNTER — Encounter: Payer: Self-pay | Admitting: Interventional Cardiology

## 2017-04-15 ENCOUNTER — Ambulatory Visit: Payer: Medicare Other | Admitting: Interventional Cardiology

## 2017-04-15 VITALS — BP 132/82 | HR 71 | Ht 66.0 in | Wt 141.8 lb

## 2017-04-15 DIAGNOSIS — I480 Paroxysmal atrial fibrillation: Secondary | ICD-10-CM | POA: Diagnosis not present

## 2017-04-15 DIAGNOSIS — Z7901 Long term (current) use of anticoagulants: Secondary | ICD-10-CM

## 2017-04-15 DIAGNOSIS — I5032 Chronic diastolic (congestive) heart failure: Secondary | ICD-10-CM

## 2017-04-15 NOTE — Patient Instructions (Signed)

## 2017-04-17 DIAGNOSIS — M47817 Spondylosis without myelopathy or radiculopathy, lumbosacral region: Secondary | ICD-10-CM | POA: Diagnosis not present

## 2017-04-17 DIAGNOSIS — M546 Pain in thoracic spine: Secondary | ICD-10-CM | POA: Diagnosis not present

## 2017-04-17 DIAGNOSIS — M4150 Other secondary scoliosis, site unspecified: Secondary | ICD-10-CM | POA: Diagnosis not present

## 2017-04-17 DIAGNOSIS — M545 Low back pain: Secondary | ICD-10-CM | POA: Diagnosis not present

## 2017-04-23 ENCOUNTER — Other Ambulatory Visit: Payer: Self-pay | Admitting: Family Medicine

## 2017-04-23 ENCOUNTER — Ambulatory Visit
Admission: RE | Admit: 2017-04-23 | Discharge: 2017-04-23 | Disposition: A | Discharge status: Home / Self Care | Payer: Medicare Other | Source: Ambulatory Visit | Attending: Family Medicine | Admitting: Family Medicine

## 2017-04-23 DIAGNOSIS — S63252A Unspecified dislocation of right middle finger, initial encounter: Secondary | ICD-10-CM

## 2017-04-23 DIAGNOSIS — S63284A Dislocation of proximal interphalangeal joint of right ring finger, initial encounter: Secondary | ICD-10-CM | POA: Diagnosis not present

## 2017-05-02 DIAGNOSIS — M79644 Pain in right finger(s): Secondary | ICD-10-CM | POA: Diagnosis not present

## 2017-05-02 DIAGNOSIS — H6122 Impacted cerumen, left ear: Secondary | ICD-10-CM | POA: Diagnosis not present

## 2017-05-02 DIAGNOSIS — F419 Anxiety disorder, unspecified: Secondary | ICD-10-CM | POA: Diagnosis not present

## 2017-05-16 DIAGNOSIS — M4150 Other secondary scoliosis, site unspecified: Secondary | ICD-10-CM | POA: Diagnosis not present

## 2017-05-16 DIAGNOSIS — M546 Pain in thoracic spine: Secondary | ICD-10-CM | POA: Diagnosis not present

## 2017-05-16 DIAGNOSIS — Z6824 Body mass index (BMI) 24.0-24.9, adult: Secondary | ICD-10-CM | POA: Diagnosis not present

## 2017-05-16 DIAGNOSIS — M545 Low back pain: Secondary | ICD-10-CM | POA: Diagnosis not present

## 2017-05-20 DIAGNOSIS — H353111 Nonexudative age-related macular degeneration, right eye, early dry stage: Secondary | ICD-10-CM | POA: Diagnosis not present

## 2017-05-20 DIAGNOSIS — H353131 Nonexudative age-related macular degeneration, bilateral, early dry stage: Secondary | ICD-10-CM | POA: Diagnosis not present

## 2017-05-20 DIAGNOSIS — H353121 Nonexudative age-related macular degeneration, left eye, early dry stage: Secondary | ICD-10-CM | POA: Diagnosis not present

## 2017-05-20 DIAGNOSIS — H43813 Vitreous degeneration, bilateral: Secondary | ICD-10-CM | POA: Diagnosis not present

## 2017-05-23 DIAGNOSIS — M79644 Pain in right finger(s): Secondary | ICD-10-CM | POA: Diagnosis not present

## 2017-07-08 DIAGNOSIS — S86111A Strain of other muscle(s) and tendon(s) of posterior muscle group at lower leg level, right leg, initial encounter: Secondary | ICD-10-CM | POA: Diagnosis not present

## 2017-07-24 ENCOUNTER — Ambulatory Visit: Payer: Medicare Other | Admitting: Adult Health

## 2017-07-24 ENCOUNTER — Telehealth: Payer: Self-pay | Admitting: *Deleted

## 2017-07-24 NOTE — Progress Notes (Deleted)
PATIENT: Ann Rodriguez DOB: 17-Sep-1928  REASON FOR VISIT: follow up HISTORY FROM: patient  HISTORY OF PRESENT ILLNESS: Today 07/24/17  HISTORY Ann Rodriguez is an 84 year old right-handed white female with a history of a peripheral neuropathy.  The patient does not have much discomfort, but she does have numbness in the feet, she feels as if there is something between her foot and the floor when she is walking barefooted.  She does not notice this as much when she is wearing a shoe.  She does not have pain in her feet at night, she is able to get to sleep fairly well, but she does have frequent, nightly leg cramps or foot cramps.  The patient takes magnesium without much benefit.  She has tried mustard dosing and soap placed in her bed.  The patient does have some minimal balance troubles, she does not use a cane and she has not had any falls.  She returns to this office for an evaluation.    REVIEW OF SYSTEMS: Out of a complete 14 system review of symptoms, the patient complains only of the following symptoms, and all other reviewed systems are negative.  ALLERGIES: No Known Allergies  HOME MEDICATIONS: Outpatient Medications Prior to Visit  Medication Sig Dispense Refill  . aspirin EC 81 MG tablet Take 81 mg by mouth daily.    Marland Kitchen atorvastatin (LIPITOR) 40 MG tablet Take 40 mg by mouth every evening.     . baclofen (LIORESAL) 10 MG tablet Take 1 tablet (10 mg total) at bedtime by mouth. 30 each 3  . beta carotene w/minerals (OCUVITE) tablet Take 1 tablet by mouth daily with breakfast.     . BIOTIN PO Take 1 tablet by mouth daily.    . calcium carbonate (OS-CAL) 600 MG TABS Take 1,800 mg by mouth daily with breakfast.     . cholecalciferol (VITAMIN D) 1000 UNITS tablet Take 1,000 Units by mouth daily.     . clonazePAM (KLONOPIN) 1 MG tablet Take 0.5 tablets by mouth daily.  0  . escitalopram (LEXAPRO) 5 MG tablet Take 5 mg by mouth daily.  0  . esomeprazole (NEXIUM) 20 MG capsule Take  20 mg by mouth every morning.     Marland Kitchen oxyCODONE-acetaminophen (PERCOCET) 10-325 MG tablet Take 1 tablet by mouth every 6 (six) hours as needed for moderate pain.   0  . polyethylene glycol (MIRALAX / GLYCOLAX) packet Take 17 g by mouth every other day.     . vitamin B-12 (CYANOCOBALAMIN) 100 MCG tablet Take 100 mcg by mouth daily.     No facility-administered medications prior to visit.     PAST MEDICAL HISTORY: Past Medical History:  Diagnosis Date  . Arthritis   . Depression    PT'S HUSBAND AND SON HAVE DIED W/IN PAST YEAR 2011-07-22  . GERD (gastroesophageal reflux disease)   . Hearing loss   . Hyperlipidemia   . Idiopathic peripheral neuropathy 07/18/2016  . Nocturnal leg cramps 01/22/2017  . Stroke (HCC)   . SVT (supraventricular tachycardia) (HCC) 09/2014  . Thyroid disease     PAST SURGICAL HISTORY: Past Surgical History:  Procedure Laterality Date  . APPENDECTOMY    . BACK SURGERY     lumbar  . BLADDER SURGERY    . BUNIONECTOMY     left and right with hammer toe repair  . CARPAL TUNNEL RELEASE Bilateral 22-Jul-2015  . ELECTROPHYSIOLOGIC STUDY N/A 11/03/2014   Procedure: SVT Ablation;  Surgeon: Marinus Maw,  MD;  Location: [facility];  Service: Cardiovascular;  Laterality: N/A;  . EYE SURGERY     bilateral cataract extraction with IOL  . FOOT SURGERY    . KNEE ARTHROSCOPY  02/09/2011   Procedure: ARTHROSCOPY KNEE;  Surgeon: Jacki Cones;  Location: [facility];  Service: Orthopedics;  Laterality: Left;  Left Knee Arthroscopy with Menisectomy medial, abrasion chondroplasty medial, and synovectomy, supra patella pouch.  Marland Kitchen NASAL SINUS SURGERY    . OOPHORECTOMY    . ROTATOR CUFF REPAIR  1/13,  3/13   x 3  right/  . SALPINGECTOMY    . SHOULDER OPEN ROTATOR CUFF REPAIR Left 06/26/2012   Procedure: LEFT SHOULDER ROTATOR CUFF REPAIR WITH ANCHORS ;  Surgeon: Jacki Cones, MD;  Location: [facility];  Service: Orthopedics;  Laterality: Left;  . TOTAL KNEE ARTHROPLASTY  09/05/2011    Procedure: TOTAL KNEE ARTHROPLASTY;  Surgeon: Jacki Cones, MD;  Location: [facility];  Service: Orthopedics;  Laterality: Left;    FAMILY HISTORY: Family History  Problem Relation Age of Onset  . Diabetes Mother   . Diabetes Son     SOCIAL HISTORY: Social History   Socioeconomic History  . Marital status: Widowed    Spouse name: Not on file  . Number of children: 2  . Years of education: Not on file  . Highest education level: Not on file  Occupational History  . Occupation: Retired  Engineer, production  . Financial resource strain: Not on file  . Food insecurity:    Worry: Not on file    Inability: Not on file  . Transportation needs:    Medical: Not on file    Non-medical: Not on file  Tobacco Use  . Smoking status: Former Smoker    Packs/day: 0.00    Years: 0.00    Pack years: 0.00    Types: Cigarettes    Last attempt to quit: 02/04/1974    Years since quitting: 43.4  . Smokeless tobacco: Never Used  Substance and Sexual Activity  . Alcohol use: Yes    Alcohol/week: 0.6 oz    Types: 1 Glasses of wine per week    Comment: socially  . Drug use: No  . Sexual activity: Not on file  Lifestyle  . Physical activity:    Days per week: Not on file    Minutes per session: Not on file  . Stress: Not on file  Relationships  . Social connections:    Talks on phone: Not on file    Gets together: Not on file    Attends religious service: Not on file    Active member of club or organization: Not on file    Attends meetings of clubs or organizations: Not on file    Relationship status: Not on file  . Intimate partner violence:    Fear of current or ex partner: Not on file    Emotionally abused: Not on file    Physically abused: Not on file    Forced sexual activity: Not on file  Other Topics Concern  . Not on file  Social History Narrative   Lives alone   Caffeine use: 4 drinks coffee per day   Right-handed      PHYSICAL EXAM  There were no vitals filed for this  visit. There is no height or weight on file to calculate BMI.  Generalized: Well developed, in no acute distress   Neurological examination  Mentation: Alert oriented to time, place,  history taking. Follows all commands speech and language fluent Cranial nerve II-XII: Pupils were equal round reactive to light. Extraocular movements were full, visual field were full on confrontational test. Facial sensation and strength were normal. Uvula tongue midline. Head turning and shoulder shrug  were normal and symmetric. Motor: The motor testing reveals 5 over 5 strength of all 4 extremities. Good symmetric motor tone is noted throughout.  Sensory: Sensory testing is intact to soft touch on all 4 extremities. No evidence of extinction is noted.  Coordination: Cerebellar testing reveals good finger-nose-finger and heel-to-shin bilaterally.  Gait and station: Gait is normal. Tandem gait is normal. Romberg is negative. No drift is seen.  Reflexes: Deep tendon reflexes are symmetric and normal bilaterally.   DIAGNOSTIC DATA (LABS, IMAGING, TESTING) - I reviewed patient records, labs, notes, testing and imaging myself where available.  Lab Results  Component Value Date   WBC 8.2 03/01/2017   HGB 13.0 03/01/2017   HCT 39.2 03/01/2017   MCV 93.8 03/01/2017   PLT 250 03/01/2017      Component Value Date/Time   NA 138 03/01/2017 1105   K 3.6 03/01/2017 1105   CL 103 03/01/2017 1105   CO2 25 03/01/2017 1105   GLUCOSE 96 03/01/2017 1105   BUN 12 03/01/2017 1105   CREATININE 0.50 03/01/2017 1105   CALCIUM 8.9 03/01/2017 1105   PROT 6.9 03/01/2017 1105   PROT 6.8 06/27/2016 0935   ALBUMIN 3.4 (L) 03/01/2017 1105   AST 24 03/01/2017 1105   ALT 15 03/01/2017 1105   ALKPHOS 75 03/01/2017 1105   BILITOT 1.1 03/01/2017 1105   GFRNONAA >60 03/01/2017 1105   GFRAA >60 03/01/2017 1105   Lab Results  Component Value Date   CHOL 161 11/03/2014   HDL 47 11/03/2014   LDLCALC 88 11/03/2014   TRIG 130  11/03/2014   CHOLHDL 3.4 11/03/2014   No results found for: HGBA1C Lab Results  Component Value Date   VITAMINB12 1,796 (H) 06/27/2016   Lab Results  Component Value Date   TSH 3.943 10/31/2014      ASSESSMENT AND PLAN 84 y.o. year old female  has a past medical history of Arthritis, Depression, GERD (gastroesophageal reflux disease), Hearing loss, Hyperlipidemia, Idiopathic peripheral neuropathy (07/18/2016), Nocturnal leg cramps (01/22/2017), Stroke (HCC), SVT (supraventricular tachycardia) (HCC) (09/2014), and Thyroid disease. here with ***   I spent 15 minutes with the patient. 50% of this time was spent   Butch Penny, MSN, NP-C 07/24/2017, 1:25 PM [facility] [address], [address] [place], [place] [address] [phone]

## 2017-07-24 NOTE — Telephone Encounter (Signed)
Patient was no show for follow up with NP today.  

## 2017-07-25 ENCOUNTER — Encounter: Payer: Self-pay | Admitting: Adult Health

## 2017-08-08 DIAGNOSIS — F419 Anxiety disorder, unspecified: Secondary | ICD-10-CM | POA: Diagnosis not present

## 2017-08-08 DIAGNOSIS — E782 Mixed hyperlipidemia: Secondary | ICD-10-CM | POA: Diagnosis not present

## 2017-08-08 DIAGNOSIS — R7303 Prediabetes: Secondary | ICD-10-CM | POA: Diagnosis not present

## 2017-08-08 DIAGNOSIS — M810 Age-related osteoporosis without current pathological fracture: Secondary | ICD-10-CM | POA: Diagnosis not present

## 2017-09-19 ENCOUNTER — Encounter: Payer: Self-pay | Admitting: Adult Health

## 2017-09-19 ENCOUNTER — Ambulatory Visit: Payer: Medicare Other | Admitting: Adult Health

## 2017-09-19 VITALS — BP 111/64 | HR 61 | Ht 66.0 in | Wt 140.0 lb

## 2017-09-19 DIAGNOSIS — G609 Hereditary and idiopathic neuropathy, unspecified: Secondary | ICD-10-CM

## 2017-09-19 DIAGNOSIS — G4762 Sleep related leg cramps: Secondary | ICD-10-CM

## 2017-09-19 NOTE — Patient Instructions (Signed)
Your Plan:  Can start baclofen 10 mg at bedtime If your symptoms worsen or you develop new symptoms please let us know.   Thank you [facility]. I hope we have been able to provide you high quality care today.  You may receive a patient satisfaction survey over the next few weeks. We would appreciate your feedback and comments so that we may continue to improve ourselves and the health of our patients.

## 2017-09-19 NOTE — Progress Notes (Signed)
PATIENT: Ann GoldmannJane S Rodriguez DOB: 12/04/1928  REASON FOR VISIT: follow up HISTORY FROM: patient  HISTORY OF PRESENT ILLNESS: Today 09/19/17 : Ann Rodriguez is an 84 year old female with a history of peripheral neuropathy.  She returns today for follow-up.  She states that she has been doing well.  She continues to have nocturnal leg cramps.  She states that she has not been taking baclofen nightly.  She states on occasion she will take it if she wakes up with a cramp.  She denies any pain in the lower extremities.  She reports that he does have numbness that extends to the knee on both legs.  She denies any changes with her gait or balance.  Her last fall was in November.  She returns today for an evaluation.   HISTORY Ann Rodriguez is an 84 year old right-handed white female with a history of a peripheral neuropathy.  The patient does not have much discomfort, but she does have numbness in the feet, she feels as if there is something between her foot and the floor when she is walking barefooted.  She does not notice this as much when she is wearing a shoe.  She does not have pain in her feet at night, she is able to get to sleep fairly well, but she does have frequent, nightly leg cramps or foot cramps.  The patient takes magnesium without much benefit.  She has tried mustard dosing and soap placed in her bed.  The patient does have some minimal balance troubles, she does not use a cane and she has not had any falls.  She returns to this office for an evaluation.    REVIEW OF SYSTEMS: Out of a complete 14 system review of symptoms, the patient complains only of the following symptoms, and all other reviewed systems are negative.  See HPI  ALLERGIES: No Known Allergies  HOME MEDICATIONS: Outpatient Medications Prior to Visit  Medication Sig Dispense Refill  . aspirin EC 81 MG tablet Take 81 mg by mouth daily.    Marland Kitchen. atorvastatin (LIPITOR) 40 MG tablet Take 40 mg by mouth every evening.     . baclofen  (LIORESAL) 10 MG tablet Take 1 tablet (10 mg total) at bedtime by mouth. (Patient taking differently: Take 10 mg by mouth. Daily at bedtime as needed.) 30 each 3  . beta carotene w/minerals (OCUVITE) tablet Take 1 tablet by mouth daily with breakfast.     . BIOTIN PO Take 1 tablet by mouth daily.    . calcium carbonate (OS-CAL) 600 MG TABS Take 1,800 mg by mouth daily with breakfast.     . cholecalciferol (VITAMIN D) 1000 UNITS tablet Take 1,000 Units by mouth daily.     . clonazePAM (KLONOPIN) 1 MG tablet Take 0.5 tablets by mouth daily.  0  . escitalopram (LEXAPRO) 5 MG tablet Take 5 mg by mouth daily.  0  . esomeprazole (NEXIUM) 20 MG capsule Take 20 mg by mouth every morning.     Marland Kitchen. oxyCODONE-acetaminophen (PERCOCET) 10-325 MG tablet Take 1 tablet by mouth every 6 (six) hours as needed for moderate pain.   0  . polyethylene glycol (MIRALAX / GLYCOLAX) packet Take 17 g by mouth every other day.     . vitamin B-12 (CYANOCOBALAMIN) 100 MCG tablet Take 100 mcg by mouth daily.     No facility-administered medications prior to visit.     PAST MEDICAL HISTORY: Past Medical History:  Diagnosis Date  . Arthritis   .  Depression    PT'S HUSBAND AND SON HAVE DIED W/IN PAST YEAR Jul 19, 2011  . GERD (gastroesophageal reflux disease)   . Hearing loss   . Hyperlipidemia   . Idiopathic peripheral neuropathy 07/18/2016  . Nocturnal leg cramps 01/22/2017  . Stroke (HCC)   . SVT (supraventricular tachycardia) (HCC) 09/2014  . Thyroid disease     PAST SURGICAL HISTORY: Past Surgical History:  Procedure Laterality Date  . APPENDECTOMY    . BACK SURGERY     lumbar  . BLADDER SURGERY    . BUNIONECTOMY     left and right with hammer toe repair  . CARPAL TUNNEL RELEASE Bilateral 07-19-15  . ELECTROPHYSIOLOGIC STUDY N/A 11/03/2014   Procedure: SVT Ablation;  Surgeon: Marinus Maw, MD;  Location: [facility] IN[facility] CV [facility];  Service: Cardiovascular;  Laterality: N/A;  . EYE SURGERY     bilateral cataract extraction  with IOL  . FOOT SURGERY    . KNEE ARTHROSCOPY  02/09/2011   Procedure: ARTHROSCOPY KNEE;  Surgeon: Jacki Cones;  Location: [facility];  Service: Orthopedics;  Laterality: Left;  Left Knee Arthroscopy with Menisectomy medial, abrasion chondroplasty medial, and synovectomy, supra patella pouch.  Marland Kitchen NASAL SINUS SURGERY    . OOPHORECTOMY    . ROTATOR CUFF REPAIR  1/13,  3/13   x 3  right/  . SALPINGECTOMY    . SHOULDER OPEN ROTATOR CUFF REPAIR Left 06/26/2012   Procedure: LEFT SHOULDER ROTATOR CUFF REPAIR WITH ANCHORS ;  Surgeon: Jacki Cones, MD;  Location: [facility];  Service: Orthopedics;  Laterality: Left;  . TOTAL KNEE ARTHROPLASTY  09/05/2011   Procedure: TOTAL KNEE ARTHROPLASTY;  Surgeon: Jacki Cones, MD;  Location: [facility];  Service: Orthopedics;  Laterality: Left;    FAMILY HISTORY: Family History  Problem Relation Age of Onset  . Diabetes Mother   . Diabetes Son     SOCIAL HISTORY: Social History   Socioeconomic History  . Marital status: Widowed    Spouse name: Not on file  . Number of children: 2  . Years of education: Not on file  . Highest education level: Not on file  Occupational History  . Occupation: Retired  Engineer, production  . Financial resource strain: Not on file  . Food insecurity:    Worry: Not on file    Inability: Not on file  . Transportation needs:    Medical: Not on file    Non-medical: Not on file  Tobacco Use  . Smoking status: Former Smoker    Packs/day: 0.00    Years: 0.00    Pack years: 0.00    Types: Cigarettes    Last attempt to quit: 02/04/1974    Years since quitting: 43.6  . Smokeless tobacco: Never Used  Substance and Sexual Activity  . Alcohol use: Yes    Alcohol/week: 0.6 oz    Types: 1 Glasses of wine per week    Comment: socially  . Drug use: No  . Sexual activity: Not on file  Lifestyle  . Physical activity:    Days per week: Not on file    Minutes per session: Not on file  . Stress: Not on file  Relationships  .  Social connections:    Talks on phone: Not on file    Gets together: Not on file    Attends religious service: Not on file    Active member of club or organization: Not on file    Attends meetings of clubs or  organizations: Not on file    Relationship status: Not on file  . Intimate partner violence:    Fear of current or ex partner: Not on file    Emotionally abused: Not on file    Physically abused: Not on file    Forced sexual activity: Not on file  Other Topics Concern  . Not on file  Social History Narrative   Lives alone   Caffeine use: 4 drinks coffee per day   Right-handed      PHYSICAL EXAM  Vitals:   09/19/17 0936  BP: 111/64  Pulse: 61  Weight: 140 lb (63.5 kg)  Height: 5\' 6"  (1.676 m)   Body mass index is 22.6 kg/m.  Generalized: Well developed, in no acute distress   Neurological examination  Mentation: Alert oriented to time, place, history taking. Follows all commands speech and language fluent Cranial nerve II-XII: Pupils were equal round reactive to light. Extraocular movements were full, visual field were full on confrontational test. Facial sensation and strength were normal. Uvula tongue midline. Head turning and shoulder shrug  were normal and symmetric. Motor: The motor testing reveals 5 over 5 strength of all 4 extremities. Good symmetric motor tone is noted throughout.  Sensory: Sensory testing is intact to soft touch on all 4 extremities. No evidence of extinction is noted.  Coordination: Cerebellar testing reveals good finger-nose-finger and heel-to-shin bilaterally.  Gait and station: Gait is normal. Tandem gait is unsteady. Romberg is negative. No drift is seen.  Reflexes: Deep tendon reflexes are symmetric and normal bilaterally.   DIAGNOSTIC DATA (LABS, IMAGING, TESTING) - I reviewed patient records, labs, notes, testing and imaging myself where available.  Lab Results  Component Value Date   WBC 8.2 03/01/2017   HGB 13.0 03/01/2017    HCT 39.2 03/01/2017   MCV 93.8 03/01/2017   PLT 250 03/01/2017      Component Value Date/Time   NA 138 03/01/2017 1105   K 3.6 03/01/2017 1105   CL 103 03/01/2017 1105   CO2 25 03/01/2017 1105   GLUCOSE 96 03/01/2017 1105   BUN 12 03/01/2017 1105   CREATININE 0.50 03/01/2017 1105   CALCIUM 8.9 03/01/2017 1105   PROT 6.9 03/01/2017 1105   PROT 6.8 06/27/2016 0935   ALBUMIN 3.4 (L) 03/01/2017 1105   AST 24 03/01/2017 1105   ALT 15 03/01/2017 1105   ALKPHOS 75 03/01/2017 1105   BILITOT 1.1 03/01/2017 1105   GFRNONAA >60 03/01/2017 1105   GFRAA >60 03/01/2017 1105   Lab Results  Component Value Date   CHOL 161 11/03/2014   HDL 47 11/03/2014   LDLCALC 88 11/03/2014   TRIG 130 11/03/2014   CHOLHDL 3.4 11/03/2014   No results found for: HGBA1C Lab Results  Component Value Date   VITAMINB12 1,796 (H) 06/27/2016   Lab Results  Component Value Date   TSH 3.943 10/31/2014      ASSESSMENT AND PLAN 84 y.o. year old female  has a past medical history of Arthritis, Depression, GERD (gastroesophageal reflux disease), Hearing loss, Hyperlipidemia, Idiopathic peripheral neuropathy (07/18/2016), Nocturnal leg cramps (01/22/2017), Stroke (HCC), SVT (supraventricular tachycardia) (HCC) (09/2014), and Thyroid disease. here with :  1.  Peripheral neuropathy 2.  Nocturnal leg cramps  Overall the patient is doing well.  I advised that she can take baclofen 10 mg at bedtime to prevent muscle cramps.  She voiced understanding.  If her symptoms worsen or she develops new symptoms she should let us know.  She  will follow-up in 6 months or sooner if needed.      Butch Penny, MSN, NP-C 09/19/2017, 9:49 AM [facility] [address], [address] [place], [place] [address] [phone]

## 2017-09-29 NOTE — Progress Notes (Signed)
I have read the note, and I agree with the clinical assessment and plan.  Demeka [facility] K Naly Schwanz   

## 2017-12-12 DIAGNOSIS — Z23 Encounter for immunization: Secondary | ICD-10-CM | POA: Diagnosis not present

## 2018-01-28 DIAGNOSIS — K921 Melena: Secondary | ICD-10-CM | POA: Diagnosis not present

## 2018-01-28 DIAGNOSIS — K648 Other hemorrhoids: Secondary | ICD-10-CM | POA: Diagnosis not present

## 2018-03-25 DIAGNOSIS — Z6824 Body mass index (BMI) 24.0-24.9, adult: Secondary | ICD-10-CM | POA: Diagnosis not present

## 2018-03-25 DIAGNOSIS — M545 Low back pain: Secondary | ICD-10-CM | POA: Diagnosis not present

## 2018-03-25 DIAGNOSIS — M4150 Other secondary scoliosis, site unspecified: Secondary | ICD-10-CM | POA: Diagnosis not present

## 2018-04-10 ENCOUNTER — Other Ambulatory Visit: Payer: Self-pay | Admitting: Family Medicine

## 2018-04-10 DIAGNOSIS — Z8719 Personal history of other diseases of the digestive system: Secondary | ICD-10-CM | POA: Diagnosis not present

## 2018-04-10 DIAGNOSIS — R1011 Right upper quadrant pain: Secondary | ICD-10-CM

## 2018-04-10 DIAGNOSIS — K828 Other specified diseases of gallbladder: Secondary | ICD-10-CM

## 2018-04-10 DIAGNOSIS — R0789 Other chest pain: Secondary | ICD-10-CM | POA: Diagnosis not present

## 2018-04-10 DIAGNOSIS — K8020 Calculus of gallbladder without cholecystitis without obstruction: Secondary | ICD-10-CM

## 2018-04-11 ENCOUNTER — Ambulatory Visit
Admission: RE | Admit: 2018-04-11 | Discharge: 2018-04-11 | Disposition: A | Discharge status: Home / Self Care | Payer: Medicare Other | Source: Ambulatory Visit | Attending: Family Medicine | Admitting: Family Medicine

## 2018-04-11 DIAGNOSIS — K828 Other specified diseases of gallbladder: Secondary | ICD-10-CM

## 2018-04-11 DIAGNOSIS — R1011 Right upper quadrant pain: Secondary | ICD-10-CM | POA: Diagnosis not present

## 2018-04-11 DIAGNOSIS — K8020 Calculus of gallbladder without cholecystitis without obstruction: Secondary | ICD-10-CM

## 2018-04-30 DIAGNOSIS — E042 Nontoxic multinodular goiter: Secondary | ICD-10-CM | POA: Diagnosis not present

## 2018-04-30 DIAGNOSIS — K219 Gastro-esophageal reflux disease without esophagitis: Secondary | ICD-10-CM | POA: Diagnosis not present

## 2018-04-30 DIAGNOSIS — R1011 Right upper quadrant pain: Secondary | ICD-10-CM | POA: Diagnosis not present

## 2018-04-30 DIAGNOSIS — I4891 Unspecified atrial fibrillation: Secondary | ICD-10-CM | POA: Diagnosis not present

## 2018-05-15 DIAGNOSIS — M810 Age-related osteoporosis without current pathological fracture: Secondary | ICD-10-CM | POA: Diagnosis not present

## 2018-05-15 DIAGNOSIS — E782 Mixed hyperlipidemia: Secondary | ICD-10-CM | POA: Diagnosis not present

## 2018-05-15 DIAGNOSIS — F419 Anxiety disorder, unspecified: Secondary | ICD-10-CM | POA: Diagnosis not present

## 2018-05-15 DIAGNOSIS — R7303 Prediabetes: Secondary | ICD-10-CM | POA: Diagnosis not present

## 2018-06-30 ENCOUNTER — Telehealth: Payer: Self-pay

## 2018-06-30 NOTE — Telephone Encounter (Signed)
YOUR CARDIOLOGY TEAM HAS ARRANGED FOR AN E-VISIT FOR YOUR APPOINTMENT - PLEASE REVIEW IMPORTANT INFORMATION BELOW SEVERAL DAYS PRIOR TO YOUR APPOINTMENT  Due to the recent COVID-19 pandemic, we are transitioning in-person office visits to tele-medicine visits in an effort to decrease unnecessary exposure to our patients, their families, and staff. These visits are billed to your insurance just like a normal visit is. We also encourage you to sign up for MyChart if you have not already done so. You will need a smartphone if possible. For patients that do not have this, we can still complete the visit using a regular telephone but do prefer a smartphone to enable video when possible. You may have a family member that lives with you that can help. If possible, we also ask that you have a blood pressure cuff and scale at home to measure your blood pressure, heart rate and weight prior to your scheduled appointment. Patients with clinical needs that need an in-person evaluation and testing will still be able to come to the office if absolutely necessary. If you have any questions, feel free to call our office.     YOUR PROVIDER WILL BE USING THE FOLLOWING PLATFORM TO COMPLETE YOUR VISIT: Yes  . IF USING WEBEX - How to Download the WebEx App to Your SmartPhone  - If Apple device, go to App Store and type in WebEx in the search bar. Download [employer] Webex Meetings, the blue/green circle. If Android, go to Google Play Store and type in WebEx in the search bar. The app is free but as with any other app download, your phone may require you to verify saved payment information or Apple/Android password.  - You do NOT have to create an account. - On the day of the visit, our staff will walk you through joining the meeting with the meeting number/password.  . IF USING MYCHART - How to Download the MyChart App to Your SmartPhone   - If Apple, go to App Store and type in MyChart in the search bar and download the  app. If Android, ask patient to go to Google Play Store and type in MyChart in the search bar and download the app. The app is free but as with any other app downloads, your phone may require you to verify saved payment information or Apple/Android password.  - You will need to then log into the app with your MyChart username and password, and select [facility] as your healthcare provider to link the account. When it is time for your visit, go to the MyChart app, find appointments, and click Begin Video Visit. Be sure to Select Allow for your device to access the Microphone and Camera for your visit. You will then be connected, and your provider will be with you shortly.  **If you have any issues connecting or need assistance, please contact MyChart service desk [phone] ([phone])**  **If using a computer, in order to ensure the best quality for your visit, you will need to use either of the following Internet Browsers: Microsoft Edge, or Google Chrome**  . IF USING DOXIMITY or [email or web address].ME - The staff will give you instructions on receiving your link to join the meeting the day of your visit.      2-3 DAYS BEFORE YOUR APPOINTMENT  You will receive a telephone call from one of our [facility] team members - your caller ID may say "Unknown caller." If this is a video visit, we will walk you through how to set   up your device to be able to complete the visit. We will remind you check your blood pressure, heart rate and weight prior to your scheduled appointment. If you have an Apple Watch or Kardia, please upload any pertinent ECG strips the day before or morning of your appointment to MyChart. Our staff will also make sure you have reviewed the consent and agree to move forward with your scheduled tele-health visit.     THE DAY OF YOUR APPOINTMENT  Approximately 15 minutes prior to your scheduled appointment, you will receive a telephone call from one of HeartCare team - your caller ID may say  "Unknown caller."  Our staff will confirm medications, vital signs for the day and any symptoms you may be experiencing. Please have this information available prior to the time of visit start. It may also be helpful for you to have a pad of paper and pen handy for any instructions given during your visit. They will also walk you through joining the smartphone meeting if this is a video visit.    CONSENT FOR TELE-HEALTH VISIT - PLEASE REVIEW  I hereby voluntarily request, consent and authorize [facility] and its employed or contracted physicians, physician assistants, nurse practitioners or other licensed health care professionals (the Practitioner), to provide me with telemedicine health care services (the "Services") as deemed necessary by the treating Practitioner. I acknowledge and consent to receive the Services by the Practitioner via telemedicine. I understand that the telemedicine visit will involve communicating with the Practitioner through live audiovisual communication technology and the disclosure of certain medical information by electronic transmission. I acknowledge that I have been given the opportunity to request an in-person assessment or other available alternative prior to the telemedicine visit and am voluntarily participating in the telemedicine visit.  I understand that I have the right to withhold or withdraw my consent to the use of telemedicine in the course of my care at any time, without affecting my right to future care or treatment, and that the Practitioner or I may terminate the telemedicine visit at any time. I understand that I have the right to inspect all information obtained and/or recorded in the course of the telemedicine visit and may receive copies of available information for a reasonable fee.  I understand that some of the potential risks of receiving the Services via telemedicine include:  . Delay or interruption in medical evaluation due to technological  equipment failure or disruption; . Information transmitted may not be sufficient (e.g. poor resolution of images) to allow for appropriate medical decision making by the Practitioner; and/or  . In rare instances, security protocols could fail, causing a breach of personal health information.  Furthermore, I acknowledge that it is my responsibility to provide information about my medical history, conditions and care that is complete and accurate to the best of my ability. I acknowledge that Practitioner's advice, recommendations, and/or decision may be based on factors not within their control, such as incomplete or inaccurate data provided by me or distortions of diagnostic images or specimens that may result from electronic transmissions. I understand that the practice of medicine is not an exact science and that Practitioner makes no warranties or guarantees regarding treatment outcomes. I acknowledge that I will receive a copy of this consent concurrently upon execution via email to the email address I last provided but may also request a printed copy by calling the office of [facility].    I understand that my insurance will be billed for   this visit.   I have read or had this consent read to me. . I understand the contents of this consent, which adequately explains the benefits and risks of the Services being provided via telemedicine.  . I have been provided ample opportunity to ask questions regarding this consent and the Services and have had my questions answered to my satisfaction. . I give my informed consent for the services to be provided through the use of telemedicine in my medical care  By participating in this telemedicine visit I agree to the above.  

## 2018-06-30 NOTE — Progress Notes (Signed)
Virtual Visit via Video Note   This visit type was conducted due to national recommendations for restrictions regarding the COVID-19 Pandemic (e.g. social distancing) in an effort to limit this patient's exposure and mitigate transmission in our community.  Due to her co-morbid illnesses, this patient is at least at moderate risk for complications without adequate follow up.  This format is felt to be most appropriate for this patient at this time.  All issues noted in this document were discussed and addressed.  A limited physical exam was performed with this format.  Please refer to the patient's chart for her consent to telehealth for [facility].   Evaluation Performed:  Follow-up visit  Date:  07/01/2018   ID:  Ann Rodriguez, DOB 05-28-1928, MRN [id number]  Patient Location: Home Provider Location: Office  PCP:  Tally Joe, MD  Cardiologist:  Lesleigh Noe, MD  Electrophysiologist:  None   Chief Complaint:  PSVT  History of Present Illness:    Ann Rodriguez is a 85 y.o. female with h/o of SVT s/p ablation, possible PAF(remote) without documentation on ECG, hypertension, and disastolic dysfunction. After SVT ablation, flecainide and anticoagulation was discontinued.   Doing okay. Occasional palpitation but no other complaint.  The patient does not have symptoms concerning for COVID-19 infection (fever, chills, cough, or new shortness of breath).    Past Medical History:  Diagnosis Date  . Arthritis   . Depression    PT'S HUSBAND AND SON HAVE DIED W/IN PAST YEAR 07-25-2011  . GERD (gastroesophageal reflux disease)   . Hearing loss   . Hyperlipidemia   . Idiopathic peripheral neuropathy 07/18/2016  . Nocturnal leg cramps 01/22/2017  . Stroke (HCC)   . SVT (supraventricular tachycardia) (HCC) 09/2014  . Thyroid disease    Past Surgical History:  Procedure Laterality Date  . APPENDECTOMY    . BACK SURGERY     lumbar  . BLADDER SURGERY    . BUNIONECTOMY     left and  right with hammer toe repair  . CARPAL TUNNEL RELEASE Bilateral Jul 25, 2015  . ELECTROPHYSIOLOGIC STUDY N/A 11/03/2014   Procedure: SVT Ablation;  Surgeon: Marinus Maw, MD;  [facility] INVASIVE CV LAB;  Service: Cardiovascular;  Laterality: N/A;  . EYE SURGERY     bilateral cataract extraction with IOL  . FOOT SURGERY    . KNEE ARTHROSCOPY  02/09/2011   Procedure: ARTHROSCOPY KNEE;  Surgeon: Jacki Cones;  Location: [facility] ORS;  Service: Orthopedics;  Laterality: Left;  Left Knee Arthroscopy with Menisectomy medial, abrasion chondroplasty medial, and synovectomy, supra patella pouch.  Marland Kitchen NASAL SINUS SURGERY    . OOPHORECTOMY    . ROTATOR CUFF REPAIR  1/13,  3/13   x 3  right/  . SALPINGECTOMY    . SHOULDER OPEN ROTATOR CUFF REPAIR Left 07-24-12   Procedure: LEFT SHOULDER ROTATOR CUFF REPAIR WITH ANCHORS ;  Surgeon: Jacki Cones, MD;  Location: [facility] OR[facility];  Service: Orthopedics;  Laterality: Left;  . TOTAL KNEE ARTHROPLASTY  09/05/2011   Procedure: TOTAL KNEE ARTHROPLASTY;  Surgeon: Jacki Cones, MD;  Location: [facility];  Service: Orthopedics;  Laterality: Left;     Current Meds  Medication Sig  . aspirin EC 81 MG tablet Take 81 mg by mouth daily.  Marland Kitchen atorvastatin (LIPITOR) 40 MG tablet Take 40 mg by mouth every evening.   . baclofen (LIORESAL) 10 MG tablet Take 1 tablet (10 mg total) at bedtime by mouth.  Marland Kitchen  beta carotene w/minerals (OCUVITE) tablet Take 1 tablet by mouth daily with breakfast.   . BIOTIN PO Take 1 tablet by mouth daily.  . calcium carbonate (OS-CAL) 600 MG TABS Take 1,800 mg by mouth daily with breakfast.   . cholecalciferol (VITAMIN D) 1000 UNITS tablet Take 1,000 Units by mouth daily.   . clonazePAM (KLONOPIN) 1 MG tablet Take 0.5 tablets by mouth daily.  Marland Kitchen. escitalopram (LEXAPRO) 5 MG tablet Take 5 mg by mouth daily.  Marland Kitchen. esomeprazole (NEXIUM) 20 MG capsule Take 20 mg by mouth every morning.   Marland Kitchen. oxyCODONE-acetaminophen (PERCOCET) 10-325 MG tablet Take 1 tablet by mouth  every 6 (six) hours as needed for moderate pain.   . polyethylene glycol (MIRALAX / GLYCOLAX) packet Take 17 g by mouth every other day.   . vitamin B-12 (CYANOCOBALAMIN) 100 MCG tablet Take 100 mcg by mouth daily.     Allergies:   Patient has no known allergies.   Social History   Tobacco Use  . Smoking status: Former Smoker    Packs/day: 0.00    Years: 0.00    Pack years: 0.00    Types: Cigarettes    Last attempt to quit: 02/04/1974    Years since quitting: 44.4  . Smokeless tobacco: Never Used  Substance Use Topics  . Alcohol use: Yes    Alcohol/week: 1.0 standard drinks    Types: 1 Glasses of wine per week    Comment: socially  . Drug use: No     Family Hx: The patient's family history includes Diabetes in her mother and son.  ROS:   Please see the history of present illness.    She occasionally feels short of breath while speaking on the phone to others.  She is not having orthopnea or any significant change in overall exertional tolerance. All other systems reviewed and are negative.   Prior CV studies:   The following studies were reviewed today:  No imaging or functional data.  Labs/Other Tests and Data Reviewed:    EKG:  No ECG reviewed.  Recent Labs: No results found for requested labs within last 8760 hours.   Recent Lipid Panel Lab Results  Component Value Date/Time   CHOL 161 11/03/2014 04:50 AM   TRIG 130 11/03/2014 04:50 AM   HDL 47 11/03/2014 04:50 AM   CHOLHDL 3.4 11/03/2014 04:50 AM   LDLCALC 88 11/03/2014 04:50 AM    Wt Readings from Last 3 Encounters:  07/01/18 140 lb (63.5 kg)  09/19/17 140 lb (63.5 kg)  04/15/17 141 lb 12.8 oz (64.3 kg)     Objective:    Vital Signs:  Ht 5\' 6"  (1.676 m)   Wt 140 lb (63.5 kg)   BMI 22.60 kg/m    VITAL SIGNS:  reviewed  No dyspnea during interview  ASSESSMENT & PLAN:    1. Chronic diastolic heart failure (HCC)   2. Paroxysmal atrial fibrillation (HCC)   3. Chronic anticoagulation   4.  Educated About Covid-19 Virus Infection    PLAN:   1. She is encouraged to observe for evidence of swelling and to report unusual shortness of breath or orthopnea.  No medication changes are made. 2. Doing well off anticoagulation therapy and flecainide.  Probably never had atrial fibrillation.Continue aspirin 81 mg/day. 3. Continue aspirin.  Remove anticoagulation from problem list 4. She is acquainted with social distancing.  COVID-19 Education: The signs and symptoms of COVID-19 were discussed with the patient and how to seek care for testing (  follow up with PCP or arrange E-visit).  The importance of social distancing was discussed today.  Time:   Today, I have spent 15 minutes with the patient with telehealth technology discussing the above problems.     Medication Adjustments/Labs and Tests Ordered: Current medicines are reviewed at length with the patient today.  Concerns regarding medicines are outlined above.   Tests Ordered: No orders of the defined types were placed in this encounter.   Medication Changes: No orders of the defined types were placed in this encounter.   Disposition:  Follow up in 9 month(s)  Signed, Lesleigh Noe, MD  07/01/2018 2:19 PM    [facility]

## 2018-07-01 ENCOUNTER — Other Ambulatory Visit: Payer: Self-pay

## 2018-07-01 ENCOUNTER — Telehealth (INDEPENDENT_AMBULATORY_CARE_PROVIDER_SITE_OTHER): Payer: Medicare Other | Admitting: Interventional Cardiology

## 2018-07-01 ENCOUNTER — Encounter: Payer: Self-pay | Admitting: Interventional Cardiology

## 2018-07-01 VITALS — Ht 66.0 in | Wt 140.0 lb

## 2018-07-01 DIAGNOSIS — Z7901 Long term (current) use of anticoagulants: Secondary | ICD-10-CM

## 2018-07-01 DIAGNOSIS — I480 Paroxysmal atrial fibrillation: Secondary | ICD-10-CM

## 2018-07-01 DIAGNOSIS — I5032 Chronic diastolic (congestive) heart failure: Secondary | ICD-10-CM

## 2018-07-01 DIAGNOSIS — Z7189 Other specified counseling: Secondary | ICD-10-CM

## 2018-07-01 NOTE — Patient Instructions (Signed)
Medication Instructions:  Your physician recommends that you continue on your current medications as directed. Please refer to the Current Medication list given to you today.  If you need a refill on your cardiac medications before your next appointment, please call your pharmacy.   Lab work: None If you have labs (blood work) drawn today and your tests are completely normal, you will receive your results only by: . MyChart Message (if you have MyChart) OR . A paper copy in the mail If you have any lab test that is abnormal or we need to change your treatment, we will call you to review the results.  Testing/Procedures: None  Follow-Up: At [facility], you and your health needs are our priority.  As part of our continuing mission to provide you with exceptional heart care, we have created designated Provider Care Teams.  These Care Teams include your primary Cardiologist (physician) and Advanced Practice Providers (APPs -  Physician Assistants and Nurse Practitioners) who all work together to provide you with the care you need, when you need it. You will need a follow up appointment in 9-12 months.  Please call our office 2 months in advance to schedule this appointment.  You may see Henry W Smith III, MD or one of the following Advanced Practice Providers on your designated Care Team:   Lori Gerhardt, NP Laura Ingold, NP . Jill McDaniel, NP  Any Other Special Instructions Will Be Listed Below (If Applicable).    

## 2018-09-05 DIAGNOSIS — E782 Mixed hyperlipidemia: Secondary | ICD-10-CM | POA: Diagnosis not present

## 2018-09-05 DIAGNOSIS — F419 Anxiety disorder, unspecified: Secondary | ICD-10-CM | POA: Diagnosis not present

## 2018-09-05 DIAGNOSIS — M810 Age-related osteoporosis without current pathological fracture: Secondary | ICD-10-CM | POA: Diagnosis not present

## 2018-09-05 DIAGNOSIS — R7303 Prediabetes: Secondary | ICD-10-CM | POA: Diagnosis not present

## 2018-09-15 DIAGNOSIS — F419 Anxiety disorder, unspecified: Secondary | ICD-10-CM | POA: Diagnosis not present

## 2018-09-22 ENCOUNTER — Encounter: Payer: Self-pay | Admitting: Adult Health

## 2018-09-22 ENCOUNTER — Other Ambulatory Visit: Payer: Self-pay

## 2018-09-22 ENCOUNTER — Ambulatory Visit (INDEPENDENT_AMBULATORY_CARE_PROVIDER_SITE_OTHER): Payer: Medicare Other | Admitting: Adult Health

## 2018-09-22 VITALS — BP 112/70 | HR 63 | Temp 97.8°F | Ht 66.0 in | Wt 138.0 lb

## 2018-09-22 DIAGNOSIS — G609 Hereditary and idiopathic neuropathy, unspecified: Secondary | ICD-10-CM

## 2018-09-22 DIAGNOSIS — G4762 Sleep related leg cramps: Secondary | ICD-10-CM | POA: Diagnosis not present

## 2018-09-22 NOTE — Progress Notes (Signed)
PATIENT: Ann GoldmannJane S Rodriguez DOB: 04/24/1928  REASON FOR VISIT: follow up HISTORY FROM: patient  HISTORY OF PRESENT ILLNESS: Today 09/22/18:  Ann Rodriguez is an 85 year old female with a history of peripheral neuropathy.  She returns today for follow-up.  She denies any discomfort in the lower extremities.  Reports that she continues to have numbness that extends up the leg to the knees bilaterally.  She reports that she has noticed some changes with her balance but has not had any recent falls.  She does not use a cane or walker when ambulating.  She reports that she has noticed some numbness in the left hand.  Reports that she has previously had carpal tunnel surgery on both hands.  She states that she is not having any numbness in the right hand.    She states that she has not been taking baclofen regularly.  She still has nocturnal leg cramps.  She states that she typically just gets up and takes mustard.  She returns today for follow-up.  HISTORY 09/19/17 : Ann Rodriguez is an 85 year old female with a history of peripheral neuropathy.  She returns today for follow-up.  She states that she has been doing well.  She continues to have nocturnal leg cramps.  She states that she has not been taking baclofen nightly.  She states on occasion she will take it if she wakes up with a cramp.  She denies any pain in the lower extremities.  She reports that he does have numbness that extends to the knee on both legs.  She denies any changes with her gait or balance.  Her last fall was in November.  She returns today for an evaluation.   REVIEW OF SYSTEMS: Out of a complete 14 system review of symptoms, the patient complains only of the following symptoms, and all other reviewed systems are negative.  See HPI  ALLERGIES: No Known Allergies  HOME MEDICATIONS: Outpatient Medications Prior to Visit  Medication Sig Dispense Refill   aspirin EC 81 MG tablet Take 81 mg by mouth daily.     atorvastatin  (LIPITOR) 40 MG tablet Take 40 mg by mouth every evening.      baclofen (LIORESAL) 10 MG tablet Take 1 tablet (10 mg total) at bedtime by mouth. 30 each 3   beta carotene w/minerals (OCUVITE) tablet Take 1 tablet by mouth daily with breakfast.      BIOTIN PO Take 1 tablet by mouth daily.     calcium carbonate (OS-CAL) 600 MG TABS Take 1,800 mg by mouth daily with breakfast.      cholecalciferol (VITAMIN D) 1000 UNITS tablet Take 1,000 Units by mouth daily.      clonazePAM (KLONOPIN) 1 MG tablet Take 0.5 tablets by mouth daily.  0   escitalopram (LEXAPRO) 5 MG tablet Take 5 mg by mouth daily.  0   esomeprazole (NEXIUM) 20 MG capsule Take 20 mg by mouth every morning.      oxyCODONE-acetaminophen (PERCOCET) 10-325 MG tablet Take 1 tablet by mouth every 6 (six) hours as needed for moderate pain.   0   polyethylene glycol (MIRALAX / GLYCOLAX) packet Take 17 g by mouth every other day.      vitamin B-12 (CYANOCOBALAMIN) 100 MCG tablet Take 100 mcg by mouth daily.     No facility-administered medications prior to visit.     PAST MEDICAL HISTORY: Past Medical History:  Diagnosis Date   Arthritis    Depression    PT'S HUSBAND  AND SON HAVE DIED W/IN PAST YEAR 08-Jul-2011   GERD (gastroesophageal reflux disease)    Hearing loss    Hyperlipidemia    Idiopathic peripheral neuropathy 07/18/2016   Nocturnal leg cramps 01/22/2017   Stroke [facility])    SVT (supraventricular tachycardia) ([place]) 09/2014   Thyroid disease     PAST SURGICAL HISTORY: Past Surgical History:  Procedure Laterality Date   APPENDECTOMY     BACK SURGERY     lumbar   BLADDER SURGERY     BUNIONECTOMY     left and right with hammer toe repair   CARPAL TUNNEL RELEASE Bilateral Jul 08, 2015   ELECTROPHYSIOLOGIC STUDY N/A 11/03/2014   Procedure: SVT Ablation;  Surgeon: Evans Lance, MD;  Location: [facility];  Service: Cardiovascular;  Laterality: N/A;   EYE SURGERY     bilateral cataract extraction with  IOL   FOOT SURGERY     KNEE ARTHROSCOPY  02/09/2011   Procedure: ARTHROSCOPY KNEE;  Surgeon: Tobi Bastos;  Location: [facility];  Service: Orthopedics;  Laterality: Left;  Left Knee Arthroscopy with Menisectomy medial, abrasion chondroplasty medial, and synovectomy, supra patella pouch.   NASAL SINUS SURGERY     OOPHORECTOMY     ROTATOR CUFF REPAIR  1/13,  3/13   x 3  right/   SALPINGECTOMY     SHOULDER OPEN ROTATOR CUFF REPAIR Left 06/26/2012   Procedure: LEFT SHOULDER ROTATOR CUFF REPAIR WITH ANCHORS ;  Surgeon: Tobi Bastos, MD;  Location: [facility];  Service: Orthopedics;  Laterality: Left;   TOTAL KNEE ARTHROPLASTY  09/05/2011   Procedure: TOTAL KNEE ARTHROPLASTY;  Surgeon: Tobi Bastos, MD;  Location: [facility];  Service: Orthopedics;  Laterality: Left;    FAMILY HISTORY: Family History  Problem Relation Age of Onset   Diabetes Mother    Diabetes Son     SOCIAL HISTORY: Social History   Socioeconomic History   Marital status: Widowed    Spouse name: Not on file   Number of children: 2   Years of education: Not on file   Highest education level: Not on file  Occupational History   Occupation: Retired  Scientist, product/process development strain: Not on file   Food insecurity    Worry: Not on file    Inability: Not on Lexicographer needs    Medical: Not on file    Non-medical: Not on file  Tobacco Use   Smoking status: Former Smoker    Packs/day: 0.00    Years: 0.00    Pack years: 0.00    Types: Cigarettes    Quit date: 02/04/1974    Years since quitting: 44.6   Smokeless tobacco: Never Used  Substance and Sexual Activity   Alcohol use: Yes    Alcohol/week: 1.0 standard drinks    Types: 1 Glasses of wine per week    Comment: socially   Drug use: No   Sexual activity: Not on file  Lifestyle   Physical activity    Days per week: Not on file    Minutes per session: Not on file   Stress: Not on file  Relationships   Social  connections    Talks on phone: Not on file    Gets together: Not on file    Attends religious service: Not on file    Active member of club or organization: Not on file    Attends meetings of clubs or organizations: Not on file  Relationship status: Not on file   Intimate partner violence    Fear of current or ex partner: Not on file    Emotionally abused: Not on file    Physically abused: Not on file    Forced sexual activity: Not on file  Other Topics Concern   Not on file  Social History Narrative   Lives alone   Caffeine use: 4 drinks coffee per day   Right-handed      PHYSICAL EXAM  Vitals:   09/22/18 1116  BP: 112/70  Pulse: 63  Temp: 97.8 F (36.6 C)  Weight: 138 lb (62.6 kg)  Height: 5\' 6"  (1.676 m)   Body mass index is 22.27 kg/m.  Generalized: Well developed, in no acute distress   Neurological examination  Mentation: Alert oriented to time, place, history taking. Follows all commands speech and language fluent Cranial nerve II-XII: Extraocular movements were full, visual field were full on confrontational test. Facial sensation and strength were normal. Uvula tongue midline. Head turning and shoulder shrug  were normal and symmetric. Motor: The motor testing reveals 5 over 5 strength of all 4 extremities. Good symmetric motor tone is noted throughout.  Sensory: Sensory, vibration and pinprick testing is intact to soft touch on all 4 extremities. No evidence of extinction is noted.  Coordination: Cerebellar testing reveals good finger-nose-finger and heel-to-shin bilaterally.  Gait and station: Gait is normal. Tandem gait not attempted.  Romberg is negative. No drift is seen.  Reflexes: Deep tendon reflexes are symmetric but depressed throughout  DIAGNOSTIC DATA (LABS, IMAGING, TESTING) - I reviewed patient records, labs, notes, testing and imaging myself where available.  Lab Results  Component Value Date   WBC 8.2 03/01/2017   HGB 13.0 03/01/2017     HCT 39.2 03/01/2017   MCV 93.8 03/01/2017   PLT 250 03/01/2017      Component Value Date/Time   NA 138 03/01/2017 1105   K 3.6 03/01/2017 1105   CL 103 03/01/2017 1105   CO2 25 03/01/2017 1105   GLUCOSE 96 03/01/2017 1105   BUN 12 03/01/2017 1105   CREATININE 0.50 03/01/2017 1105   CALCIUM 8.9 03/01/2017 1105   PROT 6.9 03/01/2017 1105   PROT 6.8 06/27/2016 0935   ALBUMIN 3.4 (L) 03/01/2017 1105   AST 24 03/01/2017 1105   ALT 15 03/01/2017 1105   ALKPHOS 75 03/01/2017 1105   BILITOT 1.1 03/01/2017 1105   GFRNONAA >60 03/01/2017 1105   GFRAA >60 03/01/2017 1105   Lab Results  Component Value Date   CHOL 161 11/03/2014   HDL 47 11/03/2014   LDLCALC 88 11/03/2014   TRIG 130 11/03/2014   CHOLHDL 3.4 11/03/2014   No results found for: HGBA1C Lab Results  Component Value Date   VITAMINB12 1,796 (H) 06/27/2016   Lab Results  Component Value Date   TSH 3.943 10/31/2014      ASSESSMENT AND PLAN 85 y.o. year old female  has a past medical history of Arthritis, Depression, GERD (gastroesophageal reflux disease), Hearing loss, Hyperlipidemia, Idiopathic peripheral neuropathy (07/18/2016), Nocturnal leg cramps (01/22/2017), Stroke (HCC), SVT (supraventricular tachycardia) (HCC) (09/2014), and Thyroid disease. here with:  1.  Peripheral neuropathy 2.  Nocturnal leg cramps  Fortunately the patient has not had any pain associated with her neuropathy.  We will continue to monitor symptoms.  We discussed nerve conduction study of the upper extremity due to numbness however the patient deferred at this time.  I again recommended taking baclofen 10 mg at  bedtime to prevent leg cramps.  I have advised that if her symptoms worsen or she develops new symptoms she should let us know.  She will follow-up in 1 year or sooner if needed.   Butch PennyMegan Lucian Baswell, MSN, NP-C 09/22/2018, 11:11 AM [facility] [address], [address] [place], [place] [address] [phone]

## 2018-09-22 NOTE — Patient Instructions (Signed)
Your Plan:  Try taking baclofen at bedtime to prevent muscle cramps If balance worsens let us know and we will consider PT If your symptoms worsen or you develop new symptoms please let us know.   Thank you for coming to see [place] at [facility]. I hope we have been able to provide you high quality care today.  You may receive a patient satisfaction survey over the next few weeks. We would appreciate your feedback and comments so that we may continue to improve ourselves and the health of our patients.

## 2018-10-02 DIAGNOSIS — Z96652 Presence of left artificial knee joint: Secondary | ICD-10-CM | POA: Diagnosis not present

## 2018-10-02 DIAGNOSIS — Z780 Asymptomatic menopausal state: Secondary | ICD-10-CM | POA: Diagnosis not present

## 2018-10-02 DIAGNOSIS — M810 Age-related osteoporosis without current pathological fracture: Secondary | ICD-10-CM | POA: Diagnosis not present

## 2018-10-02 DIAGNOSIS — M40204 Unspecified kyphosis, thoracic region: Secondary | ICD-10-CM | POA: Diagnosis not present

## 2018-10-21 DIAGNOSIS — H5201 Hypermetropia, right eye: Secondary | ICD-10-CM | POA: Diagnosis not present

## 2018-10-21 DIAGNOSIS — H353121 Nonexudative age-related macular degeneration, left eye, early dry stage: Secondary | ICD-10-CM | POA: Diagnosis not present

## 2018-10-21 DIAGNOSIS — H5212 Myopia, left eye: Secondary | ICD-10-CM | POA: Diagnosis not present

## 2018-10-21 DIAGNOSIS — H524 Presbyopia: Secondary | ICD-10-CM | POA: Diagnosis not present

## 2018-10-21 DIAGNOSIS — H353111 Nonexudative age-related macular degeneration, right eye, early dry stage: Secondary | ICD-10-CM | POA: Diagnosis not present

## 2018-11-11 DIAGNOSIS — Z0120 Encounter for dental examination and cleaning without abnormal findings: Secondary | ICD-10-CM | POA: Diagnosis not present

## 2018-11-20 DIAGNOSIS — R7303 Prediabetes: Secondary | ICD-10-CM | POA: Diagnosis not present

## 2018-11-20 DIAGNOSIS — L219 Seborrheic dermatitis, unspecified: Secondary | ICD-10-CM | POA: Diagnosis not present

## 2018-11-20 DIAGNOSIS — E782 Mixed hyperlipidemia: Secondary | ICD-10-CM | POA: Diagnosis not present

## 2018-11-20 DIAGNOSIS — F419 Anxiety disorder, unspecified: Secondary | ICD-10-CM | POA: Diagnosis not present

## 2018-11-21 DIAGNOSIS — M25511 Pain in right shoulder: Secondary | ICD-10-CM | POA: Diagnosis not present

## 2019-02-26 DIAGNOSIS — M542 Cervicalgia: Secondary | ICD-10-CM | POA: Diagnosis not present

## 2019-03-05 DIAGNOSIS — M545 Low back pain: Secondary | ICD-10-CM | POA: Diagnosis not present

## 2019-03-05 DIAGNOSIS — S39012A Strain of muscle, fascia and tendon of lower back, initial encounter: Secondary | ICD-10-CM | POA: Diagnosis not present

## 2019-03-16 DIAGNOSIS — M545 Low back pain: Secondary | ICD-10-CM | POA: Diagnosis not present

## 2019-03-26 DIAGNOSIS — R7303 Prediabetes: Secondary | ICD-10-CM | POA: Diagnosis not present

## 2019-03-26 DIAGNOSIS — F419 Anxiety disorder, unspecified: Secondary | ICD-10-CM | POA: Diagnosis not present

## 2019-03-26 DIAGNOSIS — M810 Age-related osteoporosis without current pathological fracture: Secondary | ICD-10-CM | POA: Diagnosis not present

## 2019-03-26 DIAGNOSIS — E782 Mixed hyperlipidemia: Secondary | ICD-10-CM | POA: Diagnosis not present

## 2019-04-14 DIAGNOSIS — R7303 Prediabetes: Secondary | ICD-10-CM | POA: Diagnosis not present

## 2019-04-14 DIAGNOSIS — E782 Mixed hyperlipidemia: Secondary | ICD-10-CM | POA: Diagnosis not present

## 2019-04-14 DIAGNOSIS — R030 Elevated blood-pressure reading, without diagnosis of hypertension: Secondary | ICD-10-CM | POA: Diagnosis not present

## 2019-04-14 DIAGNOSIS — F419 Anxiety disorder, unspecified: Secondary | ICD-10-CM | POA: Diagnosis not present

## 2019-04-15 DIAGNOSIS — I4891 Unspecified atrial fibrillation: Secondary | ICD-10-CM | POA: Diagnosis not present

## 2019-04-15 DIAGNOSIS — E782 Mixed hyperlipidemia: Secondary | ICD-10-CM | POA: Diagnosis not present

## 2019-04-15 DIAGNOSIS — M810 Age-related osteoporosis without current pathological fracture: Secondary | ICD-10-CM | POA: Diagnosis not present

## 2019-05-04 NOTE — Progress Notes (Signed)
Cardiology Office Note:    Date:  05/05/2019   ID:  Ann Rodriguez, DOB 1929/02/11, MRN [id number]  PCP:  Tally Joe, MD  Cardiologist:  Lesleigh Noe, MD   Referring MD: Tally Joe, MD   Chief Complaint  Patient presents with  . Atrial Fibrillation    History of Present Illness:    Ann Rodriguez is a 86 y.o. female with a hx of SVT s/p ablation, possible PAF(remote) without documentation on ECG, hypertension, and disastolic dysfunction. After SVT ablation, flecainide and anticoagulation was discontinued.  She has noticed increasing shortness of breath on exertion over the past several months.  There is no dyspnea at rest or when supine.  She has not had swelling.  She denies palpitations and syncope.    Past Medical History:  Diagnosis Date  . Arthritis   . Depression    PT'S HUSBAND AND SON HAVE DIED W/IN PAST YEAR Jul 21, 2011  . GERD (gastroesophageal reflux disease)   . Hearing loss   . Hyperlipidemia   . Idiopathic peripheral neuropathy 07/18/2016  . Nocturnal leg cramps 01/22/2017  . Stroke ([facility])   . SVT (supraventricular tachycardia) ([facility]) 09/2014  . Thyroid disease     Past Surgical History:  Procedure Laterality Date  . APPENDECTOMY    . BACK SURGERY     lumbar  . BLADDER SURGERY    . BUNIONECTOMY     left and right with hammer toe repair  . CARPAL TUNNEL RELEASE Bilateral 07-21-15  . ELECTROPHYSIOLOGIC STUDY N/A 11/03/2014   Procedure: SVT Ablation;  Surgeon: Marinus Maw, MD;  [facility]: [facility];  Service: Cardiovascular;  Laterality: N/A;  . EYE SURGERY     bilateral cataract extraction with IOL  . FOOT SURGERY    . KNEE ARTHROSCOPY  02/09/2011   Procedure: ARTHROSCOPY KNEE;  Surgeon: Jacki Cones;  Location: [facility];  Service: Orthopedics;  Laterality: Left;  Left Knee Arthroscopy with Menisectomy medial, abrasion chondroplasty medial, and synovectomy, supra patella pouch.  Marland Kitchen NASAL SINUS SURGERY    . OOPHORECTOMY    . ROTATOR CUFF REPAIR   1/13,  3/13   x 3  right/  . SALPINGECTOMY    . SHOULDER OPEN ROTATOR CUFF REPAIR Left 20-Jul-2012   Procedure: LEFT SHOULDER ROTATOR CUFF REPAIR WITH ANCHORS ;  Surgeon: Jacki Cones, MD;  Location: [facility];  Service: Orthopedics;  Laterality: Left;  . TOTAL KNEE ARTHROPLASTY  09/05/2011   Procedure: TOTAL KNEE ARTHROPLASTY;  Surgeon: Jacki Cones, MD;  Location: [facility];  Service: Orthopedics;  Laterality: Left;    Current Medications: Current Meds  Medication Sig  . aspirin EC 81 MG tablet Take 81 mg by mouth daily.  Marland Kitchen atorvastatin (LIPITOR) 40 MG tablet Take 40 mg by mouth every evening.   . baclofen (LIORESAL) 10 MG tablet Take 1 tablet (10 mg total) at bedtime by mouth.  . beta carotene w/minerals (OCUVITE) tablet Take 1 tablet by mouth daily with breakfast.   . BIOTIN PO Take 1 tablet by mouth daily.  . calcium carbonate (OS-CAL) 600 MG TABS Take 1,800 mg by mouth daily with breakfast.   . cholecalciferol (VITAMIN D) 1000 UNITS tablet Take 1,000 Units by mouth daily.   Marland Kitchen escitalopram (LEXAPRO) 5 MG tablet Take 5 mg by mouth daily.  Marland Kitchen esomeprazole (NEXIUM) 20 MG capsule Take 20 mg by mouth every morning.   Marland Kitchen oxyCODONE-acetaminophen (PERCOCET) 10-325 MG tablet Take 1 tablet by mouth every 6 (  six) hours as needed for moderate pain.   . polyethylene glycol (MIRALAX / GLYCOLAX) packet Take 17 g by mouth every other day.   . vitamin B-12 (CYANOCOBALAMIN) 100 MCG tablet Take 100 mcg by mouth daily.     Allergies:   Patient has no known allergies.   Social History   Socioeconomic History  . Marital status: Widowed    Spouse name: Not on file  . Number of children: 2  . Years of education: Not on file  . Highest education level: Not on file  Occupational History  . Occupation: Retired  Tobacco Use  . Smoking status: Former Smoker    Packs/day: 0.00    Years: 0.00    Pack years: 0.00    Types: Cigarettes    Quit date: 02/04/1974    Years since quitting: 45.2  . Smokeless  tobacco: Never Used  Substance and Sexual Activity  . Alcohol use: Yes    Alcohol/week: 1.0 standard drinks    Types: 1 Glasses of wine per week    Comment: socially  . Drug use: No  . Sexual activity: Not on file  Other Topics Concern  . Not on file  Social History Narrative   Lives alone   Caffeine use: 4 drinks coffee per day   Right-handed   Social Determinants of Health   Financial Resource Strain:   . Difficulty of Paying Living Expenses: Not on file  Food Insecurity:   . Worried About Charity fundraiser in the Last Year: Not on file  . Ran Out of Food in the Last Year: Not on file  Transportation Needs:   . Lack of Transportation (Medical): Not on file  . Lack of Transportation (Non-Medical): Not on file  Physical Activity:   . Days of Exercise per Week: Not on file  . Minutes of Exercise per Session: Not on file  Stress:   . Feeling of Stress : Not on file  Social Connections:   . Frequency of Communication with Friends and Family: Not on file  . Frequency of Social Gatherings with Friends and Family: Not on file  . Attends Religious Services: Not on file  . Active Member of Clubs or Organizations: Not on file  . Attends Archivist Meetings: Not on file  . Marital Status: Not on file     Family History: The patient's family history includes Diabetes in her mother and son.  ROS:   Please see the history of present illness.    She has been an active physically during COVID-19 pandemic.  She does have kyphoscoliosis.  All other systems reviewed and are negative.  EKGs/Labs/Other Studies Reviewed:    The following studies were reviewed today:   EKG:  EKG normal sinus rhythm with no significant abnormality noted.  Recent Labs: No results found for requested labs within last 8760 hours.  Recent Lipid Panel    Component Value Date/Time   CHOL 161 11/03/2014 0450   TRIG 130 11/03/2014 0450   HDL 47 11/03/2014 0450   CHOLHDL 3.4 11/03/2014 0450     VLDL 26 11/03/2014 0450   LDLCALC 88 11/03/2014 0450    Physical Exam:    VS:  BP 108/68   Pulse 80   Ht 5\' 6"  (1.676 m)   Wt 142 lb 12.8 oz (64.8 kg)   SpO2 99%   BMI 23.05 kg/m     Wt Readings from Last 3 Encounters:  05/05/19 142 lb 12.8 oz (64.8 kg)  09/22/18 138 lb (62.6 kg)  07/01/18 140 lb (63.5 kg)     GEN: None. No acute distress HEENT: Normal NECK: No JVD. LYMPHATICS: No lymphadenopathy CARDIAC:  RRR without murmur, gallop, or edema. VASCULAR:  Normal Pulses. No bruits. RESPIRATORY: Significant kyphoscoliosis is noted.  Clear to auscultation without rales, wheezing or rhonchi  ABDOMEN: Soft, non-tender, non-distended, No pulsatile mass, MUSCULOSKELETAL: No deformity  SKIN: Warm and dry NEUROLOGIC:  Alert and oriented x 3 PSYCHIATRIC:  Normal affect   ASSESSMENT:    1. Chronic diastolic heart failure (HCC)   2. Chronic anticoagulation   3. History of cardiac radiofrequency ablation (RFA)   4. SVT (supraventricular tachycardia) (HCC)   5. Dyspnea on exertion   6. Educated about COVID-19 virus infection    PLAN:    In order of problems listed above:  1. There is no evidence of volume overload on exam. 2. Anticoagulation therapy is not being used.  This problem will be removed from the problem list. 3. Successful radiofrequency ablation with resolution of recurrent palpitations 4. No recurrence of SVT 5. Dyspnea on exertion likely multifactorial including diastolic dysfunction, physical deconditioning, and kyphoscoliosis with restrictive impact on pulmonary function.  I encouraged her that as the pandemic wanes, increasing physical activity will likely return her exertional tolerance to what was previously her normal level. 6. COVID-19 vaccine has been received.  3W's is advocated.   Medication Adjustments/Labs and Tests Ordered: Current medicines are reviewed at length with the patient today.  Concerns regarding medicines are outlined above.  Orders  Placed This Encounter  Procedures  . EKG 12-Lead   No orders of the defined types were placed in this encounter.   Patient Instructions  Medication Instructions:  Your physician recommends that you continue on your current medications as directed. Please refer to the Current Medication list given to you today.  *If you need a refill on your cardiac medications before your next appointment, please call your pharmacy*  Lab Work: None If you have labs (blood work) drawn today and your tests are completely normal, you will receive your results only by: Marland Kitchen MyChart Message (if you have MyChart) OR . A paper copy in the mail If you have any lab test that is abnormal or we need to change your treatment, we will call you to review the results.  Testing/Procedures: None  Follow-Up: At [facility], you and your health needs are our priority.  As part of our continuing mission to provide you with exceptional heart care, we have created designated Provider Care Teams.  These Care Teams include your primary Cardiologist (physician) and Advanced Practice Providers (APPs -  Physician Assistants and Nurse Practitioners) who all work together to provide you with the care you need, when you need it.  Your next appointment:   12 month(s)  The format for your next appointment:   In Person  Provider:   You may see Lesleigh Noe, MD or one of the following Advanced Practice Providers on your designated Care Team:    Norma Fredrickson, NP  Nada Boozer, NP  Georgie Chard, NP   Other Instructions      Signed, Lesleigh Noe, MD  05/05/2019 9:59 AM    [facility]

## 2019-05-05 ENCOUNTER — Encounter: Payer: Self-pay | Admitting: Interventional Cardiology

## 2019-05-05 ENCOUNTER — Other Ambulatory Visit: Payer: Self-pay

## 2019-05-05 ENCOUNTER — Ambulatory Visit: Payer: Medicare Other | Admitting: Interventional Cardiology

## 2019-05-05 VITALS — BP 108/68 | HR 80 | Ht 66.0 in | Wt 142.8 lb

## 2019-05-05 DIAGNOSIS — Z9889 Other specified postprocedural states: Secondary | ICD-10-CM

## 2019-05-05 DIAGNOSIS — I5032 Chronic diastolic (congestive) heart failure: Secondary | ICD-10-CM | POA: Diagnosis not present

## 2019-05-05 DIAGNOSIS — R0600 Dyspnea, unspecified: Secondary | ICD-10-CM

## 2019-05-05 DIAGNOSIS — Z7901 Long term (current) use of anticoagulants: Secondary | ICD-10-CM

## 2019-05-05 DIAGNOSIS — I471 Supraventricular tachycardia: Secondary | ICD-10-CM | POA: Diagnosis not present

## 2019-05-05 DIAGNOSIS — R0609 Other forms of dyspnea: Secondary | ICD-10-CM

## 2019-05-05 DIAGNOSIS — Z7189 Other specified counseling: Secondary | ICD-10-CM

## 2019-05-05 NOTE — Patient Instructions (Signed)

## 2019-05-15 DIAGNOSIS — R030 Elevated blood-pressure reading, without diagnosis of hypertension: Secondary | ICD-10-CM | POA: Diagnosis not present

## 2019-05-15 DIAGNOSIS — F419 Anxiety disorder, unspecified: Secondary | ICD-10-CM | POA: Diagnosis not present

## 2019-05-15 DIAGNOSIS — H6123 Impacted cerumen, bilateral: Secondary | ICD-10-CM | POA: Diagnosis not present

## 2019-07-20 ENCOUNTER — Emergency Department (HOSPITAL_COMMUNITY): Payer: Medicare Other

## 2019-07-20 ENCOUNTER — Emergency Department (HOSPITAL_COMMUNITY)
Admission: EM | Admit: 2019-07-20 | Discharge: 2019-07-20 | Disposition: A | Discharge status: Home / Self Care | Payer: Medicare Other | Attending: Emergency Medicine | Admitting: Emergency Medicine

## 2019-07-20 ENCOUNTER — Telehealth (HOSPITAL_COMMUNITY): Payer: Self-pay | Admitting: Emergency Medicine

## 2019-07-20 ENCOUNTER — Encounter (HOSPITAL_COMMUNITY): Payer: Self-pay | Admitting: Emergency Medicine

## 2019-07-20 ENCOUNTER — Other Ambulatory Visit: Payer: Self-pay

## 2019-07-20 DIAGNOSIS — Y998 Other external cause status: Secondary | ICD-10-CM | POA: Diagnosis not present

## 2019-07-20 DIAGNOSIS — S2232XA Fracture of one rib, left side, initial encounter for closed fracture: Secondary | ICD-10-CM | POA: Insufficient documentation

## 2019-07-20 DIAGNOSIS — Y9389 Activity, other specified: Secondary | ICD-10-CM | POA: Insufficient documentation

## 2019-07-20 DIAGNOSIS — Z87891 Personal history of nicotine dependence: Secondary | ICD-10-CM | POA: Insufficient documentation

## 2019-07-20 DIAGNOSIS — I959 Hypotension, unspecified: Secondary | ICD-10-CM | POA: Diagnosis not present

## 2019-07-20 DIAGNOSIS — W19XXXA Unspecified fall, initial encounter: Secondary | ICD-10-CM

## 2019-07-20 DIAGNOSIS — Z79899 Other long term (current) drug therapy: Secondary | ICD-10-CM | POA: Diagnosis not present

## 2019-07-20 DIAGNOSIS — R001 Bradycardia, unspecified: Secondary | ICD-10-CM | POA: Diagnosis not present

## 2019-07-20 DIAGNOSIS — Z7982 Long term (current) use of aspirin: Secondary | ICD-10-CM | POA: Diagnosis not present

## 2019-07-20 DIAGNOSIS — R0902 Hypoxemia: Secondary | ICD-10-CM | POA: Diagnosis not present

## 2019-07-20 DIAGNOSIS — Y92013 Bedroom of single-family (private) house as the place of occurrence of the external cause: Secondary | ICD-10-CM | POA: Insufficient documentation

## 2019-07-20 DIAGNOSIS — Z8673 Personal history of transient ischemic attack (TIA), and cerebral infarction without residual deficits: Secondary | ICD-10-CM | POA: Insufficient documentation

## 2019-07-20 DIAGNOSIS — W01190A Fall on same level from slipping, tripping and stumbling with subsequent striking against furniture, initial encounter: Secondary | ICD-10-CM | POA: Diagnosis not present

## 2019-07-20 DIAGNOSIS — Z96652 Presence of left artificial knee joint: Secondary | ICD-10-CM | POA: Insufficient documentation

## 2019-07-20 DIAGNOSIS — R52 Pain, unspecified: Secondary | ICD-10-CM | POA: Diagnosis not present

## 2019-07-20 DIAGNOSIS — S299XXA Unspecified injury of thorax, initial encounter: Secondary | ICD-10-CM | POA: Diagnosis present

## 2019-07-20 MED ORDER — MORPHINE SULFATE (PF) 4 MG/ML IV SOLN
4.0000 mg | Freq: Once | INTRAVENOUS | Status: AC
  Administered 2019-07-20: 4 mg via INTRAVENOUS
  Filled 2019-07-20: qty 1

## 2019-07-20 MED ORDER — HYDROCODONE-ACETAMINOPHEN 5-325 MG PO TABS: 1.0000 | ORAL_TABLET | Freq: Four times a day (QID) | ORAL | 0 refills | Status: DC | PRN

## 2019-07-20 NOTE — Discharge Instructions (Addendum)
Begin taking hydrocodone as prescribed.  Return to the emergency department if you develop difficulty breathing, high fever, or other new and concerning symptoms.

## 2019-07-20 NOTE — ED Notes (Signed)
Pateint ambulated in room w/o assistance. Pain with ambulation but steady gait.

## 2019-07-20 NOTE — Telephone Encounter (Addendum)
Patient unable to get her prescription filled form earlier since it was printed. Trouble with technology here with power outage, will try and resend.   Unfortunately unable to send electronically myself as well.

## 2019-07-20 NOTE — ED Provider Notes (Signed)
[facility]-EMERGENCY DEPT Provider Note   CSN: [id number] Arrival date & time: 07/20/19  1118     History Chief Complaint  Patient presents with  . Fall    Ann Rodriguez is a 86 y.o. female.  Patient is an 86 year old female with past medical history of prior stroke, SVT with ablation, hyperlipidemia.  She presents today for evaluation of fall.  Patient got up this morning from bed.  While she was standing at her dresser, she describes her right leg is "giving out on her" causing her to fall.  She apparently struck her ribs on the nightstand.  She denies any loss of consciousness.  She denies any headache or neck pain.  The history is provided by the patient.  Fall This is a new problem. The current episode started less than 1 hour ago. The problem occurs constantly. The problem has not changed since onset.Associated symptoms include chest pain. Exacerbated by: Movement and breathing. Nothing relieves the symptoms.       Past Medical History:  Diagnosis Date  . Arthritis   . Depression    PT'S HUSBAND AND SON HAVE DIED W/IN PAST YEAR 2011/06/29  . GERD (gastroesophageal reflux disease)   . Hearing loss   . Hyperlipidemia   . Idiopathic peripheral neuropathy 07/18/2016  . Nocturnal leg cramps 01/22/2017  . Stroke (HCC)   . SVT (supraventricular tachycardia) (HCC) 09/2014  . Thyroid disease     Patient Active Problem List   Diagnosis Date Noted  . Nocturnal leg cramps 01/22/2017  . Idiopathic peripheral neuropathy 07/18/2016  . Paresthesia of both lower extremities 06/27/2016  . TIA (transient ischemic attack) 11/03/2014  . SVT (supraventricular tachycardia) (HCC) 09/29/2014  . Chronic diastolic heart failure (HCC) 08/11/2013  . Dyspnea on effort 01/26/2013    Class: Acute  . Swelling of limb 09/02/2012  . AC joint arthropathy 06/26/2012  . Rotator cuff (capsule) sprain 06/26/2012  . Villonodular synovitis, shoulder region 06/26/2012  . Osteoarthritis  of left knee 09/05/2011  . Osteoarthritis of knee 02/09/2011    Past Surgical History:  Procedure Laterality Date  . APPENDECTOMY    . BACK SURGERY     lumbar  . BLADDER SURGERY    . BUNIONECTOMY     left and right with hammer toe repair  . CARPAL TUNNEL RELEASE Bilateral Jun 29, 2015  . ELECTROPHYSIOLOGIC STUDY N/A 11/03/2014   Procedure: SVT Ablation;  Surgeon: Marinus Maw, MD;  Location: [facility] INVASIVE CV LAB;  Service: Cardiovascular;  Laterality: N/A;  . EYE SURGERY     bilateral cataract extraction with IOL  . FOOT SURGERY    . KNEE ARTHROSCOPY  02/09/2011   Procedure: ARTHROSCOPY KNEE;  Surgeon: Jacki Cones;  Location: [facility];  Service: Orthopedics;  Laterality: Left;  Left Knee Arthroscopy with Menisectomy medial, abrasion chondroplasty medial, and synovectomy, supra patella pouch.  Marland Kitchen NASAL SINUS SURGERY    . OOPHORECTOMY    . ROTATOR CUFF REPAIR  1/13,  3/13   x 3  right/  . SALPINGECTOMY    . SHOULDER OPEN ROTATOR CUFF REPAIR Left 06/26/2012   Procedure: LEFT SHOULDER ROTATOR CUFF REPAIR WITH ANCHORS ;  Surgeon: Jacki Cones, MD;  Location: [facility];  Service: Orthopedics;  Laterality: Left;  . TOTAL KNEE ARTHROPLASTY  09/05/2011   Procedure: TOTAL KNEE ARTHROPLASTY;  Surgeon: Jacki Cones, MD;  Location: [facility];  Service: Orthopedics;  Laterality: Left;     OB History   No obstetric history  on file.     Family History  Problem Relation Age of Onset  . Diabetes Mother   . Diabetes Son     Social History   Tobacco Use  . Smoking status: Former Smoker    Packs/day: 0.00    Years: 0.00    Pack years: 0.00    Types: Cigarettes    Quit date: 02/04/1974    Years since quitting: 45.4  . Smokeless tobacco: Never Used  Substance Use Topics  . Alcohol use: Yes    Alcohol/week: 1.0 standard drinks    Types: 1 Glasses of wine per week    Comment: socially  . Drug use: No    Home Medications Prior to Admission medications   Medication Sig Start Date End  Date Taking? Authorizing Provider  aspirin EC 81 MG tablet Take 81 mg by mouth daily.    [provider]  atorvastatin (LIPITOR) 40 MG tablet Take 40 mg by mouth every evening.     [provider]  baclofen (LIORESAL) 10 MG tablet Take 1 tablet (10 mg total) at bedtime by mouth. 01/22/17   York Spaniel, MD  beta carotene w/minerals (OCUVITE) tablet Take 1 tablet by mouth daily with breakfast.     [provider]  BIOTIN PO Take 1 tablet by mouth daily.    [provider]  calcium carbonate (OS-CAL) 600 MG TABS Take 1,800 mg by mouth daily with breakfast.     [provider]  cholecalciferol (VITAMIN D) 1000 UNITS tablet Take 1,000 Units by mouth daily.     [provider]  escitalopram (LEXAPRO) 5 MG tablet Take 5 mg by mouth daily. 04/01/17   [provider]  esomeprazole (NEXIUM) 20 MG capsule Take 20 mg by mouth every morning.     [provider]  oxyCODONE-acetaminophen (PERCOCET) 10-325 MG tablet Take 1 tablet by mouth every 6 (six) hours as needed for moderate pain.  03/23/15   [provider]  polyethylene glycol (MIRALAX / GLYCOLAX) packet Take 17 g by mouth every other day.     [provider]  vitamin B-12 (CYANOCOBALAMIN) 100 MCG tablet Take 100 mcg by mouth daily.    [provider]    Allergies    Patient has no known allergies.  Review of Systems   Review of Systems  Cardiovascular: Positive for chest pain.  All other systems reviewed and are negative.   Physical Exam Updated Vital Signs BP 111/64 (BP Location: Right Arm)   Pulse (!) 56   Temp 98.2 F (36.8 C) (Oral)   Resp 18   Ht 5\' 6"  (1.676 m)   Wt 64.8 kg   SpO2 98%   BMI 23.06 kg/m   Physical Exam Vitals and nursing note reviewed.  Constitutional:      General: She is not in acute distress.    Appearance: Normal appearance. She is not ill-appearing, toxic-appearing or diaphoretic.  HENT:     Head:  Normocephalic and atraumatic.  Eyes:     Extraocular Movements: Extraocular movements intact.     Pupils: Pupils are equal, round, and reactive to light.  Neck:     Comments: There is no cervical spine tenderness or step-off.  She has painless range of motion in all directions. Cardiovascular:     Rate and Rhythm: Normal rate.     Heart sounds: No murmur.  Pulmonary:     Effort: Pulmonary effort is normal. No respiratory distress.     Comments:  There are abrasions to the left posterior chest wall.  There is no palpable abnormality or crepitus. Abdominal:     General: Abdomen is flat. There is no distension.     Tenderness: There is no abdominal tenderness.  Musculoskeletal:        General: Normal range of motion.     Cervical back: Normal range of motion and neck supple.  Skin:    General: Skin is warm and dry.  Neurological:     General: No focal deficit present.     Mental Status: She is alert and oriented to person, place, and time.     ED Results / Procedures / Treatments   Labs (all labs ordered are listed, but only abnormal results are displayed) Labs Reviewed - No data to display  EKG None  Radiology No results found.  Procedures Procedures (including critical care time)  Medications Ordered in ED Medications - No data to display  ED Course  I have reviewed the triage vital signs and the nursing notes.  Pertinent labs & imaging results that were available during my care of the patient were reviewed by me and considered in my medical decision making (see chart for details).    MDM Rules/Calculators/A&P  Patient is an 86 year old female presenting with complaints of fall.  Patient has what appears to be 1/10 rib fracture on the left, but no evidence for pneumothorax.  Patient will be treated with pain medication and as needed return.  Final Clinical Impression(s) / ED Diagnoses Final diagnoses:  None    Rx / DC Orders ED Discharge Orders    None        Veryl Speak, MD 07/20/19 1453

## 2019-07-20 NOTE — ED Triage Notes (Signed)
Arrives via EMS from home, C/C fall, she went to stand up from bed and her leg gave out, L posterior ribcage pain with abrasions and tenderness. No spinal pain upon palpation. Denies LOC or hitting her head, able to get herself back into bed, laid there and then called EMS. 20 LAC, got 100 mcg of Fentanyl with EMS. Vitals WNL.

## 2019-07-23 DIAGNOSIS — R252 Cramp and spasm: Secondary | ICD-10-CM | POA: Diagnosis not present

## 2019-07-23 DIAGNOSIS — R600 Localized edema: Secondary | ICD-10-CM | POA: Diagnosis not present

## 2019-07-23 DIAGNOSIS — S2242XD Multiple fractures of ribs, left side, subsequent encounter for fracture with routine healing: Secondary | ICD-10-CM | POA: Diagnosis not present

## 2019-07-23 DIAGNOSIS — L270 Generalized skin eruption due to drugs and medicaments taken internally: Secondary | ICD-10-CM | POA: Diagnosis not present

## 2019-07-25 ENCOUNTER — Encounter (HOSPITAL_COMMUNITY): Payer: Self-pay | Admitting: Emergency Medicine

## 2019-07-25 ENCOUNTER — Emergency Department (HOSPITAL_COMMUNITY)
Admission: EM | Admit: 2019-07-25 | Discharge: 2019-07-25 | Disposition: A | Discharge status: Home / Self Care | Payer: Medicare Other | Attending: Emergency Medicine | Admitting: Emergency Medicine

## 2019-07-25 ENCOUNTER — Other Ambulatory Visit: Payer: Self-pay

## 2019-07-25 ENCOUNTER — Emergency Department (HOSPITAL_COMMUNITY): Payer: Medicare Other

## 2019-07-25 DIAGNOSIS — Z96652 Presence of left artificial knee joint: Secondary | ICD-10-CM | POA: Insufficient documentation

## 2019-07-25 DIAGNOSIS — Y92017 Garden or yard in single-family (private) house as the place of occurrence of the external cause: Secondary | ICD-10-CM | POA: Insufficient documentation

## 2019-07-25 DIAGNOSIS — Z8673 Personal history of transient ischemic attack (TIA), and cerebral infarction without residual deficits: Secondary | ICD-10-CM | POA: Insufficient documentation

## 2019-07-25 DIAGNOSIS — Y93H2 Activity, gardening and landscaping: Secondary | ICD-10-CM | POA: Diagnosis not present

## 2019-07-25 DIAGNOSIS — W01198A Fall on same level from slipping, tripping and stumbling with subsequent striking against other object, initial encounter: Secondary | ICD-10-CM | POA: Insufficient documentation

## 2019-07-25 DIAGNOSIS — Y998 Other external cause status: Secondary | ICD-10-CM | POA: Diagnosis not present

## 2019-07-25 DIAGNOSIS — W19XXXA Unspecified fall, initial encounter: Secondary | ICD-10-CM

## 2019-07-25 DIAGNOSIS — S0990XA Unspecified injury of head, initial encounter: Secondary | ICD-10-CM | POA: Diagnosis present

## 2019-07-25 DIAGNOSIS — S2232XA Fracture of one rib, left side, initial encounter for closed fracture: Secondary | ICD-10-CM | POA: Diagnosis not present

## 2019-07-25 DIAGNOSIS — Z79899 Other long term (current) drug therapy: Secondary | ICD-10-CM | POA: Insufficient documentation

## 2019-07-25 DIAGNOSIS — Z87891 Personal history of nicotine dependence: Secondary | ICD-10-CM | POA: Insufficient documentation

## 2019-07-25 DIAGNOSIS — Z7982 Long term (current) use of aspirin: Secondary | ICD-10-CM | POA: Diagnosis not present

## 2019-07-25 DIAGNOSIS — S0083XA Contusion of other part of head, initial encounter: Secondary | ICD-10-CM | POA: Diagnosis not present

## 2019-07-25 NOTE — ED Provider Notes (Signed)
[facility]-EMERGENC[facility] DEPT Provider Note   CSN: [id number] Arrival date & time: 07/25/19  1508     History Chief Complaint  Patient presents with  . Fall    Ann Rodriguez is a 86 y.o. female.  86 year old female with prior medical history as detailed below presents for evaluation following a fall.  Patient reports that she was working at home in her garden when she lost her balance and tripped over a hose.  She struck her left temple against the concrete floor.  She denies LOC.  She denies neck pain.  She denies other injury to her extremities.  She is ambulatory post the fall.  Of note, patient reports that she had a similar accidental fall approximately 1 week ago and was told by her regular doctor that she had 1 broken rib on the left.  She reports that this is been a persistent uncomfortable pain since the prior fall.  The history is provided by the patient and medical records.  Fall This is a new problem. The current episode started 1 to 2 hours ago. The problem occurs rarely. The problem has not changed since onset.Pertinent negatives include no chest pain and no abdominal pain. Nothing aggravates the symptoms. Nothing relieves the symptoms.       Past Medical History:  Diagnosis Date  . Arthritis   . Depression    PT'S HUSBAND AND SON HAVE DIED W/IN PAST YEAR 07/11/2011  . GERD (gastroesophageal reflux disease)   . Hearing loss   . Hyperlipidemia   . Idiopathic peripheral neuropathy 07/18/2016  . Nocturnal leg cramps 01/22/2017  . Stroke (HCC)   . SVT (supraventricular tachycardia) (HCC) 09/2014  . Thyroid disease     Patient Active Problem List   Diagnosis Date Noted  . Nocturnal leg cramps 01/22/2017  . Idiopathic peripheral neuropathy 07/18/2016  . Paresthesia of both lower extremities 06/27/2016  . TIA (transient ischemic attack) 11/03/2014  . SVT (supraventricular tachycardia) (HCC) 09/29/2014  . Chronic diastolic heart failure (HCC) 08/11/2013    . Dyspnea on effort 01/26/2013    Class: Acute  . Swelling of limb 09/02/2012  . AC joint arthropathy 06/26/2012  . Rotator cuff (capsule) sprain 06/26/2012  . Villonodular synovitis, shoulder region 06/26/2012  . Osteoarthritis of left knee 09/05/2011  . Osteoarthritis of knee 02/09/2011    Past Surgical History:  Procedure Laterality Date  . APPENDECTOMY    . BACK SURGERY     lumbar  . BLADDER SURGERY    . BUNIONECTOMY     left and right with hammer toe repair  . CARPAL TUNNEL RELEASE Bilateral 07/11/2015  . ELECTROPHYSIOLOGIC STUDY N/A 11/03/2014   Procedure: SVT Ablation;  Surgeon: Marinus Maw, MD;  Location: [facility] INVASIVE CV LAB;  Service: Cardiovascular;  Laterality: N/A;  . EYE SURGERY     bilateral cataract extraction with IOL  . FOOT SURGERY    . KNEE ARTHROSCOPY  02/09/2011   Procedure: ARTHROSCOPY KNEE;  Surgeon: Jacki Cones;  Location: [facility];  Service: Orthopedics;  Laterality: Left;  Left Knee Arthroscopy with Menisectomy medial, abrasion chondroplasty medial, and synovectomy, supra patella pouch.  Marland Kitchen NASAL SINUS SURGERY    . OOPHORECTOMY    . ROTATOR CUFF REPAIR  1/13,  3/13   x 3  right/  . SALPINGECTOMY    . SHOULDER OPEN ROTATOR CUFF REPAIR Left 06/26/2012   Procedure: LEFT SHOULDER ROTATOR CUFF REPAIR WITH ANCHORS ;  Surgeon: Jacki Cones, MD;  Location: [facility];  Service: Orthopedics;  Laterality: Left;  . TOTAL KNEE ARTHROPLASTY  09/05/2011   Procedure: TOTAL KNEE ARTHROPLASTY;  Surgeon: Jacki Cones, MD;  Location: [facility];  Service: Orthopedics;  Laterality: Left;     OB History   No obstetric history on file.     Family History  Problem Relation Age of Onset  . Diabetes Mother   . Diabetes Son     Social History   Tobacco Use  . Smoking status: Former Smoker    Packs/day: 0.00    Years: 0.00    Pack years: 0.00    Types: Cigarettes    Quit date: 02/04/1974    Years since quitting: 45.4  . Smokeless tobacco: Never Used  Substance  Use Topics  . Alcohol use: Yes    Alcohol/week: 1.0 standard drinks    Types: 1 Glasses of wine per week    Comment: socially  . Drug use: No    Home Medications Prior to Admission medications   Medication Sig Start Date End Date Taking? Authorizing Provider  aspirin EC 81 MG tablet Take 81 mg by mouth daily.    [provider]  atorvastatin (LIPITOR) 40 MG tablet Take 40 mg by mouth every evening.     [provider]  baclofen (LIORESAL) 10 MG tablet Take 1 tablet (10 mg total) at bedtime by mouth. 01/22/17   York Spaniel, MD  beta carotene w/minerals (OCUVITE) tablet Take 1 tablet by mouth daily with breakfast.     [provider]  BIOTIN PO Take 1 tablet by mouth daily.    [provider]  calcium carbonate (OS-CAL) 600 MG TABS Take 1,800 mg by mouth daily with breakfast.     [provider]  cholecalciferol (VITAMIN D) 1000 UNITS tablet Take 1,000 Units by mouth daily.     [provider]  escitalopram (LEXAPRO) 5 MG tablet Take 5 mg by mouth daily. 04/01/17   [provider]  esomeprazole (NEXIUM) 20 MG capsule Take 20 mg by mouth every morning.     [provider]  HYDROcodone-acetaminophen (NORCO) 5-325 MG tablet Take 1-2 tablets by mouth every 6 (six) hours as needed. 07/20/19   Geoffery Lyons, MD  oxyCODONE-acetaminophen (PERCOCET) 10-325 MG tablet Take 1 tablet by mouth every 6 (six) hours as needed for moderate pain.  03/23/15   [provider]  polyethylene glycol (MIRALAX / GLYCOLAX) packet Take 17 g by mouth every other day.     [provider]  vitamin B-12 (CYANOCOBALAMIN) 100 MCG tablet Take 100 mcg by mouth daily.    [provider]    Allergies    Patient has no known allergies.  Review of Systems   Review of Systems  Cardiovascular: Negative for chest pain.  Gastrointestinal: Negative for abdominal pain.  All other systems reviewed and are negative.   Physical  Exam Updated Vital Signs BP (!) 150/68 (BP Location: Right Arm)   Pulse 62   Temp 98.1 F (36.7 C) (Oral)   Resp 16   SpO2 100%   Physical Exam Vitals and nursing note reviewed.  Constitutional:      General: She is not in acute distress.    Appearance: She is well-developed.  HENT:     Head: Normocephalic.     Comments: Moderate ecchymosis and abrasion to the left temple extending underneath the left eye. Eyes:     Extraocular Movements: Extraocular movements intact.     Conjunctiva/sclera:  Conjunctivae normal.     Pupils: Pupils are equal, round, and reactive to light.     Comments: Mild contusion lateral to the left eye.  Cardiovascular:     Rate and Rhythm: Normal rate and regular rhythm.     Heart sounds: Normal heart sounds.  Pulmonary:     Effort: Pulmonary effort is normal. No respiratory distress.     Breath sounds: Normal breath sounds.  Abdominal:     General: There is no distension.     Palpations: Abdomen is soft.     Tenderness: There is no abdominal tenderness.  Musculoskeletal:        General: No deformity. Normal range of motion.     Cervical back: Normal range of motion and neck supple.  Skin:    General: Skin is warm and dry.  Neurological:     General: No focal deficit present.     Mental Status: She is alert and oriented to person, place, and time.     Comments: GCS 15     ED Results / Procedures / Treatments   Labs (all labs ordered are listed, but only abnormal results are displayed) Labs Reviewed - No data to display  EKG None  Radiology DG Ribs Unilateral W/Chest Left  Result Date: 07/25/2019 CLINICAL DATA:  Left rib pain after fall. Trip and fall while watering plants today. Previous fall 1 week ago. EXAM: LEFT RIBS AND CHEST - 3+ VIEW COMPARISON:  Chest and left rib radiographs 07/20/2019. FINDINGS: Unchanged minimally displaced subacute left posterior tenth rib fracture since exam 5 days ago. No additional or new acute fracture.  Remote healed rib fractures of posterior left 5, 6, and seventh ribs are unchanged. The bones are diffusely under mineralized. Kyphoplasty within thoracic vertebra again seen. No pneumothorax or evidence of pulmonary contusion. Minimal left pleural thickening versus small effusion. Unchanged heart size and mediastinal contours with aortic atherosclerosis and tortuosity. Tapering of the distal left clavicle is likely postsurgical. IMPRESSION: 1. Unchanged minimally displaced subacute left posterior tenth rib fracture since exam 5 days ago. Remote healed fifth through seventh left rib fractures again seen. No additional or new acute fracture. 2. Minimal left pleural thickening/effusion.  No pneumothorax. Electronically Signed   By: Narda Rutherford M.D.   On: 07/25/2019 16:04   CT Head Wo Contrast  Result Date: 07/25/2019 CLINICAL DATA:  Trip and fall, bruising to forehead EXAM: CT HEAD WITHOUT CONTRAST TECHNIQUE: Contiguous axial images were obtained from the base of the skull through the vertex without intravenous contrast. COMPARISON:  CT 03/29/2011, MRI 11/02/2014 FINDINGS: Brain: No evidence of acute infarction, hemorrhage, hydrocephalus, extra-axial collection or mass lesion/mass effect. Symmetric prominence of the ventricles, cisterns and sulci compatible with parenchymal volume loss. Patchy areas of white matter hypoattenuation are most compatible with chronic microvascular angiopathy. Vascular: Atherosclerotic calcification of the carotid siphons. No hyperdense vessel. Skull: Left frontotemporal and lateral periorbital soft tissue swelling with crescentic hematoma measuring up to 6 mm in maximal thickness. No visible calvarial fracture or subjacent fracture of the included facial bones. Tiny benign right frontal osteoma (3/15) Sinuses/Orbits: Paranasal sinuses and mastoid air cells are predominantly clear. Left periorbital swelling and palpebral thickening without retro septal gas, stranding or  hemorrhage. Orbital structures are otherwise unremarkable aside from prior lens extractions. Other: None IMPRESSION: 1. Left frontotemporal and lateral periorbital soft tissue swelling with crescentic hematoma. No visible calvarial fracture or subjacent fracture of the included facial bones. 2. No acute intracranial abnormality. 3. Chronic microvascular angiopathy  and parenchymal volume loss. Electronically Signed   By: Lovena Le M.D.   On: 07/25/2019 16:09    Procedures Procedures (including critical care time)  Medications Ordered in ED Medications - No data to display  ED Course  I have reviewed the triage vital signs and the nursing notes.  Pertinent labs & imaging results that were available during my care of the patient were reviewed by me and considered in my medical decision making (see chart for details).    MDM Rules/Calculators/A&P                      MDM  Screen complete  Ann Rodriguez was evaluated in Emergency Department on 07/25/2019 for the symptoms described in the history of present illness. She was evaluated in the context of the global COVID-19 pandemic, which necessitated consideration that the patient might be at risk for infection with the SARS-CoV-2 virus that causes COVID-19. Institutional protocols and algorithms that pertain to the evaluation of patients at risk for COVID-19 are in a state of rapid change based on information released by regulatory bodies including the CDC and federal and state organizations. These policies and algorithms were followed during the patient's care in the ED.  Presented for evaluation of injury secondary to fall.  Patient's exam suggests no significant traumatic injury.  ED imaging did not reveal significant intracranial injury.  Plain films of the chest revealed previously diagnosed left 10th rib fracture.  There is no new finding such as developing pneumothorax or additional fractures.  Patient does feel improved following her  ED evaluation.  She has home pain medications available.  She now desires discharge.  She and her daughter are aware of the need for close follow-up.  Strict return precautions given and understood.   Final Clinical Impression(s) / ED Diagnoses Final diagnoses:  Fall, initial encounter  Contusion of face, initial encounter    Rx / DC Orders ED Discharge Orders    None       Valarie Merino, MD 07/25/19 [phone]

## 2019-07-25 NOTE — Discharge Instructions (Addendum)
Please return for any problem.  Follow-up with your regular care provider as instructed. °

## 2019-07-25 NOTE — ED Triage Notes (Signed)
Patient reports trip and fall while watering plants today. Bruising to left eye. Denies LOC. Reports back pain from fall earlier this week. Denies taking blood thinner.

## 2019-07-25 NOTE — ED Notes (Signed)
Ice applied to left eye

## 2019-07-27 DIAGNOSIS — L270 Generalized skin eruption due to drugs and medicaments taken internally: Secondary | ICD-10-CM | POA: Diagnosis not present

## 2019-07-27 DIAGNOSIS — S2242XD Multiple fractures of ribs, left side, subsequent encounter for fracture with routine healing: Secondary | ICD-10-CM | POA: Diagnosis not present

## 2019-07-27 DIAGNOSIS — R600 Localized edema: Secondary | ICD-10-CM | POA: Diagnosis not present

## 2019-07-28 DIAGNOSIS — L508 Other urticaria: Secondary | ICD-10-CM | POA: Diagnosis not present

## 2019-08-11 DIAGNOSIS — Z0120 Encounter for dental examination and cleaning without abnormal findings: Secondary | ICD-10-CM | POA: Diagnosis not present

## 2019-09-03 DIAGNOSIS — R29898 Other symptoms and signs involving the musculoskeletal system: Secondary | ICD-10-CM | POA: Diagnosis not present

## 2019-09-03 DIAGNOSIS — M546 Pain in thoracic spine: Secondary | ICD-10-CM | POA: Diagnosis not present

## 2019-09-03 DIAGNOSIS — R109 Unspecified abdominal pain: Secondary | ICD-10-CM | POA: Diagnosis not present

## 2019-09-03 DIAGNOSIS — R0781 Pleurodynia: Secondary | ICD-10-CM | POA: Diagnosis not present

## 2019-09-04 ENCOUNTER — Other Ambulatory Visit: Payer: Self-pay

## 2019-09-04 ENCOUNTER — Other Ambulatory Visit: Payer: Self-pay | Admitting: Family Medicine

## 2019-09-04 ENCOUNTER — Ambulatory Visit
Admission: RE | Admit: 2019-09-04 | Discharge: 2019-09-04 | Disposition: A | Discharge status: Home / Self Care | Payer: Medicare Other | Source: Ambulatory Visit | Attending: Family Medicine | Admitting: Family Medicine

## 2019-09-04 DIAGNOSIS — R0781 Pleurodynia: Secondary | ICD-10-CM

## 2019-09-04 DIAGNOSIS — R109 Unspecified abdominal pain: Secondary | ICD-10-CM

## 2019-09-04 DIAGNOSIS — M545 Low back pain: Secondary | ICD-10-CM | POA: Diagnosis not present

## 2019-09-04 DIAGNOSIS — S2232XA Fracture of one rib, left side, initial encounter for closed fracture: Secondary | ICD-10-CM | POA: Diagnosis not present

## 2019-09-04 DIAGNOSIS — M546 Pain in thoracic spine: Secondary | ICD-10-CM

## 2019-09-04 DIAGNOSIS — R296 Repeated falls: Secondary | ICD-10-CM | POA: Diagnosis not present

## 2019-09-23 ENCOUNTER — Ambulatory Visit: Payer: Medicare Other | Admitting: Adult Health

## 2019-09-23 ENCOUNTER — Other Ambulatory Visit: Payer: Self-pay

## 2019-09-23 ENCOUNTER — Encounter: Payer: Self-pay | Admitting: Adult Health

## 2019-09-23 VITALS — BP 128/82 | HR 74 | Ht 66.0 in | Wt 128.8 lb

## 2019-09-23 DIAGNOSIS — G609 Hereditary and idiopathic neuropathy, unspecified: Secondary | ICD-10-CM

## 2019-09-23 NOTE — Patient Instructions (Signed)
Your Plan:  Continue to monitor symptoms If your symptoms worsen or you develop new symptoms please let us know.   Thank you for coming to see us at [facility]. I hope we have been able to provide you high quality care today.  You may receive a patient satisfaction survey over the next few weeks. We would appreciate your feedback and comments so that we may continue to improve ourselves and the health of our patients.   

## 2019-09-23 NOTE — Progress Notes (Signed)
PATIENT: Ann Rodriguez DOB: 01/11/1929  REASON FOR VISIT: follow up HISTORY FROM: patient  HISTORY OF PRESENT ILLNESS: Today 09/23/19:  Ms. Ann Rodriguez is an 86 year old female with a history of peripheral neuropathy.  She returns today for follow-up.  She reports that she does not have any discomfort in the legs.  She only has numbness that extends up to the calf muscle.  She denies any discomfort in the legs.  She only has numbness that extends up to the calf muscle.  She states that she did have 2 falls in May.  She reports 1 fall her right leg gave out and she broke a rib.  She states that later in the month she was out watering flowers and stepped back on the hose and it made her fall.  Since then she is not had any additional falls.  She does not use a cane or a walker.  She returns today for an evaluation.  HISTORY 09/22/18:  Ms. Ann Rodriguez is an 86 year old female with a history of peripheral neuropathy.  She returns today for follow-up.  She denies any discomfort in the lower extremities.  Reports that she continues to have numbness that extends up the leg to the knees bilaterally.  She reports that she has noticed some changes with her balance but has not had any recent falls.  She does not use a cane or walker when ambulating.  She reports that she has noticed some numbness in the left hand.  Reports that she has previously had carpal tunnel surgery on both hands.  She states that she is not having any numbness in the right hand.    She states that she has not been taking baclofen regularly.  She still has nocturnal leg cramps.  She states that she typically just gets up and takes mustard.  She returns today for follow-up.   REVIEW OF SYSTEMS: Out of a complete 14 system review of symptoms, the patient complains only of the following symptoms, and all other reviewed systems are negative.  See HPI  ALLERGIES: No Known Allergies  HOME MEDICATIONS: Outpatient Medications Prior to Visit    Medication Sig Dispense Refill  . aspirin EC 81 MG tablet Take 81 mg by mouth daily.    Marland Kitchen. atorvastatin (LIPITOR) 40 MG tablet Take 40 mg by mouth every evening.     . baclofen (LIORESAL) 10 MG tablet Take 1 tablet (10 mg total) at bedtime by mouth. 30 each 3  . beta carotene w/minerals (OCUVITE) tablet Take 1 tablet by mouth daily with breakfast.     . BIOTIN PO Take 1 tablet by mouth daily.    . calcium carbonate (OS-CAL) 600 MG TABS Take 1,800 mg by mouth daily with breakfast.     . cholecalciferol (VITAMIN D) 1000 UNITS tablet Take 1,000 Units by mouth daily.     Marland Kitchen. escitalopram (LEXAPRO) 5 MG tablet Take 5 mg by mouth daily.  0  . esomeprazole (NEXIUM) 20 MG capsule Take 20 mg by mouth every morning.     . polyethylene glycol (MIRALAX / GLYCOLAX) packet Take 17 g by mouth every other day.     . vitamin B-12 (CYANOCOBALAMIN) 100 MCG tablet Take 100 mcg by mouth daily.    Marland Kitchen. HYDROcodone-acetaminophen (NORCO) 5-325 MG tablet Take 1-2 tablets by mouth every 6 (six) hours as needed. 20 tablet 0  . oxyCODONE-acetaminophen (PERCOCET) 10-325 MG tablet Take 1 tablet by mouth every 6 (six) hours as needed for moderate pain.  0   No facility-administered medications prior to visit.    PAST MEDICAL HISTORY: Past Medical History:  Diagnosis Date  . Arthritis   . Depression    PT'S HUSBAND AND SON HAVE DIED W/IN PAST YEAR 02-Jul-2011  . GERD (gastroesophageal reflux disease)   . Hearing loss   . Hyperlipidemia   . Idiopathic peripheral neuropathy 07/18/2016  . Nocturnal leg cramps 01/22/2017  . Stroke (HCC)   . SVT (supraventricular tachycardia) (HCC) 09/2014  . Thyroid disease     PAST SURGICAL HISTORY: Past Surgical History:  Procedure Laterality Date  . APPENDECTOMY    . BACK SURGERY     lumbar  . BLADDER SURGERY    . BUNIONECTOMY     left and right with hammer toe repair  . CARPAL TUNNEL RELEASE Bilateral Jul 02, 2015  . ELECTROPHYSIOLOGIC STUDY N/A 11/03/2014   Procedure: SVT Ablation;   Surgeon: Marinus Maw, MD;  Location: [facility] INVASIVE CV LAB;  Service: Cardiovascular;  Laterality: N/A;  . EYE SURGERY     bilateral cataract extraction with IOL  . FOOT SURGERY    . KNEE ARTHROSCOPY  02/09/2011   Procedure: ARTHROSCOPY KNEE;  Surgeon: Jacki Cones;  Location: [facility];  Service: Orthopedics;  Laterality: Left;  Left Knee Arthroscopy with Menisectomy medial, abrasion chondroplasty medial, and synovectomy, supra patella pouch.  Marland Kitchen NASAL SINUS SURGERY    . OOPHORECTOMY    . ROTATOR CUFF REPAIR  1/13,  3/13   x 3  right/  . SALPINGECTOMY    . SHOULDER OPEN ROTATOR CUFF REPAIR Left 06/26/2012   Procedure: LEFT SHOULDER ROTATOR CUFF REPAIR WITH ANCHORS ;  Surgeon: Jacki Cones, MD;  Location: [facility];  Service: Orthopedics;  Laterality: Left;  . TOTAL KNEE ARTHROPLASTY  09/05/2011   Procedure: TOTAL KNEE ARTHROPLASTY;  Surgeon: Jacki Cones, MD;  Location: [facility];  Service: Orthopedics;  Laterality: Left;    FAMILY HISTORY: Family History  Problem Relation Age of Onset  . Diabetes Mother   . Diabetes Son     SOCIAL HISTORY: Social History   Socioeconomic History  . Marital status: Widowed    Spouse name: Not on file  . Number of children: 2  . Years of education: Not on file  . Highest education level: Not on file  Occupational History  . Occupation: Retired  Tobacco Use  . Smoking status: Former Smoker    Packs/day: 0.00    Years: 0.00    Pack years: 0.00    Types: Cigarettes    Quit date: 02/04/1974    Years since quitting: 45.6  . Smokeless tobacco: Never Used  Substance and Sexual Activity  . Alcohol use: Yes    Alcohol/week: 1.0 standard drink    Types: 1 Glasses of wine per week    Comment: socially  . Drug use: No  . Sexual activity: Not on file  Other Topics Concern  . Not on file  Social History Narrative   Lives alone   Caffeine use: 4 drinks coffee per day   Right-handed   Social Determinants of Health   Financial Resource  Strain:   . Difficulty of Paying Living Expenses:   Food Insecurity:   . Worried About Programme researcher, broadcasting/film/video in the Last Year:   . Barista in the Last Year:   Transportation Needs:   . Freight forwarder (Medical):   Marland Kitchen Lack of Transportation (Non-Medical):   Physical Activity:   . Days of  Exercise per Week:   . Minutes of Exercise per Session:   Stress:   . Feeling of Stress :   Social Connections:   . Frequency of Communication with Friends and Family:   . Frequency of Social Gatherings with Friends and Family:   . Attends Religious Services:   . Active Member of Clubs or Organizations:   . Attends Banker Meetings:   Marland Kitchen Marital Status:   Intimate Partner Violence:   . Fear of Current or Ex-Partner:   . Emotionally Abused:   Marland Kitchen Physically Abused:   . Sexually Abused:       PHYSICAL EXAM  Vitals:   09/23/19 0923  BP: 128/82  Pulse: 74  Weight: 128 lb 12.8 oz (58.4 kg)  Height: 5\' 6"  (1.676 m)   Body mass index is 20.79 kg/m.  Generalized: Well developed, in no acute distress   Neurological examination  Mentation: Alert oriented to time, place, history taking. Follows all commands speech and language fluent Cranial nerve II-XII: Pupils were equal round reactive to light. Extraocular movements were full, visual field were full on confrontational test.  Head turning and shoulder shrug  were normal and symmetric. Motor: The motor testing reveals 5 over 5 strength of all 4 extremities. Good symmetric motor tone is noted throughout.  Sensory: Sensory testing is intact to soft touch on all 4 extremities. No evidence of extinction is noted.  Coordination: Cerebellar testing reveals good finger-nose-finger and heel-to-shin bilaterally.  Gait and station: Gait is normal.  Reflexes: Deep tendon reflexes are symmetric and normal bilaterally.   DIAGNOSTIC DATA (LABS, IMAGING, TESTING) - I reviewed patient records, labs, notes, testing and imaging myself  where available.  Lab Results  Component Value Date   WBC 8.2 03/01/2017   HGB 13.0 03/01/2017   HCT 39.2 03/01/2017   MCV 93.8 03/01/2017   PLT 250 03/01/2017      Component Value Date/Time   NA 138 03/01/2017 1105   K 3.6 03/01/2017 1105   CL 103 03/01/2017 1105   CO2 25 03/01/2017 1105   GLUCOSE 96 03/01/2017 1105   BUN 12 03/01/2017 1105   CREATININE 0.50 03/01/2017 1105   CALCIUM 8.9 03/01/2017 1105   PROT 6.9 03/01/2017 1105   PROT 6.8 06/27/2016 0935   ALBUMIN 3.4 (L) 03/01/2017 1105   AST 24 03/01/2017 1105   ALT 15 03/01/2017 1105   ALKPHOS 75 03/01/2017 1105   BILITOT 1.1 03/01/2017 1105   GFRNONAA >60 03/01/2017 1105   GFRAA >60 03/01/2017 1105   Lab Results  Component Value Date   CHOL 161 11/03/2014   HDL 47 11/03/2014   LDLCALC 88 11/03/2014   TRIG 130 11/03/2014   CHOLHDL 3.4 11/03/2014    Lab Results  Component Value Date   VITAMINB12 1,796 (H) 06/27/2016   Lab Results  Component Value Date   TSH 3.943 10/31/2014      ASSESSMENT AND PLAN 86 y.o. year old female  has a past medical history of Arthritis, Depression, GERD (gastroesophageal reflux disease), Hearing loss, Hyperlipidemia, Idiopathic peripheral neuropathy (07/18/2016), Nocturnal leg cramps (01/22/2017), Stroke (HCC), SVT (supraventricular tachycardia) (HCC) (09/2014), and Thyroid disease. here with:  peripheral neuropathy   Continue to monitor symptoms  Advised if she has frequent falls we may need to consider physical therapy or an assistive device  Follow-up on an as needed basis  I spent 30 minutes of face-to-face and non-face-to-face time with patient.  This included previsit chart review, lab review, study review,  order entry, electronic health record documentation, patient education.  Butch Penny, MSN, NP-C 09/23/2019, 9:37 AM [facility] [address], [address] [place], [place] [address] [phone]

## 2019-09-24 NOTE — Progress Notes (Signed)
I have read the note, and I agree with the clinical assessment and plan.  Stefon Ramthun K Skyeler Scalese   

## 2019-09-28 DIAGNOSIS — E782 Mixed hyperlipidemia: Secondary | ICD-10-CM | POA: Diagnosis not present

## 2019-09-28 DIAGNOSIS — M810 Age-related osteoporosis without current pathological fracture: Secondary | ICD-10-CM | POA: Diagnosis not present

## 2019-09-28 DIAGNOSIS — R7303 Prediabetes: Secondary | ICD-10-CM | POA: Diagnosis not present

## 2019-09-28 DIAGNOSIS — F419 Anxiety disorder, unspecified: Secondary | ICD-10-CM | POA: Diagnosis not present

## 2019-10-22 DIAGNOSIS — H524 Presbyopia: Secondary | ICD-10-CM | POA: Diagnosis not present

## 2019-10-23 DIAGNOSIS — M85851 Other specified disorders of bone density and structure, right thigh: Secondary | ICD-10-CM | POA: Diagnosis not present

## 2019-10-23 DIAGNOSIS — M85852 Other specified disorders of bone density and structure, left thigh: Secondary | ICD-10-CM | POA: Diagnosis not present

## 2019-10-23 DIAGNOSIS — M810 Age-related osteoporosis without current pathological fracture: Secondary | ICD-10-CM | POA: Diagnosis not present

## 2019-11-04 DIAGNOSIS — S62651A Nondisplaced fracture of medial phalanx of left index finger, initial encounter for closed fracture: Secondary | ICD-10-CM | POA: Diagnosis not present

## 2019-11-04 DIAGNOSIS — M79645 Pain in left finger(s): Secondary | ICD-10-CM | POA: Diagnosis not present

## 2019-11-10 DIAGNOSIS — M810 Age-related osteoporosis without current pathological fracture: Secondary | ICD-10-CM | POA: Diagnosis not present

## 2019-11-10 DIAGNOSIS — I4891 Unspecified atrial fibrillation: Secondary | ICD-10-CM | POA: Diagnosis not present

## 2019-11-10 DIAGNOSIS — E782 Mixed hyperlipidemia: Secondary | ICD-10-CM | POA: Diagnosis not present

## 2019-11-11 DIAGNOSIS — M79645 Pain in left finger(s): Secondary | ICD-10-CM | POA: Diagnosis not present

## 2019-11-12 DIAGNOSIS — Z23 Encounter for immunization: Secondary | ICD-10-CM | POA: Diagnosis not present

## 2019-11-12 DIAGNOSIS — H6121 Impacted cerumen, right ear: Secondary | ICD-10-CM | POA: Diagnosis not present

## 2019-11-18 DIAGNOSIS — M79645 Pain in left finger(s): Secondary | ICD-10-CM | POA: Diagnosis not present

## 2019-11-18 DIAGNOSIS — R102 Pelvic and perineal pain: Secondary | ICD-10-CM | POA: Diagnosis not present

## 2019-11-18 DIAGNOSIS — M546 Pain in thoracic spine: Secondary | ICD-10-CM | POA: Diagnosis not present

## 2019-12-02 DIAGNOSIS — M545 Low back pain: Secondary | ICD-10-CM | POA: Diagnosis not present

## 2019-12-02 DIAGNOSIS — M79645 Pain in left finger(s): Secondary | ICD-10-CM | POA: Diagnosis not present

## 2019-12-15 DIAGNOSIS — M79645 Pain in left finger(s): Secondary | ICD-10-CM | POA: Diagnosis not present

## 2019-12-31 DIAGNOSIS — K219 Gastro-esophageal reflux disease without esophagitis: Secondary | ICD-10-CM | POA: Diagnosis not present

## 2019-12-31 DIAGNOSIS — I4891 Unspecified atrial fibrillation: Secondary | ICD-10-CM | POA: Diagnosis not present

## 2019-12-31 DIAGNOSIS — R0600 Dyspnea, unspecified: Secondary | ICD-10-CM | POA: Diagnosis not present

## 2019-12-31 DIAGNOSIS — M810 Age-related osteoporosis without current pathological fracture: Secondary | ICD-10-CM | POA: Diagnosis not present

## 2019-12-31 DIAGNOSIS — I5032 Chronic diastolic (congestive) heart failure: Secondary | ICD-10-CM | POA: Diagnosis not present

## 2019-12-31 DIAGNOSIS — E782 Mixed hyperlipidemia: Secondary | ICD-10-CM | POA: Diagnosis not present

## 2020-01-05 DIAGNOSIS — L820 Inflamed seborrheic keratosis: Secondary | ICD-10-CM | POA: Diagnosis not present

## 2020-01-05 DIAGNOSIS — B351 Tinea unguium: Secondary | ICD-10-CM | POA: Diagnosis not present

## 2020-01-05 DIAGNOSIS — M7130 Other bursal cyst, unspecified site: Secondary | ICD-10-CM | POA: Diagnosis not present

## 2020-01-31 NOTE — Progress Notes (Signed)
Cardiology Office Note   Date:  02/02/2020   ID:  Ann Rodriguez, DOB 09/29/1928, MRN [id number]  PCP:  Tally Joe, MD  Cardiologist: Dr. Verdis Prime, MD  No chief complaint on file.     History of Present Illness: Ann Rodriguez is a 86 y.o. female who presents for follow-up, seen by Dr. Katrinka Blazing.  Ms. Bloodsaw has a history of a SVT s/p ablation 07/14/14, possible PAF remote without documentation on EKG, hypertension and diastolic dysfunction.  She was previously on flecainide and anticoagulation however this was discontinued after SVT ablation.  She was last seen by Dr. Katrinka Blazing 05/05/2019 for increased shortness of breath on exertion for several months.  She had no resting dyspnea or LE edema.  Dyspnea felt to be multifactorial including diastolic dysfunction, physical deconditioning and kyphoscoliosis with restrictive pulmonary function.  Recommendations were to increase her physical activity with hopeful return to exertional tolerance.  Today she returns for follow-up and reports her DOE has been unchanged over the last several months.  She states she was previously attending water aerobics multiple times a week, able to clean her house and continue other physical activity without issues.  Unfortunately, she states approximately 4 to 5 months ago she began having dyspnea on exertion without chest pain, LE edema, orthopnea, palpitations or dizziness.  As above, she has attempted to increase her activity however this is limited by her DOE.  She has seen her PCP on several occasions with no real explanation.  Given this, we discussed repeating her echocardiogram given length of time since last study in 14-Jul-2014 which showed a normal LVEF mild MR, moderate TR with G1 DD.  She denies cough or wheezing.  Has a remote history of tobacco use in her 7s.   Past Medical History:  Diagnosis Date  . Arthritis   . Depression    PT'S HUSBAND AND SON HAVE DIED W/IN PAST YEAR Jul 14, 2011  . GERD (gastroesophageal  reflux disease)   . Hearing loss   . Hyperlipidemia   . Idiopathic peripheral neuropathy 07/18/2016  . Nocturnal leg cramps 01/22/2017  . Stroke (HCC)   . SVT (supraventricular tachycardia) (HCC) 09/2014  . Thyroid disease     Past Surgical History:  Procedure Laterality Date  . APPENDECTOMY    . BACK SURGERY     lumbar  . BLADDER SURGERY    . BUNIONECTOMY     left and right with hammer toe repair  . CARPAL TUNNEL RELEASE Bilateral Jul 14, 2015  . ELECTROPHYSIOLOGIC STUDY N/A 11/03/2014   Procedure: SVT Ablation;  Surgeon: Marinus Maw, MD;  [facility]: [facility] INVASIVE CV LAB;  Service: Cardiovascular;  Laterality: N/A;  . EYE SURGERY     bilateral cataract extraction with IOL  . FOOT SURGERY    . KNEE ARTHROSCOPY  02/09/2011   Procedure: ARTHROSCOPY KNEE;  Surgeon: Jacki Cones;  Location: [facility];  Service: Orthopedics;  Laterality: Left;  Left Knee Arthroscopy with Menisectomy medial, abrasion chondroplasty medial, and synovectomy, supra patella pouch.  Marland Kitchen NASAL SINUS SURGERY    . OOPHORECTOMY    . ROTATOR CUFF REPAIR  1/13,  3/13   x 3  right/  . SALPINGECTOMY    . SHOULDER OPEN ROTATOR CUFF REPAIR Left 06/26/2012   Procedure: LEFT SHOULDER ROTATOR CUFF REPAIR WITH ANCHORS ;  Surgeon: Jacki Cones, MD;  Location: [facility];  Service: Orthopedics;  Laterality: Left;  . TOTAL KNEE ARTHROPLASTY  09/05/2011   Procedure: TOTAL KNEE ARTHROPLASTY;  Surgeon: Jacki Cones, MD;  Location: [facility];  Service: Orthopedics;  Laterality: Left;     Current Outpatient Medications  Medication Sig Dispense Refill  . aspirin EC 81 MG tablet Take 81 mg by mouth daily.    Marland Kitchen atorvastatin (LIPITOR) 40 MG tablet Take 40 mg by mouth every evening.     . beta carotene w/minerals (OCUVITE) tablet Take 1 tablet by mouth daily with breakfast.     . BIOTIN PO Take 1 tablet by mouth daily.    . calcium carbonate (OS-CAL) 600 MG TABS Take 1,800 mg by mouth daily with breakfast.     . cholecalciferol (VITAMIN D)  1000 UNITS tablet Take 1,000 Units by mouth daily.     Marland Kitchen escitalopram (LEXAPRO) 5 MG tablet Take 5 mg by mouth daily.  0  . esomeprazole (NEXIUM) 20 MG capsule Take 20 mg by mouth every morning.     . polyethylene glycol (MIRALAX / GLYCOLAX) packet Take 17 g by mouth every other day.     . vitamin B-12 (CYANOCOBALAMIN) 100 MCG tablet Take 100 mcg by mouth daily.     No current facility-administered medications for this visit.    Allergies:   Patient has no known allergies.    Social History:  The patient  reports that she quit smoking about 46 years ago. Her smoking use included cigarettes. She smoked 0.00 packs per day for 0.00 years. She has never used smokeless tobacco. She reports current alcohol use of about 1.0 standard drink of alcohol per week. She reports that she does not use drugs.   Family History:  The patient's family history includes Diabetes in her mother and son.    ROS:  Please see the history of present illness.   Otherwise, review of systems are positive for none.  All other systems are reviewed and negative.    PHYSICAL EXAM: VS:  BP (!) 142/80   Pulse 64   Ht 5\' 6"  (1.676 m)   Wt 140 lb 9.6 oz (63.8 kg)   SpO2 93%   BMI 22.69 kg/m  , BMI Body mass index is 22.69 kg/m.  General: Well developed, well nourished, NAD Skin: Warm, dry, intact  Lungs: Diminished in left upper and lowr lobes due to kyphosis. No wheezes, rales, or rhonchi. Breathing is unlabored. Cardiovascular: RRR with S1 S2. No murmurs, rubs, gallops, or LV heave appreciated. Extremities: No edema. Radial pulses 2+ bilaterally Neuro: Alert and oriented. No focal deficits. No facial asymmetry. MAE spontaneously. Psych: Responds to questions appropriately with normal affect.     EKG:  EKG is not ordered today.  Recent Labs: No results found for requested labs within last 8760 hours.    Lipid Panel    Component Value Date/Time   CHOL 161 11/03/2014 0450   TRIG 130 11/03/2014 0450   HDL  47 11/03/2014 0450   CHOLHDL 3.4 11/03/2014 0450   VLDL 26 11/03/2014 0450   LDLCALC 88 11/03/2014 0450    Wt Readings from Last 3 Encounters:  02/02/20 140 lb 9.6 oz (63.8 kg)  09/23/19 128 lb 12.8 oz (58.4 kg)  07/20/19 142 lb 13.7 oz (64.8 kg)    Other studies Reviewed: Additional studies/ records that were reviewed today include:  Review of the above records demonstrates:   Echocardiogram 09/29/2014:  - Left ventricle: The cavity size was normal. Systolic function was  normal. The estimated ejection fraction was in the range of 55%  to 60%. Wall motion was normal; there  were no regional wall  motion abnormalities. Doppler parameters are consistent with  abnormal left ventricular relaxation (grade 1 diastolic  dysfunction).  - Aortic valve: There was mild regurgitation.  - Mitral valve: Structurally normal valve. There was mild  regurgitation.  - Left atrium: The atrium was mildly dilated.  - Right ventricle: The cavity size was mildly dilated. Wall  thickness was normal.  - Right atrium: The atrium was mildly dilated.  - Tricuspid valve: There was moderate regurgitation.  - Pulmonary arteries: Systolic pressure was within the normal  range. PA peak pressure: 34 mm Hg (S).  - Inferior vena cava: The vessel was dilated. The respirophasic  diameter changes were blunted (< 50%), consistent with elevated  central venous pressure.  - Pericardium, extracardiac: A trivial pericardial effusion was  identified. Features were not consistent with tamponade  physiology.   ASSESSMENT AND PLAN:  1.  Shortness of breath/DOE with history of chronic diastolic CHF: -Last echocardiogram in 2016 with the EF, G1 DD, MR and moderate TR.  She has been seen on several occasions by Dr. Katrinka Blazing and PCP however continues to have persistent symptoms.  Is very active at baseline, previously attending water aerobics on several days per week, lives alone, cleans her own house  however these have been limited due to shortness of breath over the last several months. -Given her last echocardiogram was in 2016 with the above findings, will repeat study at this time to evaluate for cardiac etiology.  If echocardiogram is normal, may consider referral to pulmonology for lung capacity testing. -She has no evidence of fluid volume overload on lung or lower extremity exam.  Denies chest pain or other anginal symptoms.  2.  SVT s/p ablation: -Denies palpitations   Current medicines are reviewed at length with the patient today.  The patient does not have concerns regarding medicines.  The following changes have been made:  no change  Labs/ tests ordered today include: Echocardiogram  Orders Placed This Encounter  Procedures  . ECHOCARDIOGRAM COMPLETE    Disposition:   FU with myself or Dr. Katrinka Blazing in 3 weeks  Signed, Georgie Chard, NP  02/02/2020 3:12 PM    [facility] [address] [place], [place], [place]  [address] Phone: [phone]; Fax: [phone]

## 2020-02-02 ENCOUNTER — Other Ambulatory Visit: Payer: Self-pay

## 2020-02-02 ENCOUNTER — Encounter: Payer: Self-pay | Admitting: Cardiology

## 2020-02-02 ENCOUNTER — Ambulatory Visit: Payer: Medicare Other | Admitting: Cardiology

## 2020-02-02 VITALS — BP 142/80 | HR 64 | Ht 66.0 in | Wt 140.6 lb

## 2020-02-02 DIAGNOSIS — R0600 Dyspnea, unspecified: Secondary | ICD-10-CM

## 2020-02-02 DIAGNOSIS — I471 Supraventricular tachycardia: Secondary | ICD-10-CM

## 2020-02-02 DIAGNOSIS — R0609 Other forms of dyspnea: Secondary | ICD-10-CM

## 2020-02-02 DIAGNOSIS — I5032 Chronic diastolic (congestive) heart failure: Secondary | ICD-10-CM | POA: Diagnosis not present

## 2020-02-02 DIAGNOSIS — R0602 Shortness of breath: Secondary | ICD-10-CM

## 2020-02-02 DIAGNOSIS — E782 Mixed hyperlipidemia: Secondary | ICD-10-CM | POA: Diagnosis not present

## 2020-02-02 DIAGNOSIS — I4891 Unspecified atrial fibrillation: Secondary | ICD-10-CM | POA: Diagnosis not present

## 2020-02-02 DIAGNOSIS — M810 Age-related osteoporosis without current pathological fracture: Secondary | ICD-10-CM | POA: Diagnosis not present

## 2020-02-02 DIAGNOSIS — K219 Gastro-esophageal reflux disease without esophagitis: Secondary | ICD-10-CM | POA: Diagnosis not present

## 2020-02-02 NOTE — Patient Instructions (Signed)
Medication Instructions:  Your physician recommends that you continue on your current medications as directed. Please refer to the Current Medication list given to you today.  *If you need a refill on your cardiac medications before your next appointment, please call your pharmacy*   Lab Work: NONE If you have labs (blood work) drawn today and your tests are completely normal, you will receive your results only by: Marland Kitchen MyChart Message (if you have MyChart) OR . A paper copy in the mail If you have any lab test that is abnormal or we need to change your treatment, we will call you to review the results.   Testing/Procedures: Your physician has requested that you have an echocardiogram. Echocardiography is a painless test that uses sound waves to create images of your heart. It provides your doctor with information about the size and shape of your heart and how well your heart's chambers and valves are working. This procedure takes approximately one hour. There are no restrictions for this procedure.   Follow-Up: At [facility], you and your health needs are our priority.  As part of our continuing mission to provide you with exceptional heart care, we have created designated Provider Care Teams.  These Care Teams include your primary Cardiologist (physician) and Advanced Practice Providers (APPs -  Physician Assistants and Nurse Practitioners) who all work together to provide you with the care you need, when you need it.  We recommend signing up for the patient portal called "MyChart".  Sign up information is provided on this After Visit Summary.  MyChart is used to connect with patients for Virtual Visits (Telemedicine).  Patients are able to view lab/test results, encounter notes, upcoming appointments, etc.  Non-urgent messages can be sent to your provider as well.   To learn more about what you can do with MyChart, go to [email or web address].    Your next appointment:   2-3 weeks  after the Echo is complete  The format for your next appointment:   In Person  Provider:    You may see Lesleigh Noe, MD or Georgie Chard, NP    Other Instructions  Echocardiogram An echocardiogram is a procedure that uses painless sound waves (ultrasound) to produce an image of the heart. Images from an echocardiogram can provide important information about:  Signs of coronary artery disease (CAD).  Aneurysm detection. An aneurysm is a weak or damaged part of an artery wall that bulges out from the normal force of blood pumping through the body.  Heart size and shape. Changes in the size or shape of the heart can be associated with certain conditions, including heart failure, aneurysm, and CAD.  Heart muscle function.  Heart valve function.  Signs of a past heart attack.  Fluid buildup around the heart.  Thickening of the heart muscle.  A tumor or infectious growth around the heart valves. Tell a health care provider about:  Any allergies you have.  All medicines you are taking, including vitamins, herbs, eye drops, creams, and over-the-counter medicines.  Any blood disorders you have.  Any surgeries you have had.  Any medical conditions you have.  Whether you are pregnant or may be pregnant. What are the risks? Generally, this is a safe procedure. However, problems may occur, including:  Allergic reaction to dye (contrast) that may be used during the procedure. What happens before the procedure? No specific preparation is needed. You may eat and drink normally. What happens during the procedure?   An IV  tube may be inserted into one of your veins.  You may receive contrast through this tube. A contrast is an injection that improves the quality of the pictures from your heart.  A gel will be applied to your chest.  A wand-like tool (transducer) will be moved over your chest. The gel will help to transmit the sound waves from the transducer.  The  sound waves will harmlessly bounce off of your heart to allow the heart images to be captured in real-time motion. The images will be recorded on a computer. The procedure may vary among health care providers and hospitals. What happens after the procedure?  You may return to your normal, everyday life, including diet, activities, and medicines, unless your health care provider tells you not to do that. Summary  An echocardiogram is a procedure that uses painless sound waves (ultrasound) to produce an image of the heart.  Images from an echocardiogram can provide important information about the size and shape of your heart, heart muscle function, heart valve function, and fluid buildup around your heart.  You do not need to do anything to prepare before this procedure. You may eat and drink normally.  After the echocardiogram is completed, you may return to your normal, everyday life, unless your health care provider tells you not to do that. This information is not intended to replace advice given to you by your health care provider. Make sure you discuss any questions you have with your health care provider. Document Revised: 06/19/2018 Document Reviewed: 03/31/2016 Elsevier Patient Education  2020 [employer].

## 2020-02-23 ENCOUNTER — Ambulatory Visit: Payer: Medicare Other | Admitting: Interventional Cardiology

## 2020-02-26 ENCOUNTER — Other Ambulatory Visit: Payer: Self-pay

## 2020-02-26 ENCOUNTER — Ambulatory Visit (HOSPITAL_COMMUNITY): Payer: Medicare Other | Attending: Internal Medicine

## 2020-02-26 DIAGNOSIS — R0602 Shortness of breath: Secondary | ICD-10-CM | POA: Insufficient documentation

## 2020-02-27 LAB — ECHOCARDIOGRAM COMPLETE
Area-P 1/2: 4.06 cm2
P 1/2 time: 481 msec
S' Lateral: 3.5 cm

## 2020-03-09 ENCOUNTER — Telehealth: Payer: Self-pay

## 2020-03-09 DIAGNOSIS — R0602 Shortness of breath: Secondary | ICD-10-CM

## 2020-03-09 NOTE — Telephone Encounter (Signed)
Reviewed results with patient who verbalized understanding. Per Georgie Chard, NP recommendations would like pt to see a pulmonologist for lung test. Pt states that she is willing to see a pulmonologist and pt thanked me for the call.

## 2020-03-09 NOTE — Telephone Encounter (Signed)
-----   Message from Filbert Schilder, NP sent at 03/01/2020  9:26 AM EST ----- Please let the patient know that her echo looks good. Her squeeze function is strong. There is some stiffening of the heart that was present on last study and is fairly normal with aging. Her valves look essentially the same as last study. No real explanation of her SOB. Would recommend that she see pulmonary as we discussed for lung testing

## 2020-03-13 NOTE — Progress Notes (Deleted)
Cardiology Office Note:    Date:  03/13/2020   ID:  Ann Rodriguez, DOB 07-30-1928, MRN [id number]  PCP:  Tally Joe, MD  Cardiologist:  Lesleigh Noe, MD   Referring MD: Tally Joe, MD   No chief complaint on file.   History of Present Illness:    Ann Rodriguez is a 87 y.o. female with a hx of SVT s/p ablation, possible PAF(remote) without documentation on ECG, hypertension, and disastolic dysfunction. After SVT ablation, flecainide and anticoagulation was discontinued.  ***  Past Medical History:  Diagnosis Date  . Arthritis   . Depression    PT'S HUSBAND AND SON HAVE DIED W/IN PAST YEAR 06/25/2011  . GERD (gastroesophageal reflux disease)   . Hearing loss   . Hyperlipidemia   . Idiopathic peripheral neuropathy 07/18/2016  . Nocturnal leg cramps 01/22/2017  . Stroke (HCC)   . SVT (supraventricular tachycardia) (HCC) 09/2014  . Thyroid disease     Past Surgical History:  Procedure Laterality Date  . APPENDECTOMY    . BACK SURGERY     lumbar  . BLADDER SURGERY    . BUNIONECTOMY     left and right with hammer toe repair  . CARPAL TUNNEL RELEASE Bilateral 06-25-15  . ELECTROPHYSIOLOGIC STUDY N/A 11/03/2014   Procedure: SVT Ablation;  Surgeon: Marinus Maw, MD;  Location: [facility] INVASIVE CV LAB;  Service: Cardiovascular;  Laterality: N/A;  . EYE SURGERY     bilateral cataract extraction with IOL  . FOOT SURGERY    . KNEE ARTHROSCOPY  02/09/2011   Procedure: ARTHROSCOPY KNEE;  Surgeon: Jacki Cones;  Location: [facility];  Service: Orthopedics;  Laterality: Left;  Left Knee Arthroscopy with Menisectomy medial, abrasion chondroplasty medial, and synovectomy, supra patella pouch.  Marland Kitchen NASAL SINUS SURGERY    . OOPHORECTOMY    . ROTATOR CUFF REPAIR  1/13,  3/13   x 3  right/  . SALPINGECTOMY    . SHOULDER OPEN ROTATOR CUFF REPAIR Left 06/26/2012   Procedure: LEFT SHOULDER ROTATOR CUFF REPAIR WITH ANCHORS ;  Surgeon: Jacki Cones, MD;  Location: [facility];  Service: Orthopedics;   Laterality: Left;  . TOTAL KNEE ARTHROPLASTY  09/05/2011   Procedure: TOTAL KNEE ARTHROPLASTY;  Surgeon: Jacki Cones, MD;  Location: [facility];  Service: Orthopedics;  Laterality: Left;    Current Medications: No outpatient medications have been marked as taking for the 03/17/20 encounter (Appointment) with Lyn Records, MD.     Allergies:   Patient has no known allergies.   Social History   Socioeconomic History  . Marital status: Widowed    Spouse name: Not on file  . Number of children: 2  . Years of education: Not on file  . Highest education level: Not on file  Occupational History  . Occupation: Retired  Tobacco Use  . Smoking status: Former Smoker    Packs/day: 0.00    Years: 0.00    Pack years: 0.00    Types: Cigarettes    Quit date: 02/04/1974    Years since quitting: 46.1  . Smokeless tobacco: Never Used  Substance and Sexual Activity  . Alcohol use: Yes    Alcohol/week: 1.0 standard drink    Types: 1 Glasses of wine per week    Comment: socially  . Drug use: No  . Sexual activity: Not on file  Other Topics Concern  . Not on file  Social History Narrative   Lives alone   Caffeine  use: 4 drinks coffee per day   Right-handed   Social Determinants of Health   Financial Resource Strain: Not on file  Food Insecurity: Not on file  Transportation Needs: Not on file  Physical Activity: Not on file  Stress: Not on file  Social Connections: Not on file     Family History: The patient's family history includes Diabetes in her mother and son.  ROS:   Please see the history of present illness.    *** All other systems reviewed and are negative.  EKGs/Labs/Other Studies Reviewed:    The following studies were reviewed today: ***  EKG:  EKG ***  Recent Labs: No results found for requested labs within last 8760 hours.  Recent Lipid Panel    Component Value Date/Time   CHOL 161 11/03/2014 0450   TRIG 130 11/03/2014 0450   HDL 47 11/03/2014 0450    CHOLHDL 3.4 11/03/2014 0450   VLDL 26 11/03/2014 0450   LDLCALC 88 11/03/2014 0450    Physical Exam:    VS:  There were no vitals taken for this visit.    Wt Readings from Last 3 Encounters:  02/02/20 140 lb 9.6 oz (63.8 kg)  09/23/19 128 lb 12.8 oz (58.4 kg)  07/20/19 142 lb 13.7 oz (64.8 kg)     GEN: ***. No acute distress HEENT: Normal NECK: No JVD. LYMPHATICS: No lymphadenopathy CARDIAC: *** murmur. RRR *** gallop, or edema. VASCULAR: *** Normal Pulses. No bruits. RESPIRATORY:  Clear to auscultation without rales, wheezing or rhonchi  ABDOMEN: Soft, non-tender, non-distended, No pulsatile mass, MUSCULOSKELETAL: No deformity  SKIN: Warm and dry NEUROLOGIC:  Alert and oriented x 3 PSYCHIATRIC:  Normal affect   ASSESSMENT:    1. Chronic diastolic heart failure (HCC)   2. Paroxysmal atrial fibrillation (HCC)   3. Chronic anticoagulation   4. Dyspnea on exertion   5. History of cardiac radiofrequency ablation (RFA)   6. Educated about COVID-19 virus infection    PLAN:    In order of problems listed above:  1. ***   Medication Adjustments/Labs and Tests Ordered: Current medicines are reviewed at length with the patient today.  Concerns regarding medicines are outlined above.  No orders of the defined types were placed in this encounter.  No orders of the defined types were placed in this encounter.   There are no Patient Instructions on file for this visit.   Signed, Lesleigh Noe, MD  03/13/2020 5:45 PM    [facility]

## 2020-03-15 ENCOUNTER — Telehealth: Payer: Self-pay | Admitting: Interventional Cardiology

## 2020-03-15 NOTE — Telephone Encounter (Signed)
New Message:     Pt said she received her Echo results. She wants to know if she still need her appt with Dr Ann Rodriguez on 03-17-20?

## 2020-03-16 NOTE — Telephone Encounter (Signed)
Spoke with pt and advised, based off of echo results, as long as she is feeling ok, we can push her appt out with Dr. Katrinka Blazing.  Pt seeing pulmonology on January 25th.  Pt agreeable to plan to see Dr. Katrinka Blazing at the one year mark from her last appt. Advised to call sooner if any issues.  Pt appreciative for call.

## 2020-03-17 ENCOUNTER — Ambulatory Visit: Payer: Medicare Other | Admitting: Interventional Cardiology

## 2020-03-17 DIAGNOSIS — Z9889 Other specified postprocedural states: Secondary | ICD-10-CM

## 2020-03-17 DIAGNOSIS — I480 Paroxysmal atrial fibrillation: Secondary | ICD-10-CM

## 2020-03-17 DIAGNOSIS — Z7901 Long term (current) use of anticoagulants: Secondary | ICD-10-CM

## 2020-03-17 DIAGNOSIS — I5032 Chronic diastolic (congestive) heart failure: Secondary | ICD-10-CM

## 2020-03-17 DIAGNOSIS — R0600 Dyspnea, unspecified: Secondary | ICD-10-CM

## 2020-03-17 DIAGNOSIS — Z7189 Other specified counseling: Secondary | ICD-10-CM

## 2020-03-31 DIAGNOSIS — M810 Age-related osteoporosis without current pathological fracture: Secondary | ICD-10-CM | POA: Diagnosis not present

## 2020-03-31 DIAGNOSIS — R7309 Other abnormal glucose: Secondary | ICD-10-CM | POA: Diagnosis not present

## 2020-03-31 DIAGNOSIS — F419 Anxiety disorder, unspecified: Secondary | ICD-10-CM | POA: Diagnosis not present

## 2020-03-31 DIAGNOSIS — E782 Mixed hyperlipidemia: Secondary | ICD-10-CM | POA: Diagnosis not present

## 2020-04-05 ENCOUNTER — Other Ambulatory Visit: Payer: Self-pay

## 2020-04-05 ENCOUNTER — Ambulatory Visit: Payer: Medicare Other | Admitting: Pulmonary Disease

## 2020-04-05 ENCOUNTER — Encounter: Payer: Self-pay | Admitting: Pulmonary Disease

## 2020-04-05 VITALS — BP 122/70 | HR 89 | Temp 97.6°F | Ht 65.0 in | Wt 140.4 lb

## 2020-04-05 DIAGNOSIS — R0602 Shortness of breath: Secondary | ICD-10-CM | POA: Diagnosis not present

## 2020-04-05 NOTE — Progress Notes (Signed)
Subjective:   PATIENT ID: Ann Rodriguez GENDER: female DOB: 1928-11-07, MRN: [id number]   HPI  Chief Complaint  Patient presents with  . Consult    [facility] with exertion.  Sneezing a lot.  Clears her throat a lot.  Sometimes she feels winded when talking to people.     Reason for Visit: New consult for shortness of breath  Ms. Ann Rodriguez is a 87 year old female with kyphoscoliosis, SVT status post ablation 2004-07-11, hx TIA, HLD, prior sinus surgery who presents for shortness of breath.  She presents for longstanding shortness of breath for over a year. Worsens with activity or when talking to people. Improves with rest. Denies associated chest pain, cough, wheezing or sputum production. Reports her symptoms have limited her ability to clean the house like she used to. She has been evaluated by her PCP and Cardiology and work-up has revealed chronic diastolic heart failure otherwise negative for etiology. She previously was very active participating in water aerobics and notes that in the last two years she has had minimal activity. She has recently started participating in yoga with her family and even with the modified movements, this has started to re-energize her and improving her breathing.  Social History: Social smoker. Quit in 07/12/1958. Possibly 3 pack-years at most  I have personally reviewed patient's past medical/family/social history, allergies, current medications.  Past Medical History:  Diagnosis Date  . Arthritis   . Depression    PT'S HUSBAND AND SON HAVE DIED W/IN PAST YEAR 2011/07/12  . GERD (gastroesophageal reflux disease)   . Hearing loss   . Hyperlipidemia   . Idiopathic peripheral neuropathy 07/18/2016  . Nocturnal leg cramps 01/22/2017  . Stroke (HCC)   . SVT (supraventricular tachycardia) (HCC) 09/2014  . Thyroid disease      Family History  Problem Relation Age of Onset  . Diabetes Mother   . Diabetes Son      Social History   Occupational History  .  Occupation: Retired  Tobacco Use  . Smoking status: Former Smoker    Packs/day: 0.00    Years: 0.00    Pack years: 0.00    Types: Cigarettes    Quit date: 02/04/1974    Years since quitting: 46.1  . Smokeless tobacco: Never Used  Substance and Sexual Activity  . Alcohol use: Yes    Alcohol/week: 1.0 standard drink    Types: 1 Glasses of wine per week    Comment: socially  . Drug use: No  . Sexual activity: Not on file    No Known Allergies   Outpatient Medications Prior to Visit  Medication Sig Dispense Refill  . aspirin EC 81 MG tablet Take 81 mg by mouth daily.    Marland Kitchen atorvastatin (LIPITOR) 40 MG tablet Take 40 mg by mouth every evening.     . beta carotene w/minerals (OCUVITE) tablet Take 1 tablet by mouth daily with breakfast.    . BIOTIN PO Take 1 tablet by mouth daily.    . calcium carbonate (OS-CAL) 600 MG TABS Take 1,800 mg by mouth daily with breakfast.    . cholecalciferol (VITAMIN D) 1000 UNITS tablet Take 1,000 Units by mouth daily.    Marland Kitchen escitalopram (LEXAPRO) 5 MG tablet Take 5 mg by mouth daily.  0  . esomeprazole (NEXIUM) 20 MG capsule Take 20 mg by mouth every morning.     . meclizine (ANTIVERT) 25 MG tablet 1 tablet    . polyethylene glycol (MIRALAX / GLYCOLAX)  packet Take 17 g by mouth every other day.    . vitamin B-12 (CYANOCOBALAMIN) 100 MCG tablet Take 100 mcg by mouth daily.     No facility-administered medications prior to visit.    Review of Systems  Constitutional: Negative for chills, diaphoresis, fever, malaise/fatigue and weight loss.  HENT: Negative for congestion, ear pain and sore throat.   Respiratory: Positive for shortness of breath. Negative for cough, hemoptysis, sputum production and wheezing.   Cardiovascular: Negative for chest pain, palpitations and leg swelling.  Gastrointestinal: Positive for heartburn. Negative for abdominal pain and nausea.  Genitourinary: Negative for frequency.  Musculoskeletal: Negative for joint pain and  myalgias.  Skin: Negative for itching and rash.  Neurological: Negative for dizziness, weakness and headaches.  Endo/Heme/Allergies: Does not bruise/bleed easily.  Psychiatric/Behavioral: Negative for depression. The patient is nervous/anxious.      Objective:   Vitals:   04/05/20 0905  BP: 122/70  Pulse: 89  Temp: 97.6 F (36.4 C)  TempSrc: Tympanic  SpO2: 97%  Weight: 140 lb 6 oz (63.7 kg)  Height: 5\' 5"  (1.651 m)   SpO2: 97 %  Physical Exam: General: Well-appearing, no acute distress HENT: [place], AT Eyes: EOMI, no scleral icterus Respiratory: Clear to auscultation bilaterally.  No crackles, wheezing or rales Cardiovascular: RRR, -M/R/G, no JVD Extremities:-Edema,-tenderness Neuro: AAO x4, CNII-XII grossly intact Skin: Intact, no rashes or bruising Psych: Normal mood, normal affect  Data Reviewed:  Imaging: CTA 02/18/13 - No PE. Background emphysema CXR Ribs 07/25/19 Remote left rib fractures 5-7. Subacute left 10th rib fracture.   PFT: 10/28/14 FVC 2.81 (109%) FEV1 2.2 (116%) Ratio 70  TLC 95% DLCO 75% Interpretation: Mild obstructive defect with mildly reduced DLCO  Labs: CBC    Component Value Date/Time   WBC 8.2 03/01/2017 1105   RBC 4.18 03/01/2017 1105   HGB 13.0 03/01/2017 1105   HCT 39.2 03/01/2017 1105   PLT 250 03/01/2017 1105   MCV 93.8 03/01/2017 1105   MCH 31.1 03/01/2017 1105   MCHC 33.2 03/01/2017 1105   RDW 13.2 03/01/2017 1105   LYMPHSABS 0.8 03/01/2017 1105   MONOABS 0.7 03/01/2017 1105   EOSABS 0.1 03/01/2017 1105   BASOSABS 0.0 03/01/2017 1105   Imaging, labs and test noted above have been reviewed independently by me.    Assessment & Plan:   Discussion:  Shortness of breath Kyphoscoliosis Remote rib fractures Deconditioning  87 year old female with remote smoking history who presents with progressive shortness of breath >2 years who presents for evaluation. Prior PFTs demonstrate obstructive defect and CTA in 2014 commenting  on emphysema. She also has mild kyphoscoliosis and hx of rib fractures that may contributing to a restrictive defect. In addition to this, patient's clinical history suggests deconditioning. We discussed the possibility of all these factors contributing to her dyspnea and after discussion agreed to pursue testing with pulmonary function test. She will likely benefit from a bronchodilator in the future.  --We will obtain PFTs to evaluate your breathing  Health Maintenance Immunization History  Administered Date(s) Administered  . Influenza Split 11/20/2007  . Influenza, High Dose Seasonal PF 11/24/2009, 11/06/2010, 12/10/2011, 11/17/2012, 12/10/2013, 12/03/2014, 12/12/2015, 11/01/2016, 12/12/2017, 11/20/2018, 11/12/2019  . PFIZER(Purple Top)SARS-COV-2 Vaccination 04/02/2019, 04/23/2019  . Pneumococcal Conjugate-13 05/10/2014  . Pneumococcal Polysaccharide-23 03/17/2009   CT Lung Screen - not indicated  No orders of the defined types were placed in this encounter. No orders of the defined types were placed in this encounter.   No follow-ups  on file.  I have spent a total time of 45-minutes on the day of the appointment reviewing prior documentation, coordinating care and discussing medical diagnosis and plan with the patient/family. Imaging, labs and tests included in this note have been reviewed and interpreted independently by me.  Gregrey Bloyd Mechele Collin, MD [facility] 04/05/2020 9:11 AM  Office Number [phone]

## 2020-04-05 NOTE — Patient Instructions (Signed)
Shortness of breath Kyphoscoliosis Deconditioning --We will obtain PFTs to evaluate your breathing

## 2020-04-07 ENCOUNTER — Encounter: Payer: Self-pay | Admitting: Pulmonary Disease

## 2020-04-12 DIAGNOSIS — R0781 Pleurodynia: Secondary | ICD-10-CM | POA: Diagnosis not present

## 2020-04-18 ENCOUNTER — Other Ambulatory Visit (HOSPITAL_COMMUNITY)
Admission: RE | Admit: 2020-04-18 | Discharge: 2020-04-18 | Disposition: A | Discharge status: Home / Self Care | Payer: Medicare Other | Source: Ambulatory Visit | Attending: Pulmonary Disease | Admitting: Pulmonary Disease

## 2020-04-18 DIAGNOSIS — Z20822 Contact with and (suspected) exposure to covid-19: Secondary | ICD-10-CM | POA: Diagnosis not present

## 2020-04-18 DIAGNOSIS — Z01812 Encounter for preprocedural laboratory examination: Secondary | ICD-10-CM | POA: Diagnosis not present

## 2020-04-18 LAB — SARS CORONAVIRUS 2 (TAT 6-24 HRS): SARS Coronavirus 2: NEGATIVE

## 2020-04-20 ENCOUNTER — Other Ambulatory Visit: Payer: Self-pay | Admitting: *Deleted

## 2020-04-20 DIAGNOSIS — R0602 Shortness of breath: Secondary | ICD-10-CM

## 2020-04-21 ENCOUNTER — Ambulatory Visit: Payer: Medicare Other | Admitting: Pulmonary Disease

## 2020-04-21 ENCOUNTER — Other Ambulatory Visit: Payer: Self-pay

## 2020-04-21 ENCOUNTER — Encounter: Payer: Self-pay | Admitting: Pulmonary Disease

## 2020-04-21 ENCOUNTER — Ambulatory Visit (INDEPENDENT_AMBULATORY_CARE_PROVIDER_SITE_OTHER): Payer: Medicare Other | Admitting: Pulmonary Disease

## 2020-04-21 VITALS — BP 124/84 | HR 73 | Temp 98.2°F | Ht 65.0 in | Wt 141.0 lb

## 2020-04-21 DIAGNOSIS — R0602 Shortness of breath: Secondary | ICD-10-CM | POA: Diagnosis not present

## 2020-04-21 DIAGNOSIS — J4530 Mild persistent asthma, uncomplicated: Secondary | ICD-10-CM | POA: Diagnosis not present

## 2020-04-21 DIAGNOSIS — R5381 Other malaise: Secondary | ICD-10-CM

## 2020-04-21 LAB — PULMONARY FUNCTION TEST
DL/VA % pred: 101 %
DL/VA: 4.06 ml/min/mmHg/L
DLCO cor % pred: 78 %
DLCO cor: 14.76 ml/min/mmHg
DLCO unc % pred: 78 %
DLCO unc: 14.76 ml/min/mmHg
FEF 25-75 Post: 1.84 L/sec
FEF 25-75 Pre: 1.03 L/sec
FEF2575-%Change-Post: 78 %
FEF2575-%Pred-Post: 185 %
FEF2575-%Pred-Pre: 104 %
FEV1-%Change-Post: 15 %
FEV1-%Pred-Post: 98 %
FEV1-%Pred-Pre: 85 %
FEV1-Post: 1.69 L
FEV1-Pre: 1.46 L
FEV1FVC-%Change-Post: 8 %
FEV1FVC-%Pred-Pre: 102 %
FEV6-%Change-Post: 6 %
FEV6-%Pred-Post: 96 %
FEV6-%Pred-Pre: 91 %
FEV6-Post: 2.1 L
FEV6-Pre: 1.98 L
FEV6FVC-%Pred-Post: 106 %
FEV6FVC-%Pred-Pre: 106 %
FVC-%Change-Post: 6 %
FVC-%Pred-Post: 90 %
FVC-%Pred-Pre: 85 %
FVC-Post: 2.1 L
FVC-Pre: 1.98 L
Post FEV1/FVC ratio: 80 %
Post FEV6/FVC ratio: 100 %
Pre FEV1/FVC ratio: 74 %
Pre FEV6/FVC Ratio: 100 %
RV % pred: 83 %
RV: 2.18 L
TLC % pred: 84 %
TLC: 4.38 L

## 2020-04-21 MED ORDER — BREO ELLIPTA 100-25 MCG/INH IN AEPB: 1.0000 | INHALATION_SPRAY | Freq: Every day | RESPIRATORY_TRACT | 0 refills | Status: DC

## 2020-04-21 MED ORDER — ALBUTEROL SULFATE HFA 108 (90 BASE) MCG/ACT IN AERS: 1.0000 | INHALATION_SPRAY | RESPIRATORY_TRACT | 6 refills | Status: DC | PRN

## 2020-04-21 MED ORDER — BREO ELLIPTA 100-25 MCG/INH IN AEPB: 1.0000 | INHALATION_SPRAY | Freq: Every day | RESPIRATORY_TRACT | 6 refills | Status: DC

## 2020-04-21 NOTE — Progress Notes (Signed)
Full PFT performed today. °

## 2020-04-21 NOTE — Patient Instructions (Addendum)
Mild Persistent Asthma --START Breo 100-25 mcg ONE puff ONCE a day. This is your EVERYDAY inhaler --START Albuterol TWO puffs AS NEEDED for shortness of breath or wheezing. Also take 30 minutes prior to exercise. This is your as needed --Continue regular exercise. We discussed modified chair positions with yoga. If you continue to have balance issues, we can refer to physical therapy  Follow-up with me in 6 months

## 2020-04-21 NOTE — Progress Notes (Signed)
Subjective:   PATIENT ID: Ann Rodriguez GENDER: female DOB: 12-13-1928, MRN: [id number]   HPI  Chief Complaint  Patient presents with  . Follow-up    SOB and dry cough for a year.    Reason for Visit: Follow-up   Ms. Vonceil Upshur is a 87 year old female with kyphoscoliosis, SVT status post ablation Jul 18, 2004, hx TIA, HLD, prior sinus surgery who presents for shortness of breath.  Synopsis: Initially presented for longstanding shortness of breath for over a year. Denies associated chest pain, cough, wheezing or sputum production. Reports her symptoms have limited her ability to clean the house like she used to. PCP and Cardiology work-up has revealed chronic diastolic heart failure otherwise negative for etiology.   Since her last visit, her symptoms of dyspnea have remained unchanged.  Occurs with exertion.  Has limited her activities.  She was previously very active participating in water aerobics.  During the pandemic she has not been able to engage as often as she would like.  She did have a recent fall while doing yoga and has modified her movements by using a chair.  Does report benefit after starting this activity.  Denies any associated wheezing.  Does have a dry cough.  Symptoms do not seem different from day or night.  Social History: Social smoker. Quit in 07-19-58. Possibly 3 pack-years at most  I have personally reviewed patient's past medical/family/social history, allergies and current medications.  Past Medical History:  Diagnosis Date  . Arthritis   . Depression    PT'S HUSBAND AND SON HAVE DIED W/IN PAST YEAR 07-19-11  . GERD (gastroesophageal reflux disease)   . Hearing loss   . Hyperlipidemia   . Idiopathic peripheral neuropathy 07/18/2016  . Nocturnal leg cramps 01/22/2017  . Stroke (HCC)   . SVT (supraventricular tachycardia) (HCC) 09/2014  . Thyroid disease    No Known Allergies   Outpatient Medications Prior to Visit  Medication Sig Dispense Refill  . aspirin EC 81  MG tablet Take 81 mg by mouth daily.    Marland Kitchen atorvastatin (LIPITOR) 40 MG tablet Take 40 mg by mouth every evening.     . beta carotene w/minerals (OCUVITE) tablet Take 1 tablet by mouth daily with breakfast.    . BIOTIN PO Take 1 tablet by mouth daily.    . calcium carbonate (OS-CAL) 600 MG TABS Take 1,800 mg by mouth daily with breakfast.    . cholecalciferol (VITAMIN D) 1000 UNITS tablet Take 1,000 Units by mouth daily.    Marland Kitchen escitalopram (LEXAPRO) 5 MG tablet Take 5 mg by mouth daily.  0  . esomeprazole (NEXIUM) 20 MG capsule Take 20 mg by mouth every morning.     . meclizine (ANTIVERT) 25 MG tablet 1 tablet    . polyethylene glycol (MIRALAX / GLYCOLAX) packet Take 17 g by mouth every other day.    . vitamin B-12 (CYANOCOBALAMIN) 100 MCG tablet Take 100 mcg by mouth daily.     No facility-administered medications prior to visit.    Review of Systems  Constitutional: Negative for chills, diaphoresis, fever, malaise/fatigue and weight loss.  HENT: Negative for congestion.   Respiratory: Positive for shortness of breath. Negative for cough, hemoptysis, sputum production and wheezing.   Cardiovascular: Negative for chest pain, palpitations and leg swelling.     Objective:   Vitals:   04/21/20 1006  BP: 124/84  Pulse: 73  Temp: 98.2 F (36.8 C)  SpO2: 97%  Weight: 141 lb (64 kg)  Height: 5\' 5"  (1.651 m)   SpO2: 97 % O2 Device: None (Room air)  Physical Exam: General: Well-appearing, elderly female, no acute distress HENT: [place], AT Eyes: EOMI, no scleral icterus Respiratory: Clear to auscultation bilaterally.  No crackles, wheezing or rales Cardiovascular: RRR, -M/R/G, no JVD Extremities:-Edema,-tenderness Neuro: AAO x4, CNII-XII grossly intact Skin: Intact, no rashes or bruising Psych: Normal mood, normal affect  Data Reviewed:  Imaging: CTA 02/18/13 - No PE. Background emphysema CXR Ribs 07/25/19 Remote left rib fractures 5-7. Subacute left 10th rib fracture.    PFT: 10/28/14 FVC 2.81 (109%) FEV1 2.2 (116%) Ratio 70  TLC 95% DLCO 75% Interpretation: Mild obstructive defect with mildly reduced DLCO  04/21/20 FVC 2.1 (90%) FEV1 1.69 (98%) Ratio 74  TLC 84% DLCO 78% Interpretation: No obstruction on spirometry however bronchodilator response significant in FEV1. Normal lung volumes. Reduced gas exchange.  Labs: CBC    Component Value Date/Time   WBC 8.2 03/01/2017 1105   RBC 4.18 03/01/2017 1105   HGB 13.0 03/01/2017 1105   HCT 39.2 03/01/2017 1105   PLT 250 03/01/2017 1105   MCV 93.8 03/01/2017 1105   MCH 31.1 03/01/2017 1105   MCHC 33.2 03/01/2017 1105   RDW 13.2 03/01/2017 1105   LYMPHSABS 0.8 03/01/2017 1105   MONOABS 0.7 03/01/2017 1105   EOSABS 0.1 03/01/2017 1105   BASOSABS 0.0 03/01/2017 1105   Imaging, labs and tests noted above have been reviewed independently by me.    Assessment & Plan:   Discussion: 87 year old female with remote smoking history who initially presented with progressive shortness of breath > 2 years who presents for follow-up.  Prior PFTs demonstrate obstructive defect on CTA in 2014 commenting on emphysema.  She also has mild kyphoscoliosis and history of rib fractures that can contribute to her dyspnea.  There also may be a component of deconditioning related to decreased activity.  We reviewed PFTs in clinic which demonstrated reversible obstructive defect consistent with asthma.  She would benefit from ICS/LABA.  Addressed questions and concerns during this visit.  Mild Persistent Asthma --START Breo 100-25 mcg ONE puff ONCE a day. This is your EVERYDAY inhaler --START Albuterol TWO puffs AS NEEDED for shortness of breath or wheezing. Also take 30 minutes prior to exercise. This is your as needed --Continue regular exercise. We discussed modified chair positions with yoga. If you continue to have balance issues, we can refer to physical therapy  Kyphoscoliosis Remote rib  fractures Deconditioning --Supportive care --Encourage regular activity as tolerated.  OK to do yoga with chair  Health Maintenance Immunization History  Administered Date(s) Administered  . Influenza Split 11/20/2007  . Influenza, High Dose Seasonal PF 11/24/2009, 11/06/2010, 12/10/2011, 11/17/2012, 12/10/2013, 12/03/2014, 12/12/2015, 11/01/2016, 12/12/2017, 11/20/2018, 11/12/2019  . PFIZER(Purple Top)SARS-COV-2 Vaccination 04/02/2019, 04/23/2019, 12/16/2019  . Pneumococcal Conjugate-13 05/10/2014  . Pneumococcal Polysaccharide-23 03/17/2009   CT Lung Screen - not indicated  No orders of the defined types were placed in this encounter.  Meds ordered this encounter  Medications  . fluticasone furoate-vilanterol (BREO ELLIPTA) 100-25 MCG/INH AEPB    Sig: Inhale 1 puff into the lungs daily.    Dispense:  1 each    Refill:  6  . albuterol (VENTOLIN HFA) 108 (90 Base) MCG/ACT inhaler    Sig: Inhale 1-2 puffs into the lungs every 4 (four) hours as needed for wheezing or shortness of breath (Take 30 minutes prior to exercise).    Dispense:  1 each    Refill:  6  .  fluticasone furoate-vilanterol (BREO ELLIPTA) 100-25 MCG/INH AEPB    Sig: Inhale 1 puff into the lungs daily.    Dispense:  1 each    Refill:  0    Order Specific Question:   Lot Number?    Answer:   [id number]    Order Specific Question:   Expiration Date?    Answer:   12/09/2020    Return in about 6 months (around 10/19/2020).  I have spent a total time of 31-minutes on the day of the appointment reviewing prior documentation, coordinating care and discussing medical diagnosis and plan with the patient/family. Imaging, labs and tests included in this note have been reviewed and interpreted independently by me.  Cheskel Silverio Mechele Collin, MD [facility] 04/21/2020 10:12 AM  Office Number [phone]

## 2020-04-23 ENCOUNTER — Encounter: Payer: Self-pay | Admitting: Pulmonary Disease

## 2020-04-27 DIAGNOSIS — K219 Gastro-esophageal reflux disease without esophagitis: Secondary | ICD-10-CM | POA: Diagnosis not present

## 2020-04-27 DIAGNOSIS — E782 Mixed hyperlipidemia: Secondary | ICD-10-CM | POA: Diagnosis not present

## 2020-04-27 DIAGNOSIS — J449 Chronic obstructive pulmonary disease, unspecified: Secondary | ICD-10-CM | POA: Diagnosis not present

## 2020-04-27 DIAGNOSIS — I4891 Unspecified atrial fibrillation: Secondary | ICD-10-CM | POA: Diagnosis not present

## 2020-06-03 DIAGNOSIS — B349 Viral infection, unspecified: Secondary | ICD-10-CM | POA: Diagnosis not present

## 2020-06-03 DIAGNOSIS — Z03818 Encounter for observation for suspected exposure to other biological agents ruled out: Secondary | ICD-10-CM | POA: Diagnosis not present

## 2020-06-20 DIAGNOSIS — G4700 Insomnia, unspecified: Secondary | ICD-10-CM | POA: Diagnosis not present

## 2020-06-20 DIAGNOSIS — J4530 Mild persistent asthma, uncomplicated: Secondary | ICD-10-CM | POA: Diagnosis not present

## 2020-06-20 DIAGNOSIS — F419 Anxiety disorder, unspecified: Secondary | ICD-10-CM | POA: Diagnosis not present

## 2020-06-22 DIAGNOSIS — M7130 Other bursal cyst, unspecified site: Secondary | ICD-10-CM | POA: Diagnosis not present

## 2020-07-08 DIAGNOSIS — M25511 Pain in right shoulder: Secondary | ICD-10-CM | POA: Diagnosis not present

## 2020-07-18 DIAGNOSIS — M7989 Other specified soft tissue disorders: Secondary | ICD-10-CM | POA: Diagnosis not present

## 2020-07-18 DIAGNOSIS — Z6822 Body mass index (BMI) 22.0-22.9, adult: Secondary | ICD-10-CM | POA: Diagnosis not present

## 2020-07-18 DIAGNOSIS — R2231 Localized swelling, mass and lump, right upper limb: Secondary | ICD-10-CM | POA: Diagnosis not present

## 2020-07-18 DIAGNOSIS — M2550 Pain in unspecified joint: Secondary | ICD-10-CM | POA: Diagnosis not present

## 2020-07-25 DIAGNOSIS — M67441 Ganglion, right hand: Secondary | ICD-10-CM | POA: Diagnosis not present

## 2020-07-25 DIAGNOSIS — F419 Anxiety disorder, unspecified: Secondary | ICD-10-CM | POA: Diagnosis not present

## 2020-07-25 DIAGNOSIS — R202 Paresthesia of skin: Secondary | ICD-10-CM | POA: Diagnosis not present

## 2020-07-25 DIAGNOSIS — G4700 Insomnia, unspecified: Secondary | ICD-10-CM | POA: Diagnosis not present

## 2020-08-16 DIAGNOSIS — E782 Mixed hyperlipidemia: Secondary | ICD-10-CM | POA: Diagnosis not present

## 2020-08-16 DIAGNOSIS — M810 Age-related osteoporosis without current pathological fracture: Secondary | ICD-10-CM | POA: Diagnosis not present

## 2020-08-16 DIAGNOSIS — I4891 Unspecified atrial fibrillation: Secondary | ICD-10-CM | POA: Diagnosis not present

## 2020-08-16 DIAGNOSIS — K219 Gastro-esophageal reflux disease without esophagitis: Secondary | ICD-10-CM | POA: Diagnosis not present

## 2020-08-29 DIAGNOSIS — R29898 Other symptoms and signs involving the musculoskeletal system: Secondary | ICD-10-CM | POA: Diagnosis not present

## 2020-08-29 DIAGNOSIS — G629 Polyneuropathy, unspecified: Secondary | ICD-10-CM | POA: Diagnosis not present

## 2020-08-30 DIAGNOSIS — M25511 Pain in right shoulder: Secondary | ICD-10-CM | POA: Diagnosis not present

## 2020-08-31 ENCOUNTER — Other Ambulatory Visit: Payer: Self-pay

## 2020-08-31 ENCOUNTER — Encounter: Payer: Self-pay | Admitting: Neurology

## 2020-08-31 ENCOUNTER — Ambulatory Visit: Payer: Medicare Other | Admitting: Neurology

## 2020-08-31 VITALS — BP 152/78 | HR 72 | Ht 65.5 in | Wt 137.6 lb

## 2020-08-31 DIAGNOSIS — M21372 Foot drop, left foot: Secondary | ICD-10-CM

## 2020-08-31 DIAGNOSIS — M21371 Foot drop, right foot: Secondary | ICD-10-CM

## 2020-08-31 DIAGNOSIS — M25511 Pain in right shoulder: Secondary | ICD-10-CM | POA: Diagnosis not present

## 2020-08-31 HISTORY — DX: Foot drop, right foot: M21.371

## 2020-08-31 NOTE — Progress Notes (Signed)
Reason for visit: Peripheral neuropathy  Ann Rodriguez is an 87 y.o. female  History of present illness:  Ann Rodriguez is an 87 year old right-handed white female with a history of a peripheral neuropathy.  The patient reports some numbness and tingling sensations in the distal two thirds of the legs below the knees, she has had bilateral carpal tunnel syndrome surgery but has residual numbness of the left hand.  The [place] the beach in early June 2022, by 11 June, she noted that when she was walking that she was slapping the ground with her left foot.  The patient reported no pain or discomfort or increased numbness.  She does have chronic low back pain but denies any change in this and she denies any sciatica type pain.  She has not had any falls since onset of the new deficit.  The patient has not noted any alteration in her function of the right leg.  She has noted that when she walks, the left foot will slap the ground.  She comes into the office today for an evaluation.  Past Medical History:  Diagnosis Date   Arthritis    Depression    PT'S HUSBAND AND SON HAVE DIED W/IN PAST YEAR 07-03-2011   GERD (gastroesophageal reflux disease)    Hearing loss    Hyperlipidemia    Idiopathic peripheral neuropathy 07/18/2016   Nocturnal leg cramps 01/22/2017   Stroke [facility])    SVT (supraventricular tachycardia) (HCC) 09/2014   Thyroid disease     Past Surgical History:  Procedure Laterality Date   APPENDECTOMY     BACK SURGERY     lumbar   BLADDER SURGERY     BUNIONECTOMY     left and right with hammer toe repair   CARPAL TUNNEL RELEASE Bilateral 07/03/2015   ELECTROPHYSIOLOGIC STUDY N/A 11/03/2014   Procedure: SVT Ablation;  Surgeon: Marinus Maw, MD;  Location: [facility] INVASIVE CV LAB;  Service: Cardiovascular;  Laterality: N/A;   EYE SURGERY     bilateral cataract extraction with IOL   FOOT SURGERY     KNEE ARTHROSCOPY  02/09/2011   Procedure: ARTHROSCOPY KNEE;  Surgeon: Jacki Cones;   Location: [facility];  Service: Orthopedics;  Laterality: Left;  Left Knee Arthroscopy with Menisectomy medial, abrasion chondroplasty medial, and synovectomy, supra patella pouch.   NASAL SINUS SURGERY     OOPHORECTOMY     ROTATOR CUFF REPAIR  1/13,  3/13   x 3  right/   SALPINGECTOMY     SHOULDER OPEN ROTATOR CUFF REPAIR Left 06/26/2012   Procedure: LEFT SHOULDER ROTATOR CUFF REPAIR WITH ANCHORS ;  Surgeon: Jacki Cones, MD;  Location: [facility];  Service: Orthopedics;  Laterality: Left;   TOTAL KNEE ARTHROPLASTY  09/05/2011   Procedure: TOTAL KNEE ARTHROPLASTY;  Surgeon: Jacki Cones, MD;  Location: [facility];  Service: Orthopedics;  Laterality: Left;    Family History  Problem Relation Age of Onset   Diabetes Mother    Diabetes Son     Social history:  reports that she quit smoking about 61 years ago. Her smoking use included cigarettes. She has a 2.50 pack-year smoking history. She has never used smokeless tobacco. She reports current alcohol use of about 1.0 standard drink of alcohol per week. She reports that she does not use drugs.   No Known Allergies  Medications:  Prior to Admission medications   Medication Sig Start Date End Date Taking? Authorizing Provider  aspirin  EC 81 MG tablet Take 81 mg by mouth daily.   Yes [provider]  atorvastatin (LIPITOR) 40 MG tablet Take 40 mg by mouth every evening.    Yes [provider]  beta carotene w/minerals (OCUVITE) tablet Take 1 tablet by mouth daily with breakfast.   Yes [provider]  BIOTIN PO Take 1 tablet by mouth daily.   Yes [provider]  calcium carbonate (OS-CAL) 600 MG TABS Take 1,800 mg by mouth daily with breakfast.   Yes [provider]  cholecalciferol (VITAMIN D) 1000 UNITS tablet Take 1,000 Units by mouth daily.   Yes [provider]  escitalopram (LEXAPRO) 5 MG tablet Take 5 mg by mouth daily. 04/01/17  Yes [provider]  esomeprazole (NEXIUM) 20  MG capsule Take 20 mg by mouth every morning.    Yes [provider]  fluticasone furoate-vilanterol (BREO ELLIPTA) 100-25 MCG/INH AEPB Inhale 1 puff into the lungs daily. 04/21/20  Yes Luciano Cutter, MD  fluticasone furoate-vilanterol (BREO ELLIPTA) 100-25 MCG/INH AEPB Inhale 1 puff into the lungs daily. 04/21/20  Yes Luciano Cutter, MD  meclizine (ANTIVERT) 25 MG tablet 1 tablet 07/09/13  Yes [provider]  polyethylene glycol (MIRALAX / GLYCOLAX) packet Take 17 g by mouth every other day.   Yes [provider]  vitamin B-12 (CYANOCOBALAMIN) 100 MCG tablet Take 100 mcg by mouth daily.   Yes [provider]  albuterol (VENTOLIN HFA) 108 (90 Base) MCG/ACT inhaler Inhale 1-2 puffs into the lungs every 4 (four) hours as needed for wheezing or shortness of breath (Take 30 minutes prior to exercise). 04/21/20 05/21/20  Luciano Cutter, MD    ROS:  Out of a complete 14 system review of symptoms, the patient complains only of the following symptoms, and all other reviewed systems are negative.  Joint pains Walking difficulty Numbness in the feet, left hand  Blood pressure (!) 152/78, pulse 72, height 5' 5.5" (1.664 m), weight 137 lb 9.6 oz (62.4 kg).  Physical Exam  General: The patient is alert and cooperative at the time of the examination.  Skin: No significant peripheral edema is noted.   Neurologic Exam  Mental status: The patient is alert and oriented x 3 at the time of the examination. The patient has apparent normal recent and remote memory, with an apparently normal attention span and concentration ability.   Cranial nerves: Facial symmetry is present. Speech is normal, no aphasia or dysarthria is noted. Extraocular movements are full. Visual fields are full.  Motor: The patient has good strength in all 4 extremities, with exception of some weakness of the intrinsic muscles of the hands bilaterally, atrophy of the first dorsal interossei.   The patient has bilateral foot drop, more prominent on the left than the right, she has weakness with eversion of the feet, not with inversion.  Sensory examination: Soft touch sensation is symmetric on the face, arms, and legs.  There is a stocking pattern pinprick sensory deficit two thirds way up the lower extremities below the knees bilaterally.  Significant impairment of vibration and position sense is seen in both feet, not present in the hands.  Coordination: The patient has good finger-nose-finger and heel-to-shin bilaterally.  Gait and station: The patient has a minimally wide-based gait, she has a steppage gait pattern with the left leg.  During the visit, the patient did stumble and hit her right knee.  Reflexes: Deep tendon reflexes are symmetric, but are depressed throughout.  Assessment/Plan:  1.  Peripheral neuropathy  2.  Gait disturbance  3.  Bilateral foot drop, left greater than right  The patient has an acute on chronic foot drop issue.  The patient has bilateral foot drops at this point, more mild on the right than the left.  She now has more symptoms on the left, she has developed a steppage gait pattern.  She did catch her toe while walking in the office today, she did fall.  She bruised her right knee and has reinjured her right shoulder, she has a rotator cuff tear issue, she saw her orthopedic doctor yesterday for this.  The patient will be given an ankle support brace on the left, if this is not adequate we will need a full AFO brace.  The patient has a tendency to cross her leg left over right, she will try to prevent this in the future to allow for any healing process to occur.  She will follow-up here in 6 months.  She may see Dr. Terrace Arabia in the future.  Marlan Palau MD 08/31/2020 4:16 PM  [facility] [address] [place], [place] [address]  Phone [phone] Fax [phone]

## 2020-09-14 DIAGNOSIS — M25511 Pain in right shoulder: Secondary | ICD-10-CM | POA: Diagnosis not present

## 2020-09-22 DIAGNOSIS — M25511 Pain in right shoulder: Secondary | ICD-10-CM | POA: Diagnosis not present

## 2020-09-22 DIAGNOSIS — M21372 Foot drop, left foot: Secondary | ICD-10-CM | POA: Diagnosis not present

## 2020-09-23 DIAGNOSIS — E782 Mixed hyperlipidemia: Secondary | ICD-10-CM | POA: Diagnosis not present

## 2020-09-23 DIAGNOSIS — J4530 Mild persistent asthma, uncomplicated: Secondary | ICD-10-CM | POA: Diagnosis not present

## 2020-09-23 DIAGNOSIS — K219 Gastro-esophageal reflux disease without esophagitis: Secondary | ICD-10-CM | POA: Diagnosis not present

## 2020-09-23 DIAGNOSIS — I4891 Unspecified atrial fibrillation: Secondary | ICD-10-CM | POA: Diagnosis not present

## 2020-10-12 DIAGNOSIS — I7300 Raynaud's syndrome without gangrene: Secondary | ICD-10-CM | POA: Diagnosis not present

## 2020-10-12 DIAGNOSIS — M79641 Pain in right hand: Secondary | ICD-10-CM | POA: Diagnosis not present

## 2020-10-12 DIAGNOSIS — M79642 Pain in left hand: Secondary | ICD-10-CM | POA: Diagnosis not present

## 2020-10-12 DIAGNOSIS — R2230 Localized swelling, mass and lump, unspecified upper limb: Secondary | ICD-10-CM | POA: Diagnosis not present

## 2020-10-13 DIAGNOSIS — R7303 Prediabetes: Secondary | ICD-10-CM | POA: Diagnosis not present

## 2020-10-13 DIAGNOSIS — G4700 Insomnia, unspecified: Secondary | ICD-10-CM | POA: Diagnosis not present

## 2020-10-13 DIAGNOSIS — E559 Vitamin D deficiency, unspecified: Secondary | ICD-10-CM | POA: Diagnosis not present

## 2020-10-13 DIAGNOSIS — Z0000 Encounter for general adult medical examination without abnormal findings: Secondary | ICD-10-CM | POA: Diagnosis not present

## 2020-10-13 DIAGNOSIS — E782 Mixed hyperlipidemia: Secondary | ICD-10-CM | POA: Diagnosis not present

## 2020-10-17 DIAGNOSIS — S0083XA Contusion of other part of head, initial encounter: Secondary | ICD-10-CM | POA: Diagnosis not present

## 2020-10-17 DIAGNOSIS — W108XXA Fall (on) (from) other stairs and steps, initial encounter: Secondary | ICD-10-CM | POA: Diagnosis not present

## 2020-10-17 DIAGNOSIS — S01511A Laceration without foreign body of lip, initial encounter: Secondary | ICD-10-CM | POA: Diagnosis not present

## 2020-10-17 DIAGNOSIS — S20211A Contusion of right front wall of thorax, initial encounter: Secondary | ICD-10-CM | POA: Diagnosis not present

## 2020-10-19 DIAGNOSIS — K130 Diseases of lips: Secondary | ICD-10-CM | POA: Diagnosis not present

## 2020-10-19 DIAGNOSIS — S01511D Laceration without foreign body of lip, subsequent encounter: Secondary | ICD-10-CM | POA: Diagnosis not present

## 2020-10-20 DIAGNOSIS — M25511 Pain in right shoulder: Secondary | ICD-10-CM | POA: Diagnosis not present

## 2020-10-20 DIAGNOSIS — M25572 Pain in left ankle and joints of left foot: Secondary | ICD-10-CM | POA: Diagnosis not present

## 2020-10-24 DIAGNOSIS — R2231 Localized swelling, mass and lump, right upper limb: Secondary | ICD-10-CM | POA: Diagnosis not present

## 2020-10-24 DIAGNOSIS — Z4789 Encounter for other orthopedic aftercare: Secondary | ICD-10-CM | POA: Diagnosis not present

## 2020-10-25 DIAGNOSIS — H40011 Open angle with borderline findings, low risk, right eye: Secondary | ICD-10-CM | POA: Diagnosis not present

## 2020-10-25 DIAGNOSIS — H40051 Ocular hypertension, right eye: Secondary | ICD-10-CM | POA: Diagnosis not present

## 2020-10-25 DIAGNOSIS — H4311 Vitreous hemorrhage, right eye: Secondary | ICD-10-CM | POA: Diagnosis not present

## 2020-10-25 DIAGNOSIS — H3561 Retinal hemorrhage, right eye: Secondary | ICD-10-CM | POA: Diagnosis not present

## 2020-10-25 DIAGNOSIS — H5212 Myopia, left eye: Secondary | ICD-10-CM | POA: Diagnosis not present

## 2020-10-28 DIAGNOSIS — M21372 Foot drop, left foot: Secondary | ICD-10-CM | POA: Diagnosis not present

## 2020-11-02 DIAGNOSIS — M25572 Pain in left ankle and joints of left foot: Secondary | ICD-10-CM | POA: Diagnosis not present

## 2020-11-09 DIAGNOSIS — M25572 Pain in left ankle and joints of left foot: Secondary | ICD-10-CM | POA: Diagnosis not present

## 2020-11-17 DIAGNOSIS — M25572 Pain in left ankle and joints of left foot: Secondary | ICD-10-CM | POA: Diagnosis not present

## 2020-12-01 DIAGNOSIS — Q380 Congenital malformations of lips, not elsewhere classified: Secondary | ICD-10-CM | POA: Diagnosis not present

## 2020-12-01 DIAGNOSIS — R200 Anesthesia of skin: Secondary | ICD-10-CM | POA: Diagnosis not present

## 2020-12-16 DIAGNOSIS — Q380 Congenital malformations of lips, not elsewhere classified: Secondary | ICD-10-CM | POA: Diagnosis not present

## 2021-01-16 DIAGNOSIS — F419 Anxiety disorder, unspecified: Secondary | ICD-10-CM | POA: Diagnosis not present

## 2021-01-16 DIAGNOSIS — G4700 Insomnia, unspecified: Secondary | ICD-10-CM | POA: Diagnosis not present

## 2021-01-23 ENCOUNTER — Ambulatory Visit: Payer: Medicare Other | Admitting: Plastic Surgery

## 2021-01-23 ENCOUNTER — Other Ambulatory Visit: Payer: Self-pay

## 2021-01-23 VITALS — BP 115/81 | HR 61 | Ht 65.5 in | Wt 137.2 lb

## 2021-01-23 DIAGNOSIS — L905 Scar conditions and fibrosis of skin: Secondary | ICD-10-CM | POA: Diagnosis not present

## 2021-01-23 NOTE — Progress Notes (Signed)
Referring Provider Tally Joe, MD [phone] [place],  [place] [address]   CC:  Nodule - upper lip  Ann Rodriguez is an 87 y.o. female.  HPI: 87 year old female with a firm area of her upper lip that has been present since August when she fell and had her teeth go through her upper lip.  Her lip was repaired in the ED.  She says this sometimes bothers her when she is eating and drinking.  No Known Allergies  Outpatient Encounter Medications as of 01/23/2021  Medication Sig   aspirin EC 81 MG tablet Take 81 mg by mouth daily.   atorvastatin (LIPITOR) 40 MG tablet Take 40 mg by mouth every evening.    beta carotene w/minerals (OCUVITE) tablet Take 1 tablet by mouth daily with breakfast.   BIOTIN PO Take 1 tablet by mouth daily.   calcium carbonate (OS-CAL) 600 MG TABS Take 1,800 mg by mouth daily with breakfast.   cholecalciferol (VITAMIN D) 1000 UNITS tablet Take 1,000 Units by mouth daily.   escitalopram (LEXAPRO) 5 MG tablet Take 5 mg by mouth daily.   esomeprazole (NEXIUM) 20 MG capsule Take 20 mg by mouth every morning.    meclizine (ANTIVERT) 25 MG tablet 1 tablet   polyethylene glycol (MIRALAX / GLYCOLAX) packet Take 17 g by mouth every other day.   vitamin B-12 (CYANOCOBALAMIN) 100 MCG tablet Take 100 mcg by mouth daily.   albuterol (VENTOLIN HFA) 108 (90 Base) MCG/ACT inhaler Inhale 1-2 puffs into the lungs every 4 (four) hours as needed for wheezing or shortness of breath (Take 30 minutes prior to exercise).   fluticasone furoate-vilanterol (BREO ELLIPTA) 100-25 MCG/INH AEPB Inhale 1 puff into the lungs daily. (Patient not taking: Reported on 01/23/2021)   fluticasone furoate-vilanterol (BREO ELLIPTA) 100-25 MCG/INH AEPB Inhale 1 puff into the lungs daily. (Patient not taking: Reported on 01/23/2021)   No facility-administered encounter medications on file as of 01/23/2021.     Past Medical History:  Diagnosis Date   Arthritis    Bilateral foot-drop  08/31/2020   Depression    PT'S HUSBAND AND SON HAVE DIED W/IN PAST YEAR Jul 14, 2011   GERD (gastroesophageal reflux disease)    Hearing loss    Hyperlipidemia    Idiopathic peripheral neuropathy 07/18/2016   Nocturnal leg cramps 01/22/2017   Stroke [facility])    SVT (supraventricular tachycardia) (HCC) 09/2014   Thyroid disease     Past Surgical History:  Procedure Laterality Date   APPENDECTOMY     BACK SURGERY     lumbar   BLADDER SURGERY     BUNIONECTOMY     left and right with hammer toe repair   CARPAL TUNNEL RELEASE Bilateral 2015/07/14   ELECTROPHYSIOLOGIC STUDY N/A 11/03/2014   Procedure: SVT Ablation;  Surgeon: Marinus Maw, MD;  Location: [facility] INVASIVE CV LAB;  Service: Cardiovascular;  Laterality: N/A;   EYE SURGERY     bilateral cataract extraction with IOL   FOOT SURGERY     KNEE ARTHROSCOPY  02/09/2011   Procedure: ARTHROSCOPY KNEE;  Surgeon: Jacki Cones;  Location: [facility];  Service: Orthopedics;  Laterality: Left;  Left Knee Arthroscopy with Menisectomy medial, abrasion chondroplasty medial, and synovectomy, supra patella pouch.   NASAL SINUS SURGERY     OOPHORECTOMY     ROTATOR CUFF REPAIR  1/13,  3/13   x 3  right/   SALPINGECTOMY     SHOULDER OPEN ROTATOR CUFF REPAIR Left  06/26/2012   Procedure: LEFT SHOULDER ROTATOR CUFF REPAIR WITH ANCHORS ;  Surgeon: Tobi Bastos, MD;  Location[facility];  Service: Orthopedics;  Laterality: Left;   TOTAL KNEE ARTHROPLASTY  09/05/2011   Procedure: TOTAL KNEE ARTHROPLASTY;  Surgeon: Tobi Bastos, MD;  Location: [facility];  Service: Orthopedics;  Laterality: Left;    Family History  Problem Relation Age of Onset   Diabetes Mother    Diabetes Son     Social History   Social History Narrative   Lives alone   Caffeine use: 4 drinks coffee per day   Right-handed     Review of Systems General: Denies fevers, chills, weight loss CV: Denies chest pain, shortness of breath, palpitations   Physical Exam Vitals with BMI 01/23/2021  08/31/2020 04/21/2020  Height 5' 5.5" 5' 5.5" 5\' 5"   Weight 137 lbs 3 oz 137 lbs 10 oz 141 lbs  BMI 22.48 [id number]  Systolic [id number]  Diastolic 81 78 84  Pulse 61 72 73    General:  No acute distress,  Alert and oriented, Non-Toxic, Normal speech and affect Heent:  skin well approximated with very subtle scarring.  On palpation there is some scar tissue felt deeper in the lip.   Assessment/Plan Scar tissue, upper lip after trauma.  I recommended continued scar massage.  We could consider steroid injection if the scar tissue is still bothering her 6 months after the trauma.  Time based coding: 17 minutes were spent with the patient.  Greater than 50% was spent on counseling cordination of care.   We discussed risks and benefits of steroid injection in the future.  Lennice Sites 01/23/2021, 12:45 PM

## 2021-02-06 ENCOUNTER — Emergency Department (HOSPITAL_COMMUNITY): Payer: Medicare Other

## 2021-02-06 ENCOUNTER — Observation Stay (HOSPITAL_COMMUNITY)
Admission: EM | Admit: 2021-02-06 | Discharge: 2021-02-08 | Disposition: A | Discharge status: Home / Self Care | Payer: Medicare Other | Attending: Surgery | Admitting: Surgery

## 2021-02-06 ENCOUNTER — Encounter (HOSPITAL_COMMUNITY): Payer: Self-pay

## 2021-02-06 ENCOUNTER — Other Ambulatory Visit: Payer: Self-pay

## 2021-02-06 DIAGNOSIS — I10 Essential (primary) hypertension: Secondary | ICD-10-CM | POA: Insufficient documentation

## 2021-02-06 DIAGNOSIS — Z79899 Other long term (current) drug therapy: Secondary | ICD-10-CM | POA: Diagnosis not present

## 2021-02-06 DIAGNOSIS — Z20822 Contact with and (suspected) exposure to covid-19: Secondary | ICD-10-CM | POA: Diagnosis not present

## 2021-02-06 DIAGNOSIS — Z87891 Personal history of nicotine dependence: Secondary | ICD-10-CM | POA: Insufficient documentation

## 2021-02-06 DIAGNOSIS — S2242XA Multiple fractures of ribs, left side, initial encounter for closed fracture: Principal | ICD-10-CM | POA: Insufficient documentation

## 2021-02-06 DIAGNOSIS — Y92410 Unspecified street and highway as the place of occurrence of the external cause: Secondary | ICD-10-CM | POA: Insufficient documentation

## 2021-02-06 DIAGNOSIS — R52 Pain, unspecified: Secondary | ICD-10-CM

## 2021-02-06 DIAGNOSIS — Z7982 Long term (current) use of aspirin: Secondary | ICD-10-CM | POA: Insufficient documentation

## 2021-02-06 DIAGNOSIS — S0990XA Unspecified injury of head, initial encounter: Secondary | ICD-10-CM | POA: Diagnosis not present

## 2021-02-06 DIAGNOSIS — S299XXA Unspecified injury of thorax, initial encounter: Secondary | ICD-10-CM | POA: Diagnosis not present

## 2021-02-06 DIAGNOSIS — I491 Atrial premature depolarization: Secondary | ICD-10-CM | POA: Diagnosis not present

## 2021-02-06 DIAGNOSIS — S2249XA Multiple fractures of ribs, unspecified side, initial encounter for closed fracture: Secondary | ICD-10-CM | POA: Diagnosis present

## 2021-02-06 DIAGNOSIS — S2222XA Fracture of body of sternum, initial encounter for closed fracture: Secondary | ICD-10-CM | POA: Insufficient documentation

## 2021-02-06 DIAGNOSIS — Z96652 Presence of left artificial knee joint: Secondary | ICD-10-CM | POA: Insufficient documentation

## 2021-02-06 DIAGNOSIS — S2231XA Fracture of one rib, right side, initial encounter for closed fracture: Secondary | ICD-10-CM | POA: Diagnosis not present

## 2021-02-06 DIAGNOSIS — Z041 Encounter for examination and observation following transport accident: Secondary | ICD-10-CM | POA: Diagnosis not present

## 2021-02-06 LAB — COMPREHENSIVE METABOLIC PANEL
ALT: 14 U/L (ref 0–44)
AST: 24 U/L (ref 15–41)
Albumin: 3.5 g/dL (ref 3.5–5.0)
Alkaline Phosphatase: 65 U/L (ref 38–126)
Anion gap: 9 (ref 5–15)
BUN: 19 mg/dL (ref 8–23)
CO2: 24 mmol/L (ref 22–32)
Calcium: 9.3 mg/dL (ref 8.9–10.3)
Chloride: 104 mmol/L (ref 98–111)
Creatinine, Ser: 0.76 mg/dL (ref 0.44–1.00)
GFR, Estimated: >60 mL/min (ref >60)
Glucose, Bld: 100 mg/dL — ABNORMAL HIGH (ref 70–99)
Potassium: 4.4 mmol/L (ref 3.5–5.1)
Sodium: 137 mmol/L (ref 135–145)
Total Bilirubin: 0.9 mg/dL (ref 0.3–1.2)
Total Protein: 6.1 g/dL — ABNORMAL LOW (ref 6.5–8.1)

## 2021-02-06 LAB — CBC WITH DIFFERENTIAL/PLATELET
Abs Immature Granulocytes: 0.08 10*3/uL — ABNORMAL HIGH (ref 0.00–0.07)
Basophils Absolute: 0 10*3/uL (ref 0.0–0.1)
Basophils Relative: 0 %
Eosinophils Absolute: 0 10*3/uL (ref 0.0–0.5)
Eosinophils Relative: 0 %
HCT: 41.2 % (ref 36.0–46.0)
Hemoglobin: 13 g/dL (ref 12.0–15.0)
Immature Granulocytes: 1 %
Lymphocytes Relative: 7 %
Lymphs Abs: 0.7 10*3/uL (ref 0.7–4.0)
MCH: 30.7 pg (ref 26.0–34.0)
MCHC: 31.6 g/dL (ref 30.0–36.0)
MCV: 97.2 fL (ref 80.0–100.0)
Monocytes Absolute: 0.7 10*3/uL (ref 0.1–1.0)
Monocytes Relative: 7 %
Neutro Abs: 9.2 10*3/uL — ABNORMAL HIGH (ref 1.7–7.7)
Neutrophils Relative %: 85 %
Platelets: 230 10*3/uL (ref 150–400)
RBC: 4.24 MIL/uL (ref 3.87–5.11)
RDW: 13 % (ref 11.5–15.5)
WBC: 10.8 10*3/uL — ABNORMAL HIGH (ref 4.0–10.5)
nRBC: 0 % (ref 0.0–0.2)

## 2021-02-06 LAB — TROPONIN I (HIGH SENSITIVITY): Troponin I (High Sensitivity): 23 ng/L — ABNORMAL HIGH (ref <18)

## 2021-02-06 MED ORDER — SODIUM CHLORIDE 0.9 % IV BOLUS
500.0000 mL | Freq: Once | INTRAVENOUS | Status: AC
  Administered 2021-02-06: 18:00:00 500 mL via INTRAVENOUS

## 2021-02-06 MED ORDER — MORPHINE SULFATE (PF) 4 MG/ML IV SOLN
4.0000 mg | Freq: Once | INTRAVENOUS | Status: AC
  Administered 2021-02-06: 18:00:00 4 mg via INTRAVENOUS
  Filled 2021-02-06: qty 1

## 2021-02-06 MED ORDER — IOHEXOL 350 MG/ML SOLN
65.0000 mL | Freq: Once | INTRAVENOUS | Status: AC | PRN
  Administered 2021-02-06: 65 mL via INTRAVENOUS

## 2021-02-06 MED ORDER — HYDROMORPHONE HCL 1 MG/ML IJ SOLN
1.0000 mg | Freq: Once | INTRAMUSCULAR | Status: AC
  Administered 2021-02-06: 1 mg via INTRAVENOUS

## 2021-02-06 MED ORDER — HYDROMORPHONE HCL 1 MG/ML IJ SOLN
INTRAMUSCULAR | Status: AC
  Filled 2021-02-06: qty 1

## 2021-02-06 MED ORDER — HYDROMORPHONE HCL 1 MG/ML IJ SOLN
1.0000 mg | Freq: Once | INTRAMUSCULAR | Status: AC
  Administered 2021-02-06: 21:00:00 1 mg via INTRAVENOUS
  Filled 2021-02-06: qty 1
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
## 2021-02-07 DIAGNOSIS — S2249XA Multiple fractures of ribs, unspecified side, initial encounter for closed fracture: Secondary | ICD-10-CM | POA: Diagnosis present

## 2021-02-07 DIAGNOSIS — S2242XA Multiple fractures of ribs, left side, initial encounter for closed fracture: Secondary | ICD-10-CM | POA: Diagnosis not present

## 2021-02-07 LAB — RESP PANEL BY RT-PCR (FLU A&B, COVID) ARPGX2
Influenza A by PCR: NEGATIVE
Influenza B by PCR: NEGATIVE
SARS Coronavirus 2 by RT PCR: NEGATIVE

## 2021-02-07 MED ORDER — IBUPROFEN 400 MG PO TABS
400.0000 mg | ORAL_TABLET | Freq: Four times a day (QID) | ORAL | Status: DC
  Administered 2021-02-07 – 2021-02-08 (×3): 400 mg via ORAL
  Filled 2021-02-07 (×2): qty 1

## 2021-02-07 MED ORDER — LIDOCAINE 5 % EX PTCH
2.0000 | MEDICATED_PATCH | CUTANEOUS | Status: DC
  Administered 2021-02-07 – 2021-02-08 (×2): 2 via TRANSDERMAL
  Filled 2021-02-07: qty 2

## 2021-02-07 MED ORDER — DOCUSATE SODIUM 100 MG PO CAPS
100.0000 mg | ORAL_CAPSULE | Freq: Two times a day (BID) | ORAL | Status: DC
  Administered 2021-02-07 – 2021-02-08 (×2): 100 mg via ORAL
  Filled 2021-02-07 (×2): qty 1

## 2021-02-07 MED ORDER — ONDANSETRON HCL 4 MG/2ML IJ SOLN
4.0000 mg | Freq: Once | INTRAMUSCULAR | Status: AC
  Administered 2021-02-07: 4 mg via INTRAVENOUS

## 2021-02-07 MED ORDER — ONDANSETRON 4 MG PO TBDP: 4.0000 mg | ORAL_TABLET | Freq: Four times a day (QID) | ORAL | Status: DC | PRN

## 2021-02-07 MED ORDER — ACETAMINOPHEN 500 MG PO TABS
ORAL_TABLET | ORAL | Status: AC
  Filled 2021-02-07: qty 2

## 2021-02-07 MED ORDER — METHOCARBAMOL 500 MG PO TABS
1000.0000 mg | ORAL_TABLET | Freq: Three times a day (TID) | ORAL | Status: DC
  Administered 2021-02-07 – 2021-02-08 (×4): 1000 mg via ORAL
  Filled 2021-02-07 (×2): qty 2

## 2021-02-07 MED ORDER — SODIUM CHLORIDE 0.9 % IV SOLN: 250.0000 mL | INTRAVENOUS | Status: DC | PRN

## 2021-02-07 MED ORDER — ACETAMINOPHEN 500 MG PO TABS
1000.0000 mg | ORAL_TABLET | Freq: Once | ORAL | Status: AC
  Administered 2021-02-07: 1000 mg via ORAL

## 2021-02-07 MED ORDER — SODIUM CHLORIDE 0.9% FLUSH: 3.0000 mL | INTRAVENOUS | Status: DC | PRN

## 2021-02-07 MED ORDER — METHOCARBAMOL 500 MG PO TABS
ORAL_TABLET | ORAL | Status: AC
  Filled 2021-02-07: qty 2

## 2021-02-07 MED ORDER — MORPHINE SULFATE (PF) 2 MG/ML IV SOLN: 2.0000 mg | Freq: Four times a day (QID) | INTRAVENOUS | Status: DC | PRN

## 2021-02-07 MED ORDER — ONDANSETRON HCL 4 MG/2ML IJ SOLN
INTRAMUSCULAR | Status: AC
  Filled 2021-02-07: qty 2

## 2021-02-07 MED ORDER — LIDOCAINE 5 % EX PTCH
MEDICATED_PATCH | CUTANEOUS | Status: AC
  Filled 2021-02-07: qty 1

## 2021-02-07 MED ORDER — ONDANSETRON HCL 4 MG/2ML IJ SOLN: 4.0000 mg | Freq: Four times a day (QID) | INTRAMUSCULAR | Status: DC | PRN

## 2021-02-07 MED ORDER — ACETAMINOPHEN 500 MG PO TABS
1000.0000 mg | ORAL_TABLET | Freq: Four times a day (QID) | ORAL | Status: DC
  Administered 2021-02-07 – 2021-02-08 (×4): 1000 mg via ORAL
  Filled 2021-02-07 (×2): qty 2

## 2021-02-07 MED ORDER — SODIUM CHLORIDE 0.9% FLUSH
3.0000 mL | Freq: Two times a day (BID) | INTRAVENOUS | Status: DC
  Administered 2021-02-07 – 2021-02-08 (×2): 3 mL via INTRAVENOUS

## 2021-02-07 MED ORDER — OXYCODONE HCL 5 MG PO TABS
ORAL_TABLET | ORAL | Status: AC
  Administered 2021-02-07: 5 mg via ORAL
  Filled 2021-02-07: qty 1

## 2021-02-07 MED ORDER — PANTOPRAZOLE SODIUM 40 MG PO TBEC
40.0000 mg | DELAYED_RELEASE_TABLET | Freq: Every day | ORAL | Status: DC
  Administered 2021-02-07 – 2021-02-08 (×2): 40 mg via ORAL
  Filled 2021-02-07: qty 1

## 2021-02-07 MED ORDER — OXYCODONE HCL 5 MG PO TABS
2.5000 mg | ORAL_TABLET | ORAL | Status: DC | PRN
  Administered 2021-02-07 – 2021-02-08 (×4): 5 mg via ORAL
  Filled 2021-02-07 (×2): qty 1

## 2021-02-07 MED ORDER — LACTATED RINGERS IV SOLN: INTRAVENOUS | Status: DC

## 2021-02-07 MED ORDER — DOCUSATE SODIUM 100 MG PO CAPS
ORAL_CAPSULE | ORAL | Status: AC
  Administered 2021-02-07: 100 mg via ORAL
  Filled 2021-02-07: qty 1

## 2021-02-07 MED ORDER — ENOXAPARIN SODIUM 30 MG/0.3ML IJ SOSY
30.0000 mg | PREFILLED_SYRINGE | Freq: Two times a day (BID) | INTRAMUSCULAR | Status: DC
  Administered 2021-02-08: 30 mg via SUBCUTANEOUS
  Filled 2021-02-07: qty 0.3

## 2021-02-07 NOTE — ED Notes (Signed)
Vira Agar (daughter) [phone], is req an update, states pt mentioned surgery and daughter has questopns

## 2021-02-07 NOTE — TOC CAGE-AID Note (Signed)
Transition of Care [facility]) - CAGE-AID Screening   Patient Details  Name: Ann Rodriguez MRN: [id number] Date of Birth: 02-27-1929  Transition of Care [facility]) CM/SW Contact:    Suraj Ramdass C Tarpley-Carter, LCSWA Phone Number: 02/07/2021, 9:29 AM   Clinical Narrative: Pt is unable to participate in Cage Aid.  Pt is not appropriate for assessment.  Sadiya Durand Tarpley-Carter, MSW, LCSW-A Pronouns:  She/Her/Hers [facility] HealthTransitions of Care Clinical Social Worker Direct Number:  [phone] [email or web address]   CAGE-AID Screening: Substance Abuse Screening unable to be completed due to: : Patient unable to participate (Pt is not appropriate for assessment.)             Substance Abuse Education Offered: No
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
## 2021-02-07 NOTE — ED Notes (Signed)
MD Cardama at bedside  

## 2021-02-07 NOTE — ED Notes (Signed)
Pt ambulated to restroom with assistance - pt beginning to feel nauseated and spitting up - verbal for 4mg  of zofran by MD Cardama

## 2021-02-08 DIAGNOSIS — S2242XA Multiple fractures of ribs, left side, initial encounter for closed fracture: Secondary | ICD-10-CM | POA: Diagnosis not present

## 2021-02-08 LAB — BASIC METABOLIC PANEL
Anion gap: 6 (ref 5–15)
BUN: 16 mg/dL (ref 8–23)
CO2: 27 mmol/L (ref 22–32)
Calcium: 8.7 mg/dL — ABNORMAL LOW (ref 8.9–10.3)
Chloride: 105 mmol/L (ref 98–111)
Creatinine, Ser: 0.82 mg/dL (ref 0.44–1.00)
GFR, Estimated: >60 mL/min (ref >60)
Glucose, Bld: 104 mg/dL — ABNORMAL HIGH (ref 70–99)
Potassium: 4.1 mmol/L (ref 3.5–5.1)
Sodium: 138 mmol/L (ref 135–145)

## 2021-02-08 LAB — CBC
HCT: 37.3 % (ref 36.0–46.0)
Hemoglobin: 12 g/dL (ref 12.0–15.0)
MCH: 31 pg (ref 26.0–34.0)
MCHC: 32.2 g/dL (ref 30.0–36.0)
MCV: 96.4 fL (ref 80.0–100.0)
Platelets: 206 10*3/uL (ref 150–400)
RBC: 3.87 MIL/uL (ref 3.87–5.11)
RDW: 13 % (ref 11.5–15.5)
WBC: 7.7 10*3/uL (ref 4.0–10.5)
nRBC: 0 % (ref 0.0–0.2)

## 2021-02-08 MED ORDER — LIDOCAINE 5 % EX PTCH: 2.0000 | MEDICATED_PATCH | CUTANEOUS | 0 refills | Status: DC

## 2021-02-08 MED ORDER — OXYCODONE HCL 5 MG PO TABS: 2.5000 mg | ORAL_TABLET | ORAL | 0 refills | Status: DC | PRN

## 2021-02-08 MED ORDER — METHOCARBAMOL 1000 MG PO TABS: 1000.0000 mg | ORAL_TABLET | Freq: Three times a day (TID) | ORAL | 0 refills | Status: DC

## 2021-02-08 MED ORDER — IBUPROFEN 200 MG PO TABS: 400.0000 mg | ORAL_TABLET | Freq: Four times a day (QID) | ORAL | Status: DC | PRN

## 2021-02-08 MED ORDER — DOCUSATE SODIUM 100 MG PO CAPS
100.0000 mg | ORAL_CAPSULE | Freq: Every day | ORAL | Status: DC | PRN
Start: 2021-02-08 — End: 2021-10-19

## 2021-02-08 MED ORDER — ACETAMINOPHEN 500 MG PO TABS
1000.0000 mg | ORAL_TABLET | Freq: Four times a day (QID) | ORAL | Status: AC | PRN
Start: 2021-02-08 — End: ?
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
## 2021-02-08 NOTE — Care Management Obs Status (Signed)
MEDICARE OBSERVATION STATUS NOTIFICATION   Patient Details  Name: Ann Rodriguez MRN: [id number] Date of Birth: Jun 16, 1928   Medicare Observation Status Notification Given:  Yes    Lawerance Sabal, RN 02/08/2021, 11:00 AM

## 2021-02-08 NOTE — TOC Transition Note (Signed)
Transition of Care [facility]) - CM/SW Discharge Note   Patient Details  Name: Ann Rodriguez MRN: [id number] Date of Birth: October 13, 1928  Transition of Care [facility]) CM/SW Contact:  Glennon Mac, RN Phone Number: 02/08/2021, 12:03 PM   Clinical Narrative:    Patient medically stable for discharge home today with daughter and [facility] arrangements as previously arranged.  Notified HH provider of dc today.    Final next level of care: Home w Home Health Services Barriers to Discharge: Barriers Resolved   Patient Goals and CMS Choice Patient states their goals for this hospitalization and ongoing recovery are:: to go home CMS Medicare.gov Compare Post Acute Care list provided to:: Patient Choice offered to / list presented to : Patient                        Discharge Plan and Services   Discharge Planning Services: CM Consult Post Acute Care Choice: Home Health                               Social Determinants of Health (SDOH) Interventions     Readmission Risk Interventions No flowsheet data found.   Quintella Baton, RN, BSN  Trauma/Neuro ICU Case Manager [phone]

## 2021-02-08 NOTE — Progress Notes (Signed)
Patient ID: Ann Rodriguez, female   DOB: May 24, 1928, 87 y.o.   MRN: [id number]   An After Visit Summary was printed and given to the patient.   Patient education given on Medication changes and follow up appointments and the patient expresses understanding and acceptance of instructions.   Daughter to drive home.  Lidia Collum 02/08/2021 2:31 PM

## 2021-02-16 DIAGNOSIS — S2242XD Multiple fractures of ribs, left side, subsequent encounter for fracture with routine healing: Secondary | ICD-10-CM | POA: Diagnosis not present

## 2021-02-16 DIAGNOSIS — Z87828 Personal history of other (healed) physical injury and trauma: Secondary | ICD-10-CM | POA: Diagnosis not present

## 2021-02-16 DIAGNOSIS — S2220XD Unspecified fracture of sternum, subsequent encounter for fracture with routine healing: Secondary | ICD-10-CM | POA: Diagnosis not present

## 2021-02-16 DIAGNOSIS — R0989 Other specified symptoms and signs involving the circulatory and respiratory systems: Secondary | ICD-10-CM | POA: Diagnosis not present

## 2021-02-27 DIAGNOSIS — Z87828 Personal history of other (healed) physical injury and trauma: Secondary | ICD-10-CM | POA: Diagnosis not present

## 2021-02-27 DIAGNOSIS — S2220XD Unspecified fracture of sternum, subsequent encounter for fracture with routine healing: Secondary | ICD-10-CM | POA: Diagnosis not present

## 2021-02-27 DIAGNOSIS — S2242XD Multiple fractures of ribs, left side, subsequent encounter for fracture with routine healing: Secondary | ICD-10-CM | POA: Diagnosis not present

## 2021-03-02 ENCOUNTER — Ambulatory Visit: Payer: Medicare Other | Admitting: Neurology

## 2021-03-02 DIAGNOSIS — R0781 Pleurodynia: Secondary | ICD-10-CM | POA: Diagnosis not present

## 2021-03-02 DIAGNOSIS — R072 Precordial pain: Secondary | ICD-10-CM | POA: Diagnosis not present

## 2021-03-02 DIAGNOSIS — S2220XA Unspecified fracture of sternum, initial encounter for closed fracture: Secondary | ICD-10-CM | POA: Diagnosis not present

## 2021-03-21 DIAGNOSIS — J449 Chronic obstructive pulmonary disease, unspecified: Secondary | ICD-10-CM | POA: Diagnosis not present

## 2021-03-21 DIAGNOSIS — E782 Mixed hyperlipidemia: Secondary | ICD-10-CM | POA: Diagnosis not present

## 2021-03-21 DIAGNOSIS — J4530 Mild persistent asthma, uncomplicated: Secondary | ICD-10-CM | POA: Diagnosis not present

## 2021-03-21 DIAGNOSIS — G4700 Insomnia, unspecified: Secondary | ICD-10-CM | POA: Diagnosis not present

## 2021-03-31 ENCOUNTER — Other Ambulatory Visit: Payer: Self-pay

## 2021-03-31 ENCOUNTER — Ambulatory Visit
Admission: RE | Admit: 2021-03-31 | Discharge: 2021-03-31 | Disposition: A | Discharge status: Home / Self Care | Payer: Medicare Other | Source: Ambulatory Visit | Attending: Physician Assistant | Admitting: Physician Assistant

## 2021-03-31 ENCOUNTER — Other Ambulatory Visit: Payer: Self-pay | Admitting: Physician Assistant

## 2021-03-31 DIAGNOSIS — M25422 Effusion, left elbow: Secondary | ICD-10-CM

## 2021-03-31 DIAGNOSIS — M7989 Other specified soft tissue disorders: Secondary | ICD-10-CM | POA: Diagnosis not present

## 2021-04-17 ENCOUNTER — Other Ambulatory Visit: Payer: Self-pay

## 2021-04-17 ENCOUNTER — Ambulatory Visit: Payer: Medicare Other | Admitting: Plastic Surgery

## 2021-04-17 ENCOUNTER — Encounter: Payer: Self-pay | Admitting: Plastic Surgery

## 2021-04-17 DIAGNOSIS — L905 Scar conditions and fibrosis of skin: Secondary | ICD-10-CM

## 2021-04-17 NOTE — Progress Notes (Signed)
° °  Referring Provider Tally Joe, MD [address] [place],  [place] [address]   CC:  Lip scar  Ann Rodriguez is an 88 y.o. female.  HPI: Patient has a lip scar in her central upper lip after a fall.  Her teeth went through her upper lip.  It was repaired in the ER.  She notes it sometimes bothers her when she is eating or drinking and is worried is gotten a little bit larger.  She also feels some other scar tissue that is more lateral. Review of Systems General: No fever chills or weight loss.  Physical Exam Vitals with BMI 02/08/2021 02/08/2021 02/08/2021  Height - - -  Weight - - -  BMI - - -  Systolic 120 138 333  Diastolic 63 68 80  Pulse 84 62 64    General:  No acute distress,  Alert and oriented, Non-Toxic, Normal speech and affect HEENT: Some scar tissue felt in the central lip feels smaller than on previous exam, unable to palpate the lateral scar tissue the patient describes  Assessment/Plan We discussed the option of injecting steroids but the patient's scar tissue is not visible at all on physical exam so there is a chance that she could develop hypopigmentation that would be visible.  We discussed this and both agreed that we would not perform an injection at this time.  Janne Napoleon 04/17/2021, 8:45 AM

## 2021-04-20 DIAGNOSIS — R002 Palpitations: Secondary | ICD-10-CM | POA: Diagnosis not present

## 2021-04-20 DIAGNOSIS — E782 Mixed hyperlipidemia: Secondary | ICD-10-CM | POA: Diagnosis not present

## 2021-04-20 DIAGNOSIS — R7303 Prediabetes: Secondary | ICD-10-CM | POA: Diagnosis not present

## 2021-04-20 DIAGNOSIS — F419 Anxiety disorder, unspecified: Secondary | ICD-10-CM | POA: Diagnosis not present

## 2021-04-21 ENCOUNTER — Telehealth: Payer: Self-pay | Admitting: Interventional Cardiology

## 2021-04-21 NOTE — Telephone Encounter (Signed)
Patient c/o Palpitations:  High priority if patient c/o lightheadedness, shortness of breath, or chest pain  How long have you had palpitations/irregular HR/ Afib? About two weeks  Are you having the symptoms now? no  Are you currently experiencing lightheadedness, SOB or CP? Lightheaded. Patient will sit down when it happens  Do you have a history of afib (atrial fibrillation) or irregular heart rhythm? yes  Have you checked your BP or HR? (document readings if available):  117/78 at her PCP this week  Are you experiencing any other symptoms? No   Patient went to see her PCP this week and told him about her irregular heart rhythm. She said when she has the symptoms that it is hard to describe what is really happening but she can definitely tell something is happening. Her PCP advised her to come in and be seen by Dr. Katrinka Blazing

## 2021-04-21 NOTE — Telephone Encounter (Signed)
Spoke to pt. She reports light headedness for the past couple of weeks, occurs daily. She said if feels different that her past racing heart rates. States she wen to PCP who noted irregular heart rhythm but she is not sure what is is. Denies chest pain, edema, wt gain or syncope.  Aware will have RN follow up with her next week. May be that she will need a monitor to determine what is occurring prior to arranging an OV. Pt aware nurse will discuss w/ MD next week and then call with plan. Patient verbalized understanding and agreeable to plan.

## 2021-04-24 NOTE — Telephone Encounter (Signed)
Spoke with pt and scheduled her to see DOD tomorrow for eval.  Pt in agreement with plan.

## 2021-04-25 ENCOUNTER — Ambulatory Visit (INDEPENDENT_AMBULATORY_CARE_PROVIDER_SITE_OTHER): Payer: Medicare Other

## 2021-04-25 ENCOUNTER — Encounter: Payer: Self-pay | Admitting: Cardiology

## 2021-04-25 ENCOUNTER — Ambulatory Visit: Payer: Medicare Other | Admitting: Cardiology

## 2021-04-25 ENCOUNTER — Other Ambulatory Visit: Payer: Self-pay

## 2021-04-25 VITALS — BP 142/70 | HR 78 | Ht 65.5 in | Wt 138.0 lb

## 2021-04-25 DIAGNOSIS — R002 Palpitations: Secondary | ICD-10-CM | POA: Diagnosis not present

## 2021-04-25 DIAGNOSIS — I471 Supraventricular tachycardia: Secondary | ICD-10-CM

## 2021-04-25 DIAGNOSIS — I5032 Chronic diastolic (congestive) heart failure: Secondary | ICD-10-CM

## 2021-04-25 DIAGNOSIS — I499 Cardiac arrhythmia, unspecified: Secondary | ICD-10-CM | POA: Diagnosis not present

## 2021-04-25 NOTE — Patient Instructions (Addendum)
DR. Mayford Knife RECOMMENDS THAT YOU CUT BACK ON YOUR CAFFEINE INTAKE.   Medication Instructions:  Your physician recommends that you continue on your current medications as directed. Please refer to the Current Medication list given to you today.  *If you need a refill on your cardiac medications before your next appointment, please call your pharmacy*  Testing/Procedures: Your physician has recommended that you wear an event monitor. Event monitors are medical devices that record the hearts electrical activity. Doctors most often [place] these monitors to diagnose arrhythmias. Arrhythmias are problems with the speed or rhythm of the heartbeat. The monitor is a small, portable device. You can wear one while you do your normal daily activities. This is usually used to diagnose what is causing palpitations/syncope (passing out).  Follow-Up: At [facility], you and your health needs are our priority.  As part of our continuing mission to provide you with exceptional heart care, we have created designated Provider Care Teams.  These Care Teams include your primary Cardiologist (physician) and Advanced Practice Providers (APPs -  Physician Assistants and Nurse Practitioners) who all work together to provide you with the care you need, when you need it.  Your next appointment:   3 week(s)  The format for your next appointment:   In Person  Provider:   Lesleigh Noe, MD    Other Instructions Ann Rodriguez- Long Term Monitor Instructions  Your physician has requested you wear a ZIO patch monitor for 14 days.  This is a single patch monitor. [employer] supplies one patch monitor per enrollment. Additional stickers are not available. Please do not apply patch if you will be having a Nuclear Stress Test,  Echocardiogram, Cardiac CT, MRI, or Chest Xray during the period you would be wearing the  monitor. The patch cannot be worn during these tests. You cannot remove and re-apply the  ZIO XT patch monitor.   Your ZIO patch monitor will be mailed 3 day USPS to your address on file. It may take 3-5 days  to receive your monitor after you have been enrolled.  Once you have received your monitor, please review the enclosed instructions. Your monitor  has already been registered assigning a specific monitor serial # to you.  Billing and Patient Assistance Program Information  We have supplied [employer] with any of your insurance information on file for billing purposes. [employer] offers a sliding scale Patient Assistance Program for patients that do not have  insurance, or whose insurance does not completely cover the cost of the ZIO monitor.  You must apply for the Patient Assistance Program to qualify for this discounted rate.  To apply, please call [employer] at [phone], select option 4, select option 2, ask to apply for  Patient Assistance Program. Ann Rodriguez will ask your household income, and how many people  are in your household. They will quote your out-of-pocket cost based on that information.  [employer] will also be able to set up a 56-month, interest-free payment plan if needed.  Applying the monitor   Shave hair from upper left chest.  Hold abrader disc by orange tab. Rub abrader in 40 strokes over the upper left chest as  indicated in your monitor instructions.  Clean area with 4 enclosed alcohol pads. Let dry.  Apply patch as indicated in monitor instructions. Patch will be placed under collarbone on left  side of chest with arrow pointing upward.  Rub patch adhesive wings for 2 minutes. Remove white label marked "1". Remove the white  label marked "2". Rub patch adhesive wings for 2 additional minutes.  While looking in a mirror, press and release button in center of patch. A small green light will  flash 3-4 times. This will be your only indicator that the monitor has been turned on.  Do not shower for the first 24 hours. You may shower after the first 24 hours.  Press the button if  you feel a symptom. You will hear a small click. Record Date, Time and  Symptom in the Patient Logbook.  When you are ready to remove the patch, follow instructions on the last 2 pages of Patient  Logbook. Stick patch monitor onto the last page of Patient Logbook.  Place Patient Logbook in the blue and white box. Use locking tab on box and tape box closed  securely. The blue and white box has prepaid postage on it. Please place it in the mailbox as  soon as possible. Your physician should have your test results approximately 7 days after the  monitor has been mailed back to [facility].  Call [facility] Customer Care at [phone] if you have questions regarding  your ZIO XT patch monitor. Call them immediately if you see an orange light blinking on your  monitor.  If your monitor falls off in less than 4 days, contact our Monitor department at [phone].  If your monitor becomes loose or falls off after 4 days call [employer] at [phone] for  suggestions on securing your monitor

## 2021-04-25 NOTE — Addendum Note (Signed)
Addended by: Antonieta Iba on: 04/25/2021 11:23 AM   Modules accepted: Orders

## 2021-04-25 NOTE — Progress Notes (Unsigned)
Enrolled for [employer] to mail a ZIO XT long term holter monitor to the patients address on file.  

## 2021-04-25 NOTE — Progress Notes (Signed)
Cardiology Office Note   Date:  04/25/2021   ID:  Ann Rodriguez, DOB Mar 05, 1929, MRN [id number]  PCP:  Tally Joe, MD  Cardiologist: Dr. Verdis Prime, MD  Chief Complaint  Patient presents with   Palpitations   Shortness of Breath      History of Present Illness: Ann Rodriguez is a 88 y.o. female who presents for follow-up, seen by Dr. Katrinka Blazing.  Ann Rodriguez has a history of a SVT s/p ablation 2014/07/29, possible PAF remote without documentation on EKG, hypertension and diastolic dysfunction.  She was previously on flecainide and anticoagulation however this was discontinued after SVT ablation.   She has a history of shortness of breath felt to be multifactorial including diastolic dysfunction, physical deconditioning and kyphoscoliosis with restrictive pulmonary function.  Recommendations were to increase her physical activity with hopeful return to exertional tolerance.  2D echo 02/26/2020 showed normal LV function with EF 60 to 65% with grade 2 diastolic dysfunction, mild left atrial enlargement and mild to moderate AI.  She is here today because her PCP recently saw her and she told him she was having palpitations but no EKG was done. The palpitations have been occurring daily for about 3 weeks.  She admits to drinking 4 cups of coffee.  She also drinks ice tea.  She also has been having SOB with any activity.  She tells me that the SOB has not changed from when she was seen by Dr. Katrinka Blazing last. She denies any chest pain or pressure, PND, orthopnea, lower extremity edema.  Occasionally she will feel dizzy when she gets the SOB.    Past Medical History:  Diagnosis Date   Arthritis    Bilateral foot-drop 08/31/2020   Depression    PT'S HUSBAND AND SON HAVE DIED W/IN PAST YEAR 2011-07-29   GERD (gastroesophageal reflux disease)    Hearing loss    Hyperlipidemia    Idiopathic peripheral neuropathy 07/18/2016   Nocturnal leg cramps 01/22/2017   Stroke [facility])    SVT (supraventricular tachycardia)  (HCC) 09/2014   Thyroid disease     Past Surgical History:  Procedure Laterality Date   APPENDECTOMY     BACK SURGERY     lumbar   BLADDER SURGERY     BUNIONECTOMY     left and right with hammer toe repair   CARPAL TUNNEL RELEASE Bilateral 07-29-15   ELECTROPHYSIOLOGIC STUDY N/A 11/03/2014   Procedure: SVT Ablation;  Surgeon: Marinus Maw, MD;  Location: [facility] INVASIVE CV LAB;  Service: Cardiovascular;  Laterality: N/A;   EYE SURGERY     bilateral cataract extraction with IOL   FOOT SURGERY     KNEE ARTHROSCOPY  02/09/2011   Procedure: ARTHROSCOPY KNEE;  Surgeon: Jacki Cones;  Location: [facility] ORS;  Service: Orthopedics;  Laterality: Left;  Left Knee Arthroscopy with Menisectomy medial, abrasion chondroplasty medial, and synovectomy, supra patella pouch.   NASAL SINUS SURGERY     OOPHORECTOMY     ROTATOR CUFF REPAIR  1/13,  3/13   x 3  right/   SALPINGECTOMY     SHOULDER OPEN ROTATOR CUFF REPAIR Left 06/26/2012   Procedure: LEFT SHOULDER ROTATOR CUFF REPAIR WITH ANCHORS ;  Surgeon: Jacki Cones, MD;  Location: [facility] ORS;  Service: Orthopedics;  Laterality: Left;   TOTAL KNEE ARTHROPLASTY  09/05/2011   Procedure: TOTAL KNEE ARTHROPLASTY;  Surgeon: Jacki Cones, MD;  Location: [facility];  Service: Orthopedics;  Laterality: Left;     Current  Outpatient Medications  Medication Sig Dispense Refill   acetaminophen (TYLENOL) 500 MG tablet Take 2 tablets (1,000 mg total) by mouth every 6 (six) hours as needed for mild pain or fever.     atorvastatin (LIPITOR) 40 MG tablet Take 40 mg by mouth daily.     beta carotene w/minerals (OCUVITE) tablet Take 1 tablet by mouth daily with breakfast.     BIOTIN PO Take 1 tablet by mouth daily.     calcium carbonate (OS-CAL) 600 MG TABS Take 1,800 mg by mouth daily with breakfast.     cholecalciferol (VITAMIN D) 1000 UNITS tablet Take 1,000 Units by mouth daily.     docusate sodium (COLACE) 100 MG capsule Take 1 capsule (100 mg total) by mouth daily as  needed for mild constipation.     escitalopram (LEXAPRO) 10 MG tablet Take 10 mg by mouth daily.  0   polyethylene glycol (MIRALAX / GLYCOLAX) packet Take 17 g by mouth every other day.     traZODone (DESYREL) 50 MG tablet Take 75 mg by mouth at bedtime as needed for sleep.     vitamin B-12 (CYANOCOBALAMIN) 100 MCG tablet Take 100 mcg by mouth daily.     albuterol (VENTOLIN HFA) 108 (90 Base) MCG/ACT inhaler Inhale 1-2 puffs into the lungs every 4 (four) hours as needed for wheezing or shortness of breath (Take 30 minutes prior to exercise). (Patient not taking: Reported on 04/25/2021) 1 each 6   No current facility-administered medications for this visit.    Allergies:   Patient has no known allergies.    Social History:  The patient  reports that she quit smoking about 62 years ago. Her smoking use included cigarettes. She has a 2.50 pack-year smoking history. She has never used smokeless tobacco. She reports current alcohol use of about 1.0 standard drink per week. She reports that she does not use drugs.   Family History:  The patient's family history includes Diabetes in her mother and son.    ROS:  Please see the history of present illness.   Otherwise, review of systems are positive for none.  All other systems are reviewed and negative.    PHYSICAL EXAM: VS:  BP (!) 142/70    Pulse 78    Ht 5' 5.5" (1.664 m)    Wt 138 lb (62.6 kg)    SpO2 90%    BMI 22.62 kg/m  , BMI Body mass index is 22.62 kg/m.  GEN: Well nourished, well developed in no acute distress HEENT: Normal NECK: No JVD; No carotid bruits LYMPHATICS: No lymphadenopathy CARDIAC:RRR, no murmurs, rubs, gallops RESPIRATORY:  Clear to auscultation without rales, wheezing or rhonchi  ABDOMEN: Soft, non-tender, non-distended MUSCULOSKELETAL:  No edema; No deformity  SKIN: Warm and dry NEUROLOGIC:  Alert and oriented x 3 PSYCHIATRIC:  Normal affect    EKG:  EKG is ordered today and demonstrates NSR with PACs  Recent  Labs: 02/06/2021: ALT 14 02/08/2021: BUN 16; Creatinine, Ser 0.82; Hemoglobin 12.0; Platelets 206; Potassium 4.1; Sodium 138    Lipid Panel    Component Value Date/Time   CHOL 161 11/03/2014 0450   TRIG 130 11/03/2014 0450   HDL 47 11/03/2014 0450   CHOLHDL 3.4 11/03/2014 0450   VLDL 26 11/03/2014 0450   LDLCALC 88 11/03/2014 0450    Wt Readings from Last 3 Encounters:  04/25/21 138 lb (62.6 kg)  02/06/21 132 lb (59.9 kg)  01/23/21 137 lb 3.2 oz (62.2 kg)  Other studies Reviewed: Additional studies/ records that were reviewed today include:  Review of the above records demonstrates:   Echocardiogram 09/29/2014:  - Left ventricle: The cavity size was normal. Systolic function was    normal. The estimated ejection fraction was in the range of 55%    to 60%. Wall motion was normal; there were no regional wall    motion abnormalities. Doppler parameters are consistent with    abnormal left ventricular relaxation (grade 1 diastolic    dysfunction).  - Aortic valve: There was mild regurgitation.  - Mitral valve: Structurally normal valve. There was mild    regurgitation.  - Left atrium: The atrium was mildly dilated.  - Right ventricle: The cavity size was mildly dilated. Wall    thickness was normal.  - Right atrium: The atrium was mildly dilated.  - Tricuspid valve: There was moderate regurgitation.  - Pulmonary arteries: Systolic pressure was within the normal    range. PA peak pressure: 34 mm Hg (S).  - Inferior vena cava: The vessel was dilated. The respirophasic    diameter changes were blunted (< 50%), consistent with elevated    central venous pressure.  - Pericardium, extracardiac: A trivial pericardial effusion was    identified. Features were not consistent with tamponade    physiology.   ASSESSMENT AND PLAN:  1.  Shortness of breath/DOE with history of chronic diastolic CHF: -This is chronic and felt related to a combination of diastolic dysfunction,  physical deacon festooning and kyphoscoliosis with restrictive pulmonary function  -2D echo 02/26/2020 showed normal LV function with grade 2 diastolic dysfunction, normal PA pressures and mild to moderate AI -Her shortness of breath seems to be at baseline  2.  SVT  -s/p ablation  3.  Palpitations -her EKG shows PACs -I will get a 2 week Ziopatch to assess for PAF -I have encouraged her to cut back on the 4 cups of coffee she drinks daily which should help with the palptiations -TSH and K+ were normal earlier this month at PCP office  Current medicines are reviewed at length with the patient today.  The patient does not have concerns regarding medicines.  The following changes have been made:  no change  Labs/ tests ordered today include: Ziopatch  Orders Placed This Encounter  Procedures   EKG 12-Lead    Disposition:   FU with Dr. Katrinka Blazing in 3 weeks  Signed, Armanda Magic, MD  04/25/2021 11:16 AM    [facility] [address] [place], [place], [place]  [address] Phone: [phone]; Fax: [phone]

## 2021-04-28 DIAGNOSIS — I5032 Chronic diastolic (congestive) heart failure: Secondary | ICD-10-CM | POA: Diagnosis not present

## 2021-04-28 DIAGNOSIS — I471 Supraventricular tachycardia: Secondary | ICD-10-CM

## 2021-04-28 DIAGNOSIS — R002 Palpitations: Secondary | ICD-10-CM | POA: Diagnosis not present

## 2021-04-28 DIAGNOSIS — I499 Cardiac arrhythmia, unspecified: Secondary | ICD-10-CM

## 2021-05-30 NOTE — Progress Notes (Signed)
?Cardiology Office Note:   ? ?Date:  05/31/2021  ? ?ID:  Ann Rodriguez, DOB Feb 26, 1929, MRN [id number] ? ?PCP:  Tally Joe, MD  ?Cardiologist:  Lesleigh Noe, MD  ? ?Referring MD: Tally Joe, MD  ? ?Chief Complaint  ?Patient presents with  ? Congestive Heart Failure  ?  Shortness of breath  ? Hospitalization Follow-up  ?  SVT  ? ? ?History of Present Illness:   ? ?Ann Rodriguez is a 88 y.o. female with a hx of SVT, DOE, CVA, hyperlipidemia, and recent complaints of increased palpitations with a 30-day monitor performed by Dr. Armanda Magic who saw her in my stead. ? ? ?Seen by Dr. Mayford Knife in February.  Increase palpitations related to a 1 month monitor.  Result is noted below. ? ?Her major complaint is dyspnea on exertion.  There is no orthopnea.  Activities such as walking, combing her hair, cooking, and doing house chores because significant shortness of breath. ? ?Past Medical History:  ?Diagnosis Date  ? Arthritis   ? Bilateral foot-drop 08/31/2020  ? Depression   ? PT'S HUSBAND AND SON HAVE DIED W/IN PAST YEAR 07/06/2011  ? GERD (gastroesophageal reflux disease)   ? Hearing loss   ? Hyperlipidemia   ? Idiopathic peripheral neuropathy 07/18/2016  ? Nocturnal leg cramps 01/22/2017  ? Stroke [facility])   ? SVT (supraventricular tachycardia) (HCC) 09/2014  ? Thyroid disease   ? ? ?Past Surgical History:  ?Procedure Laterality Date  ? APPENDECTOMY    ? BACK SURGERY    ? lumbar  ? BLADDER SURGERY    ? BUNIONECTOMY    ? left and right with hammer toe repair  ? CARPAL TUNNEL RELEASE Bilateral Jul 06, 2015  ? ELECTROPHYSIOLOGIC STUDY N/A 11/03/2014  ? Procedure: SVT Ablation;  Surgeon: Marinus Maw, MD;  [facility] INVASIVE CV LAB;  Service: Cardiovascular;  Laterality: N/A;  ? EYE SURGERY    ? bilateral cataract extraction with IOL  ? FOOT SURGERY    ? KNEE ARTHROSCOPY  02/09/2011  ? Procedure: ARTHROSCOPY KNEE;  Surgeon: Jacki Cones;  Location: [facility];  Service: Orthopedics;  Laterality: Left;  Left Knee Arthroscopy with  Menisectomy medial, abrasion chondroplasty medial, and synovectomy, supra patella pouch.  ? NASAL SINUS SURGERY    ? OOPHORECTOMY    ? ROTATOR CUFF REPAIR  1/13,  3/13  ? x 3  right/  ? SALPINGECTOMY    ? SHOULDER OPEN ROTATOR CUFF REPAIR Left 06/26/2012  ? Procedure: LEFT SHOULDER ROTATOR CUFF REPAIR WITH ANCHORS ;  Surgeon: Jacki Cones, MD;  Location: [facility];  Service: Orthopedics;  Laterality: Left;  ? TOTAL KNEE ARTHROPLASTY  09/05/2011  ? Procedure: TOTAL KNEE ARTHROPLASTY;  Surgeon: Jacki Cones, MD;  Location: [facility];  Service: Orthopedics;  Laterality: Left;  ? ? ?Current Medications: ?Current Meds  ?Medication Sig  ? acetaminophen (TYLENOL) 500 MG tablet Take 2 tablets (1,000 mg total) by mouth every 6 (six) hours as needed for mild pain or fever.  ? atorvastatin (LIPITOR) 40 MG tablet Take 40 mg by mouth daily.  ? beta carotene w/minerals (OCUVITE) tablet Take 1 tablet by mouth daily with breakfast.  ? BIOTIN PO Take 1 tablet by mouth daily.  ? calcium carbonate (OS-CAL) 600 MG TABS Take 1,800 mg by mouth daily with breakfast.  ? cholecalciferol (VITAMIN D) 1000 UNITS tablet Take 1,000 Units by mouth daily.  ? dapagliflozin propanediol (FARXIGA) 10 MG TABS tablet Take 1 tablet (10  mg total) by mouth daily before breakfast.  ? docusate sodium (COLACE) 100 MG capsule Take 1 capsule (100 mg total) by mouth daily as needed for mild constipation.  ? escitalopram (LEXAPRO) 10 MG tablet Take 10 mg by mouth daily.  ? metoprolol succinate (TOPROL XL) 25 MG 24 hr tablet Take 0.5 tablets (12.5 mg total) by mouth daily.  ? polyethylene glycol (MIRALAX / GLYCOLAX) packet Take 17 g by mouth every other day.  ? traZODone (DESYREL) 50 MG tablet Take 75 mg by mouth at bedtime as needed for sleep.  ? vitamin B-12 (CYANOCOBALAMIN) 100 MCG tablet Take 100 mcg by mouth daily.  ?  ? ?Allergies:   Patient has no known allergies.  ? ?Social History  ? ?Socioeconomic History  ? Marital status: Widowed  ?  Spouse name: Not  on file  ? Number of children: 2  ? Years of education: Not on file  ? Highest education level: Not on file  ?Occupational History  ? Occupation: Retired  ?Tobacco Use  ? Smoking status: Former  ?  Packs/day: 0.50  ?  Years: 5.00  ?  Pack years: 2.50  ?  Types: Cigarettes  ?  Quit date: 02/05/1959  ?  Years since quitting: 62.3  ? Smokeless tobacco: Never  ?Substance and Sexual Activity  ? Alcohol use: Yes  ?  Alcohol/week: 1.0 standard drink  ?  Types: 1 Glasses of wine per week  ?  Comment: socially  ? Drug use: No  ? Sexual activity: Not on file  ?Other Topics Concern  ? Not on file  ?Social History Narrative  ? Lives alone  ? Caffeine use: 4 drinks coffee per day  ? Right-handed  ? ?Social Determinants of Health  ? ?Financial Resource Strain: Not on file  ?Food Insecurity: Not on file  ?Transportation Needs: Not on file  ?Physical Activity: Not on file  ?Stress: Not on file  ?Social Connections: Not on file  ?  ? ?Family History: ?The patient's family history includes Diabetes in her mother and son. ? ?ROS:   ?Please see the history of present illness.    ?Still having palpitations.  Wants to know what the monitor showed.  It had not yet been interpreted by Dr. Mayford Knife so I interpreted the study.  Please see below.  I do not believe there is atrial fib or atrial flutter.  She is having SVT and some junctional tachycardia with retrograde P waves.  4% burden is present over the time frame monitored.  All other systems reviewed and are negative. ? ?EKGs/Labs/Other Studies Reviewed:   ? ?The following studies were reviewed today: ? ? 2D Doppler ECHOCARDIOGRAM 02/26/2020: ?IMPRESSIONS  ? 1. Left ventricular ejection fraction, by estimation, is 60 to 65%. The  ?left ventricle has normal function. The left ventricle has no regional  ?wall motion abnormalities. Left ventricular diastolic parameters are  ?consistent with Grade II diastolic  ?dysfunction (pseudonormalization).  ? 2. Right ventricular systolic function is  normal. The right ventricular  ?size is normal. There is normal pulmonary artery systolic pressure. The  ?estimated right ventricular systolic pressure is 34.8 mmHg.  ? 3. Left atrial size was mildly dilated.  ? 4. The mitral valve is normal in structure. Trivial mitral valve  ?regurgitation. No evidence of mitral stenosis.  ? 5. The aortic valve is grossly normal. There is mild calcification of the  ?aortic valve. There is mild thickening of the aortic valve. Aortic valve  ?regurgitation is mild to  moderate. No aortic stenosis is present.  ? 6. The inferior vena cava is normal in size with greater than 50%  ?respiratory variability, suggesting right atrial pressure of 3 mmHg.  ? ?30-day monitor reported on 05/31/2021: ?Study Highlights ? ?  ?Normal sinus rhythm and sinus bradycardia as a basic underlying rhythm. Heart rate range 48 to 182 bpm. Mean heart rate 70 bpm. ?Frequent supraventricular tachycardia with rates as high as 182 bpm. Burden 4%. ?No atrial fibrillation or atrial flutter noted. ?PACs with burden of 3%, and PVCs with burden of 2.5% ? ?EKG:  EKG not performed today. ? ?Recent Labs: ?02/06/2021: ALT 14 ?02/08/2021: BUN 16; Creatinine, Ser 0.82; Hemoglobin 12.0; Platelets 206; Potassium 4.1; Sodium 138  ?Recent Lipid Panel ?   ?Component Value Date/Time  ? CHOL 161 11/03/2014 0450  ? TRIG 130 11/03/2014 0450  ? HDL 47 11/03/2014 0450  ? CHOLHDL 3.4 11/03/2014 0450  ? VLDL 26 11/03/2014 0450  ? LDLCALC 88 11/03/2014 0450  ? ? ?Physical Exam:   ? ?VS:  BP 112/62   Pulse 68   Ht 5' 5.5" (1.664 m)   Wt 135 lb 9.6 oz (61.5 kg)   SpO2 97%   BMI 22.22 kg/m?    ? ?Wt Readings from Last 3 Encounters:  ?05/31/21 135 lb 9.6 oz (61.5 kg)  ?04/25/21 138 lb (62.6 kg)  ?02/06/21 132 lb (59.9 kg)  ?  ? ?GEN: Slender and appearing younger than stated age.. No acute distress ?HEENT: Normal ?NECK: No JVD. ?LYMPHATICS: No lymphadenopathy ?CARDIAC: No murmur. RRR S4 gallop, or edema. ?VASCULAR:  Normal Pulses. No  bruits. ?RESPIRATORY:  Clear to auscultation without rales, wheezing or rhonchi  ?ABDOMEN: Soft, non-tender, non-distended, No pulsatile mass, ?MUSCULOSKELETAL: No deformity  ?SKIN: Warm and dry ?NEUROLOGIC:  Alert and

## 2021-05-31 ENCOUNTER — Encounter: Payer: Self-pay | Admitting: Interventional Cardiology

## 2021-05-31 ENCOUNTER — Ambulatory Visit: Payer: Medicare Other | Admitting: Interventional Cardiology

## 2021-05-31 ENCOUNTER — Other Ambulatory Visit: Payer: Self-pay

## 2021-05-31 VITALS — BP 112/62 | HR 68 | Ht 65.5 in | Wt 135.6 lb

## 2021-05-31 DIAGNOSIS — I5032 Chronic diastolic (congestive) heart failure: Secondary | ICD-10-CM | POA: Diagnosis not present

## 2021-05-31 DIAGNOSIS — R0609 Other forms of dyspnea: Secondary | ICD-10-CM | POA: Diagnosis not present

## 2021-05-31 DIAGNOSIS — I471 Supraventricular tachycardia: Secondary | ICD-10-CM

## 2021-05-31 MED ORDER — DAPAGLIFLOZIN PROPANEDIOL 10 MG PO TABS: 10.0000 mg | ORAL_TABLET | Freq: Every day | ORAL | 11 refills | Status: DC

## 2021-05-31 MED ORDER — METOPROLOL SUCCINATE ER 25 MG PO TB24: 12.5000 mg | ORAL_TABLET | Freq: Every day | ORAL | 3 refills | Status: DC

## 2021-05-31 NOTE — Patient Instructions (Signed)
Medication Instructions:  ?1) START Farxiga 10mg  once daily ?2) START Metoprolol Succinate 12.5mg  once daily ? ?*If you need a refill on your cardiac medications before your next appointment, please call your pharmacy* ? ? ?Lab Work: ?BMET in 1 month ? ?If you have labs (blood work) drawn today and your tests are completely normal, you will receive your results only by: ?MyChart Message (if you have MyChart) OR ?A paper copy in the mail ?If you have any lab test that is abnormal or we need to change your treatment, we will call you to review the results. ? ? ?Testing/Procedures: ?None ? ? ?Follow-Up: ?At [facility], you and your health needs are our priority.  As part of our continuing mission to provide you with exceptional heart care, we have created designated Provider Care Teams.  These Care Teams include your primary Cardiologist (physician) and Advanced Practice Providers (APPs -  Physician Assistants and Nurse Practitioners) who all work together to provide you with the care you need, when you need it. ? ?We recommend signing up for the patient portal called "MyChart".  Sign up information is provided on this After Visit Summary.  MyChart is used to connect with patients for Virtual Visits (Telemedicine).  Patients are able to view lab/test results, encounter notes, upcoming appointments, etc.  Non-urgent messages can be sent to your provider as well.   ?To learn more about what you can do with MyChart, go to [facility].   ? ?Your next appointment:   ?2 month(s) ? ?The format for your next appointment:   ?In Person ? ?Provider:   ?[email or web address], MD  ? ? ?Other Instructions ?  ?

## 2021-06-07 ENCOUNTER — Telehealth: Payer: Self-pay | Admitting: Interventional Cardiology

## 2021-06-07 NOTE — Telephone Encounter (Signed)
Pt would like to know if she is still supposed to pick up her metoprolol... please advise  ?

## 2021-06-07 NOTE — Telephone Encounter (Signed)
Spoke with pt and inquired about how she's doing on the [place].  States she's had no issues with side effects and feels fine.  BPs are good.  Advised pt to go ahead and start the Metoprolol as discussed and let us know if any issues.  Pt verbalized understanding and was in agreement with plan.  ?

## 2021-06-17 DIAGNOSIS — H6123 Impacted cerumen, bilateral: Secondary | ICD-10-CM | POA: Diagnosis not present

## 2021-06-29 ENCOUNTER — Telehealth: Payer: Self-pay | Admitting: Interventional Cardiology

## 2021-06-29 DIAGNOSIS — E782 Mixed hyperlipidemia: Secondary | ICD-10-CM | POA: Diagnosis not present

## 2021-06-29 DIAGNOSIS — I5032 Chronic diastolic (congestive) heart failure: Secondary | ICD-10-CM | POA: Diagnosis not present

## 2021-06-29 DIAGNOSIS — J449 Chronic obstructive pulmonary disease, unspecified: Secondary | ICD-10-CM | POA: Diagnosis not present

## 2021-06-29 DIAGNOSIS — J4530 Mild persistent asthma, uncomplicated: Secondary | ICD-10-CM | POA: Diagnosis not present

## 2021-06-29 NOTE — Telephone Encounter (Signed)
Spoke with pt and she states she hasn't really seen much improvement in her SOB since starting the [place].  Pt is scheduled to see Dr. Katrinka Blazing in May to f/u on how she is feeling on the Farxiga.  Advised pt to continue medication for now and the hope is by the time she sees Dr. Katrinka Blazing, we see some improvement in dyspnea.  Pt verbalized understanding and was in agreement with plan.  ?

## 2021-06-29 NOTE — Telephone Encounter (Signed)
Pt c/o medication issue: ? ?1. Name of Medication: dapagliflozin propanediol (FARXIGA) 10 MG TABS tablet ? ?2. How are you currently taking this medication (dosage and times per day)? As directed  ? ?3. Are you having a reaction (difficulty breathing--STAT)? no ? ?4. What is your medication issue? Patient reported to PCP that she has not had any improvement with DOE since starting this medication. ? ?Patient's PCP office made initial call. PCP asks we just follow up with patient directly  ?

## 2021-07-05 ENCOUNTER — Other Ambulatory Visit: Payer: Medicare Other | Admitting: *Deleted

## 2021-07-05 DIAGNOSIS — I5032 Chronic diastolic (congestive) heart failure: Secondary | ICD-10-CM

## 2021-07-06 LAB — BASIC METABOLIC PANEL
BUN/Creatinine Ratio: 16 (ref 12–28)
BUN: 11 mg/dL (ref 10–36)
CO2: 24 mmol/L (ref 20–29)
Calcium: 9.5 mg/dL (ref 8.7–10.3)
Chloride: 105 mmol/L (ref 96–106)
Creatinine, Ser: 0.69 mg/dL (ref 0.57–1.00)
Glucose: 85 mg/dL (ref 70–99)
Potassium: 4 mmol/L (ref 3.5–5.2)
Sodium: 146 mmol/L — ABNORMAL HIGH (ref 134–144)
eGFR: 82 mL/min/{1.73_m2} (ref >59)

## 2021-07-21 DIAGNOSIS — M545 Low back pain, unspecified: Secondary | ICD-10-CM | POA: Diagnosis not present

## 2021-07-21 DIAGNOSIS — M7072 Other bursitis of hip, left hip: Secondary | ICD-10-CM | POA: Diagnosis not present

## 2021-07-31 NOTE — Progress Notes (Signed)
Cardiology Office Note:    Date:  08/01/2021   ID:  Ann GoldmannJane S Rodriguez, DOB 11/07/1928, MRN [id number]  PCP:  Tally JoeSwayne, David, MD  Cardiologist:  Lesleigh NoeHenry W Nolah Krenzer III, MD   Referring MD: Tally JoeSwayne, David, MD   No chief complaint on file.   History of Present Illness:    Ann Rodriguez is a 88 y.o. female with a hx of  SVT, DOE, chronic diastolic hypertension, CVA, hyperlipidemia, and palpitations.  She is doing well.  Does not know that her breathing has improved on Farxiga.  She complains that her feet feel numb all the time and she has been diagnosed with peripheral neuropathy.  She has discoloration in her feet.  She does not have increased discomfort with walking.  Feet and hands stay cold.  Past Medical History:  Diagnosis Date   Arthritis    Bilateral foot-drop 08/31/2020   Depression    PT'S HUSBAND AND SON HAVE DIED W/IN PAST YEAR 2013   GERD (gastroesophageal reflux disease)    Hearing loss    Hyperlipidemia    Idiopathic peripheral neuropathy 07/18/2016   Nocturnal leg cramps 01/22/2017   Stroke [facility](HCC)    SVT (supraventricular tachycardia) (HCC) 09/2014   Thyroid disease     Past Surgical History:  Procedure Laterality Date   APPENDECTOMY     BACK SURGERY     lumbar   BLADDER SURGERY     BUNIONECTOMY     left and right with hammer toe repair   CARPAL TUNNEL RELEASE Bilateral 2017   ELECTROPHYSIOLOGIC STUDY N/A 11/03/2014   Procedure: SVT Ablation;  Surgeon: Marinus MawGregg W Taylor, MD;  [facility];  Service: Cardiovascular;  Laterality: N/A;   EYE SURGERY     bilateral cataract extraction with IOL   FOOT SURGERY     KNEE ARTHROSCOPY  02/09/2011   Procedure: ARTHROSCOPY KNEE;  Surgeon: Jacki Conesonald A Gioffre;  Location: [facility];  Service: Orthopedics;  Laterality: Left;  Left Knee Arthroscopy with Menisectomy medial, abrasion chondroplasty medial, and synovectomy, supra patella pouch.   NASAL SINUS SURGERY     OOPHORECTOMY     ROTATOR CUFF REPAIR  1/13,  3/13   x 3   right/   SALPINGECTOMY     SHOULDER OPEN ROTATOR CUFF REPAIR Left 06/26/2012   Procedure: LEFT SHOULDER ROTATOR CUFF REPAIR WITH ANCHORS ;  Surgeon: Jacki Conesonald A Gioffre, MD;  Location: [facility];  Service: Orthopedics;  Laterality: Left;   TOTAL KNEE ARTHROPLASTY  09/05/2011   Procedure: TOTAL KNEE ARTHROPLASTY;  Surgeon: Jacki Conesonald A Gioffre, MD;  Location: [facility];  Service: Orthopedics;  Laterality: Left;    Current Medications: Current Meds  Medication Sig   acetaminophen (TYLENOL) 500 MG tablet Take 2 tablets (1,000 mg total) by mouth every 6 (six) hours as needed for mild pain or fever.   atorvastatin (LIPITOR) 40 MG tablet Take 40 mg by mouth daily.   beta carotene w/minerals (OCUVITE) tablet Take 1 tablet by mouth daily with breakfast.   BIOTIN PO Take 1 tablet by mouth daily.   calcium carbonate (OS-CAL) 600 MG TABS Take 1,800 mg by mouth daily with breakfast.   cholecalciferol (VITAMIN D) 1000 UNITS tablet Take 1,000 Units by mouth daily.   desonide (DESOWEN) 0.05 % cream Apply topically as needed.   docusate sodium (COLACE) 100 MG capsule Take 1 capsule (100 mg total) by mouth daily as needed for mild constipation.   escitalopram (LEXAPRO) 10 MG tablet Take 10 mg by mouth  daily.   metoprolol succinate (TOPROL XL) 25 MG 24 hr tablet Take 0.5 tablets (12.5 mg total) by mouth daily.   polyethylene glycol (MIRALAX / GLYCOLAX) packet Take 17 g by mouth every other day.   traMADol (ULTRAM) 50 MG tablet Take 50 mg by mouth as needed.   traZODone (DESYREL) 50 MG tablet Take 75 mg by mouth at bedtime as needed for sleep.   vitamin B-12 (CYANOCOBALAMIN) 100 MCG tablet Take 100 mcg by mouth daily.   [DISCONTINUED] dapagliflozin propanediol (FARXIGA) 10 MG TABS tablet Take 1 tablet (10 mg total) by mouth daily before breakfast.     Allergies:   Patient has no known allergies.   Social History   Socioeconomic History   Marital status: Widowed    Spouse name: Not on file   Number of children: 2    Years of education: Not on file   Highest education level: Not on file  Occupational History   Occupation: Retired  Tobacco Use   Smoking status: Former    Packs/day: 0.50    Years: 5.00    Pack years: 2.50    Types: Cigarettes    Quit date: 02/05/1959    Years since quitting: 62.5   Smokeless tobacco: Never  Substance and Sexual Activity   Alcohol use: Yes    Alcohol/week: 1.0 standard drink    Types: 1 Glasses of wine per week    Comment: socially   Drug use: No   Sexual activity: Not on file  Other Topics Concern   Not on file  Social History Narrative   Lives alone   Caffeine use: 4 drinks coffee per day   Right-handed   Social Determinants of Health   Financial Resource Strain: Not on file  Food Insecurity: Not on file  Transportation Needs: Not on file  Physical Activity: Not on file  Stress: Not on file  Social Connections: Not on file     Family History: The patient's family history includes Diabetes in her mother and son.  ROS:   Please see the history of present illness.    History of foot drop left side.  Presumed spinal stenosis.  All other systems reviewed and are negative.  EKGs/Labs/Other Studies Reviewed:    The following studies were reviewed today: CONTINUOUS MONITOR 04/25/2021: Study Highlights    Normal sinus rhythm and sinus bradycardia as a basic underlying rhythm. Heart rate range 48 to 182 bpm. Mean heart rate 70 bpm. Frequent supraventricular tachycardia with rates as high as 182 bpm. Burden 4%. No atrial fibrillation or atrial flutter noted. PACs with burden of 3%, and PVCs with burden of 2.5%.  EKG:  EKG not performed  Recent Labs: 02/06/2021: ALT 14 02/08/2021: Hemoglobin 12.0; Platelets 206 07/05/2021: BUN 11; Creatinine, Ser 0.69; Potassium 4.0; Sodium 146  Recent Lipid Panel    Component Value Date/Time   CHOL 161 11/03/2014 0450   TRIG 130 11/03/2014 0450   HDL 47 11/03/2014 0450   CHOLHDL 3.4 11/03/2014 0450   VLDL 26  11/03/2014 0450   LDLCALC 88 11/03/2014 0450    Physical Exam:    VS:  BP (!) 132/58   Pulse 68   Ht 5' 5.5" (1.664 m)   Wt 130 lb 3.2 oz (59.1 kg)   SpO2 97%   BMI 21.34 kg/m     Wt Readings from Last 3 Encounters:  08/01/21 130 lb 3.2 oz (59.1 kg)  05/31/21 135 lb 9.6 oz (61.5 kg)  04/25/21 138 lb (62.6  kg)     GEN: Appears younger than stated age.  Not obese.. No acute distress HEENT: Normal NECK: No JVD. LYMPHATICS: No lymphadenopathy CARDIAC: No murmur. RRR no gallop, or edema. VASCULAR: Absent pulses in both feet both dorsalis pedis and posterior tibial.  Normal radial and carotid pulses. No bruits. RESPIRATORY:  Clear to auscultation without rales, wheezing or rhonchi  ABDOMEN: Soft, non-tender, non-distended, No pulsatile mass, MUSCULOSKELETAL: No deformity  SKIN: Warm and dry NEUROLOGIC:  Alert and oriented x 3 PSYCHIATRIC:  Normal affect   ASSESSMENT:    1. Chronic diastolic heart failure (HCC)   2. Dyspnea on exertion   3. Absent pedal pulses   4. SVT (supraventricular tachycardia) (HCC)   5. Idiopathic peripheral neuropathy    PLAN:    In order of problems listed above:  No improvement in dyspnea on Farxiga.  We will discontinue the medication. Not really a limiting issue. Has this feeling as though her feet are swollen all the time.  They are not.  She does not have pedal pulses.  Rule out coexistent PAD. No significant issues. Symptomatic.  If no PAD is identified, she will return for clinical follow-up in 1 year.  If off Farxiga, her breathing worsens, we can always start the medication back.   Medication Adjustments/Labs and Tests Ordered: Current medicines are reviewed at length with the patient today.  Concerns regarding medicines are outlined above.  Orders Placed This Encounter  Procedures   VAS [place] LOWER EXTREMITY ARTERIAL DUPLEX   No orders of the defined types were placed in this encounter.   Patient Instructions  Medication  Instructions:  Your physician has recommended you make the following change in your medication:   1) STOP Marcelline Deist  *If you need a refill on your cardiac medications before your next appointment, please call your pharmacy*  Lab Work: NONE  Testing/Procedures: Your physician has requested that you have a lower extremity arterial duplex. This test is an ultrasound of the arteries in the legs or arms. It looks at arterial blood flow in the legs and arms. Allow one hour for Lower and Upper Arterial scans. There are no restrictions or special instructions   Follow-Up: At [facility], you and your health needs are our priority.  As part of our continuing mission to provide you with exceptional heart care, we have created designated Provider Care Teams.  These Care Teams include your primary Cardiologist (physician) and Advanced Practice Providers (APPs -  Physician Assistants and Nurse Practitioners) who all work together to provide you with the care you need, when you need it.  Your next appointment:   1 year(s)  The format for your next appointment:   In Person  Provider:   Lesleigh Noe, MD {   Important Information About Sugar         Signed, Lesleigh Noe, MD  08/01/2021 12:23 PM    [facility]

## 2021-08-01 ENCOUNTER — Encounter: Payer: Self-pay | Admitting: Interventional Cardiology

## 2021-08-01 ENCOUNTER — Ambulatory Visit: Payer: Medicare Other | Admitting: Interventional Cardiology

## 2021-08-01 VITALS — BP 132/58 | HR 68 | Ht 65.5 in | Wt 130.2 lb

## 2021-08-01 DIAGNOSIS — R0989 Other specified symptoms and signs involving the circulatory and respiratory systems: Secondary | ICD-10-CM | POA: Diagnosis not present

## 2021-08-01 DIAGNOSIS — I5032 Chronic diastolic (congestive) heart failure: Secondary | ICD-10-CM

## 2021-08-01 DIAGNOSIS — I471 Supraventricular tachycardia: Secondary | ICD-10-CM

## 2021-08-01 DIAGNOSIS — G609 Hereditary and idiopathic neuropathy, unspecified: Secondary | ICD-10-CM

## 2021-08-01 DIAGNOSIS — R0609 Other forms of dyspnea: Secondary | ICD-10-CM | POA: Diagnosis not present

## 2021-08-01 NOTE — Patient Instructions (Signed)
Medication Instructions:  Your physician has recommended you make the following change in your medication:   1) STOP Marcelline Deist  *If you need a refill on your cardiac medications before your next appointment, please call your pharmacy*  Lab Work: NONE  Testing/Procedures: Your physician has requested that you have a lower extremity arterial duplex. This test is an ultrasound of the arteries in the legs or arms. It looks at arterial blood flow in the legs and arms. Allow one hour for Lower and Upper Arterial scans. There are no restrictions or special instructions   Follow-Up: At [facility], you and your health needs are our priority.  As part of our continuing mission to provide you with exceptional heart care, we have created designated Provider Care Teams.  These Care Teams include your primary Cardiologist (physician) and Advanced Practice Providers (APPs -  Physician Assistants and Nurse Practitioners) who all work together to provide you with the care you need, when you need it.  Your next appointment:   1 year(s)  The format for your next appointment:   In Person  Provider:   Lesleigh Noe, MD {   Important Information About Sugar

## 2021-08-02 ENCOUNTER — Other Ambulatory Visit: Payer: Self-pay | Admitting: Interventional Cardiology

## 2021-08-02 DIAGNOSIS — R0989 Other specified symptoms and signs involving the circulatory and respiratory systems: Secondary | ICD-10-CM

## 2021-08-30 DIAGNOSIS — J4530 Mild persistent asthma, uncomplicated: Secondary | ICD-10-CM | POA: Diagnosis not present

## 2021-08-30 DIAGNOSIS — I5032 Chronic diastolic (congestive) heart failure: Secondary | ICD-10-CM | POA: Diagnosis not present

## 2021-08-30 DIAGNOSIS — E782 Mixed hyperlipidemia: Secondary | ICD-10-CM | POA: Diagnosis not present

## 2021-08-30 DIAGNOSIS — K219 Gastro-esophageal reflux disease without esophagitis: Secondary | ICD-10-CM | POA: Diagnosis not present

## 2021-08-31 ENCOUNTER — Ambulatory Visit (HOSPITAL_COMMUNITY)
Admission: RE | Admit: 2021-08-31 | Discharge: 2021-08-31 | Disposition: A | Discharge status: Home / Self Care | Payer: Medicare Other | Source: Ambulatory Visit | Attending: Internal Medicine | Admitting: Internal Medicine

## 2021-08-31 DIAGNOSIS — R0989 Other specified symptoms and signs involving the circulatory and respiratory systems: Secondary | ICD-10-CM

## 2021-08-31 DIAGNOSIS — M545 Low back pain, unspecified: Secondary | ICD-10-CM | POA: Diagnosis not present

## 2021-08-31 DIAGNOSIS — M25511 Pain in right shoulder: Secondary | ICD-10-CM | POA: Diagnosis not present

## 2021-09-22 ENCOUNTER — Telehealth: Payer: Self-pay | Admitting: Interventional Cardiology

## 2021-09-22 NOTE — Telephone Encounter (Signed)
Patient c/o Palpitations:  High priority if patient c/o lightheadedness, shortness of breath, or chest pain  How long have you had palpitations/irregular HR/ Afib? Are you having the symptoms now?  3-4 days more prominent   Are you currently experiencing lightheadedness, SOB or CP? No   Do you have a history of afib (atrial fibrillation) or irregular heart rhythm? Not sure   Have you checked your BP or HR? (document readings if available): no  Are you experiencing any other symptoms? Pt states she feels like she is constantly going to pass out. She states she is very fatigue. She states that she would like to be asap. Pt scheduled an appt with Asa Lente, [place] 10/02/21 at 3:00pm.

## 2021-09-22 NOTE — Telephone Encounter (Signed)
Returned call to patient.  She reports she has had a "racy heart" for a while, but over the last 3-4 days it has been worse.  Patient denies CP, states she does have SOB when heart is racing and when walking from one end of her house to the other. She reports feeling fatigued and as though she is "going to pass out" when the palpitations are present. She states palpitations are relieved with rest but will occasionally come on when resting as well.  Patient confirms she is taking Toprol XL 12.5mg  QD.  She states she was at her son's home this past week and she had "a couple of Fuzzy Navels" when asked if she has been drinking alcoholic beverages lately. Also asked if patient is staying well-hydrated and sleeping well, patient states she is trying to.  No BP or HR readings to report as she was away from her home machine while at her son's home.  Patient has an appointment with Jari Favre, PA on 10/02/21.  Will forward to Dr. Katrinka Blazing to review.

## 2021-09-28 DIAGNOSIS — F419 Anxiety disorder, unspecified: Secondary | ICD-10-CM | POA: Diagnosis not present

## 2021-09-28 DIAGNOSIS — M7662 Achilles tendinitis, left leg: Secondary | ICD-10-CM | POA: Diagnosis not present

## 2021-09-28 DIAGNOSIS — I471 Supraventricular tachycardia: Secondary | ICD-10-CM | POA: Diagnosis not present

## 2021-09-28 DIAGNOSIS — R002 Palpitations: Secondary | ICD-10-CM | POA: Diagnosis not present

## 2021-10-01 NOTE — Progress Notes (Signed)
Office Visit    Patient Name: Ann Rodriguez Date of Encounter: 10/02/2021  PCP:  Ann Joe, MD   [facility]  Cardiologist:  Ann Noe, MD  Advanced Practice Provider:  No care team member to display Electrophysiologist:  None    HPI    Ann Rodriguez is a 88 y.o. female with a hx of SVT, DOE, chronic diastolic hypertension, CVA, hyperlipidemia, palpitations presents today for follow-up for palpitations.   She was last seen 07/2021 by Dr. Katrinka Rodriguez and was doing well at that time.  She was not sure if her breathing had improved on Farxiga.  She was stating that her feet feel numb all the time and she was diagnosed with peripheral neuropathy.  She had discoloration in her feet.  She had not had any discomfort with walking.  Feet and hands stay cold.  Marcelline Deist was discontinued since it had not made a huge improvement in her breathing.  A lower extremity arterial duplex was ordered to rule out PAD.  Today, she states that she has had frequent episodes of pounding in her chest.  She states she gets lightheaded and dizzy at this time.  Usually this has happened 3-4 times in the last couple of weeks.  She says this typically lasts all day and she has no energy.  She has a history of SVT ablation with Dr. Ladona Rodriguez back in 09-Jul-2014.  She states that this feels different than that.  EKG read out atrial fibrillation with premature ventricular complexes and right bundle branch block however, on further evaluation there are in fact P waves present.  She is not in atrial fibrillation.  I discussed her EKG and plan of care with Dr. Katrinka Rodriguez.  He does not feel that she needs a monitor at this time.  Reports no shortness of breath nor dyspnea on exertion. Reports no chest pain, pressure, or tightness. No edema, orthopnea, PND.   Past Medical History    Past Medical History:  Diagnosis Date   Arthritis    Bilateral foot-drop 08/31/2020   Depression    PT'S HUSBAND AND SON HAVE DIED  W/IN PAST YEAR 07-09-2011   GERD (gastroesophageal reflux disease)    Hearing loss    Hyperlipidemia    Idiopathic peripheral neuropathy 07/18/2016   Nocturnal leg cramps 01/22/2017   Stroke [facility])    SVT (supraventricular tachycardia) (HCC) 09/2014   Thyroid disease    Past Surgical History:  Procedure Laterality Date   APPENDECTOMY     BACK SURGERY     lumbar   BLADDER SURGERY     BUNIONECTOMY     left and right with hammer toe repair   CARPAL TUNNEL RELEASE Bilateral Jul 09, 2015   ELECTROPHYSIOLOGIC STUDY N/A 11/03/2014   Procedure: SVT Ablation;  Surgeon: Ann Maw, MD;  Location: [facility] INVASIVE CV LAB;  Service: Cardiovascular;  Laterality: N/A;   EYE SURGERY     bilateral cataract extraction with IOL   FOOT SURGERY     KNEE ARTHROSCOPY  02/09/2011   Procedure: ARTHROSCOPY KNEE;  Surgeon: Ann Rodriguez;  Location: [facility];  Service: Orthopedics;  Laterality: Left;  Left Knee Arthroscopy with Menisectomy medial, abrasion chondroplasty medial, and synovectomy, supra patella pouch.   NASAL SINUS SURGERY     OOPHORECTOMY     ROTATOR CUFF REPAIR  1/13,  3/13   x 3  right/   SALPINGECTOMY     SHOULDER OPEN ROTATOR CUFF REPAIR Left 06/26/2012  Procedure: LEFT SHOULDER ROTATOR CUFF REPAIR WITH ANCHORS ;  Surgeon: Ann Cones, MD;  Location: [facility];  Service: Orthopedics;  Laterality: Left;   TOTAL KNEE ARTHROPLASTY  09/05/2011   Procedure: TOTAL KNEE ARTHROPLASTY;  Surgeon: Ann Cones, MD;  Location: [facility];  Service: Orthopedics;  Laterality: Left;    Allergies  No Known Allergies  EKGs/Labs/Other Studies Reviewed:   The following studies were reviewed today:  ABIs 08/31/21  Summary:  Right: Resting right ankle-brachial index is within normal range. No  evidence of significant right lower extremity arterial disease. The right  toe-brachial index is normal.   Left: Resting left ankle-brachial index is within normal range. No  evidence of significant left lower extremity  arterial disease. The left  toe-brachial index is normal.   EKG:  EKG is  ordered today.  The ekg ordered today demonstrates normal sinus rhythm with PACs and right bundle branch block, rate 67 bpm  Recent Labs: 02/06/2021: ALT 14 02/08/2021: Hemoglobin 12.0; Platelets 206 07/05/2021: BUN 11; Creatinine, Ser 0.69; Potassium 4.0; Sodium 146  Recent Lipid Panel    Component Value Date/Time   CHOL 161 11/03/2014 0450   TRIG 130 11/03/2014 0450   HDL 47 11/03/2014 0450   CHOLHDL 3.4 11/03/2014 0450   VLDL 26 11/03/2014 0450   LDLCALC 88 11/03/2014 0450     Home Medications   Current Meds  Medication Sig   acetaminophen (TYLENOL) 500 MG tablet Take 2 tablets (1,000 mg total) by mouth every 6 (six) hours as needed for mild pain or fever.   atorvastatin (LIPITOR) 40 MG tablet Take 40 mg by mouth daily.   beta carotene w/minerals (OCUVITE) tablet Take 1 tablet by mouth daily with breakfast.   BIOTIN PO Take 1 tablet by mouth daily.   calcium carbonate (OS-CAL) 600 MG TABS Take 1,800 mg by mouth daily with breakfast.   cholecalciferol (VITAMIN D) 1000 UNITS tablet Take 1,000 Units by mouth daily.   desonide (DESOWEN) 0.05 % cream Apply topically as needed.   docusate sodium (COLACE) 100 MG capsule Take 1 capsule (100 mg total) by mouth daily as needed for mild constipation.   escitalopram (LEXAPRO) 10 MG tablet Take 10 mg by mouth daily.   metoprolol succinate (TOPROL XL) 25 MG 24 hr tablet Take 0.5 tablets (12.5 mg total) by mouth daily.   polyethylene glycol (MIRALAX / GLYCOLAX) packet Take 17 g by mouth every other day.   traMADol (ULTRAM) 50 MG tablet Take 50 mg by mouth as needed.   traZODone (DESYREL) 50 MG tablet Take 75 mg by mouth at bedtime as needed for sleep.   vitamin B-12 (CYANOCOBALAMIN) 100 MCG tablet Take 100 mcg by mouth daily.     Review of Systems      All other systems reviewed and are otherwise negative except as noted above.  Physical Exam    VS:  BP 132/74    Pulse 67   Ht 5' 5.5" (1.664 m)   Wt 133 lb (60.3 kg)   SpO2 95%   BMI 21.80 kg/m  , BMI Body mass index is 21.8 kg/m.  Wt Readings from Last 3 Encounters:  10/02/21 133 lb (60.3 kg)  08/01/21 130 lb 3.2 oz (59.1 kg)  05/31/21 135 lb 9.6 oz (61.5 kg)     GEN: Well nourished, well developed, in no acute distress. HEENT: normal. Neck: Supple, no JVD, carotid bruits, or masses. Cardiac: irregular, no murmurs, rubs, or gallops. No clubbing, cyanosis, edema.  Radials/PT 1+ and equal bilaterally.  Respiratory:  Respirations regular and unlabored, clear to auscultation bilaterally. GI: Soft, nontender, nondistended. MS: No deformity or atrophy. Skin: Warm and dry, no rash. Neuro:  Strength and sensation are intact. Psych: Normal affect.  Assessment & Plan    Chronic diastolic heart failure -euvolemic on exam -continue current medication regimen: Lipitor 40 mg daily, metoprolol succinate 12.5 mg daily  Dyspnea on exertion -stable at this time -Exercise is limited by her Achilles tendinitis   Absent pedal pulses (ABIs normal without significant lower ext disease -venous stasis changes present -no PAD   SVT/palpitations -previous SVT ablation with Dr. Ladona Rodriguez -She is having some lightheadedness and dizziness. She doe have some pounding in her chest. Sometimes they can last all day.  -We will get labs including CBC, BMP, TSH, and magnesium -Hold off on monitor for now  Idiopathic peripheral neuropathy -asymptomatic    Disposition: Follow up 2-3 months with Ann Noe, MD or APP.  Signed, Sharlene Dory, PA-C 10/02/2021, 3:54 PM [facility]

## 2021-10-02 ENCOUNTER — Ambulatory Visit: Payer: Medicare Other | Admitting: Physician Assistant

## 2021-10-02 ENCOUNTER — Encounter: Payer: Self-pay | Admitting: Physician Assistant

## 2021-10-02 VITALS — BP 132/74 | HR 67 | Ht 65.5 in | Wt 133.0 lb

## 2021-10-02 DIAGNOSIS — I471 Supraventricular tachycardia, unspecified: Secondary | ICD-10-CM

## 2021-10-02 DIAGNOSIS — G609 Hereditary and idiopathic neuropathy, unspecified: Secondary | ICD-10-CM | POA: Diagnosis not present

## 2021-10-02 DIAGNOSIS — R002 Palpitations: Secondary | ICD-10-CM | POA: Diagnosis not present

## 2021-10-02 DIAGNOSIS — R0609 Other forms of dyspnea: Secondary | ICD-10-CM | POA: Diagnosis not present

## 2021-10-02 DIAGNOSIS — I5032 Chronic diastolic (congestive) heart failure: Secondary | ICD-10-CM

## 2021-10-02 NOTE — Patient Instructions (Addendum)
Medication Instructions:  Your physician recommends that you continue on your current medications as directed. Please refer to the Current Medication list given to you today.  *If you need a refill on your cardiac medications before your next appointment, please call your pharmacy*   Lab Work: Lab work to be done today--CBC, Magnesium, TSH, BMP If you have labs (blood work) drawn today and your tests are completely normal, you will receive your results only by: MyChart Message (if you have MyChart) OR A paper copy in the mail If you have any lab test that is abnormal or we need to change your treatment, we will call you to review the results.   Testing/Procedures: none   Follow-Up: At [facility], you and your health needs are our priority.  As part of our continuing mission to provide you with exceptional heart care, we have created designated Provider Care Teams.  These Care Teams include your primary Cardiologist (physician) and Advanced Practice Providers (APPs -  Physician Assistants and Nurse Practitioners) who all work together to provide you with the care you need, when you need it.  We recommend signing up for the patient portal called "MyChart".  Sign up information is provided on this After Visit Summary.  MyChart is used to connect with patients for Virtual Visits (Telemedicine).  Patients are able to view lab/test results, encounter notes, upcoming appointments, etc.  Non-urgent messages can be sent to your provider as well.   To learn more about what you can do with MyChart, go to [email or web address].    Your next appointment:   December 13, 2021 at 10:40  The format for your next appointment:   In Person  Provider:   Lesleigh Noe, MD     Other Instructions    Important Information About Sugar

## 2021-10-03 LAB — TSH: TSH: 2.98 u[IU]/mL (ref 0.450–4.500)

## 2021-10-03 LAB — BASIC METABOLIC PANEL
BUN/Creatinine Ratio: 22 (ref 12–28)
BUN: 15 mg/dL (ref 10–36)
CO2: 26 mmol/L (ref 20–29)
Calcium: 10.1 mg/dL (ref 8.7–10.3)
Chloride: 103 mmol/L (ref 96–106)
Creatinine, Ser: 0.67 mg/dL (ref 0.57–1.00)
Glucose: 71 mg/dL (ref 70–99)
Potassium: 4.3 mmol/L (ref 3.5–5.2)
Sodium: 142 mmol/L (ref 134–144)
eGFR: 83 mL/min/{1.73_m2} (ref >59)

## 2021-10-03 LAB — CBC
Hematocrit: 42.9 % (ref 34.0–46.6)
Hemoglobin: 13.8 g/dL (ref 11.1–15.9)
MCH: 31 pg (ref 26.6–33.0)
MCHC: 32.2 g/dL (ref 31.5–35.7)
MCV: 96 fL (ref 79–97)
Platelets: 247 10*3/uL (ref 150–450)
RBC: 4.45 x10E6/uL (ref 3.77–5.28)
RDW: 13.1 % (ref 11.7–15.4)
WBC: 7.2 10*3/uL (ref 3.4–10.8)

## 2021-10-03 LAB — MAGNESIUM: Magnesium: 2 mg/dL (ref 1.6–2.3)

## 2021-10-05 DIAGNOSIS — M7662 Achilles tendinitis, left leg: Secondary | ICD-10-CM | POA: Diagnosis not present

## 2021-10-09 DIAGNOSIS — J4530 Mild persistent asthma, uncomplicated: Secondary | ICD-10-CM | POA: Diagnosis not present

## 2021-10-09 DIAGNOSIS — E782 Mixed hyperlipidemia: Secondary | ICD-10-CM | POA: Diagnosis not present

## 2021-10-09 DIAGNOSIS — I5032 Chronic diastolic (congestive) heart failure: Secondary | ICD-10-CM | POA: Diagnosis not present

## 2021-10-18 ENCOUNTER — Other Ambulatory Visit: Payer: Self-pay

## 2021-10-18 ENCOUNTER — Telehealth: Payer: Self-pay | Admitting: Interventional Cardiology

## 2021-10-18 ENCOUNTER — Emergency Department (HOSPITAL_COMMUNITY): Payer: Medicare Other

## 2021-10-18 ENCOUNTER — Observation Stay (HOSPITAL_COMMUNITY)
Admission: EM | Admit: 2021-10-18 | Discharge: 2021-10-19 | Disposition: A | Discharge status: Home / Self Care | Payer: Medicare Other | Attending: Internal Medicine | Admitting: Internal Medicine

## 2021-10-18 ENCOUNTER — Encounter (HOSPITAL_COMMUNITY): Payer: Self-pay | Admitting: Emergency Medicine

## 2021-10-18 DIAGNOSIS — R55 Syncope and collapse: Secondary | ICD-10-CM | POA: Diagnosis not present

## 2021-10-18 DIAGNOSIS — R002 Palpitations: Secondary | ICD-10-CM | POA: Diagnosis present

## 2021-10-18 DIAGNOSIS — Z79899 Other long term (current) drug therapy: Secondary | ICD-10-CM | POA: Insufficient documentation

## 2021-10-18 DIAGNOSIS — I480 Paroxysmal atrial fibrillation: Secondary | ICD-10-CM | POA: Diagnosis not present

## 2021-10-18 DIAGNOSIS — I5032 Chronic diastolic (congestive) heart failure: Secondary | ICD-10-CM | POA: Insufficient documentation

## 2021-10-18 DIAGNOSIS — R0602 Shortness of breath: Secondary | ICD-10-CM | POA: Diagnosis not present

## 2021-10-18 DIAGNOSIS — R000 Tachycardia, unspecified: Secondary | ICD-10-CM | POA: Diagnosis not present

## 2021-10-18 DIAGNOSIS — I951 Orthostatic hypotension: Principal | ICD-10-CM | POA: Insufficient documentation

## 2021-10-18 DIAGNOSIS — Z96652 Presence of left artificial knee joint: Secondary | ICD-10-CM | POA: Insufficient documentation

## 2021-10-18 DIAGNOSIS — Z87891 Personal history of nicotine dependence: Secondary | ICD-10-CM | POA: Diagnosis not present

## 2021-10-18 DIAGNOSIS — R778 Other specified abnormalities of plasma proteins: Secondary | ICD-10-CM | POA: Insufficient documentation

## 2021-10-18 DIAGNOSIS — Z8673 Personal history of transient ischemic attack (TIA), and cerebral infarction without residual deficits: Secondary | ICD-10-CM | POA: Diagnosis not present

## 2021-10-18 DIAGNOSIS — D72829 Elevated white blood cell count, unspecified: Secondary | ICD-10-CM | POA: Insufficient documentation

## 2021-10-18 DIAGNOSIS — I959 Hypotension, unspecified: Secondary | ICD-10-CM | POA: Diagnosis not present

## 2021-10-18 LAB — BASIC METABOLIC PANEL
Anion gap: 10 (ref 5–15)
BUN: 18 mg/dL (ref 8–23)
CO2: 28 mmol/L (ref 22–32)
Calcium: 10.2 mg/dL (ref 8.9–10.3)
Chloride: 102 mmol/L (ref 98–111)
Creatinine, Ser: 0.71 mg/dL (ref 0.44–1.00)
GFR, Estimated: >60 mL/min (ref >60)
Glucose, Bld: 101 mg/dL — ABNORMAL HIGH (ref 70–99)
Potassium: 4.7 mmol/L (ref 3.5–5.1)
Sodium: 140 mmol/L (ref 135–145)

## 2021-10-18 LAB — CBC
HCT: 45.4 % (ref 36.0–46.0)
Hemoglobin: 15 g/dL (ref 12.0–15.0)
MCH: 31.9 pg (ref 26.0–34.0)
MCHC: 33 g/dL (ref 30.0–36.0)
MCV: 96.6 fL (ref 80.0–100.0)
Platelets: 265 10*3/uL (ref 150–400)
RBC: 4.7 MIL/uL (ref 3.87–5.11)
RDW: 14.2 % (ref 11.5–15.5)
WBC: 13.4 10*3/uL — ABNORMAL HIGH (ref 4.0–10.5)
nRBC: 0 % (ref 0.0–0.2)

## 2021-10-18 LAB — TROPONIN I (HIGH SENSITIVITY)
Troponin I (High Sensitivity): 37 ng/L — ABNORMAL HIGH (ref <18)
Troponin I (High Sensitivity): 42 ng/L — ABNORMAL HIGH (ref <18)

## 2021-10-18 LAB — MAGNESIUM: Magnesium: 2 mg/dL (ref 1.7–2.4)

## 2021-10-18 MED ORDER — SODIUM CHLORIDE 0.9 % IV BOLUS
1000.0000 mL | Freq: Once | INTRAVENOUS | Status: AC
  Administered 2021-10-18: 1000 mL via INTRAVENOUS

## 2021-10-18 MED ORDER — SODIUM CHLORIDE 0.9% FLUSH
3.0000 mL | Freq: Two times a day (BID) | INTRAVENOUS | Status: DC
  Administered 2021-10-19: 3 mL via INTRAVENOUS

## 2021-10-18 MED ORDER — TRAZODONE HCL 50 MG PO TABS: 75.0000 mg | ORAL_TABLET | Freq: Every evening | ORAL | Status: DC | PRN

## 2021-10-18 MED ORDER — ATORVASTATIN CALCIUM 40 MG PO TABS
40.0000 mg | ORAL_TABLET | Freq: Every day | ORAL | Status: DC
  Administered 2021-10-19: 40 mg via ORAL
  Filled 2021-10-18: qty 1

## 2021-10-18 MED ORDER — ENOXAPARIN SODIUM 40 MG/0.4ML IJ SOSY
40.0000 mg | PREFILLED_SYRINGE | INTRAMUSCULAR | Status: DC
Start: 2021-10-18 — End: 2021-10-19
  Administered 2021-10-18: 40 mg via SUBCUTANEOUS
  Filled 2021-10-18: qty 0.4

## 2021-10-18 MED ORDER — METOPROLOL SUCCINATE ER 25 MG PO TB24
12.5000 mg | ORAL_TABLET | Freq: Every day | ORAL | Status: DC
  Administered 2021-10-19: 12.5 mg via ORAL
  Filled 2021-10-18 (×2): qty 1

## 2021-10-18 MED ORDER — SENNOSIDES-DOCUSATE SODIUM 8.6-50 MG PO TABS: 1.0000 | ORAL_TABLET | Freq: Every evening | ORAL | Status: DC | PRN

## 2021-10-18 MED ORDER — ACETAMINOPHEN 650 MG RE SUPP: 650.0000 mg | Freq: Four times a day (QID) | RECTAL | Status: DC | PRN

## 2021-10-18 MED ORDER — ACETAMINOPHEN 325 MG PO TABS: 650.0000 mg | ORAL_TABLET | Freq: Four times a day (QID) | ORAL | Status: DC | PRN

## 2021-10-18 MED ORDER — TRAMADOL HCL 50 MG PO TABS: 50.0000 mg | ORAL_TABLET | Freq: Two times a day (BID) | ORAL | Status: DC | PRN

## 2021-10-18 MED ORDER — ESCITALOPRAM OXALATE 10 MG PO TABS
10.0000 mg | ORAL_TABLET | Freq: Every day | ORAL | Status: DC
  Administered 2021-10-19: 10 mg via ORAL
  Filled 2021-10-18: qty 1

## 2021-10-18 NOTE — ED Provider Notes (Signed)
[facility] EMERGENCY DEPARTMENT Provider Note   CSN: [id number] Arrival date & time: 10/18/21  1435     History  Chief Complaint  Patient presents with   Tachycardia    Ann Rodriguez is a 88 y.o. female.  Patient is here with palpitations.  History of A-fib in the past but now post ablation.  Patient states that she is feeling, lightheaded today and had EMS come out of the house and thought that she was in A-fib.  She takes 12.5 mg of metoprolol daily.  Currently now she is feeling really well.  She has no chest pain or shortness of breath.  Palpitations have resolved.  She denies any shortness of breath, abdominal pain.  No nausea or vomiting.  No diarrhea.  The history is provided by the patient.       Home Medications Prior to Admission medications   Medication Sig Start Date End Date Taking? Authorizing Provider  acetaminophen (TYLENOL) 500 MG tablet Take 2 tablets (1,000 mg total) by mouth every 6 (six) hours as needed for mild pain or fever. 02/08/21   Juliet Rude, PA-C  atorvastatin (LIPITOR) 40 MG tablet Take 40 mg by mouth daily.    [provider]  beta carotene w/minerals (OCUVITE) tablet Take 1 tablet by mouth daily with breakfast.    [provider]  BIOTIN PO Take 1 tablet by mouth daily.    [provider]  calcium carbonate (OS-CAL) 600 MG TABS Take 1,800 mg by mouth daily with breakfast.    [provider]  cholecalciferol (VITAMIN D) 1000 UNITS tablet Take 1,000 Units by mouth daily.    [provider]  desonide (DESOWEN) 0.05 % cream Apply topically as needed. 07/20/21   [provider]  docusate sodium (COLACE) 100 MG capsule Take 1 capsule (100 mg total) by mouth daily as needed for mild constipation. 02/08/21   Juliet Rude, PA-C  escitalopram (LEXAPRO) 10 MG tablet Take 10 mg by mouth daily. 04/01/17   [provider]  metoprolol succinate (TOPROL XL) 25 MG 24 hr tablet  Take 0.5 tablets (12.5 mg total) by mouth daily. 05/31/21   Lyn Records, MD  polyethylene glycol [facility]) packet Take 17 g by mouth every other day.    [provider]  traMADol (ULTRAM) 50 MG tablet Take 50 mg by mouth as needed. 07/21/21   [provider]  traZODone (DESYREL) 50 MG tablet Take 75 mg by mouth at bedtime as needed for sleep. 01/08/21   [provider]  vitamin B-12 (CYANOCOBALAMIN) 100 MCG tablet Take 100 mcg by mouth daily.    [provider]      Allergies    Patient has no known allergies.    Review of Systems   Review of Systems  Physical Exam Updated Vital Signs BP 135/88   Pulse 65   Temp (!) 97.5 F (36.4 C)   Resp 15   SpO2 95%  Physical Exam Vitals and nursing note reviewed.  Constitutional:      General: She is not in acute distress.    Appearance: She is well-developed. She is not ill-appearing.  HENT:     Head: Normocephalic and atraumatic.     Nose: Nose normal.     Mouth/Throat:     Mouth: Mucous membranes are moist.  Eyes:     Extraocular Movements: Extraocular movements intact.     Conjunctiva/sclera: Conjunctivae normal.     Pupils:  Pupils are equal, round, and reactive to light.  Cardiovascular:     Rate and Rhythm: Normal rate and regular rhythm.     Pulses: Normal pulses.     Heart sounds: Normal heart sounds. No murmur heard. Pulmonary:     Effort: Pulmonary effort is normal. No respiratory distress.     Breath sounds: Normal breath sounds.  Abdominal:     Palpations: Abdomen is soft.     Tenderness: There is no abdominal tenderness.  Musculoskeletal:        General: No swelling.     Cervical back: Normal range of motion and neck supple.  Skin:    General: Skin is warm and dry.     Capillary Refill: Capillary refill takes less than 2 seconds.  Neurological:     General: No focal deficit present.     Mental Status: She is alert and oriented to person, place, and time.      Cranial Nerves: No cranial nerve deficit.     Sensory: No sensory deficit.     Motor: No weakness.     Coordination: Coordination normal.  Psychiatric:        Mood and Affect: Mood normal.     ED Results / Procedures / Treatments   Labs (all labs ordered are listed, but only abnormal results are displayed) Labs Reviewed  BASIC METABOLIC PANEL - Abnormal; Notable for the following components:      Result Value   Glucose, Bld 101 (*)    All other components within normal limits  CBC - Abnormal; Notable for the following components:   WBC 13.4 (*)    All other components within normal limits  TROPONIN I (HIGH SENSITIVITY) - Abnormal; Notable for the following components:   Troponin I (High Sensitivity) 37 (*)    All other components within normal limits  MAGNESIUM  TROPONIN I (HIGH SENSITIVITY)    EKG EKG Interpretation  Date/Time:  Wednesday October 18 2021 18:45:59 EDT Ventricular Rate:  66 PR Interval:  134 QRS Duration: 105 QT Interval:  383 QTC Calculation: 402 R Axis:   -20 Text Interpretation: Sinus rhythm Atrial premature complexes Borderline left axis deviation Confirmed by Ann Rodriguez ([id number]) on 10/18/2021 7:31:02 PM  Radiology DG Chest 1 View  Result Date: 10/18/2021 CLINICAL DATA:  Shortness of breath. Near syncope over the last few days. Tachycardia. Personal history of atrial fibrillation. EXAM: CHEST  1 VIEW COMPARISON:  Two-view chest x-ray 02/06/2021 FINDINGS: Heart is enlarged. Atherosclerotic calcifications are present at the aortic arch. Lungs are clear. No focal airspace disease is present. Edema or effusion is present. Remote left-sided rib fractures are again noted. Degenerative changes are present both shoulders. Levoconvex curvature is similar the prior exam. Remote spinal augmentation noted in the midthoracic spine. IMPRESSION: 1. Cardiomegaly without failure. 2. No acute cardiopulmonary disease. Electronically Signed   By: Marin Roberts M.D.   On:  10/18/2021 16:21    Procedures Procedures    Medications Ordered in ED Medications  sodium chloride 0.9 % bolus 1,000 mL (has no administration in time range)    ED Course/ Medical Decision Making/ A&P                           Medical Decision Making Amount and/or Complexity of Data Reviewed Labs: ordered.  Risk Decision regarding hospitalization.   PADME ARRIAGA is here with palpitations, near syncope.  History of high cholesterol, stroke, peripheral neuropathy, atrial fibrillation  status post ablation.  Patient arrives with normal vitals.  No fever.  EKG per my review shows sinus rhythm with PACs.  I talked on the phone with Dr. Mayford Knife who also reviewed EKGs here in with EMS and suspicion that these are sinus rhythm with frequent PACs.  Does not appear to have atrial fibrillation or a flutter but ultimately Dr. Mayford Knife believes she should be admitted for telemetry and echocardiogram to get a closer look at what this rhythm could be but seems like PACs of the most likely thing at this time.  She is on 12.5 mg of metoprolol.  Her heart rates are ready in the 60s and increasing metoprolol would be somewhat difficult.  She is asymptomatic now but she has been having intermittent palpitations last several weeks but worse today.  I did orthostatics on the patient. Her blood pressure is 135/88 but when she stands it goes down to 115/80.  She does not get symptomatic.  She could be mildly dehydrated.  Will give fluid bolus.  She has already had a CBC, BMP, troponin done prior to my evaluation.  My review and interpretation of these labs show minor elevation in troponin.  No significant anemia, electrolyte abnormality, kidney injury.  She denies any nausea or vomiting.  She had a chest x-ray that showed no evidence of pneumonia or pneumothorax.  Overall patient here with symptomatic palpitations and some near syncope/orthostatic symptoms.  Per cardiology recommendations we will have her admitted to  medicine for telemetry, echocardiogram and hydration.  This chart was dictated using voice recognition software.  Despite best efforts to proofread,  errors can occur which can change the documentation meaning.         Final Clinical Impression(s) / ED Diagnoses Final diagnoses:  Near syncope  Palpitations  Orthostatic hypotension    Rx / DC Orders ED Discharge Orders     None         Ann Norfolk, DO 10/18/21 1941

## 2021-10-18 NOTE — ED Triage Notes (Signed)
Pt comes in POV for tachycardia. Seen by EMS at her home for same and was told she was in afib  Pt states when she got here she felt like she would "pass out" but has waited until she now feels better.

## 2021-10-18 NOTE — ED Provider Triage Note (Signed)
Emergency Medicine Provider Triage Evaluation Note  Ann Rodriguez , a 88 y.o. female  was evaluated in triage.  Pt complains of near syncope, was at cardiology office they called EMS but she decided to ride via POV. She does not have a prior hx of afib not anticoagulated. Cardiology is unsure wether she has Afib, they have not confirmed this. Prior hx of ablations.  Review of Systems  Positive: Near syncope, chest pain Negative: Sob  Physical Exam  BP 137/83   Pulse 78   Temp (!) 97.5 F (36.4 C)   Resp 16   SpO2 97%  Gen:   Awake, no distress   Resp:  Normal effort  MSK:   Moves extremities without difficulty  Other:    Medical Decision Making  Medically screening exam initiated at 3:49 PM.  Appropriate orders placed.  Ann Rodriguez was informed that the remainder of the evaluation will be completed by another provider, this initial triage assessment does not replace that evaluation, and the importance of remaining in the ED until their evaluation is complete.     Claude Manges, PA-C 10/18/21 1555

## 2021-10-18 NOTE — ED Notes (Signed)
ED TO INPATIENT HANDOFF REPORT    S Name/Age/Gender Ann Rodriguez 88 y.o. female Room/Bed: RESUSC/RESUSC  Code Status : Full Code   Alert and oriented x4 , from home   Triage Complete: Triage complete  Chief Complaint Near syncope [R55]  Triage Note Pt comes in POV for tachycardia. Seen by EMS at her home for same and was told she was in afib  Pt states when she got here she felt like she would "pass out" but has waited until she now feels better.    Allergies : NKDA   Level of Care/Admitting Diagnosis ED Disposition     ED Disposition  Admit   Condition  --   Comment  Hospital Area: [facility] [[id number]]  Level of Care: Telemetry Cardiac [103]  May place patient in observation at [facility] or [facility] if equivalent level of care is available:: Yes  Covid Evaluation: Asymptomatic - no recent exposure (last 10 days) testing not required  Diagnosis: Near syncope [phone]  Admitting Physician: Briscoe Deutscher [[id number]]  Attending Physician: Briscoe Deutscher [[id number]]          B Medical/Surgery History Past Medical History:  Diagnosis Date   Arthritis    Bilateral foot-drop 08/31/2020   Depression    PT'S HUSBAND AND SON HAVE DIED W/IN PAST YEAR Jul 02, 2011   GERD (gastroesophageal reflux disease)    Hearing loss    Hyperlipidemia    Idiopathic peripheral neuropathy 07/18/2016   Nocturnal leg cramps 01/22/2017   Stroke [facility])    SVT (supraventricular tachycardia) (HCC) 09/2014   Thyroid disease    Past Surgical History:  Procedure Laterality Date   APPENDECTOMY     BACK SURGERY     lumbar   BLADDER SURGERY     BUNIONECTOMY     left and right with hammer toe repair   CARPAL TUNNEL RELEASE Bilateral 07-02-15   ELECTROPHYSIOLOGIC STUDY N/A 11/03/2014   Procedure: SVT Ablation;  Surgeon: Marinus Maw, MD;  Location: [facility] INVASIVE CV LAB;  Service: Cardiovascular;  Laterality: N/A;   EYE SURGERY     bilateral cataract extraction with IOL   FOOT  SURGERY     KNEE ARTHROSCOPY  02/09/2011   Procedure: ARTHROSCOPY KNEE;  Surgeon: Jacki Cones;  Location: [facility] ORS;  Service: Orthopedics;  Laterality: Left;  Left Knee Arthroscopy with Menisectomy medial, abrasion chondroplasty medial, and synovectomy, supra patella pouch.   NASAL SINUS SURGERY     OOPHORECTOMY     ROTATOR CUFF REPAIR  1/13,  3/13   x 3  right/   SALPINGECTOMY     SHOULDER OPEN ROTATOR CUFF REPAIR Left 06/26/2012   Procedure: LEFT SHOULDER ROTATOR CUFF REPAIR WITH ANCHORS ;  Surgeon: Jacki Cones, MD;  Location: [facility] ORS;  Service: Orthopedics;  Laterality: Left;   TOTAL KNEE ARTHROPLASTY  09/05/2011   Procedure: TOTAL KNEE ARTHROPLASTY;  Surgeon: Jacki Cones, MD;  Location: [facility] ORS;  Service: Orthopedics;  Laterality: Left;     A IV Location/Drains/Wounds Patient Lines/Drains/Airways Status     Active Line/Drains/Airways     Name Placement date Placement time Site Days   Peripheral IV 10/18/21 18 G Left Antecubital 10/18/21  1943  Antecubital  less than 1            1 Liter bolus IV NS given    Labs/Imaging Results for orders placed or performed during the hospital encounter of 10/18/21 (from the past 48 hour(s))  Basic metabolic  panel     Status: Abnormal   Collection Time: 10/18/21  3:56 PM  Result Value Ref Range   Sodium 140 135 - 145 mmol/L   Potassium 4.7 3.5 - 5.1 mmol/L   Chloride 102 98 - 111 mmol/L   CO2 28 22 - 32 mmol/L   Glucose, Bld 101 (H) 70 - 99 mg/dL    Comment: Glucose reference range applies only to samples taken after fasting for at least 8 hours.   BUN 18 8 - 23 mg/dL   Creatinine, Ser 1.19 0.44 - 1.00 mg/dL   Calcium 14.7 8.9 - 82.9 mg/dL   GFR, Estimated >56 >21 mL/min    Comment: (NOTE) Calculated using the CKD-EPI Creatinine Equation (2021)    Anion gap 10 5 - 15    Comment: Performed at [facility], [address] [place], [place] [address]  CBC     Status: Abnormal   Collection Time: 10/18/21  3:56 PM   Result Value Ref Range   WBC 13.4 (H) 4.0 - 10.5 K/uL   RBC 4.70 3.87 - 5.11 MIL/uL   Hemoglobin 15.0 12.0 - 15.0 g/dL   HCT 78.4 69.6 - 29.5 %   MCV 96.6 80.0 - 100.0 fL   MCH 31.9 26.0 - 34.0 pg   MCHC 33.0 30.0 - 36.0 g/dL   RDW 28.4 13.2 - 44.0 %   Platelets 265 150 - 400 K/uL   nRBC 0.0 0.0 - 0.2 %    Comment: Performed at [facility], [address], [place], [place] [address]  Troponin I (High Sensitivity)     Status: Abnormal   Collection Time: 10/18/21  3:56 PM  Result Value Ref Range   Troponin I (High Sensitivity) 37 (H) <18 ng/L    Comment: (NOTE) Elevated high sensitivity troponin I (hsTnI) values and significant  changes across serial measurements may suggest ACS but many other  chronic and acute conditions are known to elevate hsTnI results.  Refer to the "Links" section for chest pain algorithms and additional  guidance. Performed at [facility], [address], [place], [place] [address]    DG Chest 1 View  Result Date: 10/18/2021 CLINICAL DATA:  Shortness of breath. Near syncope over the last few days. Tachycardia. Personal history of atrial fibrillation. EXAM: CHEST  1 VIEW COMPARISON:  Two-view chest x-ray 02/06/2021 FINDINGS: Heart is enlarged. Atherosclerotic calcifications are present at the aortic arch. Lungs are clear. No focal airspace disease is present. Edema or effusion is present. Remote left-sided rib fractures are again noted. Degenerative changes are present both shoulders. Levoconvex curvature is similar the prior exam. Remote spinal augmentation noted in the midthoracic spine. IMPRESSION: 1. Cardiomegaly without failure. 2. No acute cardiopulmonary disease. Electronically Signed   By: Marin Roberts M.D.   On: 10/18/2021 16:21    Pending Labs Unresulted Labs (From admission, onward)     Start     Ordered   10/18/21 1921  Magnesium  Once,   STAT        10/18/21 1920            Vitals/Pain Today's Vitals   10/18/21  1532 10/18/21 1553 10/18/21 1900 10/18/21 1913  BP: 137/83  135/88   Pulse: 78  65   Resp: 16  15   Temp: (!) 97.5 F (36.4 C)     SpO2: 97%  95%   PainSc:  0-No pain  0-No pain    Isolation Precautions No  active isolations  Medications Medications  sodium chloride 0.9 % bolus 1,000 mL (1,000 mLs Intravenous New Bag/Given 10/18/21 1949)    Mobility : Ambulatory / Low Fall Risk    R Recommendations: See Admitting Provider Note

## 2021-10-18 NOTE — Telephone Encounter (Signed)
Pt c/o BP issue: STAT if pt c/o blurred vision, one-sided weakness or slurred speech  1. What are your last 5 BP readings? 120/78 hr 77  2. Are you having any other symptoms (ex. Dizziness, headache, blurred vision, passed out)? Lightheadedness, when standing feeling she going pass.  3. What is your BP issue?

## 2021-10-18 NOTE — H&P (Signed)
History and Physical    [name] FXT:[id number] DOB: [birth year] DOA: 10/18/2021  PCP: Tally Joe, MD   Patient coming from: Home   Chief Complaint: Lightheaded, feels like about to pass out   HPI: Ann Rodriguez is a pleasant 88 y.o. female with medical history significant for SVT ablation in 14-Jul-2014, chronic diastolic CHF, and insomnia, presenting to the emergency department due to feeling as though she was about to pass out.  Patient has had recurrent episodes of palpitations with lightheadedness over the course of weeks to months and was started on a low-dose metoprolol approximately 6 weeks ago but has not noted any change in her symptoms.  Patient was experiencing some palpitations and lightheadedness this morning after taking a shower, had some improvement when sitting down, but then worsened acutely when she went to the kitchen to make coffee.  She felt as though she was about to pass out but did not fall or lose consciousness.  She was able to make her coffee and some food, but continued to experience palpitations and lightheadedness throughout the day, eventually prompting her to come to the ED for evaluation.  She has mild dyspnea associated with these episodes but denies any chest pain, denies leg swelling, and denies cough, headache, fever, or chills.  ED Course: Upon arrival to the ED, patient is found to be afebrile and saturating well on room air with normal heart rate and stable blood pressure.  EKG features sinus rhythm with PACs and incomplete RBBB.  Chest x-ray negative for acute findings.  Chemistry panel is normal and CBC notable for a leukocytosis to 13,400.  Troponin was elevated to 37.  Patient was given a liter of saline in the ED.  ED physician discussed the case with Dr. Mayford Knife of cardiology who recommended cardiac monitoring overnight and echocardiogram.  Review of Systems:  All other systems reviewed and apart from HPI, are negative.  Past Medical History:  Diagnosis  Date   Arthritis    Bilateral foot-drop 08/31/2020   Depression    PT'S HUSBAND AND SON HAVE DIED W/IN PAST YEAR 07-14-2011   GERD (gastroesophageal reflux disease)    Hearing loss    Hyperlipidemia    Idiopathic peripheral neuropathy 07/18/2016   Nocturnal leg cramps 01/22/2017   Stroke [facility])    SVT (supraventricular tachycardia) (HCC) 09/2014   Thyroid disease     Past Surgical History:  Procedure Laterality Date   APPENDECTOMY     BACK SURGERY     lumbar   BLADDER SURGERY     BUNIONECTOMY     left and right with hammer toe repair   CARPAL TUNNEL RELEASE Bilateral 07/14/2015   ELECTROPHYSIOLOGIC STUDY N/A 11/03/2014   Procedure: SVT Ablation;  Surgeon: Marinus Maw, MD;  Location: [facility] INVASIVE CV LAB;  Service: Cardiovascular;  Laterality: N/A;   EYE SURGERY     bilateral cataract extraction with IOL   FOOT SURGERY     KNEE ARTHROSCOPY  02/09/2011   Procedure: ARTHROSCOPY KNEE;  Surgeon: Jacki Cones;  Location: [facility] ORS;  Service: Orthopedics;  Laterality: Left;  Left Knee Arthroscopy with Menisectomy medial, abrasion chondroplasty medial, and synovectomy, supra patella pouch.   NASAL SINUS SURGERY     OOPHORECTOMY     ROTATOR CUFF REPAIR  1/13,  3/13   x 3  right/   SALPINGECTOMY     SHOULDER OPEN ROTATOR CUFF REPAIR Left 06/26/2012   Procedure: LEFT SHOULDER ROTATOR CUFF REPAIR WITH ANCHORS ;  Surgeon: Jacki Cones, MD;  Location: [facility] ORS;  Service: Orthopedics;  Laterality: Left;   TOTAL KNEE ARTHROPLASTY  09/05/2011   Procedure: TOTAL KNEE ARTHROPLASTY;  Surgeon: Jacki Cones, MD;  Location: [facility] OR[facility];  Service: Orthopedics;  Laterality: Left;    Social History:   reports that she quit smoking about 62 years ago. Her smoking use included cigarettes. She has a 2.50 pack-year smoking history. She has never used smokeless tobacco. She reports current alcohol use of about 1.0 standard drink of alcohol per week. She reports that she does not use drugs.  No Known Allergies  Family  History  Problem Relation Age of Onset   Diabetes Mother    Diabetes Son      Prior to Admission medications   Medication Sig Start Date End Date Taking? Authorizing Provider  acetaminophen (TYLENOL) 500 MG tablet Take 2 tablets (1,000 mg total) by mouth every 6 (six) hours as needed for mild pain or fever. 02/08/21   Juliet Rude, PA-C  atorvastatin (LIPITOR) 40 MG tablet Take 40 mg by mouth daily.    [provider]  beta carotene w/minerals (OCUVITE) tablet Take 1 tablet by mouth daily with breakfast.    [provider]  BIOTIN PO Take 1 tablet by mouth daily.    [provider]  calcium carbonate (OS-CAL) 600 MG TABS Take 1,800 mg by mouth daily with breakfast.    [provider]  cholecalciferol (VITAMIN D) 1000 UNITS tablet Take 1,000 Units by mouth daily.    [provider]  desonide (DESOWEN) 0.05 % cream Apply topically as needed. 07/20/21   [provider]  docusate sodium (COLACE) 100 MG capsule Take 1 capsule (100 mg total) by mouth daily as needed for mild constipation. 02/08/21   Juliet Rude, PA-C  escitalopram (LEXAPRO) 10 MG tablet Take 10 mg by mouth daily. 04/01/17   [provider]  metoprolol succinate (TOPROL XL) 25 MG 24 hr tablet Take 0.5 tablets (12.5 mg total) by mouth daily. 05/31/21   Lyn Records, MD  polyethylene glycol [facility]) packet Take 17 g by mouth every other day.    [provider]  traMADol (ULTRAM) 50 MG tablet Take 50 mg by mouth as needed. 07/21/21   [provider]  traZODone (DESYREL) 50 MG tablet Take 75 mg by mouth at bedtime as needed for sleep. 01/08/21   [provider]  vitamin B-12 (CYANOCOBALAMIN) 100 MCG tablet Take 100 mcg by mouth daily.    [provider]    Physical Exam: Vitals:   10/18/21 1532 10/18/21 1900 10/18/21 1945 10/18/21 2005  BP: 137/83 135/88 112/87 112/76  Pulse: 78 65 64 82  Resp: 16 15 20 16   Temp:  (!) 97.5 F (36.4 C)   (!) 97.2 F (36.2 C)  TempSrc:    Axillary  SpO2: 97% 95% 98% 98%    Constitutional: NAD, calm  Eyes: PERTLA, lids and conjunctivae normal ENMT: Mucous membranes are moist. Posterior pharynx clear of any exudate or lesions.   Neck: supple, no masses  Respiratory: no wheezing, no crackles. No accessory muscle use.  Cardiovascular: S1 & S2 heard, regular rate and rhythm. No extremity edema.   Abdomen: No distension, no tenderness, soft. Bowel sounds active.  Musculoskeletal: no clubbing / cyanosis. No joint deformity upper and lower extremities.   Skin: no significant rashes, lesions, ulcers. Warm, dry, well-perfused. Neurologic: CN 2-12 grossly intact. Moving all extremities. Alert and oriented.  Psychiatric: Very pleasant. Cooperative.    Labs and Imaging on Admission: I have personally reviewed following labs and imaging studies  CBC: Recent Labs  Lab 10/18/21 1556  WBC 13.4*  HGB 15.0  HCT 45.4  MCV 96.6  PLT 265   Basic Metabolic Panel: Recent Labs  Lab 10/18/21 1556  NA 140  K 4.7  CL 102  CO2 28  GLUCOSE 101*  BUN 18  CREATININE 0.71  CALCIUM 10.2   GFR: CrCl cannot be calculated (Unknown ideal weight.). Liver Function Tests: No results for input(s): "AST", "ALT", "ALKPHOS", "BILITOT", "PROT", "ALBUMIN" in the last 168 hours. No results for input(s): "LIPASE", "AMYLASE" in the last 168 hours. No results for input(s): "AMMONIA" in the last 168 hours. Coagulation Profile: No results for input(s): "INR", "PROTIME" in the last 168 hours. Cardiac Enzymes: No results for input(s): "CKTOTAL", "CKMB", "CKMBINDEX", "TROPONINI" in the last 168 hours. BNP (last 3 results) No results for input(s): "PROBNP" in the last 8760 hours. HbA1C: No results for input(s): "HGBA1C" in the last 72 hours. CBG: No results for input(s): "GLUCAP" in the last 168 hours. Lipid Profile: No results for input(s): "CHOL", "HDL", "LDLCALC", "TRIG", "CHOLHDL",  "LDLDIRECT" in the last 72 hours. Thyroid Function Tests: No results for input(s): "TSH", "T4TOTAL", "FREET4", "T3FREE", "THYROIDAB" in the last 72 hours. Anemia Panel: No results for input(s): "VITAMINB12", "FOLATE", "FERRITIN", "TIBC", "IRON", "RETICCTPCT" in the last 72 hours. Urine analysis:    Component Value Date/Time   COLORURINE YELLOW 06/18/2012 1000   APPEARANCEUR CLEAR 06/18/2012 1000   LABSPEC 1.022 06/18/2012 1000   PHURINE 7.5 06/18/2012 1000   GLUCOSEU NEGATIVE 06/18/2012 1000   HGBUR NEGATIVE 06/18/2012 1000   BILIRUBINUR NEGATIVE 06/18/2012 1000   KETONESUR NEGATIVE 06/18/2012 1000   PROTEINUR NEGATIVE 06/18/2012 1000   UROBILINOGEN 0.2 06/18/2012 1000   NITRITE NEGATIVE 06/18/2012 1000   LEUKOCYTESUR MODERATE (A) 06/18/2012 1000   Sepsis Labs: @LABRCNTIP (procalcitonin:4,lacticidven:4) )No results found for this or any previous visit (from the past 240 hour(s)).   Radiological Exams on Admission: DG Chest 1 View  Result Date: 10/18/2021 CLINICAL DATA:  Shortness of breath. Near syncope over the last few days. Tachycardia. Personal history of atrial fibrillation. EXAM: CHEST  1 VIEW COMPARISON:  Two-view chest x-ray 02/06/2021 FINDINGS: Heart is enlarged. Atherosclerotic calcifications are present at the aortic arch. Lungs are clear. No focal airspace disease is present. Edema or effusion is present. Remote left-sided rib fractures are again noted. Degenerative changes are present both shoulders. Levoconvex curvature is similar the prior exam. Remote spinal augmentation noted in the midthoracic spine. IMPRESSION: 1. Cardiomegaly without failure. 2. No acute cardiopulmonary disease. Electronically Signed   By: 02/08/2021 M.D.   On: 10/18/2021 16:21    EKG: Independently reviewed. SR with PACs, incomplete RBBB.   Assessment/Plan   1. Near-syncope  - Presents with palpitations, lightheadedness, and near-syncope  - Symptoms are worse when standing and there  was 20 mmHg drop in SBP from 135-115 on standing but she is also having palpitations and lightheadedness when seated  - Continue cardiac monitoring, update echocardiogram, repeat orthostatic vitals in am after IVF    2. Chronic diastolic CHF  - Appears compensated   - Updating echo, continue beta-blocker    3. Elevated troponin  - Troponin slightly elevated in ED without chest pain  - Low-suspicion for ACS, follow-up repeat troponin and echo    DVT prophylaxis: Lovenox  Code Status: Full, discussed with patient and her family on admission  Level  of Care: Level of care: Telemetry Cardiac Family Communication: Daughter and daughter-in-law at bedside  Disposition Plan:  Patient is from: home  Anticipated d/c is to: Home  Anticipated d/c date is: 10/19/21  Patient currently: Pending cardiac monitoring, echo, and orthostatic vitals  Consults called: None Admission status: Observation     Briscoe Deutscher, MD [facility]  10/18/2021, 8:37 PM

## 2021-10-18 NOTE — Telephone Encounter (Signed)
Returned call and spoke with patient's daughter Eunice Blase who reports EMS is with the patient now and have recommended she go to ED to be evaluated for a-fib.  Debbie reports patient has had repeated episode of lightheadedness and feeling as though she is going to pass out when she stands, family called EMS and EKG performed with concern for a-fib.  Eunice Blase states she will be bringing patient to [facility] ED by private car today to be evaluated.

## 2021-10-19 ENCOUNTER — Other Ambulatory Visit: Payer: Self-pay | Admitting: Physician Assistant

## 2021-10-19 ENCOUNTER — Observation Stay (HOSPITAL_BASED_OUTPATIENT_CLINIC_OR_DEPARTMENT_OTHER)
Admit: 2021-10-19 | Discharge: 2021-10-19 | Disposition: A | Discharge status: Home / Self Care | Payer: Medicare Other | Attending: Physician Assistant | Admitting: Physician Assistant

## 2021-10-19 ENCOUNTER — Observation Stay (HOSPITAL_BASED_OUTPATIENT_CLINIC_OR_DEPARTMENT_OTHER): Payer: Medicare Other

## 2021-10-19 DIAGNOSIS — R55 Syncope and collapse: Secondary | ICD-10-CM

## 2021-10-19 DIAGNOSIS — R002 Palpitations: Secondary | ICD-10-CM

## 2021-10-19 DIAGNOSIS — R778 Other specified abnormalities of plasma proteins: Secondary | ICD-10-CM | POA: Diagnosis not present

## 2021-10-19 LAB — ECHOCARDIOGRAM COMPLETE
AV Vena cont: 0.3 cm
Area-P 1/2: 3.48 cm2
Calc EF: 65.4 %
P 1/2 time: 520 msec
S' Lateral: 3.1 cm
Single Plane A2C EF: 65 %
Single Plane A4C EF: 63.4 %

## 2021-10-19 LAB — BASIC METABOLIC PANEL
Anion gap: 5 (ref 5–15)
BUN: 18 mg/dL (ref 8–23)
CO2: 29 mmol/L (ref 22–32)
Calcium: 9 mg/dL (ref 8.9–10.3)
Chloride: 105 mmol/L (ref 98–111)
Creatinine, Ser: 0.75 mg/dL (ref 0.44–1.00)
GFR, Estimated: >60 mL/min (ref >60)
Glucose, Bld: 92 mg/dL (ref 70–99)
Potassium: 4.4 mmol/L (ref 3.5–5.1)
Sodium: 139 mmol/L (ref 135–145)

## 2021-10-19 LAB — CBG MONITORING, ED: Glucose-Capillary: 88 mg/dL (ref 70–99)

## 2021-10-19 LAB — CBC
HCT: 40.1 % (ref 36.0–46.0)
Hemoglobin: 13.1 g/dL (ref 12.0–15.0)
MCH: 32 pg (ref 26.0–34.0)
MCHC: 32.7 g/dL (ref 30.0–36.0)
MCV: 97.8 fL (ref 80.0–100.0)
Platelets: 205 10*3/uL (ref 150–400)
RBC: 4.1 MIL/uL (ref 3.87–5.11)
RDW: 14.3 % (ref 11.5–15.5)
WBC: 9.5 10*3/uL (ref 4.0–10.5)
nRBC: 0 % (ref 0.0–0.2)

## 2021-10-19 NOTE — Discharge Summary (Signed)
Physician Discharge Summary  Ann GoldmannJane S Rodriguez ZOX:[id number] DOB: 03/21/1928 DOA: 10/18/2021  PCP: Tally JoeSwayne, David, MD  Admit date: 10/18/2021 Discharge date: 10/19/2021  Admitted From: Home Disposition:  home  Recommendations for Outpatient Follow-up:  Follow up with PCP in 1-2 weeks Follow up with cardiology  Home Health:None  Equipment/Devices:None  Discharge Condition:Stable  CODE STATUS:Full  Diet recommendation: Low salt/low fat diet    Brief/Interim Summary: Ann Rodriguez is a pleasant 88 y.o. female with medical history significant for SVT ablation in 2016, chronic diastolic CHF, and insomnia, presenting to the emergency department due to feeling as though she was about to pass out.  Patient has had recurrent episodes of palpitations with lightheadedness over the course of weeks to months and was started on a low-dose metoprolol approximately 6 weeks ago but has not noted any change in her symptoms.  Patient admitted as above for near syncope with concurrent palpitations for some time, cardiology following along recommending echocardiogram in-house which was found to be unremarkable.  Ultimately patient will have ambulatory heart monitor placed for closer monitoring with outpatient follow-up with cardiology.  Otherwise stable and agreeable for discharge, no medication changes during hospitalization.  Discharge Diagnoses:  Principal Problem:   Near syncope Active Problems:   Chronic diastolic heart failure (HCC)   Elevated troponin   Discharge Instructions   Allergies as of 10/19/2021   No Known Allergies      Medication List     STOP taking these medications    docusate sodium 100 MG capsule Commonly known as: COLACE       TAKE these medications    acetaminophen 500 MG tablet Commonly known as: TYLENOL Take 2 tablets (1,000 mg total) by mouth every 6 (six) hours as needed for mild pain or fever.   atorvastatin 40 MG tablet Commonly known as: LIPITOR Take 40 mg  by mouth daily.   beta carotene w/minerals tablet Take 1 tablet by mouth daily with breakfast.   BIOTIN PO Take 1 tablet by mouth daily.   cholecalciferol 1000 units tablet Commonly known as: VITAMIN D Take 1,000 Units by mouth daily.   cyanocobalamin 100 MCG tablet Commonly known as: VITAMIN B12 Take 100 mcg by mouth daily.   escitalopram 10 MG tablet Commonly known as: LEXAPRO Take 10 mg by mouth daily.   metoprolol succinate 25 MG 24 hr tablet Commonly known as: Toprol XL Take 0.5 tablets (12.5 mg total) by mouth daily.   polyethylene glycol 17 g packet Commonly known as: MIRALAX / GLYCOLAX Take 17 g by mouth daily.   traMADol 50 MG tablet Commonly known as: ULTRAM Take 50 mg by mouth as needed for moderate pain.   traZODone 50 MG tablet Commonly known as: DESYREL Take 75 mg by mouth at bedtime as needed for sleep.        Follow-up Information     Lyn RecordsSmith, Henry W, MD Follow up on 12/13/2021.   Specialty: Cardiology Why: 10:40AM. Cardiology visit. Contact information: [address] [place] [address] [phone]                No Known Allergies  Consultations: Cardiology  Procedures/Studies: ECHOCARDIOGRAM COMPLETE  Result Date: 10/19/2021    ECHOCARDIOGRAM REPORT   Patient Name:   Ann Rodriguez Date of Exam: 10/19/2021 Medical Rec #:  [id number]     Height:       65.5 in Accession #:    [id number]    Weight:  133.0 lb Date of Birth:  [birth year]      BSA:          1.673 m Patient Age:    88 years      BP:           123/80 mmHg Patient Gender: F             HR:           86 bpm. Exam Location:  Inpatient Procedure: 2D Echo, Cardiac Doppler and Color Doppler Indications:    R55 Syncope  History:        Patient has prior history of Echocardiogram examinations, most                 recent 02/27/2020. Stroke, Signs/Symptoms:Syncope; Risk                 Factors:Dyslipidemia and GERD.  Sonographer:    Eulah Pont RDCS Referring  Phys: [id number] TIMOTHY S OPYD IMPRESSIONS  1. Left ventricular ejection fraction, by estimation, is 60 to 65%. The left ventricle has normal function. The left ventricle has no regional wall motion abnormalities. Left ventricular diastolic parameters are consistent with Grade I diastolic dysfunction (impaired relaxation).  2. Right ventricular systolic function is mildly reduced. The right ventricular size is mildly enlarged. There is mildly elevated pulmonary artery systolic pressure.  3. Left atrial size was mildly dilated.  4. The mitral valve is normal in structure. Mild mitral valve regurgitation. No evidence of mitral stenosis.  5. Tricuspid valve regurgitation is mild to moderate.  6. The aortic valve is grossly normal. There is mild calcification of the aortic valve. Aortic valve regurgitation is mild. No aortic stenosis is present.  7. The inferior vena cava is normal in size with greater than 50% respiratory variability, suggesting right atrial pressure of 3 mmHg. FINDINGS  Left Ventricle: Left ventricular ejection fraction, by estimation, is 60 to 65%. The left ventricle has normal function. The left ventricle has no regional wall motion abnormalities. The left ventricular internal cavity size was normal in size. There is  no left ventricular hypertrophy. Left ventricular diastolic parameters are consistent with Grade I diastolic dysfunction (impaired relaxation). Right Ventricle: The right ventricular size is mildly enlarged. No increase in right ventricular wall thickness. Right ventricular systolic function is mildly reduced. There is mildly elevated pulmonary artery systolic pressure. The tricuspid regurgitant  velocity is 2.93 m/s, and with an assumed right atrial pressure of 3 mmHg, the estimated right ventricular systolic pressure is 37.3 mmHg. Left Atrium: Left atrial size was mildly dilated. Right Atrium: Right atrial size was normal in size. Pericardium: Trivial pericardial effusion is present.  Mitral Valve: The mitral valve is normal in structure. Mild mitral valve regurgitation. No evidence of mitral valve stenosis. Tricuspid Valve: The tricuspid valve is normal in structure. Tricuspid valve regurgitation is mild to moderate. No evidence of tricuspid stenosis. Aortic Valve: The aortic valve is grossly normal. There is mild calcification of the aortic valve. Aortic valve regurgitation is mild. Aortic regurgitation PHT measures 520 msec. No aortic stenosis is present. Pulmonic Valve: The pulmonic valve was normal in structure. Pulmonic valve regurgitation is trivial. No evidence of pulmonic stenosis. Aorta: The aortic root is normal in size and structure. Ascending aorta measurements are within normal limits for age when indexed to body surface area. Venous: The inferior vena cava is normal in size with greater than 50% respiratory variability, suggesting right atrial pressure of 3 mmHg. IAS/Shunts: No atrial level shunt  detected by color flow Doppler.  LEFT VENTRICLE PLAX 2D LVIDd:         4.60 cm     Diastology LVIDs:         3.10 cm     LV e' medial:    3.89 cm/s LV PW:         0.90 cm     LV E/e' medial:  12.8 LV IVS:        0.90 cm     LV e' lateral:   6.89 cm/s LVOT diam:     2.00 cm     LV E/e' lateral: 7.2 LV SV:         51 LV SV Index:   30 LVOT Area:     3.14 cm  LV Volumes (MOD) LV vol d, MOD A2C: 77.4 ml LV vol d, MOD A4C: 51.3 ml LV vol s, MOD A2C: 27.1 ml LV vol s, MOD A4C: 18.8 ml LV SV MOD A2C:     50.3 ml LV SV MOD A4C:     51.3 ml LV SV MOD BP:      43.2 ml RIGHT VENTRICLE RV S prime:     10.40 cm/s TAPSE (M-mode): 1.5 cm LEFT ATRIUM             Index        RIGHT ATRIUM           Index LA diam:        4.10 cm 2.45 cm/m   RA Area:     10.50 cm LA Vol (A2C):   55.6 ml 33.24 ml/m  RA Volume:   19.30 ml  11.54 ml/m LA Vol (A4C):   45.0 ml 26.90 ml/m LA Biplane Vol: 50.6 ml 30.25 ml/m  AORTIC VALVE LVOT Vmax:         91.65 cm/s LVOT Vmean:        56.300 cm/s LVOT VTI:          0.162 m  AI PHT:            520 msec AR Vena Contracta: 0.30 cm  AORTA Ao Root diam: 3.30 cm Ao Asc diam:  3.70 cm MITRAL VALVE               TRICUSPID VALVE MV Area (PHT): 3.48 cm    TR Peak grad:   34.3 mmHg MV Decel Time: 218 msec    TR Vmax:        293.00 cm/s MV E velocity: 49.90 cm/s MV A velocity: 55.60 cm/s  SHUNTS MV E/A ratio:  0.90        Systemic VTI:  0.16 m                            Systemic Diam: 2.00 cm Weston Brass MD Electronically signed by Weston Brass MD Signature Date/Time: 10/19/2021/4:32:59 PM    Final    DG Chest 1 View  Result Date: 10/18/2021 CLINICAL DATA:  Shortness of breath. Near syncope over the last few days. Tachycardia. Personal history of atrial fibrillation. EXAM: CHEST  1 VIEW COMPARISON:  Two-view chest x-ray 02/06/2021 FINDINGS: Heart is enlarged. Atherosclerotic calcifications are present at the aortic arch. Lungs are clear. No focal airspace disease is present. Edema or effusion is present. Remote left-sided rib fractures are again noted. Degenerative changes are present both shoulders. Levoconvex curvature is similar the prior exam. Remote spinal augmentation noted in the midthoracic  spine. IMPRESSION: 1. Cardiomegaly without failure. 2. No acute cardiopulmonary disease. Electronically Signed   By: Marin Roberts M.D.   On: 10/18/2021 16:21     Subjective: No acute issues or events overnight denies nausea vomiting diarrhea constipation headache fevers chills or chest pain, transient episodes of palpitations ongoing.   Discharge Exam: Vitals:   10/19/21 1737 10/19/21 1738  BP: 125/73   Pulse: 86   Resp: 17   Temp:  98.6 F (37 C)  SpO2: 97%    Vitals:   10/19/21 1158 10/19/21 1400 10/19/21 1737 10/19/21 1738  BP:  132/79 125/73   Pulse:  66 86   Resp:  19 17   Temp: 98 F (36.7 C)   98.6 F (37 C)  TempSrc: Oral     SpO2:  95% 97%     General: Pt is alert, awake, not in acute distress Cardiovascular: RRR, S1/S2 +, no rubs, no  gallops Respiratory: CTA bilaterally, no wheezing, no rhonchi Abdominal: Soft, NT, ND, bowel sounds + Extremities: no edema, no cyanosis    The results of significant diagnostics from this hospitalization (including imaging, microbiology, ancillary and laboratory) are listed below for reference.     Microbiology: No results found for this or any previous visit (from the past 240 hour(s)).   Labs: BNP (last 3 results) No results for input(s): "BNP" in the last 8760 hours. Basic Metabolic Panel: Recent Labs  Lab 10/18/21 1556 10/18/21 1940 10/19/21 0355  NA 140  --  139  K 4.7  --  4.4  CL 102  --  105  CO2 28  --  29  GLUCOSE 101*  --  92  BUN 18  --  18  CREATININE 0.71  --  0.75  CALCIUM 10.2  --  9.0  MG  --  2.0  --    Liver Function Tests: No results for input(s): "AST", "ALT", "ALKPHOS", "BILITOT", "PROT", "ALBUMIN" in the last 168 hours. No results for input(s): "LIPASE", "AMYLASE" in the last 168 hours. No results for input(s): "AMMONIA" in the last 168 hours. CBC: Recent Labs  Lab 10/18/21 1556 10/19/21 0355  WBC 13.4* 9.5  HGB 15.0 13.1  HCT 45.4 40.1  MCV 96.6 97.8  PLT 265 205   Cardiac Enzymes: No results for input(s): "CKTOTAL", "CKMB", "CKMBINDEX", "TROPONINI" in the last 168 hours. BNP: Invalid input(s): "POCBNP" CBG: Recent Labs  Lab 10/19/21 0609  GLUCAP 88   D-Dimer No results for input(s): "DDIMER" in the last 72 hours. Hgb A1c No results for input(s): "HGBA1C" in the last 72 hours. Lipid Profile No results for input(s): "CHOL", "HDL", "LDLCALC", "TRIG", "CHOLHDL", "LDLDIRECT" in the last 72 hours. Thyroid function studies No results for input(s): "TSH", "T4TOTAL", "T3FREE", "THYROIDAB" in the last 72 hours.  Invalid input(s): "FREET3" Anemia work up No results for input(s): "VITAMINB12", "FOLATE", "FERRITIN", "TIBC", "IRON", "RETICCTPCT" in the last 72 hours. Urinalysis    Component Value Date/Time   COLORURINE YELLOW  06/18/2012 1000   APPEARANCEUR CLEAR 06/18/2012 1000   LABSPEC 1.022 06/18/2012 1000   PHURINE 7.5 06/18/2012 1000   GLUCOSEU NEGATIVE 06/18/2012 1000   HGBUR NEGATIVE 06/18/2012 1000   BILIRUBINUR NEGATIVE 06/18/2012 1000   KETONESUR NEGATIVE 06/18/2012 1000   PROTEINUR NEGATIVE 06/18/2012 1000   UROBILINOGEN 0.2 06/18/2012 1000   NITRITE NEGATIVE 06/18/2012 1000   LEUKOCYTESUR MODERATE (A) 06/18/2012 1000   Sepsis Labs Recent Labs  Lab 10/18/21 1556 10/19/21 0355  WBC 13.4* 9.5   Microbiology No results  found for this or any previous visit (from the past 240 hour(s)).   Time coordinating discharge: Over 30 minutes  SIGNED:   Azucena Fallen, DO [facility] 10/19/2021, 6:44 PM Pager   If 7PM-7AM, please contact night-coverage [email or web address]

## 2021-10-19 NOTE — Progress Notes (Signed)
  Echocardiogram 2D Echocardiogram has been performed.  [name] 10/19/2021, 1:40 PM

## 2021-10-19 NOTE — Progress Notes (Signed)
ZIO

## 2021-10-19 NOTE — Consult Note (Addendum)
Cardiology Consultation:   Patient ID: ANNASOPHIA CROCKER MRN: [id number]; DOB: 06/10/1928  Admit date: 10/18/2021 Date of Consult: 10/19/2021  PCP:  Tally Joe, MD   [facility] Providers Cardiologist:  Lesleigh Noe, MD        Patient Profile:   MAGHAN JESSEE is a 88 y.o. female with a hx of SVT ablation in Jul 24, 2014, chronic diastolic heart failure, dyspnea on exertion, CVA, hyperlipidemia, kyphoscoliosis with restrictive pulmonary function, and history of palpitation who is being seen 10/19/2021 for the evaluation of presyncope at the request of Dr. Natale Milch.  History of Present Illness:   Ms. Mcculley is a pleasant 88 year old female with past medical history of SVT ablation in 07-24-14, chronic diastolic heart failure, dyspnea on exertion, CVA, hyperlipidemia, kyphoscoliosis with restrictive pulmonary function, and history of palpitation.  Last echocardiogram obtained on 02/26/2020 showed EF 60 to 65%, grade 2 DD, RVSP 34.8 mmHg, trivial MR.  She was seen by Dr. Armanda Magic in February with palpitation and dyspnea on exertion.  Heart monitor performed in March 2023 demonstrated sinus rhythm with heart rate ranges from 48-182 bpm, mean heart rate 70 bpm, frequent SVT with heart rate as high as 182 bpm, SVT vs afib or aflutter burden 4% (it appeared she had all 3 rhythm).  Patient had 2.5% PVC burden and 3% PAC burden.  She was placed on low-dose 12.5 mg daily of Toprol-XL and Farxiga in March by Dr. Katrinka Blazing following the heart monitor.  She did not have any improvement in her breathing on the Farxiga, therefore this was later discontinued in May.  Lower extremity ABI obtained in June 2023 was normal.  Patient was last seen by Jari Favre PA-C on 10/02/2021 at which time she described pounding episodes in her chest associated with lightheadedness and dizziness.  It was recommended for her to continue to monitor her symptoms.  It was decided to hold off on a repeat heart monitor.  In the past week, her  episodes of pounding sensation dizziness has worsened.  She woke up yesterday morning feeling poorly.  Throughout the day, she was having recurrent episode of dizzy spell.  Due to worsening symptoms and feeling of near passing out, she eventually sought medical attention at [facility], ED.  While in the ED waiting room, she had a severe episode where she felt like she was going to completely pass out. She was not on telemetry at the time. Since she has been hooked up to telemetry, her symptom has improved.  Echocardiogram is currently pending.  She is also pending orthostatic vital sign.  She says she was sitting down yesterday when she had the episode of near passing out.  She has been given 1 L of IV fluid.  Talking with the patient this morning, she is feeling well.    Past Medical History:  Diagnosis Date   Arthritis    Bilateral foot-drop 08/31/2020   Depression    PT'S HUSBAND AND SON HAVE DIED W/IN PAST YEAR 07-24-11   GERD (gastroesophageal reflux disease)    Hearing loss    Hyperlipidemia    Idiopathic peripheral neuropathy 07/18/2016   Nocturnal leg cramps 01/22/2017   Stroke [facility])    SVT (supraventricular tachycardia) (HCC) 09/2014   Thyroid disease     Past Surgical History:  Procedure Laterality Date   APPENDECTOMY     BACK SURGERY     lumbar   BLADDER SURGERY     BUNIONECTOMY     left and  right with hammer toe repair   CARPAL TUNNEL RELEASE Bilateral 2017   ELECTROPHYSIOLOGIC STUDY N/A 11/03/2014   Procedure: SVT Ablation;  Surgeon: Marinus Maw, MD;  Location: [facility] INVASIVE CV LAB;  Service: Cardiovascular;  Laterality: N/A;   EYE SURGERY     bilateral cataract extraction with IOL   FOOT SURGERY     KNEE ARTHROSCOPY  02/09/2011   Procedure: ARTHROSCOPY KNEE;  Surgeon: Jacki Cones;  Location: [facility] ORS;  Service: Orthopedics;  Laterality: Left;  Left Knee Arthroscopy with Menisectomy medial, abrasion chondroplasty medial, and synovectomy, supra patella pouch.   NASAL SINUS  SURGERY     OOPHORECTOMY     ROTATOR CUFF REPAIR  1/13,  3/13   x 3  right/   SALPINGECTOMY     SHOULDER OPEN ROTATOR CUFF REPAIR Left 06/26/2012   Procedure: LEFT SHOULDER ROTATOR CUFF REPAIR WITH ANCHORS ;  Surgeon: Jacki Cones, MD;  Location: [facility] ORS;  Service: Orthopedics;  Laterality: Left;   TOTAL KNEE ARTHROPLASTY  09/05/2011   Procedure: TOTAL KNEE ARTHROPLASTY;  Surgeon: Jacki Cones, MD;  Location: [facility] ORS;  Service: Orthopedics;  Laterality: Left;     Home Medications:  Prior to Admission medications   Medication Sig Start Date End Date Taking? Authorizing Provider  acetaminophen (TYLENOL) 500 MG tablet Take 2 tablets (1,000 mg total) by mouth every 6 (six) hours as needed for mild pain or fever. 02/08/21  Yes Trixie Deis R, PA-C  atorvastatin (LIPITOR) 40 MG tablet Take 40 mg by mouth daily.   Yes [provider]  beta carotene w/minerals (OCUVITE) tablet Take 1 tablet by mouth daily with breakfast.   Yes [provider]  BIOTIN PO Take 1 tablet by mouth daily.   Yes [provider]  cholecalciferol (VITAMIN D) 1000 UNITS tablet Take 1,000 Units by mouth daily.   Yes [provider]  escitalopram (LEXAPRO) 10 MG tablet Take 10 mg by mouth daily. 04/01/17  Yes [provider]  metoprolol succinate (TOPROL XL) 25 MG 24 hr tablet Take 0.5 tablets (12.5 mg total) by mouth daily. 05/31/21  Yes Lyn Records, MD  polyethylene glycol [facility]) packet Take 17 g by mouth daily.   Yes [provider]  traMADol (ULTRAM) 50 MG tablet Take 50 mg by mouth as needed for moderate pain. 07/21/21  Yes [provider]  traZODone (DESYREL) 50 MG tablet Take 75 mg by mouth at bedtime as needed for sleep. 01/08/21  Yes [provider]  vitamin B-12 (CYANOCOBALAMIN) 100 MCG tablet Take 100 mcg by mouth daily.   Yes [provider]  docusate sodium (COLACE) 100 MG capsule Take 1 capsule (100 mg total) by  mouth daily as needed for mild constipation. Patient not taking: Reported on 10/19/2021 02/08/21   Juliet Rude, PA-C    Inpatient Medications: Scheduled Meds:  atorvastatin  40 mg Oral Daily   enoxaparin (LOVENOX) injection  40 mg Subcutaneous Q24H   escitalopram  10 mg Oral Daily   metoprolol succinate  12.5 mg Oral Daily   sodium chloride flush  3 mL Intravenous Q12H   Continuous Infusions:  PRN Meds: acetaminophen **OR** acetaminophen, senna-docusate, traMADol, traZODone  Allergies:   No Known Allergies  Social History:   Social History   Socioeconomic History   Marital status: Widowed    Spouse name: Not on file   Number of children: 2   Years of education: Not on file   Highest education  level: Not on file  Occupational History   Occupation: Retired  Tobacco Use   Smoking status: Former    Packs/day: 0.50    Years: 5.00    Total pack years: 2.50    Types: Cigarettes    Quit date: 02/05/1959    Years since quitting: 62.7   Smokeless tobacco: Never  Substance and Sexual Activity   Alcohol use: Yes    Alcohol/week: 1.0 standard drink of alcohol    Types: 1 Glasses of wine per week    Comment: socially   Drug use: No   Sexual activity: Not on file  Other Topics Concern   Not on file  Social History Narrative   Lives alone   Caffeine use: 4 drinks coffee per day   Right-handed   Social Determinants of Health   Financial Resource Strain: Not on file  Food Insecurity: Not on file  Transportation Needs: Not on file  Physical Activity: Not on file  Stress: Not on file  Social Connections: Not on file  Intimate Partner Violence: Not on file    Family History:    Family History  Problem Relation Age of Onset   Diabetes Mother    Diabetes Son      ROS:  Please see the history of present illness.   All other ROS reviewed and negative.     Physical Exam/Data:   Vitals:   10/19/21 0845 10/19/21 0900 10/19/21 1100 10/19/21 1158  BP: 115/86  121/73 123/80   Pulse: 82 76 63   Resp: 18 17 14    Temp:    98 F (36.7 C)  TempSrc:    Oral  SpO2: 96% 97% 95%     Intake/Output Summary (Last 24 hours) at 10/19/2021 1238 Last data filed at 10/18/2021 2056 Gross per 24 hour  Intake 1000 ml  Output --  Net 1000 ml      10/02/2021    3:05 PM 08/01/2021   11:42 AM 05/31/2021   11:27 AM  Last 3 Weights  Weight (lbs) 133 lb 130 lb 3.2 oz 135 lb 9.6 oz  Weight (kg) 60.328 kg 59.058 kg 61.508 kg     There is no height or weight on file to calculate BMI.  General:  Well nourished, well developed, in no acute distress HEENT: normal Neck: no JVD Vascular: No carotid bruits; Distal pulses 2+ bilaterally Cardiac:  normal S1, S2; RRR; no murmur  Lungs:  clear to auscultation bilaterally, no wheezing, rhonchi or rales  Abd: soft, nontender, no hepatomegaly  Ext: no edema Musculoskeletal:  No deformities, BUE and BLE strength normal and equal Skin: warm and dry  Neuro:  CNs 2-12 intact, no focal abnormalities noted Psych:  Normal affect   EKG:  The EKG was personally reviewed and demonstrates: Normal sinus rhythm, no significant ST-T wave changes Telemetry:  Telemetry was personally reviewed and demonstrates: Normal sinus rhythm, transient bursts of SVT versus atrial tach, no obvious A-fib, no significant bradycardia  Relevant CV Studies:  Echo 02/26/2020  1. Left ventricular ejection fraction, by estimation, is 60 to 65%. The  left ventricle has normal function. The left ventricle has no regional  wall motion abnormalities. Left ventricular diastolic parameters are  consistent with Grade II diastolic  dysfunction (pseudonormalization).   2. Right ventricular systolic function is normal. The right ventricular  size is normal. There is normal pulmonary artery systolic pressure. The  estimated right ventricular systolic pressure is 34.8 mmHg.   3. Left atrial size was  mildly dilated.   4. The mitral valve is normal in structure.  Trivial mitral valve  regurgitation. No evidence of mitral stenosis.   5. The aortic valve is grossly normal. There is mild calcification of the  aortic valve. There is mild thickening of the aortic valve. Aortic valve  regurgitation is mild to moderate. No aortic stenosis is present.   6. The inferior vena cava is normal in size with greater than 50%  respiratory variability, suggesting right atrial pressure of 3 mmHg.    Laboratory Data:  High Sensitivity Troponin:   Recent Labs  Lab 10/18/21 1556 10/18/21 1940  TROPONINIHS 37* 42*     Chemistry Recent Labs  Lab 10/18/21 1556 10/18/21 1940 10/19/21 0355  NA 140  --  139  K 4.7  --  4.4  CL 102  --  105  CO2 28  --  29  GLUCOSE 101*  --  92  BUN 18  --  18  CREATININE 0.71  --  0.75  CALCIUM 10.2  --  9.0  MG  --  2.0  --   GFRNONAA >60  --  >60  ANIONGAP 10  --  5    No results for input(s): "PROT", "ALBUMIN", "AST", "ALT", "ALKPHOS", "BILITOT" in the last 168 hours. Lipids No results for input(s): "CHOL", "TRIG", "HDL", "LABVLDL", "LDLCALC", "CHOLHDL" in the last 168 hours.  Hematology Recent Labs  Lab 10/18/21 1556 10/19/21 0355  WBC 13.4* 9.5  RBC 4.70 4.10  HGB 15.0 13.1  HCT 45.4 40.1  MCV 96.6 97.8  MCH 31.9 32.0  MCHC 33.0 32.7  RDW 14.2 14.3  PLT 265 205   Thyroid No results for input(s): "TSH", "FREET4" in the last 168 hours.  BNPNo results for input(s): "BNP", "PROBNP" in the last 168 hours.  DDimer No results for input(s): "DDIMER" in the last 168 hours.   Radiology/Studies:  DG Chest 1 View  Result Date: 10/18/2021 CLINICAL DATA:  Shortness of breath. Near syncope over the last few days. Tachycardia. Personal history of atrial fibrillation. EXAM: CHEST  1 VIEW COMPARISON:  Two-view chest x-ray 02/06/2021 FINDINGS: Heart is enlarged. Atherosclerotic calcifications are present at the aortic arch. Lungs are clear. No focal airspace disease is present. Edema or effusion is present. Remote  left-sided rib fractures are again noted. Degenerative changes are present both shoulders. Levoconvex curvature is similar the prior exam. Remote spinal augmentation noted in the midthoracic spine. IMPRESSION: 1. Cardiomegaly without failure. 2. No acute cardiopulmonary disease. Electronically Signed   By: Marin Roberts M.D.   On: 10/18/2021 16:21     Assessment and Plan:   Presyncope   -Patient described the symptom as a irregular pounding sensation in the chest associated with near passing out spell.  She denies any significant body position changes prior to the events.  She denies any flushing sensation or any room spinning.   -She wore a Zio XT monitor in March for 14 days that showed 4% SVT versus atrial flutter burden.  She was not placed on anticoagulation therapy.  She was placed on 12.5 mg daily of Toprol-XL.   -Multiple episodes of presyncope yesterday, given 1 L IV fluid.  Most significant episode of presyncope occurred while in the emergency waiting room, however she was not hooked up to telemetry at the time.   -Pending orthostatic vital sign and echocardiogram.  Will discuss with MD, if both orthostatic vital sign echocardiogram abnormal, may consider on repeat heart monitor using a ZIO AT (  Live Monitor)  Borderline troponin elevation: Serial troponin flat 37-->42, denies any chest pain.  Pending echocardiogram.  Likely demand ischemia.  Given advanced age, no additional workup unless significant structural abnormality seen on echocardiogram  Leukocytosis: White blood cell count of 13.4 on arrival yesterday, returned to normal at 9.5 this morning.  PAF/Aflutter: 4% burden on previous monitor. Not on anticoagulation. 6 episodes of falls in the past year. Need to reassess for anticoagulation in the future.   Chronic diastolic heart failure: Euvolemic on exam  History of SVT ablation in 2016: Frequent SVT bursts seen on previous heart monitor in March  2023  Hyperlipidemia  Kyphoscoliosis: Likely contributory to her chronic dyspnea on exertion  History of CVA   Risk Assessment/Risk Scores:     For questions or updates, please contact [facility] Please consult [email or web address] for contact info under   Signed, Azalee Course, PA  10/19/2021 12:38 PM , Patient seen and examined.  Briefly she is a 88 year old female with past medical history of supraventricular tachycardia status post ablation, CVA, hyperlipidemia for evaluation of near syncope and palpitations.  Echocardiogram December 2021 showed normal LV function, grade 2 diastolic dysfunction, mild left atrial enlargement, mild to moderate aortic insufficiency.  Monitor March 2023 personally reviewed.  Patient has sinus rhythm with bouts of paroxysmal atrial fibrillation, atrial flutter and SVT.  Patient states she had minimal symptoms during the monitor.  She has continued to have intermittent palpitations.  Yesterday she had weakness/near syncopal feelings for significant part of the day.  Longest episode was 1 hour.  She did not have frank syncope.  These occurred both standing and lying.  She did also have palpitations.  She presented for further management and cardiology asked to evaluate.  She is now asymptomatic.  Laboratories show potassium 4.4, creatinine 0.75, troponin 37 and 42, hemoglobin 13.1, electrocardiogram with sinus rhythm, PACs ventricular hypertrophy and incomplete right bundle branch block. 1 presyncope/palpitations-etiology is unclear.  She does state that her symptoms correlate with palpitations.  However they were prolonged yesterday lasting up to 1 hour at a time.  This makes bradycardia mediated events less likely.  Plan repeat echocardiogram.  Will arrange outpatient live monitor to further assess.  May need implantable loop in the future.  Continue Toprol for now.  2 paroxysmal atrial fibrillation/flutter-I have personally reviewed the patient's previous monitor and  this appears to show paroxysmal atrial fibrillation and flutter.  She has multiple embolic risk factors with CHA2DS2-VASc of 5.  However I am hesitant to begin anticoagulation at this point as she states she has fallen 6 times in the past 2 years and is also having worsening presyncopal episodes.  We will reconsider this as an outpatient.  3 minimal troponin elevation-patient has not had chest pain and there is no clear trend.  Not consistent with acute coronary syndrome.  4 status post SVT ablation  If LV function normal on echocardiogram she can be discharged and we will arrange outpatient monitor as described above.  Olga Millers, MD

## 2021-10-19 NOTE — ED Notes (Signed)
Pt was able to ambulate with Spo2 remaining at 96%

## 2021-10-19 NOTE — ED Notes (Signed)
Pt provided discharge instructions and prescription information. Pt was given the opportunity to ask questions and questions were answered.   

## 2021-10-20 DIAGNOSIS — J449 Chronic obstructive pulmonary disease, unspecified: Secondary | ICD-10-CM | POA: Diagnosis not present

## 2021-10-20 DIAGNOSIS — J4530 Mild persistent asthma, uncomplicated: Secondary | ICD-10-CM | POA: Diagnosis not present

## 2021-10-20 DIAGNOSIS — I5032 Chronic diastolic (congestive) heart failure: Secondary | ICD-10-CM | POA: Diagnosis not present

## 2021-10-20 DIAGNOSIS — E782 Mixed hyperlipidemia: Secondary | ICD-10-CM | POA: Diagnosis not present

## 2021-10-23 ENCOUNTER — Telehealth: Payer: Self-pay | Admitting: *Deleted

## 2021-10-23 NOTE — Telephone Encounter (Signed)
   Cardiac Monitor Alert  Date of alert:  10/23/2021   Patient Name: Ann Rodriguez  DOB: 23-Mar-1928  MRN: [id number]   [facility] Cardiologist: Lesleigh Noe, MD  [facility] EP:  None    Monitor Information: Long Term Monitor-Live Telemetry [ZioAT]  Reason:  presyncope ER on 10/18/21 Ordering provider:  Harrell Lark   Alert Atrial Fibrillation/Flutter This is the 1st alert for this rhythm.  The patient has a hx of Atrial Fibrillation/Flutter.  The patient is not currently on anticoagulation.  Next Cardiology Appointment   Date:  10/15/21  Provider:  Dr. Katrinka Blazing  The patient was contacted today.  This was for 10/21/21 11:08 pm.  AF [id number] She was symptomatic.  She reports the following symptoms:  pt aware of heart racing Saturday evening as well as yesterday.  She gets short winded walking very short distances and feels her heart racing.  She does not feel like she will pass out.  Currently she is feeling okay. Arrhythmia, symptoms and history reviewed with Dr. Izora Ribas (DOD).  Plan:  pt has history of PAF.    Not been on anticoagulation in past due to falls.  Taking 12.5 mg Toprol XL daily. Scheduled her to see her primary cardiologist on 10/25/21. Zio report of event placed in Dr. Katrinka Blazing box for review.   Lendon Ka, RN  10/23/2021 9:42 AM

## 2021-10-24 NOTE — Progress Notes (Unsigned)
Cardiology Office Note:    Date:  10/25/2021   ID:  Ann Rodriguez, DOB 20-Jul-1928, MRN [id number]  PCP:  Tally Joe, MD  Cardiologist:  Lesleigh Noe, MD   Referring MD: Tally Joe, MD   Chief Complaint  Patient presents with   Atrial Fibrillation   Congestive Heart Failure    History of Present Illness:    Ann Rodriguez is a 88 y.o. female with a hx of   SVT, DOE, chronic diastolic hypertension, CVA, hyperlipidemia, and palpitations but no diagnosis of AF.   ZIO AT on 10/18/2021: Atrial fib.  Starting a week ago she felt fatigued and short of breath with any activity.  No palpitations.  EMS came to her house and says she was in atrial fib with rapid ventricular response.  She was seen in the emergency room and low-dose metoprolol was started.  A Zio patch was started for 2 weeks.  She is here today for follow-up and instruction.  Since 9 August she has felt fatigued and short of breath.  No difficulty if she sits.  She is not short of breath lying.  No swelling.  Past Medical History:  Diagnosis Date   Arthritis    Bilateral foot-drop 08/31/2020   Depression    PT'S HUSBAND AND SON HAVE DIED W/IN PAST YEAR 2011-07-16   GERD (gastroesophageal reflux disease)    Hearing loss    Hyperlipidemia    Idiopathic peripheral neuropathy 07/18/2016   Nocturnal leg cramps 01/22/2017   Stroke [facility])    SVT (supraventricular tachycardia) (HCC) 09/2014   Thyroid disease     Past Surgical History:  Procedure Laterality Date   APPENDECTOMY     BACK SURGERY     lumbar   BLADDER SURGERY     BUNIONECTOMY     left and right with hammer toe repair   CARPAL TUNNEL RELEASE Bilateral 07-16-15   ELECTROPHYSIOLOGIC STUDY N/A 11/03/2014   Procedure: SVT Ablation;  Surgeon: Marinus Maw, MD;  Location: [facility] INVASIVE CV LAB;  Service: Cardiovascular;  Laterality: N/A;   EYE SURGERY     bilateral cataract extraction with IOL   FOOT SURGERY     KNEE ARTHROSCOPY  02/09/2011   Procedure: ARTHROSCOPY  KNEE;  Surgeon: Jacki Cones;  Location: [facility]S;  Service: Orthopedics;  Laterality: Left;  Left Knee Arthroscopy with Menisectomy medial, abrasion chondroplasty medial, and synovectomy, supra patella pouch.   NASAL SINUS SURGERY     OOPHORECTOMY     ROTATOR CUFF REPAIR  1/13,  3/13   x 3  right/   SALPINGECTOMY     SHOULDER OPEN ROTATOR CUFF REPAIR Left 06/26/2012   Procedure: LEFT SHOULDER ROTATOR CUFF REPAIR WITH ANCHORS ;  Surgeon: Jacki Cones, MD;  Location: [facility];  Service: Orthopedics;  Laterality: Left;   TOTAL KNEE ARTHROPLASTY  09/05/2011   Procedure: TOTAL KNEE ARTHROPLASTY;  Surgeon: Jacki Cones, MD;  Location: [facility];  Service: Orthopedics;  Laterality: Left;    Current Medications: Current Meds  Medication Sig   acetaminophen (TYLENOL) 500 MG tablet Take 2 tablets (1,000 mg total) by mouth every 6 (six) hours as needed for mild pain or fever.   apixaban (ELIQUIS) 5 MG TABS tablet Take 1 tablet (5 mg total) by mouth 2 (two) times daily.   atorvastatin (LIPITOR) 40 MG tablet Take 40 mg by mouth daily.   beta carotene w/minerals (OCUVITE) tablet Take 1 tablet by mouth daily with breakfast.  BIOTIN PO Take 1 tablet by mouth daily.   cholecalciferol (VITAMIN D) 1000 UNITS tablet Take 1,000 Units by mouth daily.   escitalopram (LEXAPRO) 10 MG tablet Take 10 mg by mouth daily.   metoprolol succinate (TOPROL XL) 25 MG 24 hr tablet Take 0.5 tablets (12.5 mg total) by mouth daily.   polyethylene glycol (MIRALAX / GLYCOLAX) packet Take 17 g by mouth daily.   traMADol (ULTRAM) 50 MG tablet Take 50 mg by mouth as needed for moderate pain.   traZODone (DESYREL) 50 MG tablet Take 75 mg by mouth at bedtime as needed for sleep.   vitamin B-12 (CYANOCOBALAMIN) 100 MCG tablet Take 100 mcg by mouth daily.     Allergies:   Patient has no known allergies.   Social History   Socioeconomic History   Marital status: Widowed    Spouse name: Not on file   Number of children: 2    Years of education: Not on file   Highest education level: Not on file  Occupational History   Occupation: Retired  Tobacco Use   Smoking status: Former    Packs/day: 0.50    Years: 5.00    Total pack years: 2.50    Types: Cigarettes    Quit date: 02/05/1959    Years since quitting: 62.7   Smokeless tobacco: Never  Substance and Sexual Activity   Alcohol use: Yes    Alcohol/week: 1.0 standard drink of alcohol    Types: 1 Glasses of wine per week    Comment: socially   Drug use: No   Sexual activity: Not on file  Other Topics Concern   Not on file  Social History Narrative   Lives alone   Caffeine use: 4 drinks coffee per day   Right-handed   Social Determinants of Health   Financial Resource Strain: Not on file  Food Insecurity: Not on file  Transportation Needs: Not on file  Physical Activity: Not on file  Stress: Not on file  Social Connections: Not on file     Family History: The patient's family history includes Diabetes in her mother and son.  ROS:   Please see the history of present illness.    No ankle swelling or chest discomfort.  No bleeding history.  All other systems reviewed and are negative.  EKGs/Labs/Other Studies Reviewed:    The following studies were reviewed today:  MONITOR 05/2021: Normal sinus rhythm and sinus bradycardia as a basic underlying rhythm. Heart rate range 48 to 182 bpm. Mean heart rate 70 bpm. Frequent supraventricular tachycardia with rates as high as 182 bpm. Burden 4%. No atrial fibrillation or atrial flutter noted. PACs with burden of 3%, and PVCs with burden of 2.5%.  2 D Doppler ECHOCARDIOGRAM 10/19/2021: IMPRESSIONS     1. Left ventricular ejection fraction, by estimation, is 60 to 65%. The  left ventricle has normal function. The left ventricle has no regional  wall motion abnormalities. Left ventricular diastolic parameters are  consistent with Grade I diastolic  dysfunction (impaired relaxation).   2. Right  ventricular systolic function is mildly reduced. The right  ventricular size is mildly enlarged. There is mildly elevated pulmonary  artery systolic pressure.   3. Left atrial size was mildly dilated.   4. The mitral valve is normal in structure. Mild mitral valve  regurgitation. No evidence of mitral stenosis.   5. Tricuspid valve regurgitation is mild to moderate.   6. The aortic valve is grossly normal. There is mild calcification of  the  aortic valve. Aortic valve regurgitation is mild. No aortic stenosis is  present.   7. The inferior vena cava is normal in size with greater than 50%  respiratory variability, suggesting right atrial pressure of 3 mmHg.   EKG:  EKG not repeated today.  Telemetry monitor from the ZIO demonstrates atrial fibs with a variable heart rate between 76 and 140 bpm.  Recent Labs: 02/06/2021: ALT 14 10/02/2021: TSH 2.980 10/18/2021: Magnesium 2.0 10/19/2021: BUN 18; Creatinine, Ser 0.75; Hemoglobin 13.1; Platelets 205; Potassium 4.4; Sodium 139  Recent Lipid Panel    Component Value Date/Time   CHOL 161 11/03/2014 0450   TRIG 130 11/03/2014 0450   HDL 47 11/03/2014 0450   CHOLHDL 3.4 11/03/2014 0450   VLDL 26 11/03/2014 0450   LDLCALC 88 11/03/2014 0450    Physical Exam:    VS:  BP 112/62   Pulse 74   Ht 5' 5.5" (1.664 m)   Wt 132 lb (59.9 kg)   SpO2 98%   BMI 21.63 kg/m     Wt Readings from Last 3 Encounters:  10/25/21 132 lb (59.9 kg)  10/02/21 133 lb (60.3 kg)  08/01/21 130 lb 3.2 oz (59.1 kg)     GEN: Slender. No acute distress HEENT: Normal NECK: No JVD. LYMPHATICS: No lymphadenopathy CARDIAC: No murmur. IIRR no gallop, or edema. VASCULAR:  Normal Pulses. No bruits. RESPIRATORY:  Clear to auscultation without rales, wheezing or rhonchi  ABDOMEN: Soft, non-tender, non-distended, No pulsatile mass, MUSCULOSKELETAL: No deformity  SKIN: Warm and dry NEUROLOGIC:  Alert and oriented x 3 PSYCHIATRIC:  Normal affect   ASSESSMENT:     1. Persistent atrial fibrillation ([place])   2. Chronic diastolic heart failure ([place])   3. SVT (supraventricular tachycardia) ([place])   4. Idiopathic peripheral neuropathy   5. Irregular heart beat    PLAN:    In order of problems listed above:  We will start her on nadolol when she returns in 2 weeks.  Today we start Eliquis 5 mg twice daily.  We will get her set up for cardioversion in 4 weeks.  After she is back in rhythm we will likely decrease the Eliquis dose to 2.5 mg twice daily as she is right on the border/cut out for reduced dose therapy with weight of 60 kg.  We discussed the electrical cardioversion and she is willing to proceed, to feel better. No evidence of gross volume overload This is not the SVT that she has been dealing with over the years.  The difference was explained to her.     The natural history of atrial fibrillation was discussed.  The inability to cure and the possibility of recurrences was clearly stated.  Management strategies including rhythm control with antiarrhythmic therapy and/or ablation,  rate control (beta-blocker therapy, digoxin, or AV node blocking CCB therapy), and  occasional need for pacemaker therapy were reviewed.  Stroke risk (as determined by CHADS VASC score >1). The requirement for and the duration/permanence of anti-coagulation therapy will be determined by context of individual risk factors and burden of AF.  Anticoagulation risks (vitamin K antagonists v DOAC therapy) were reviewed, highlighting the lower bleeding ris@10RELATIVEDAYS @ k and improved safety with DOAC's. Shared decision making was stressed.    Medication Adjustments/Labs and Tests Ordered: Current medicines are reviewed at length with the patient today.  Concerns regarding medicines are outlined above.  No orders of the defined types were placed in this encounter.  Meds ordered this encounter  Medications  apixaban (ELIQUIS) 5 MG TABS tablet    Sig: Take 1 tablet (5 mg  total) by mouth 2 (two) times daily.    Dispense:  60 tablet    Refill:  11    Patient Instructions  Medication Instructions:  Your physician has recommended you make the following change in your medication:   1) START Apixaban (Eliquis) 5mg  twice daily  *If you need a refill on your cardiac medications before your next appointment, please call your pharmacy*  Lab Work: NONE  Testing/Procedures: NONE  Follow-Up: At [facility], you and your health needs are our priority.  As part of our continuing mission to provide you with exceptional heart care, we have created designated Provider Care Teams.  These Care Teams include your primary Cardiologist (physician) and Advanced Practice Providers (APPs -  Physician Assistants and Nurse Practitioners) who all work together to provide you with the care you need, when you need it.  Your next appointment:   2 week(s)  The format for your next appointment:   In Person  Provider:   Sinclair Grooms, MD {  Important Information About Sugar         Signed, Sinclair Grooms, MD  10/25/2021 3:26 PM    [place]

## 2021-10-25 ENCOUNTER — Ambulatory Visit: Payer: Medicare Other | Admitting: Interventional Cardiology

## 2021-10-25 ENCOUNTER — Encounter: Payer: Self-pay | Admitting: Interventional Cardiology

## 2021-10-25 VITALS — BP 112/62 | HR 74 | Ht 65.5 in | Wt 132.0 lb

## 2021-10-25 DIAGNOSIS — I471 Supraventricular tachycardia, unspecified: Secondary | ICD-10-CM

## 2021-10-25 DIAGNOSIS — I4819 Other persistent atrial fibrillation: Secondary | ICD-10-CM

## 2021-10-25 DIAGNOSIS — I5032 Chronic diastolic (congestive) heart failure: Secondary | ICD-10-CM | POA: Diagnosis not present

## 2021-10-25 DIAGNOSIS — I499 Cardiac arrhythmia, unspecified: Secondary | ICD-10-CM

## 2021-10-25 DIAGNOSIS — G609 Hereditary and idiopathic neuropathy, unspecified: Secondary | ICD-10-CM

## 2021-10-25 MED ORDER — APIXABAN 5 MG PO TABS: 5.0000 mg | ORAL_TABLET | Freq: Two times a day (BID) | ORAL | 11 refills | Status: DC

## 2021-10-25 NOTE — Patient Instructions (Signed)
Medication Instructions:  Your physician has recommended you make the following change in your medication:   1) START Apixaban (Eliquis) 5mg  twice daily  *If you need a refill on your cardiac medications before your next appointment, please call your pharmacy*  Lab Work: NONE  Testing/Procedures: NONE  Follow-Up: At , you and your health needs are our priority.  As part of our continuing mission to provide you with exceptional heart care, we have created designated Provider Care Teams.  These Care Teams include your primary Cardiologist (physician) and Advanced Practice Providers (APPs -  Physician Assistants and Nurse Practitioners) who all work together to provide you with the care you need, when you need it.  Your next appointment:   2 week(s)  The format for your next appointment:   In Person  Provider:   [employer], MD {  Important Information About Sugar

## 2021-10-26 ENCOUNTER — Encounter: Payer: Self-pay | Admitting: Interventional Cardiology

## 2021-10-31 DIAGNOSIS — M545 Low back pain, unspecified: Secondary | ICD-10-CM | POA: Diagnosis not present

## 2021-11-03 ENCOUNTER — Ambulatory Visit: Payer: Medicare Other | Admitting: Nurse Practitioner

## 2021-11-03 DIAGNOSIS — H353210 Exudative age-related macular degeneration, right eye, stage unspecified: Secondary | ICD-10-CM | POA: Diagnosis not present

## 2021-11-03 DIAGNOSIS — H5212 Myopia, left eye: Secondary | ICD-10-CM | POA: Diagnosis not present

## 2021-11-03 DIAGNOSIS — H5201 Hypermetropia, right eye: Secondary | ICD-10-CM | POA: Diagnosis not present

## 2021-11-03 DIAGNOSIS — H3581 Retinal edema: Secondary | ICD-10-CM | POA: Diagnosis not present

## 2021-11-07 NOTE — Progress Notes (Unsigned)
Cardiology Office Note:    Date:  11/08/2021   ID:  Ann Rodriguez, DOB 1928-07-24, MRN [id number]  PCP:  Tally Joe, MD  Cardiologist:  Lesleigh Noe, MD   Referring MD: Tally Joe, MD   No chief complaint on file.   History of Present Illness:    Ann Rodriguez is a 88 y.o. female with a hx of   SVT, DOE, chronic diastolic hypertension, CVA, hyperlipidemia, and palpitations but no diagnosis of AF.     ZIO AT on 10/18/2021: Atrial fib.  Returns today for follow-up.  Will consider adding dronedarone in preparation for cardioversion.  She feels somewhat better than on the last visit.  She is back today to check prior to having cardioversion performed.  EKG done today reveals a wandering atrial pacemaker rhythm that is mimicking atrial fibrillation and allowing the algorithm to make a diagnosis of AF erroneously.  She does states that her energy is improved compared to the last time I saw her.  The full monitor results have not been returned.  Past Medical History:  Diagnosis Date   Arthritis    Bilateral foot-drop 08/31/2020   Depression    Ann Rodriguez HAVE DIED W/IN PAST YEAR 07-03-11   GERD (gastroesophageal reflux disease)    Hearing loss    Hyperlipidemia    Idiopathic peripheral neuropathy 07/18/2016   Nocturnal leg cramps 01/22/2017   Stroke [facility])    SVT (supraventricular tachycardia) (HCC) 09/2014   Thyroid disease     Past Surgical History:  Procedure Laterality Date   APPENDECTOMY     BACK SURGERY     lumbar   BLADDER SURGERY     BUNIONECTOMY     left and right with hammer toe repair   CARPAL TUNNEL RELEASE Bilateral 07-03-15   ELECTROPHYSIOLOGIC STUDY N/A 11/03/2014   Procedure: SVT Ablation;  Surgeon: Marinus Maw, MD;  [facility] INVASIVE CV LAB;  Service: Cardiovascular;  Laterality: N/A;   EYE SURGERY     bilateral cataract extraction with IOL   FOOT SURGERY     KNEE ARTHROSCOPY  02/09/2011   Procedure: ARTHROSCOPY KNEE;  Surgeon: Jacki Cones;  Location: [facility];  Service: Orthopedics;  Laterality: Left;  Left Knee Arthroscopy with Menisectomy medial, abrasion chondroplasty medial, and synovectomy, supra patella pouch.   NASAL SINUS SURGERY     OOPHORECTOMY     ROTATOR CUFF REPAIR  1/13,  3/13   x 3  right/   SALPINGECTOMY     SHOULDER OPEN ROTATOR CUFF REPAIR Left 06/26/2012   Procedure: LEFT SHOULDER ROTATOR CUFF REPAIR WITH ANCHORS ;  Surgeon: Jacki Cones, MD;  Location: [facility];  Service: Orthopedics;  Laterality: Left;   TOTAL KNEE ARTHROPLASTY  09/05/2011   Procedure: TOTAL KNEE ARTHROPLASTY;  Surgeon: Jacki Cones, MD;  Location: [facility];  Service: Orthopedics;  Laterality: Left;    Current Medications: Current Meds  Medication Sig   acetaminophen (TYLENOL) 500 MG tablet Take 2 tablets (1,000 mg total) by mouth every 6 (six) hours as needed for mild pain or fever.   apixaban (ELIQUIS) 5 MG TABS tablet Take 1 tablet (5 mg total) by mouth 2 (two) times daily.   atorvastatin (LIPITOR) 40 MG tablet Take 40 mg by mouth daily.   beta carotene w/minerals (OCUVITE) tablet Take 1 tablet by mouth daily with breakfast.   BIOTIN PO Take 1 tablet by mouth daily.   cholecalciferol (VITAMIN D) 1000 UNITS tablet  Take 1,000 Units by mouth daily.   escitalopram (LEXAPRO) 10 MG tablet Take 10 mg by mouth daily.   metoprolol succinate (TOPROL XL) 25 MG 24 hr tablet Take 0.5 tablets (12.5 mg total) by mouth daily.   polyethylene glycol (MIRALAX / GLYCOLAX) packet Take 17 g by mouth daily.   traMADol (ULTRAM) 50 MG tablet Take 50 mg by mouth as needed for moderate pain.   traZODone (DESYREL) 50 MG tablet Take 75 mg by mouth at bedtime as needed for sleep.   vitamin B-12 (CYANOCOBALAMIN) 100 MCG tablet Take 100 mcg by mouth daily.     Allergies:   Patient has no known allergies.   Social History   Socioeconomic History   Marital status: Widowed    Spouse name: Not on file   Number of children: 2   Years of education: Not  on file   Highest education level: Not on file  Occupational History   Occupation: Retired  Tobacco Use   Smoking status: Former    Packs/day: 0.50    Years: 5.00    Total pack years: 2.50    Types: Cigarettes    Quit date: 02/05/1959    Years since quitting: 62.8   Smokeless tobacco: Never  Substance and Sexual Activity   Alcohol use: Yes    Alcohol/week: 1.0 standard drink of alcohol    Types: 1 Glasses of wine per week    Comment: socially   Drug use: No   Sexual activity: Not on file  Other Topics Concern   Not on file  Social History Narrative   Lives alone   Caffeine use: 4 drinks coffee per day   Right-handed   Social Determinants of Health   Financial Resource Strain: Not on file  Food Insecurity: Not on file  Transportation Needs: Not on file  Physical Activity: Not on file  Stress: Not on file  Social Connections: Not on file     Family History: The patient's family history includes Diabetes in her mother and Rodriguez.  ROS:   Please see the history of present illness.    No bleeding on Eliquis.  All other systems reviewed and are negative.  EKGs/Labs/Other Studies Reviewed:    The following studies were reviewed today: No new data.  EKG:  EKG normal sinus rhythm with PACs versus wandering atrial pacemaker, incomplete right bundle.  P waves are clearly present on today's EKG.  Recent Labs: 02/06/2021: ALT 14 10/02/2021: TSH 2.980 10/18/2021: Magnesium 2.0 10/19/2021: BUN 18; Creatinine, Ser 0.75; Hemoglobin 13.1; Platelets 205; Potassium 4.4; Sodium 139  Recent Lipid Panel    Component Value Date/Time   CHOL 161 11/03/2014 0450   TRIG 130 11/03/2014 0450   HDL 47 11/03/2014 0450   CHOLHDL 3.4 11/03/2014 0450   VLDL 26 11/03/2014 0450   LDLCALC 88 11/03/2014 0450    Physical Exam:    VS:  BP 124/64   Pulse 68   Ht 5' 5.5" (1.664 m)   Wt 135 lb 12.8 oz (61.6 kg)   SpO2 97%   BMI 22.25 kg/m     Wt Readings from Last 3 Encounters:  11/08/21  135 lb 12.8 oz (61.6 kg)  10/25/21 132 lb (59.9 kg)  10/02/21 133 lb (60.3 kg)     GEN: Elderly but younger than stated age in appearance. No acute distress HEENT: Normal NECK: No JVD. LYMPHATICS: No lymphadenopathy CARDIAC: No murmur. iiRR no gallop, or edema. VASCULAR:  Normal Pulses. No bruits. RESPIRATORY:  Clear  to auscultation without rales, wheezing or rhonchi  ABDOMEN: Soft, non-tender, non-distended, No pulsatile mass, MUSCULOSKELETAL: No deformity  SKIN: Warm and dry NEUROLOGIC:  Alert and oriented x 3 PSYCHIATRIC:  Normal affect   ASSESSMENT:    1. Persistent atrial fibrillation (HCC)   2. Chronic diastolic heart failure (HCC)   3. Dyspnea on exertion    PLAN:    In order of problems listed above:  I believe she is in a rhythm that is not atrial fibrillation today and likely wandering atrial pacemaker rhythm mimicking a half.  We know she has had atrial fibrillation.  The plan today is to start Trinate around 400 mg p.o. twice daily return in 2 weeks with EKG and for clinical follow-up.  By that time we should have the full 4-week event monitor/Holter monitor available.  She may need to have EP evaluation to confirm appropriate management.  My goal at this time with starting Trinate around is to hold sinus rhythm and not have rapid atrial fibrillation that she has demonstrated recently. No evidence of volume overload Improved, possibly because of conversion to normal sinus rhythm.   2-week follow-up with EKG on June 8 around 400 mg twice daily.  Cancel previously scheduled electrical cardioversion.  Medication Adjustments/Labs and Tests Ordered: Current medicines are reviewed at length with the patient today.  Concerns regarding medicines are outlined above.  No orders of the defined types were placed in this encounter.  No orders of the defined types were placed in this encounter.   There are no Patient Instructions on file for this visit.   Signed, Lesleigh Noe, MD  11/08/2021 2:59 PM    [facility]

## 2021-11-08 ENCOUNTER — Ambulatory Visit: Payer: Medicare Other | Attending: Nurse Practitioner | Admitting: Interventional Cardiology

## 2021-11-08 ENCOUNTER — Encounter: Payer: Self-pay | Admitting: Interventional Cardiology

## 2021-11-08 VITALS — BP 124/64 | HR 68 | Ht 65.5 in | Wt 135.8 lb

## 2021-11-08 DIAGNOSIS — I4819 Other persistent atrial fibrillation: Secondary | ICD-10-CM

## 2021-11-08 DIAGNOSIS — H43813 Vitreous degeneration, bilateral: Secondary | ICD-10-CM | POA: Diagnosis not present

## 2021-11-08 DIAGNOSIS — H353132 Nonexudative age-related macular degeneration, bilateral, intermediate dry stage: Secondary | ICD-10-CM | POA: Diagnosis not present

## 2021-11-08 DIAGNOSIS — I5032 Chronic diastolic (congestive) heart failure: Secondary | ICD-10-CM | POA: Diagnosis not present

## 2021-11-08 DIAGNOSIS — R0609 Other forms of dyspnea: Secondary | ICD-10-CM | POA: Diagnosis not present

## 2021-11-08 DIAGNOSIS — H31091 Other chorioretinal scars, right eye: Secondary | ICD-10-CM | POA: Diagnosis not present

## 2021-11-08 MED ORDER — FLECAINIDE ACETATE 50 MG PO TABS: 50.0000 mg | ORAL_TABLET | Freq: Two times a day (BID) | ORAL | 3 refills | Status: DC

## 2021-11-08 NOTE — Patient Instructions (Signed)
Medication Instructions:  Your physician has recommended you make the following change in your medication:   1) START Flecainide 50mg  twice daily  *If you need a refill on your cardiac medications before your next appointment, please call your pharmacy*  Lab Work: NONE  Testing/Procedures: NONE  Follow-Up: At [facility], you and your health needs are our priority.  As part of our continuing mission to provide you with exceptional heart care, we have created designated Provider Care Teams.  These Care Teams include your primary Cardiologist (physician) and Advanced Practice Providers (APPs -  Physician Assistants and Nurse Practitioners) who all work together to provide you with the care you need, when you need it.  Your next appointment:   2-3 week(s) with EKG  The format for your next appointment:   In Person  Provider:   [facility], MD  or Lesleigh Noe, PA-C, Jari Favre, NP, or Eligha Bridegroom, PA-C      Important Information About Sugar

## 2021-11-09 ENCOUNTER — Telehealth: Payer: Self-pay | Admitting: Interventional Cardiology

## 2021-11-09 NOTE — Telephone Encounter (Signed)
Called patient back about her message. Informed her that is she has used the coupon any time in the past it would no longer be any good. Patient stated she does not think she has ever been on eliquis before. Informed patient that it was on her list for about a month in 2016. Patient stated she does not remember, but cannot fully deny that she was on it. Patient given patient assistance program number for eliquis. Will forward to the preauth nurse to help.

## 2021-11-09 NOTE — Telephone Encounter (Signed)
Pt c/o medication issue:  1. Name of Medication:   apixaban (ELIQUIS) 5 MG TABS tablet    2. How are you currently taking this medication (dosage and times per day)? Take 1 tablet (5 mg total) by mouth 2 (two) times daily.  3. Are you having a reaction (difficulty breathing--STAT)? No  4. What is your medication issue? Pt states that at her last visit provider gave her a coupon for above medication. She says that when she went to go pick up medication and presented the coupon, she was told that it had already been used. Pt would like a callback regarding this matter

## 2021-11-10 ENCOUNTER — Telehealth: Payer: Self-pay | Admitting: *Deleted

## 2021-11-10 NOTE — Telephone Encounter (Signed)
Received faxed copy of final report of Zio Monitor.  Reviewed chart and last ov with Dr. Katrinka Blazing.  Placed report in Dr. Michaelle Copas mailbox for his review upon return to the office.

## 2021-11-10 NOTE — Telephone Encounter (Signed)
**Note De-Identified Ann Rodriguez Obfuscation** The pt states that she has not called [employer] yet but plans to today.  She is aware to ask them questions about their Eliquis asst program and her eligibility to be approved for the program and that if she is eligible to ask them to mail her an application to her home address.  I advised her that once she receives the application to complete her part of it, obtain required documents per [employer], and to bring all to Dr Lonn Georgia office at [facility] [address] in [place] to drop off in the front office and that we will take care of the providers page of her application and that we will fax all to [employer].  She verbalized understanding and thanked me for calling her to discuss.

## 2021-11-14 NOTE — Addendum Note (Signed)
Encounter addended by: Andee Lineman A on: 11/14/2021 1:51 PM  Actions taken: Imaging Exam ended

## 2021-11-21 ENCOUNTER — Ambulatory Visit (HOSPITAL_COMMUNITY): Admit: 2021-11-21 | Payer: Medicare Other | Admitting: Cardiology

## 2021-11-21 ENCOUNTER — Encounter (HOSPITAL_COMMUNITY): Payer: Self-pay

## 2021-11-21 SURGERY — CARDIOVERSION
Anesthesia: General

## 2021-11-27 DIAGNOSIS — E559 Vitamin D deficiency, unspecified: Secondary | ICD-10-CM | POA: Diagnosis not present

## 2021-11-27 DIAGNOSIS — R7303 Prediabetes: Secondary | ICD-10-CM | POA: Diagnosis not present

## 2021-11-27 DIAGNOSIS — Z0000 Encounter for general adult medical examination without abnormal findings: Secondary | ICD-10-CM | POA: Diagnosis not present

## 2021-11-27 DIAGNOSIS — F419 Anxiety disorder, unspecified: Secondary | ICD-10-CM | POA: Diagnosis not present

## 2021-11-27 DIAGNOSIS — E782 Mixed hyperlipidemia: Secondary | ICD-10-CM | POA: Diagnosis not present

## 2021-11-28 NOTE — Progress Notes (Unsigned)
Cardiology Office Note:    Date:  11/30/2021   ID:  Ann Rodriguez, DOB 1928/12/18, MRN [id number]  PCP:  Tally Joe, MD   [facility] Providers Cardiologist:  Lesleigh Noe, MD     Referring MD: Tally Joe, MD   Chief Complaint: follow-up AAD therapy  History of Present Illness:    Ann Rodriguez is a very pleasant 88 y.o. female with a hx of SVT s/p ablation 2016, chronic HFpEF, hypertension, CVA, hyperlipidemia  Previously on flecainide and anticoagulation with possible PAF remotely. Cardiac monitor ordered for palpitations 05/2021 did not reveal atrial fibrillation/flutter. Seen by Dr. Katrinka Blazing 05/31/21 with cc of shortness of breath with simple activities. She was started on Farxiga, however there was not significant improvement in her breathing so it was d/ced. Started on Toprol XL 12.5 mg daily for SVT/junctional tachycardia noted on recent monitor. Seen in follow-up 08/01/21 by Dr. Katrinka Blazing. She reported bilateral foot numbness. LE arterial u/s revealed good blood flow bilaterally. One year follow-up was recommended.  Office visit 10/02/21 with Jari Favre, PA for worsening palpitations described as "pounding in her chest."  Incidences occurring 3-4 times per week, lasting all day and she has no energy.  EKG in the office read out atrial fibrillation with premature ventricular complexes and right bundle branch block, however on further evaluation there are P waves present.  EKG discussed with Dr. Katrinka Blazing and it was not felt that a monitor was needed at this time.  Labs obtained at that visit were unremarkable she was advised to follow-up in 2 to 3 months.  Hospital admission 10/18/2021 for presyncope. Symptoms described as irregular pounding sensation in her chest with nearly passing out. Had an episode while in the lobby where she felt like she was going to pass out. No telemetry at that time. Cardiology was consulted. Hs troponins 37 ? 42. TTE revealed normal LVEF 60-65%, G1DD, mildly  elevated PASP, mild MR, mild to moderate TR, mild AI. Zio monitor 10/18/2021 revealed atrial fibrillation.  Per Dr. Jens Som during admission, CHA2DS2-VASc of 5 however hesitant to begin anticoagulation as she has fallen 6 times in the past 2 years and is having worsening presyncopal events  Seen in follow-up by Dr. Katrinka Blazing on 10/25/21 and started on Eliquis 5 mg BID with plan for cardioversion. She returned on  11/08/21 for follow-up with Dr. Katrinka Blazing. He reported that EKG at office visit on 11/08/2021 revealed wandering atrial pacemaker that is mimicking atrial fibrillation and allowing the algorithm to make a diagnosis of AF erroneously. Previously scheduled DCCV was cancelled. Flecainide 50 mg twice daily was started with plan for 2-3 week office visit and EKG. Final read of cardiac monitor revealed 8% atrial fibrillation burden, longest episode lasting 32 minutes.  Frequent premature beats.  Today, she is here for follow-up of initiation of flecainde.  Reports she is feeling about the same since starting flecainide, no noticeable improvement. Reports fatigue for > 1 year. When styling her hair and putting on makeup she has to rest afterwards.  She denies chest pain, dyspnea, orthopnea, PND, presyncope, syncope.  Reports her legs were swollen prior to today.  Had recently been at her son's house and was eating more in general because he is a good cook, possibly consuming more sodium. Admits she does not drink much water.   Past Medical History:  Diagnosis Date   Arthritis    Bilateral foot-drop 08/31/2020   Depression    PT'S HUSBAND AND SON HAVE DIED  W/IN PAST YEAR 2013   GERD (gastroesophageal reflux disease)    Hearing loss    Hyperlipidemia    Idiopathic peripheral neuropathy 07/18/2016   Nocturnal leg cramps 01/22/2017   Stroke [facility](HCC)    SVT (supraventricular tachycardia) (HCC) 09/2014   Thyroid disease     Past Surgical History:  Procedure Laterality Date   APPENDECTOMY     BACK SURGERY      lumbar   BLADDER SURGERY     BUNIONECTOMY     left and right with hammer toe repair   CARPAL TUNNEL RELEASE Bilateral 2017   ELECTROPHYSIOLOGIC STUDY N/A 11/03/2014   Procedure: SVT Ablation;  Surgeon: Marinus MawGregg W Taylor, MD;  [facility];  Service: Cardiovascular;  Laterality: N/A;   EYE SURGERY     bilateral cataract extraction with IOL   FOOT SURGERY     KNEE ARTHROSCOPY  02/09/2011   Procedure: ARTHROSCOPY KNEE;  Surgeon: Jacki Conesonald A Gioffre;  Location: [facility];  Service: Orthopedics;  Laterality: Left;  Left Knee Arthroscopy with Menisectomy medial, abrasion chondroplasty medial, and synovectomy, supra patella pouch.   NASAL SINUS SURGERY     OOPHORECTOMY     ROTATOR CUFF REPAIR  1/13,  3/13   x 3  right/   SALPINGECTOMY     SHOULDER OPEN ROTATOR CUFF REPAIR Left 06/26/2012   Procedure: LEFT SHOULDER ROTATOR CUFF REPAIR WITH ANCHORS ;  Surgeon: Jacki Conesonald A Gioffre, MD;  Location: [facility];  Service: Orthopedics;  Laterality: Left;   TOTAL KNEE ARTHROPLASTY  09/05/2011   Procedure: TOTAL KNEE ARTHROPLASTY;  Surgeon: Jacki Conesonald A Gioffre, MD;  Location: [facility];  Service: Orthopedics;  Laterality: Left;    Current Medications: Current Meds  Medication Sig   acetaminophen (TYLENOL) 500 MG tablet Take 2 tablets (1,000 mg total) by mouth every 6 (six) hours as needed for mild pain or fever.   apixaban (ELIQUIS) 5 MG TABS tablet Take 1 tablet (5 mg total) by mouth 2 (two) times daily.   atorvastatin (LIPITOR) 40 MG tablet Take 40 mg by mouth daily.   beta carotene w/minerals (OCUVITE) tablet Take 1 tablet by mouth daily with breakfast.   BIOTIN PO Take 1 tablet by mouth daily.   cholecalciferol (VITAMIN D) 1000 UNITS tablet Take 1,000 Units by mouth daily.   escitalopram (LEXAPRO) 10 MG tablet Take 10 mg by mouth daily.   flecainide (TAMBOCOR) 50 MG tablet Take 1 tablet (50 mg total) by mouth 2 (two) times daily.   metoprolol succinate (TOPROL XL) 25 MG 24 hr tablet Take 0.5 tablets  (12.5 mg total) by mouth daily.   polyethylene glycol (MIRALAX / GLYCOLAX) packet Take 17 g by mouth daily.   traMADol (ULTRAM) 50 MG tablet Take 50 mg by mouth as needed for moderate pain.   traZODone (DESYREL) 50 MG tablet Take 75 mg by mouth at bedtime as needed for sleep.   vitamin B-12 (CYANOCOBALAMIN) 100 MCG tablet Take 100 mcg by mouth daily.     Allergies:   Patient has no known allergies.   Social History   Socioeconomic History   Marital status: Widowed    Spouse name: Not on file   Number of children: 2   Years of education: Not on file   Highest education level: Not on file  Occupational History   Occupation: Retired  Tobacco Use   Smoking status: Former    Packs/day: 0.50    Years: 5.00    Total pack years: 2.50  Types: Cigarettes    Quit date: 02/05/1959    Years since quitting: 62.8   Smokeless tobacco: Never  Substance and Sexual Activity   Alcohol use: Yes    Alcohol/week: 1.0 standard drink of alcohol    Types: 1 Glasses of wine per week    Comment: socially   Drug use: No   Sexual activity: Not on file  Other Topics Concern   Not on file  Social History Narrative   Lives alone   Caffeine use: 4 drinks coffee per day   Right-handed   Social Determinants of Health   Financial Resource Strain: Not on file  Food Insecurity: Not on file  Transportation Needs: Not on file  Physical Activity: Not on file  Stress: Not on file  Social Connections: Not on file     Family History: The patient's family history includes Diabetes in her mother and son.  ROS:   Please see the history of present illness.    + fatigue All other systems reviewed and are negative.  Labs/Other Studies Reviewed:    The following studies were reviewed today:  Cardiac monitor 11/16/21    Predominant underlying rhythm is normal sinus with an average heart rate of 72 bpm (heart rate range during the study is 45 to 197 bpm)   Atrial fib/flutter with 8% burden during the  study.  Longest episode lasted 32 minutes 31 seconds with an average heart rate of 129 bpm.   Nonsustained ventricular tachycardia rate 197 bpm versus atrial flutter with aberration.   PVC burden 1%   PAC burden greater than 9%  Echo 10/19/21   1. Left ventricular ejection fraction, by estimation, is 60 to 65%. The  left ventricle has normal function. The left ventricle has no regional  wall motion abnormalities. Left ventricular diastolic parameters are  consistent with Grade I diastolic  dysfunction (impaired relaxation).   2. Right ventricular systolic function is mildly reduced. The right  ventricular size is mildly enlarged. There is mildly elevated pulmonary  artery systolic pressure.   3. Left atrial size was mildly dilated.   4. The mitral valve is normal in structure. Mild mitral valve  regurgitation. No evidence of mitral stenosis.   5. Tricuspid valve regurgitation is mild to moderate.   6. The aortic valve is grossly normal. There is mild calcification of the  aortic valve. Aortic valve regurgitation is mild. No aortic stenosis is  present.   7. The inferior vena cava is normal in size with greater than 50%  respiratory variability, suggesting right atrial pressure of 3 mmHg.   Recent Labs: 02/06/2021: ALT 14 10/02/2021: TSH 2.980 10/18/2021: Magnesium 2.0 10/19/2021: BUN 18; Creatinine, Ser 0.75; Hemoglobin 13.1; Platelets 205; Potassium 4.4; Sodium 139  Recent Lipid Panel    Component Value Date/Time   CHOL 161 11/03/2014 0450   TRIG 130 11/03/2014 0450   HDL 47 11/03/2014 0450   CHOLHDL 3.4 11/03/2014 0450   VLDL 26 11/03/2014 0450   LDLCALC 88 11/03/2014 0450     Risk Assessment/Calculations:    CHA2DS2-VASc Score = 5   This indicates a 7.2% annual risk of stroke. The patient's score is based upon: CHF History: 1 HTN History: 1 Diabetes History: 0 Stroke History: 0 Vascular Disease History: 0 Age Score: 2 Gender Score: 1   Physical Exam:    VS:  BP  118/70 (BP Location: Left Arm, Patient Position: Sitting, Cuff Size: Normal)   Pulse 67   Ht 5\' 5"  (1.651 m)  Wt 136 lb (61.7 kg)   SpO2 92%   BMI 22.63 kg/m     Wt Readings from Last 3 Encounters:  11/30/21 136 lb (61.7 kg)  11/08/21 135 lb 12.8 oz (61.6 kg)  10/25/21 132 lb (59.9 kg)     GEN: Well nourished, well developed in no acute distress HEENT: Normal NECK: No JVD; No carotid bruits CARDIAC: Irregular RR, no murmurs, rubs, gallops RESPIRATORY:  Clear to auscultation without rales, wheezing or rhonchi  ABDOMEN: Soft, non-tender, non-distended MUSCULOSKELETAL:  No edema; No deformity. 2+ pedal pulses, equal bilaterally SKIN: Warm and dry NEUROLOGIC:  Alert and oriented x 3 PSYCHIATRIC:  Normal affect   EKG:  EKG is ordered today.  The ekg ordered today demonstrates wandering atrial pacemaker at 67 bpm, QTc 426 ms    Diagnoses:    1. PAF (paroxysmal atrial fibrillation) (HCC)   2. Secondary hypercoagulable state (HCC)   3. Chronic diastolic heart failure (HCC)   4. High risk medication use   5. Wandering atrial pacemaker    Assessment and Plan:     PAF/Wandering atrial pacemaker on chronic anticoagulation: EKG and cardiac monitor results reviewed with Dr. Ladona Ridgel.  Today's EKG reveals wandering atrial pacemaker with well-controlled ventricular rate.  He agrees that there is at least some burden of atrial fibrillation on recent monitor. Reasonable to continue flecainide 50 mg twice daily, reports EP referral would like warrant continuation of current therapy. QTc is stable today at 426 ms.  Continue flecainide, metoprolol.   High risk medication monitoring: On flecainide 50 mg twice daily. EKG is stable today. On low dose BB.  We will continue current dose of flecainde.   Fatigue: Reports fatigue for > 1 year. Notes she feels particularly fatigued when using her arms. Has not worsened since starting flecainide. Doubt that low dose BB would cause significant fatigue.  Encouraged better hydration and some light resistance training to strengthen arms.   Chronic HFpEF: LVEF 60-65%, G1DD on echo 10/19/21. Reports recent LE edema that resolved after a few days.  Was staying with her son and was eating more in general, maybe more sodium. Has been elevating legs since returning home with improvement. No edema today, appears euvolemic on exam. She denies dyspnea, orthopnea, PND.  BP is well controlled. No medication changes today.     Disposition: Keep your appointment with Dr. Katrinka Blazing in 1 month  Medication Adjustments/Labs and Tests Ordered: Current medicines are reviewed at length with the patient today.  Concerns regarding medicines are outlined above.  Orders Placed This Encounter  Procedures   EKG 12-Lead   No orders of the defined types were placed in this encounter.   Patient Instructions  Medication Instructions:   Your physician recommends that you continue on your current medications as directed. Please refer to the Current Medication list given to you today.   *If you need a refill on your cardiac medications before your next appointment, please call your pharmacy*   Lab Work:  None ordered.  If you have labs (blood work) drawn today and your tests are completely normal, you will receive your results only by: MyChart Message (if you have MyChart) OR A paper copy in the mail If you have any lab test that is abnormal or we need to change your treatment, we will call you to review the results.   Testing/Procedures:  None ordered.    Follow-Up: At [facility], you and your health needs are our priority.  As part of  our continuing mission to provide you with exceptional heart care, we have created designated Provider Care Teams.  These Care Teams include your primary Cardiologist (physician) and Advanced Practice Providers (APPs -  Physician Assistants and Nurse Practitioners) who all work together to provide you with the care you  need, when you need it.  We recommend signing up for the patient portal called "MyChart".  Sign up information is provided on this After Visit Summary.  MyChart is used to connect with patients for Virtual Visits (Telemedicine).  Patients are able to view lab/test results, encounter notes, upcoming appointments, etc.  Non-urgent messages can be sent to your provider as well.   To learn more about what you can do with MyChart, go to [email or web address].    Your next appointment:   1 month(s)  The format for your next appointment:   In Person  Provider:   Lesleigh Noe, MD     Other Instructions  Please work on better hydration.   Important Information About Sugar         Signed, Levi Aland, NP  11/30/2021 2:16 PM    [facility]

## 2021-11-30 ENCOUNTER — Encounter: Payer: Self-pay | Admitting: Nurse Practitioner

## 2021-11-30 ENCOUNTER — Ambulatory Visit: Payer: Medicare Other | Attending: Nurse Practitioner | Admitting: Nurse Practitioner

## 2021-11-30 VITALS — BP 118/70 | HR 67 | Ht 65.0 in | Wt 136.0 lb

## 2021-11-30 DIAGNOSIS — I498 Other specified cardiac arrhythmias: Secondary | ICD-10-CM

## 2021-11-30 DIAGNOSIS — D6869 Other thrombophilia: Secondary | ICD-10-CM

## 2021-11-30 DIAGNOSIS — I5032 Chronic diastolic (congestive) heart failure: Secondary | ICD-10-CM | POA: Diagnosis not present

## 2021-11-30 DIAGNOSIS — I480 Paroxysmal atrial fibrillation: Secondary | ICD-10-CM | POA: Diagnosis not present

## 2021-11-30 DIAGNOSIS — Z79899 Other long term (current) drug therapy: Secondary | ICD-10-CM

## 2021-11-30 DIAGNOSIS — I4819 Other persistent atrial fibrillation: Secondary | ICD-10-CM

## 2021-11-30 NOTE — Patient Instructions (Signed)
Medication Instructions:   Your physician recommends that you continue on your current medications as directed. Please refer to the Current Medication list given to you today.   *If you need a refill on your cardiac medications before your next appointment, please call your pharmacy*   Lab Work:  None ordered.  If you have labs (blood work) drawn today and your tests are completely normal, you will receive your results only by: [place] (if you have MyChart) OR A paper copy in the mail If you have any lab test that is abnormal or we need to change your treatment, we will call you to review the results.   Testing/Procedures:  None ordered.    Follow-Up: At [facility], you and your health needs are our priority.  As part of our continuing mission to provide you with exceptional heart care, we have created designated Provider Care Teams.  These Care Teams include your primary Cardiologist (physician) and Advanced Practice Providers (APPs -  Physician Assistants and Nurse Practitioners) who all work together to provide you with the care you need, when you need it.  We recommend signing up for the patient portal called "MyChart".  Sign up information is provided on this After Visit Summary.  MyChart is used to connect with patients for Virtual Visits (Telemedicine).  Patients are able to view lab/test results, encounter notes, upcoming appointments, etc.  Non-urgent messages can be sent to your provider as well.   To learn more about what you can do with MyChart, go to [email or web address].    Your next appointment:   1 month(s)  The format for your next appointment:   In Person  Provider:   Sinclair Grooms, MD     Other Instructions  Please work on better hydration.   Important Information About Sugar

## 2021-12-07 DIAGNOSIS — H353132 Nonexudative age-related macular degeneration, bilateral, intermediate dry stage: Secondary | ICD-10-CM | POA: Diagnosis not present

## 2021-12-07 DIAGNOSIS — H31091 Other chorioretinal scars, right eye: Secondary | ICD-10-CM | POA: Diagnosis not present

## 2021-12-07 DIAGNOSIS — H43813 Vitreous degeneration, bilateral: Secondary | ICD-10-CM | POA: Diagnosis not present

## 2021-12-12 NOTE — Progress Notes (Unsigned)
Cardiology Office Note:    Date:  12/13/2021   ID:  Ann Rodriguez, DOB 09-Jan-1929, MRN [id number]  PCP:  Antony Contras, MD  Cardiologist:  Sinclair Grooms, MD   Referring MD: Antony Contras, MD   Chief Complaint  Patient presents with   Atrial Fibrillation   Shortness of Breath    History of Present Illness:    Ann Rodriguez is a 88 y.o. female with a hx of  SVT, DOE, chronic diastolic hypertension, CVA, hyperlipidemia, and PAF (8% burden).   She still feels short of breath.  Short of breath even while sitting despite O2 saturation 98%.  We tried SGLT2 therapy thinking that she had a component of diastolic heart failure but it did not bring about any improvement and the medication was stopped.  She denies orthopnea.  On flecainide, palpitations have significantly decreased.  She has not had any falls or bleeding on Eliquis.  Her CHA2DS2-VASc score is greater than 3.  She saw Dr. Stanford Breed while hospitalized in August.  She requests to switch over to him for longitudinal care after my retirement.  Past Medical History:  Diagnosis Date   Arthritis    Bilateral foot-drop 08/31/2020   Depression    PT'S HUSBAND AND SON HAVE DIED W/IN PAST YEAR 07-18-2011   GERD (gastroesophageal reflux disease)    Hearing loss    Hyperlipidemia    Idiopathic peripheral neuropathy 07/18/2016   Nocturnal leg cramps 01/22/2017   Stroke ([place])    SVT (supraventricular tachycardia) 09/2014   Thyroid disease     Past Surgical History:  Procedure Laterality Date   APPENDECTOMY     BACK SURGERY     lumbar   BLADDER SURGERY     BUNIONECTOMY     left and right with hammer toe repair   CARPAL TUNNEL RELEASE Bilateral 07-18-15   ELECTROPHYSIOLOGIC STUDY N/A 11/03/2014   Procedure: SVT Ablation;  Surgeon: Evans Lance, MD;  Location: [facility];  Service: Cardiovascular;  Laterality: N/A;   EYE SURGERY     bilateral cataract extraction with IOL   FOOT SURGERY     KNEE ARTHROSCOPY  02/09/2011    Procedure: ARTHROSCOPY KNEE;  Surgeon: Tobi Bastos;  Location: [facility];  Service: Orthopedics;  Laterality: Left;  Left Knee Arthroscopy with Menisectomy medial, abrasion chondroplasty medial, and synovectomy, supra patella pouch.   NASAL SINUS SURGERY     OOPHORECTOMY     ROTATOR CUFF REPAIR  1/13,  3/13   x 3  right/   SALPINGECTOMY     SHOULDER OPEN ROTATOR CUFF REPAIR Left 06/26/2012   Procedure: LEFT SHOULDER ROTATOR CUFF REPAIR WITH ANCHORS ;  Surgeon: Tobi Bastos, MD;  Location: [facility];  Service: Orthopedics;  Laterality: Left;   TOTAL KNEE ARTHROPLASTY  09/05/2011   Procedure: TOTAL KNEE ARTHROPLASTY;  Surgeon: Tobi Bastos, MD;  Location: [facility];  Service: Orthopedics;  Laterality: Left;    Current Medications: Current Meds  Medication Sig   acetaminophen (TYLENOL) 500 MG tablet Take 2 tablets (1,000 mg total) by mouth every 6 (six) hours as needed for mild pain or fever.   apixaban (ELIQUIS) 5 MG TABS tablet Take 1 tablet (5 mg total) by mouth 2 (two) times daily.   atorvastatin (LIPITOR) 40 MG tablet Take 40 mg by mouth daily.   beta carotene w/minerals (OCUVITE) tablet Take 1 tablet by mouth daily with breakfast.   BIOTIN PO Take 1 tablet by mouth daily.  cholecalciferol (VITAMIN D) 1000 UNITS tablet Take 1,000 Units by mouth daily.   escitalopram (LEXAPRO) 10 MG tablet Take 10 mg by mouth daily.   flecainide (TAMBOCOR) 50 MG tablet Take 1 tablet (50 mg total) by mouth 2 (two) times daily.   metoprolol succinate (TOPROL XL) 25 MG 24 hr tablet Take 0.5 tablets (12.5 mg total) by mouth daily.   polyethylene glycol (MIRALAX / GLYCOLAX) packet Take 17 g by mouth daily.   traMADol (ULTRAM) 50 MG tablet Take 50 mg by mouth as needed for moderate pain.   traZODone (DESYREL) 50 MG tablet Take 75 mg by mouth at bedtime as needed for sleep.   vitamin B-12 (CYANOCOBALAMIN) 100 MCG tablet Take 100 mcg by mouth daily.     Allergies:   Patient has no known allergies.    Social History   Socioeconomic History   Marital status: Widowed    Spouse name: Not on file   Number of children: 2   Years of education: Not on file   Highest education level: Not on file  Occupational History   Occupation: Retired  Tobacco Use   Smoking status: Former    Packs/day: 0.50    Years: 5.00    Total pack years: 2.50    Types: Cigarettes    Quit date: 02/05/1959    Years since quitting: 62.8   Smokeless tobacco: Never  Substance and Sexual Activity   Alcohol use: Yes    Alcohol/week: 1.0 standard drink of alcohol    Types: 1 Glasses of wine per week    Comment: socially   Drug use: No   Sexual activity: Not on file  Other Topics Concern   Not on file  Social History Narrative   Lives alone   Caffeine use: 4 drinks coffee per day   Right-handed   Social Determinants of Health   Financial Resource Strain: Not on file  Food Insecurity: Not on file  Transportation Needs: Not on file  Physical Activity: Not on file  Stress: Not on file  Social Connections: Not on file     Family History: The patient's family history includes Diabetes in her mother and son.  ROS:   Please see the history of present illness.    Walks with a cane.  No bleeding and no recurrent falls since starting Eliquis.  All other systems reviewed and are negative.  EKGs/Labs/Other Studies Reviewed:    The following studies were reviewed today:  ECHOCARDIOGRAM 10/19/2021:  IMPRESSIONS   1. Left ventricular ejection fraction, by estimation, is 60 to 65%. The  left ventricle has normal function. The left ventricle has no regional  wall motion abnormalities. Left ventricular diastolic parameters are  consistent with Grade I diastolic  dysfunction (impaired relaxation).   2. Right ventricular systolic function is mildly reduced. The right  ventricular size is mildly enlarged. There is mildly elevated pulmonary  artery systolic pressure.   3. Left atrial size was mildly dilated.    4. The mitral valve is normal in structure. Mild mitral valve  regurgitation. No evidence of mitral stenosis.   5. Tricuspid valve regurgitation is mild to moderate.   6. The aortic valve is grossly normal. There is mild calcification of the  aortic valve. Aortic valve regurgitation is mild. No aortic stenosis is  present.   7. The inferior vena cava is normal in size with greater than 50%  respiratory variability, suggesting right atrial pressure of 3 mmHg.   LONG TERM MONITOR 11/29/2021: Study  Highlights      Predominant underlying rhythm is normal sinus with an average heart rate of 72 bpm (heart rate range during the study is 45 to 197 bpm)   Atrial fib/flutter with 8% burden during the study.  Longest episode lasted 32 minutes 31 seconds with an average heart rate of 129 bpm.   Nonsustained ventricular tachycardia rate 197 bpm versus atrial flutter with aberration.   PVC burden 1%   PAC burden greater than 9%   EKG:  EKG is not repeated today.  Recent Labs: 02/06/2021: ALT 14 10/02/2021: TSH 2.980 10/18/2021: Magnesium 2.0 10/19/2021: BUN 18; Creatinine, Ser 0.75; Hemoglobin 13.1; Platelets 205; Potassium 4.4; Sodium 139  Recent Lipid Panel    Component Value Date/Time   CHOL 161 11/03/2014 0450   TRIG 130 11/03/2014 0450   HDL 47 11/03/2014 0450   CHOLHDL 3.4 11/03/2014 0450   VLDL 26 11/03/2014 0450   LDLCALC 88 11/03/2014 0450    Physical Exam:    VS:  BP (!) 142/62   Pulse (!) 57   Ht 5\' 5"  (1.651 m)   Wt 135 lb (61.2 kg)   SpO2 98%   BMI 22.47 kg/m     Wt Readings from Last 3 Encounters:  12/13/21 135 lb (61.2 kg)  11/30/21 136 lb (61.7 kg)  11/08/21 135 lb 12.8 oz (61.6 kg)     GEN: Elderly and frail. No acute distress HEENT: Normal NECK: No JVD. LYMPHATICS: No lymphadenopathy CARDIAC: No murmur. RRR no gallop, or edema. VASCULAR:  Normal Pulses. No bruits. RESPIRATORY:  Clear to auscultation without rales, wheezing or rhonchi  ABDOMEN: Soft,  non-tender, non-distended, No pulsatile mass, MUSCULOSKELETAL: No deformity  SKIN: Warm and dry NEUROLOGIC:  Alert and oriented x 3 PSYCHIATRIC:  Normal affect   ASSESSMENT:    1. PAF (paroxysmal atrial fibrillation) ([place])   2. Chronic diastolic heart failure ([place])   3. Secondary hypercoagulable state ([place])   4. Dyspnea on exertion   5. Kyphoscoliosis   6. Shortness of breath    PLAN:    In order of problems listed above:  Tolerating low-dose flecainide.  Clinically in sinus rhythm. Did not derive any benefit from SGLT2 therapy relative to dyspnea on exertion. Eliquis therapy without complications.  Continue current dose and consider reduction to 2.5 mg twice daily at some point in the future. Continues to be a problem along with a chronic complaint of feeling short of breath even while sitting Likely contributing to his shortness of breath. Pulmonary consultation.  Is part of her breathing complaint related to restrictive lung disease from kyphoscoliosis?   Longitudinal care with Dr. Stanford Breed as requested in 6 to 9 months.   Medication Adjustments/Labs and Tests Ordered: Current medicines are reviewed at length with the patient today.  Concerns regarding medicines are outlined above.  No orders of the defined types were placed in this encounter.  No orders of the defined types were placed in this encounter.   There are no Patient Instructions on file for this visit.   Signed, Sinclair Grooms, MD  12/13/2021 11:17 AM    [place]

## 2021-12-13 ENCOUNTER — Encounter: Payer: Self-pay | Admitting: Interventional Cardiology

## 2021-12-13 ENCOUNTER — Ambulatory Visit: Payer: Medicare Other | Attending: Interventional Cardiology | Admitting: Interventional Cardiology

## 2021-12-13 VITALS — BP 142/62 | HR 57 | Ht 65.0 in | Wt 135.0 lb

## 2021-12-13 DIAGNOSIS — I5032 Chronic diastolic (congestive) heart failure: Secondary | ICD-10-CM

## 2021-12-13 DIAGNOSIS — I480 Paroxysmal atrial fibrillation: Secondary | ICD-10-CM

## 2021-12-13 DIAGNOSIS — R0609 Other forms of dyspnea: Secondary | ICD-10-CM | POA: Diagnosis not present

## 2021-12-13 DIAGNOSIS — D6869 Other thrombophilia: Secondary | ICD-10-CM

## 2021-12-13 DIAGNOSIS — R0602 Shortness of breath: Secondary | ICD-10-CM

## 2021-12-13 DIAGNOSIS — M419 Scoliosis, unspecified: Secondary | ICD-10-CM

## 2021-12-13 NOTE — Patient Instructions (Signed)
Medication Instructions:  Your physician recommends that you continue on your current medications as directed. Please refer to the Current Medication list given to you today.  *If you need a refill on your cardiac medications before your next appointment, please call your pharmacy*  Lab Work: NONE  Testing/Procedures: NONE  Follow-Up: At [facility], you and your health needs are our priority.  As part of our continuing mission to provide you with exceptional heart care, we have created designated Provider Care Teams.  These Care Teams include your primary Cardiologist (physician) and Advanced Practice Providers (APPs -  Physician Assistants and Nurse Practitioners) who all work together to provide you with the care you need, when you need it.  Your next appointment:   6-9 month(s)  The format for your next appointment:   In Person  Provider:   Kirk Ruths, MD  Important Information About Sugar

## 2021-12-13 NOTE — Addendum Note (Signed)
Addended by: Molli Barrows on: 12/13/2021 11:26 AM   Modules accepted: Orders

## 2021-12-29 DIAGNOSIS — M545 Low back pain, unspecified: Secondary | ICD-10-CM | POA: Diagnosis not present

## 2021-12-29 DIAGNOSIS — M25511 Pain in right shoulder: Secondary | ICD-10-CM | POA: Diagnosis not present

## 2022-01-04 DIAGNOSIS — H5212 Myopia, left eye: Secondary | ICD-10-CM | POA: Diagnosis not present

## 2022-01-30 DIAGNOSIS — J449 Chronic obstructive pulmonary disease, unspecified: Secondary | ICD-10-CM | POA: Diagnosis not present

## 2022-01-30 DIAGNOSIS — J4530 Mild persistent asthma, uncomplicated: Secondary | ICD-10-CM | POA: Diagnosis not present

## 2022-01-30 DIAGNOSIS — R0602 Shortness of breath: Secondary | ICD-10-CM | POA: Diagnosis not present

## 2022-01-30 DIAGNOSIS — J209 Acute bronchitis, unspecified: Secondary | ICD-10-CM | POA: Diagnosis not present

## 2022-02-03 DIAGNOSIS — H60391 Other infective otitis externa, right ear: Secondary | ICD-10-CM | POA: Diagnosis not present

## 2022-02-03 DIAGNOSIS — H6121 Impacted cerumen, right ear: Secondary | ICD-10-CM | POA: Diagnosis not present

## 2022-02-03 DIAGNOSIS — Z6821 Body mass index (BMI) 21.0-21.9, adult: Secondary | ICD-10-CM | POA: Diagnosis not present

## 2022-02-06 DIAGNOSIS — H6120 Impacted cerumen, unspecified ear: Secondary | ICD-10-CM | POA: Diagnosis not present

## 2022-02-06 DIAGNOSIS — R0609 Other forms of dyspnea: Secondary | ICD-10-CM | POA: Diagnosis not present

## 2022-02-06 DIAGNOSIS — J4530 Mild persistent asthma, uncomplicated: Secondary | ICD-10-CM | POA: Diagnosis not present

## 2022-02-06 DIAGNOSIS — H6982 Other specified disorders of Eustachian tube, left ear: Secondary | ICD-10-CM | POA: Diagnosis not present

## 2022-02-19 ENCOUNTER — Ambulatory Visit (INDEPENDENT_AMBULATORY_CARE_PROVIDER_SITE_OTHER): Payer: Medicare Other | Admitting: Pulmonary Disease

## 2022-02-19 VITALS — BP 138/70 | HR 62 | Ht 65.0 in | Wt 140.0 lb

## 2022-02-19 DIAGNOSIS — J432 Centrilobular emphysema: Secondary | ICD-10-CM

## 2022-02-19 MED ORDER — TRELEGY ELLIPTA 200-62.5-25 MCG/ACT IN AEPB
1.0000 | INHALATION_SPRAY | Freq: Every day | RESPIRATORY_TRACT | 5 refills | Status: DC
Start: 2022-02-19 — End: 2022-04-12

## 2022-02-19 NOTE — Progress Notes (Signed)
Subjective:   PATIENT ID: Ann Rodriguez GENDER: female DOB: 1928-07-28, MRN: [id number]   HPI  Chief Complaint  Patient presents with   Follow-up    Had bronchitis since last visit      Reason for Visit: Follow-up   Ann Rodriguez is a 88 year old female with kyphoscoliosis, SVT status post ablation 2006, hx TIA, HLD, prior sinus surgery who presents for follow-up  Synopsis: Initially presented for longstanding shortness of breath for over a year. Denies associated chest pain, cough, wheezing or sputum production. Reports her symptoms have limited her ability to clean the house like she used to. PCP and Cardiology work-up has revealed chronic diastolic heart failure otherwise negative for etiology.   04/2020 Since her last visit, her symptoms of dyspnea have remained unchanged.  Occurs with exertion.  Has limited her activities.  She was previously very active participating in water aerobics.  During the pandemic she has not been able to engage as often as she would like.  She did have a recent fall while doing yoga and has modified her movements by using a chair.  Does report benefit after starting this activity.  Denies any associated wheezing.  Does have a dry cough.  Symptoms do not seem different from day or night.  02/19/22 Last visit was >1 year ago. Since our last visit she reports shortness of breath including with simple activities including while combing her hair. Denies any at rest but with any exertion including. Denies cough and wheezing. Has been on Breo for one month but not improved and albuterol. Uses albuterol 3-4 times a day. Taking mucinex for congestion.  Social History: Significant exposure from her spouse. He passed with COPD Social smoker. Quit in 1960. Possibly 3 pack-years at most   Past Medical History:  Diagnosis Date   Arthritis    Bilateral foot-drop 08/31/2020   Depression    PT'S HUSBAND AND SON HAVE DIED W/IN PAST YEAR 2013   GERD  (gastroesophageal reflux disease)    Hearing loss    Hyperlipidemia    Idiopathic peripheral neuropathy 07/18/2016   Nocturnal leg cramps 01/22/2017   Stroke ([place])    SVT (supraventricular tachycardia) 09/2014   Thyroid disease    No Known Allergies   Outpatient Medications Prior to Visit  Medication Sig Dispense Refill   acetaminophen (TYLENOL) 500 MG tablet Take 2 tablets (1,000 mg total) by mouth every 6 (six) hours as needed for mild pain or fever.     albuterol (VENTOLIN HFA) 108 (90 Base) MCG/ACT inhaler SMARTSIG:1 Puff(s) By Mouth Every 4-6 Hours PRN     apixaban (ELIQUIS) 5 MG TABS tablet Take 1 tablet (5 mg total) by mouth 2 (two) times daily. 60 tablet 11   atorvastatin (LIPITOR) 40 MG tablet Take 40 mg by mouth daily.     beta carotene w/minerals (OCUVITE) tablet Take 1 tablet by mouth daily with breakfast.     BIOTIN PO Take 1 tablet by mouth daily.     cholecalciferol (VITAMIN D) 1000 UNITS tablet Take 1,000 Units by mouth daily.     escitalopram (LEXAPRO) 10 MG tablet Take 10 mg by mouth daily.  0   flecainide (TAMBOCOR) 50 MG tablet Take 1 tablet (50 mg total) by mouth 2 (two) times daily. 180 tablet 3   metoprolol succinate (TOPROL XL) 25 MG 24 hr tablet Take 0.5 tablets (12.5 mg total) by mouth daily. 45 tablet 3   polyethylene glycol (MIRALAX / GLYCOLAX) packet Take 17  g by mouth daily.     traMADol (ULTRAM) 50 MG tablet Take 50 mg by mouth as needed for moderate pain.     traZODone (DESYREL) 50 MG tablet Take 75 mg by mouth at bedtime as needed for sleep.     vitamin B-12 (CYANOCOBALAMIN) 100 MCG tablet Take 100 mcg by mouth daily.     No facility-administered medications prior to visit.    Review of Systems  Constitutional:  Negative for chills, diaphoresis, fever, malaise/fatigue and weight loss.  HENT:  Negative for congestion.   Respiratory:  Positive for shortness of breath. Negative for cough, hemoptysis, sputum production and wheezing.   Cardiovascular:   Negative for chest pain, palpitations and leg swelling.     Objective:   Vitals:   02/19/22 1348  BP: 138/70  Pulse: 62  SpO2: 98%  Weight: 140 lb (63.5 kg)  Height: 5\' 5"  (1.651 m)   SpO2: 98 % O2 Device: None (Room air)  Physical Exam: General: Well-appearing, elderly, kyphoscoliosis, no acute distress HENT: [place], AT Eyes: EOMI, no scleral icterus Respiratory: Clear to auscultation bilaterally.  No crackles, wheezing or rales Cardiovascular: RRR, -M/R/G, no JVD Extremities:-Edema,-tenderness Neuro: AAO x4, CNII-XII grossly intact Psych: Normal mood, normal affect   Data Reviewed:  Imaging: CTA 02/18/13 - No PE. Background emphysema CXR Ribs 07/25/19 Remote left rib fractures 5-7. Subacute left 10th rib fracture.  CT Chest 02/06/21 - mild scarring in RUL and bilateral lower lobes CXR 10/18/21 - Cardiomegaly. No acute infiltrate, effusion or edema  PFT: 10/28/14 FVC 2.81 (109%) FEV1 2.2 (116%) Ratio 70  TLC 95% DLCO 75% Interpretation: Mild obstructive defect with mildly reduced DLCO  04/21/20 FVC 2.1 (90%) FEV1 1.69 (98%) Ratio 74  TLC 84% DLCO 78% Interpretation: No obstruction on spirometry however bronchodilator response significant in FEV1. Normal lung volumes. Reduced gas exchange.  Labs: CBC    Component Value Date/Time   WBC 9.5 10/19/2021 0355   RBC 4.10 10/19/2021 0355   HGB 13.1 10/19/2021 0355   HGB 13.8 10/02/2021 1553   HCT 40.1 10/19/2021 0355   HCT 42.9 10/02/2021 1553   PLT 205 10/19/2021 0355   PLT 247 10/02/2021 1553   MCV 97.8 10/19/2021 0355   MCV 96 10/02/2021 1553   MCH 32.0 10/19/2021 0355   MCHC 32.7 10/19/2021 0355   RDW 14.3 10/19/2021 0355   RDW 13.1 10/02/2021 1553   LYMPHSABS 0.7 02/06/2021 1815   MONOABS 0.7 02/06/2021 1815   EOSABS 0.0 02/06/2021 1815   BASOSABS 0.0 02/06/2021 1815      Assessment & Plan:   Discussion: 88 year old female with remote smoking history who initially presented with progressive shortness of  breath >2 years who presents for follow-up. Prior PFTs demonstrate obstructive defect on CTA in 2014 commenting on emphysema. She also has mild kyphoscoliosis and history of rib fractures that can contribute to her dyspnea. Deconditioning is also playing a role since she has decreased activity for many years. ICS/LABA has been ineffective though compliance may be an issue. Will step up and once control improves, can step down. Counseled on regular activity and though she would benefit from rehab, she is limited due to her balance issues.  Mild Persistent Asthma --STOP Breo --START Trelegy 200 ONE puff once a day --CONTINUE Albuterol TWO puffs ONCE a day. This is your as needed --START regular exercise. Encourage 10,000 steps a day. Start upper body exercises with weights.  --In the future if you continue to have balance issues,  we can refer to physical therapy  Kyphoscoliosis Remote rib fractures Deconditioning --Supportive care --Encourage regular activity as tolerated.  OK to do yoga with chair  Health Maintenance Immunization History  Administered Date(s) Administered   Influenza Split 11/20/2007   Influenza, High Dose Seasonal PF 11/24/2009, 11/06/2010, 12/10/2011, 11/17/2012, 12/10/2013, 12/03/2014, 12/12/2015, 11/01/2016, 12/12/2017, 11/20/2018, 11/12/2019   PFIZER(Purple Top)SARS-COV-2 Vaccination 04/02/2019, 04/23/2019, 12/16/2019   Pneumococcal Conjugate-13 05/10/2014   Pneumococcal Polysaccharide-23 03/17/2009   CT Lung Screen - not indicated  No orders of the defined types were placed in this encounter.  Meds ordered this encounter  Medications   Fluticasone-Umeclidin-Vilant (TRELEGY ELLIPTA) 200-62.5-25 MCG/ACT AEPB    Sig: Inhale 1 puff into the lungs daily.    Dispense:  60 each    Refill:  5   Return in about 2 months (around 04/22/2022).  I have spent a total time of 32-minutes on the day of the appointment including chart review, data review, collecting history,  coordinating care and discussing medical diagnosis and plan with the patient/family. Past medical history, allergies, medications were reviewed. Pertinent imaging, labs and tests included in this note have been reviewed and interpreted independently by me.  [place], MD [facility] 02/19/2022 2:02 PM  Office Number [phone]

## 2022-02-19 NOTE — Patient Instructions (Addendum)
Emphysema --STOP Breo --START Trelegy 200 ONE puff once a day --CONTINUE Albuterol TWO puffs ONCE a day. This is your as needed --START regular exercise. Encourage 10,000 steps a day. Start upper body exercises with weights.  --In the future if you continue to have balance issues, we can refer to physical therapy --Consider chair yoga classes  Follow-up with me in Feb 2024

## 2022-02-23 DIAGNOSIS — H353132 Nonexudative age-related macular degeneration, bilateral, intermediate dry stage: Secondary | ICD-10-CM | POA: Diagnosis not present

## 2022-02-23 DIAGNOSIS — H43813 Vitreous degeneration, bilateral: Secondary | ICD-10-CM | POA: Diagnosis not present

## 2022-02-23 DIAGNOSIS — H31091 Other chorioretinal scars, right eye: Secondary | ICD-10-CM | POA: Diagnosis not present

## 2022-02-25 ENCOUNTER — Encounter (HOSPITAL_BASED_OUTPATIENT_CLINIC_OR_DEPARTMENT_OTHER): Payer: Self-pay | Admitting: Pulmonary Disease

## 2022-03-14 ENCOUNTER — Encounter (HOSPITAL_COMMUNITY): Payer: Self-pay | Admitting: Emergency Medicine

## 2022-03-14 ENCOUNTER — Telehealth: Payer: Self-pay | Admitting: Interventional Cardiology

## 2022-03-14 ENCOUNTER — Emergency Department (HOSPITAL_COMMUNITY): Payer: Medicare Other

## 2022-03-14 ENCOUNTER — Emergency Department (HOSPITAL_COMMUNITY)
Admission: EM | Admit: 2022-03-14 | Discharge: 2022-03-14 | Discharge status: Against Medical Advice | Payer: Medicare Other | Attending: Emergency Medicine | Admitting: Emergency Medicine

## 2022-03-14 ENCOUNTER — Other Ambulatory Visit: Payer: Self-pay

## 2022-03-14 DIAGNOSIS — Z5321 Procedure and treatment not carried out due to patient leaving prior to being seen by health care provider: Secondary | ICD-10-CM | POA: Diagnosis not present

## 2022-03-14 DIAGNOSIS — R059 Cough, unspecified: Secondary | ICD-10-CM | POA: Insufficient documentation

## 2022-03-14 DIAGNOSIS — R0602 Shortness of breath: Secondary | ICD-10-CM | POA: Diagnosis not present

## 2022-03-14 LAB — CBC WITH DIFFERENTIAL/PLATELET
Abs Immature Granulocytes: 0.03 10*3/uL (ref 0.00–0.07)
Basophils Absolute: 0 10*3/uL (ref 0.0–0.1)
Basophils Relative: 0 %
Eosinophils Absolute: 0.1 10*3/uL (ref 0.0–0.5)
Eosinophils Relative: 1 %
HCT: 36.9 % (ref 36.0–46.0)
Hemoglobin: 12.1 g/dL (ref 12.0–15.0)
Immature Granulocytes: 1 %
Lymphocytes Relative: 14 %
Lymphs Abs: 0.9 10*3/uL (ref 0.7–4.0)
MCH: 31.4 pg (ref 26.0–34.0)
MCHC: 32.8 g/dL (ref 30.0–36.0)
MCV: 95.8 fL (ref 80.0–100.0)
Monocytes Absolute: 0.7 10*3/uL (ref 0.1–1.0)
Monocytes Relative: 11 %
Neutro Abs: 4.7 10*3/uL (ref 1.7–7.7)
Neutrophils Relative %: 73 %
Platelets: 247 10*3/uL (ref 150–400)
RBC: 3.85 MIL/uL — ABNORMAL LOW (ref 3.87–5.11)
RDW: 13.5 % (ref 11.5–15.5)
WBC: 6.3 10*3/uL (ref 4.0–10.5)
nRBC: 0 % (ref 0.0–0.2)

## 2022-03-14 LAB — BASIC METABOLIC PANEL
Anion gap: 7 (ref 5–15)
BUN: 16 mg/dL (ref 8–23)
CO2: 26 mmol/L (ref 22–32)
Calcium: 8.9 mg/dL (ref 8.9–10.3)
Chloride: 104 mmol/L (ref 98–111)
Creatinine, Ser: 0.6 mg/dL (ref 0.44–1.00)
GFR, Estimated: >60 mL/min (ref >60)
Glucose, Bld: 105 mg/dL — ABNORMAL HIGH (ref 70–99)
Potassium: 4.4 mmol/L (ref 3.5–5.1)
Sodium: 137 mmol/L (ref 135–145)

## 2022-03-14 LAB — TROPONIN I (HIGH SENSITIVITY): Troponin I (High Sensitivity): 17 ng/L (ref <18)

## 2022-03-14 LAB — BRAIN NATRIURETIC PEPTIDE: B Natriuretic Peptide: 367.5 pg/mL — ABNORMAL HIGH (ref 0.0–100.0)

## 2022-03-14 NOTE — ED Triage Notes (Signed)
Pt reports SHOB and hypertension that started last night. Pt denies chest pain.

## 2022-03-14 NOTE — ED Notes (Signed)
Pt and daughter approached this nurse at the sort desk and daughter said "I am taking my mother home". Pt and daughter exited the Ed Waiting room.

## 2022-03-14 NOTE — Telephone Encounter (Signed)
Returned call to patient's daughter Hilda Blades.  She states patient is now at home, they left the ED after being there for 8 hours and being informed of an additional 13 hour wait.  She reports patient's BP this AM was 149 systolic and HR 40, prior to leaving ED BP was 144/80 with HR 62.  Hilda Blades states patient had been experiencing SOB this morning, in addition to elevated HR and low HR, prompting them to seek emergent care.  Hilda Blades states patient feels better with stable vital signs currently, they have an appt to see Diona Browner, NP tomorrow (03/15/22) and will keep that appt. Patient has family staying with her overnight.  No further needs at this time.

## 2022-03-14 NOTE — Telephone Encounter (Signed)
Pt's daughter is calling to see if they should continue to wait at the hospital after being told they will have to wait for 13 hours. She states that pt seems stable at this moment and did schedule to see N.P.  tomorrow at 1:55 P.M. at the [facility] office. Please advise.

## 2022-03-14 NOTE — ED Provider Triage Note (Signed)
Emergency Medicine Provider Triage Evaluation Note  MONDA CHASTAIN , a 89 y.o. female  was evaluated in triage.  Pt complains of patient presents with shortness of breath.  She has some chronic issues with shortness of breath and sees a pulmonologist but no official diagnosis of COPD/asthma.  She has a dry cough.  No associated chest pain.  No fevers.  She reports her legs are swollen but I do not appreciate too much swelling on exam..  Review of Systems  Positive: Shortness of breath Negative: Chest pain  Physical Exam  There were no vitals taken for this visit. Gen:   Awake, no distress    Resp:  Normal effort, mild tachypnea, lungs are otherwise clear MSK:   Moves extremities without difficulty minimal swelling to her lower extremities Other:     Medical Decision Making  Medically screening exam initiated at 8:22 AM.  Appropriate orders placed.  DOUGLAS ROOKS was informed that the remainder of the evaluation will be completed by another provider, this initial triage assessment does not replace that evaluation, and the importance of remaining in the ED until their evaluation is complete.      Malvin Johns, MD 03/14/22 [phone]

## 2022-03-15 ENCOUNTER — Encounter: Payer: Self-pay | Admitting: Nurse Practitioner

## 2022-03-15 ENCOUNTER — Ambulatory Visit: Payer: BLUE CROSS/BLUE SHIELD | Attending: Nurse Practitioner | Admitting: Nurse Practitioner

## 2022-03-15 VITALS — BP 138/66 | HR 80 | Ht 65.0 in | Wt 136.0 lb

## 2022-03-15 DIAGNOSIS — I5032 Chronic diastolic (congestive) heart failure: Secondary | ICD-10-CM | POA: Diagnosis not present

## 2022-03-15 DIAGNOSIS — Z8673 Personal history of transient ischemic attack (TIA), and cerebral infarction without residual deficits: Secondary | ICD-10-CM

## 2022-03-15 DIAGNOSIS — R42 Dizziness and giddiness: Secondary | ICD-10-CM

## 2022-03-15 DIAGNOSIS — I480 Paroxysmal atrial fibrillation: Secondary | ICD-10-CM

## 2022-03-15 DIAGNOSIS — E785 Hyperlipidemia, unspecified: Secondary | ICD-10-CM

## 2022-03-15 DIAGNOSIS — R001 Bradycardia, unspecified: Secondary | ICD-10-CM

## 2022-03-15 DIAGNOSIS — R0609 Other forms of dyspnea: Secondary | ICD-10-CM

## 2022-03-15 DIAGNOSIS — R5383 Other fatigue: Secondary | ICD-10-CM | POA: Diagnosis not present

## 2022-03-15 DIAGNOSIS — I471 Supraventricular tachycardia, unspecified: Secondary | ICD-10-CM

## 2022-03-15 DIAGNOSIS — R030 Elevated blood-pressure reading, without diagnosis of hypertension: Secondary | ICD-10-CM

## 2022-03-15 NOTE — Progress Notes (Signed)
Office Visit    Patient Name: Ann Rodriguez Date of Encounter: 03/15/2022  Primary Care Provider:  Antony Contras, MD Primary Cardiologist:  Sinclair Grooms, MD  Chief Complaint    89 year old female with a history of SVT, paroxysmal atrial fibrillation, chronic diastolic heart failure, chronic dyspnea, hyperlipidemia, kyphoscoliosis, and TIA who presents for follow-up related to shortness of breath, elevated BP, bradycardia, and atrial fibrillation.  Past Medical History    Past Medical History:  Diagnosis Date   Arthritis    Bilateral foot-drop 08/31/2020   Depression    PT'S HUSBAND AND SON HAVE DIED W/IN PAST YEAR Jun 25, 2011   GERD (gastroesophageal reflux disease)    Hearing loss    Hyperlipidemia    Idiopathic peripheral neuropathy 07/18/2016   Nocturnal leg cramps 01/22/2017   Stroke ([place])    SVT (supraventricular tachycardia) 09/2014   Thyroid disease    Past Surgical History:  Procedure Laterality Date   APPENDECTOMY     BACK SURGERY     lumbar   BLADDER SURGERY     BUNIONECTOMY     left and right with hammer toe repair   CARPAL TUNNEL RELEASE Bilateral 06/25/2015   ELECTROPHYSIOLOGIC STUDY N/A 11/03/2014   Procedure: SVT Ablation;  Surgeon: Evans Lance, MD;  Location: [facility];  Service: Cardiovascular;  Laterality: N/A;   EYE SURGERY     bilateral cataract extraction with IOL   FOOT SURGERY     KNEE ARTHROSCOPY  02/09/2011   Procedure: ARTHROSCOPY KNEE;  Surgeon: Tobi Bastos;  Location: [facility];  Service: Orthopedics;  Laterality: Left;  Left Knee Arthroscopy with Menisectomy medial, abrasion chondroplasty medial, and synovectomy, supra patella pouch.   NASAL SINUS SURGERY     OOPHORECTOMY     ROTATOR CUFF REPAIR  1/13,  3/13   x 3  right/   SALPINGECTOMY     SHOULDER OPEN ROTATOR CUFF REPAIR Left 06/26/2012   Procedure: LEFT SHOULDER ROTATOR CUFF REPAIR WITH ANCHORS ;  Surgeon: Tobi Bastos, MD;  Location: [facility];  Service: Orthopedics;   Laterality: Left;   TOTAL KNEE ARTHROPLASTY  09/05/2011   Procedure: TOTAL KNEE ARTHROPLASTY;  Surgeon: Tobi Bastos, MD;  Location: [facility];  Service: Orthopedics;  Laterality: Left;    Allergies  No Known Allergies  History of Present Illness    89 year old female with the above past medical history including SVT, paroxysmal atrial fibrillation, chronic diastolic heart failure, chronic dyspnea, hyperlipidemia, kyphoscoliosis, and TIA.  Cardiac monitor in 05/2021 in the setting of palpitations did not reveal any atrial fibrillation/flutter.  There was evidence of SVT/junctional tachycardia.  She was started on metoprolol.  At her follow-up visit in 07/2021 she reported bilateral foot numbness.  Lower extremity arterial duplex revealed good blood flow bilaterally.  She was seen in follow-up in July 2023 in the setting of worsening palpitations.  EKG read atrial fibrillation with PVCs, PE, however, upon further evaluation P waves were thought to be present.  She was hospitalized in 10/2021 for presyncope.  Echocardiogram at the time showed EF 60 to 65%, G1 DD, mildly elevated PASP, mild MR, mild-moderate TR, mild AI.  Zio patch in 10/2021 revealed atrial fibrillation/flutter (8% burden, pt was symptomatic).  She was not ultimately started on Eliquis and flecainide.  EKG at her office visit in 10/2021 revealed wandering atrial pacemaker mimicking atrial fibrillation, and previously scheduled DCCV was canceled. She did note improvement in her palpitations with the flecainide.  She  was last seen in the office on 12/13/2021 and noted chronic dyspnea. She presented to the ED on 03/14/2022 with complaints of shortness of breath, elevated BP.  Chest x-ray was unremarkable.  Troponin was negative.  She waited for hours in the ED but left prior to further evaluation.  She presents today for follow-up accompanied by her daughter.  Since her last visit she has been stable overall though she does note progressive  dyspnea on exertion and occasionally at rest.  She was prescribed new inhalers by her pulmonologist but has not seen any improvement in her dyspnea.  Two nights ago she was in bed and had sudden onset worsening dyspnea, lightheadedness, fatigue and noted an irregular and intermittently slow heart rate, ankle edema. Additionally her BP was elevated.  Her symptoms lasted most of the night and into the next morning at which time she decided to go to the ED.  By the time she went to the ED her symptoms had improved slightly.  She notes some ongoing fatigue and dyspnea.  She denies chest pain, presyncope, syncope.  She is most concerned that despite prior evaluation and current treatments, she continues to have significant dyspnea on exertion.  Home Medications    Current Outpatient Medications  Medication Sig Dispense Refill   acetaminophen (TYLENOL) 500 MG tablet Take 2 tablets (1,000 mg total) by mouth every 6 (six) hours as needed for mild pain or fever.     albuterol (VENTOLIN HFA) 108 (90 Base) MCG/ACT inhaler SMARTSIG:1 Puff(s) By Mouth Every 4-6 Hours PRN     apixaban (ELIQUIS) 5 MG TABS tablet Take 1 tablet (5 mg total) by mouth 2 (two) times daily. 60 tablet 11   atorvastatin (LIPITOR) 40 MG tablet Take 40 mg by mouth daily.     beta carotene w/minerals (OCUVITE) tablet Take 1 tablet by mouth daily with breakfast.     BIOTIN PO Take 1 tablet by mouth daily.     cholecalciferol (VITAMIN D) 1000 UNITS tablet Take 1,000 Units by mouth daily.     escitalopram (LEXAPRO) 10 MG tablet Take 10 mg by mouth daily.  0   flecainide (TAMBOCOR) 50 MG tablet Take 1 tablet (50 mg total) by mouth 2 (two) times daily. 180 tablet 3   Fluticasone-Umeclidin-Vilant (TRELEGY ELLIPTA) 200-62.5-25 MCG/ACT AEPB Inhale 1 puff into the lungs daily. 60 each 5   metoprolol succinate (TOPROL XL) 25 MG 24 hr tablet Take 0.5 tablets (12.5 mg total) by mouth daily. 45 tablet 3   polyethylene glycol (MIRALAX / GLYCOLAX) packet  Take 17 g by mouth daily.     traMADol (ULTRAM) 50 MG tablet Take 50 mg by mouth as needed for moderate pain.     traZODone (DESYREL) 50 MG tablet Take 75 mg by mouth at bedtime as needed for sleep.     vitamin B-12 (CYANOCOBALAMIN) 100 MCG tablet Take 100 mcg by mouth daily.     No current facility-administered medications for this visit.     Review of Systems    She denies chest pain, palpitations, pnd, orthopnea, n, v, syncope, weight gain, or early satiety. All other systems reviewed and are otherwise negative except as noted above.   Physical Exam    VS:  BP 138/66 (BP Location: Right Arm, Patient Position: Sitting, Cuff Size: Normal)   Pulse 80   Ht 5\' 5"  (1.651 m)   Wt 136 lb (61.7 kg)   BMI 22.63 kg/m  GEN: Well nourished, well developed, in no acute distress.  HEENT: normal. Neck: Supple, no JVD, carotid bruits, or masses. Cardiac: IRIR, no murmurs, rubs, or gallops. No clubbing, cyanosis, edema.  Radials/DP/PT 2+ and equal bilaterally.  Respiratory:  Respirations regular and unlabored, clear to auscultation bilaterally. GI: Soft, nontender, nondistended, BS + x 4. MS: no deformity or atrophy. Skin: warm and dry, no rash. Neuro:  Strength and sensation are intact. Psych: Normal affect.  Accessory Clinical Findings    ECG personally reviewed by me today - atrial fibrillation, 66 bpm, incomplete RBBB.   Lab Results  Component Value Date   WBC 6.3 03/14/2022   HGB 12.1 03/14/2022   HCT 36.9 03/14/2022   MCV 95.8 03/14/2022   PLT 247 03/14/2022   Lab Results  Component Value Date   CREATININE 0.60 03/14/2022   BUN 16 03/14/2022   NA 137 03/14/2022   K 4.4 03/14/2022   CL 104 03/14/2022   CO2 26 03/14/2022   Lab Results  Component Value Date   ALT 14 02/06/2021   AST 24 02/06/2021   ALKPHOS 65 02/06/2021   BILITOT 0.9 02/06/2021   Lab Results  Component Value Date   CHOL 161 11/03/2014   HDL 47 11/03/2014   LDLCALC 88 11/03/2014   TRIG 130 11/03/2014    CHOLHDL 3.4 11/03/2014    No results found for: "HGBA1C"  Assessment & Plan    1. Paroxysmal atrial fibrillation/bradycardia/dizziness/fatigue: EKG today shows atrial fibrillation, rate controlled.  She seems to be symptomatic with her atrial fibrillation.  However, she has also noted low heart rates (in the 40s). It is difficult to discern the source of her symptoms (A-fib vs bradycardia, or combination of both).  We discussed possible cardioversion, however, patient states she has missed several doses of her Eliquis over the past few days.  She is on flecainide and metoprolol though she is in A-fib despite flecainide therapy.  I do not see where she has had an ischemic evaluation, however, CT of the chest in 01/2021 showed evidence of moderate severity coronary artery calcification.  Discussed ED precautions, home monitoring with cardia mobile device, possible DCCV if she remains in atrial fibrillation.  Advised adherence to Eliquis.  I will see her back in 3 weeks to discuss next steps. May need to consider repeat cardiac monitor.  Additionally, I will discuss with Dr. Jens Som continuation of flecainide, possible need for further ischemic evaluation as below.  Continue to monitor HR, if bradycardia persist, may need to consider decreasing metoprolol.  For now, continue metoprolol, flecainide, Eliquis.  2. Elevated BP: Occurred during acute episode of dyspnea, this, likely in the setting of atrial fibrillation as above.  BP well-controlled in office today.  Continue current antihypertensive regimen.  3. SVT: S/p ablation.  Continue metoprolol, flecainide.  4. Chronic diastolic heart failure: Most recent echo in 10/2021 showed EF 60 to 65%, G1 DD, mildly elevated PASP, mild MR, mild-moderate TR, mild AI. Euvolemic and well compensated on exam.  Continue metoprolol.  No indication for diuretic at this time.  5. Chronic dyspnea: This is her most worrisome and bothersome symptom today.  She notes  chronic dyspnea (greater than 1 year).  Her symptoms seem to be progressive and occur primarily with exertion, though she does note occasional dyspnea at rest. She denies chest pain. Most recent echo overall reassuring.  She has seen pulmonology who noted mild asthma though she has not seen any improvement in her dyspnea with treatment with inhalers.  She does have a history of kyphoscoliosis, however, she  thinks her symptoms are disproportionate to her anatomy.  Patient states that if there is a problem "I want it fixed."  She does have of moderate severity coronary artery calcification on prior CT chest.  Will discuss with Dr. Stanford Breed, primary cardiologist, recommendation going forward (possible ischemic evaluation). Additionally, advised follow-up with pulmonology.  6. History of TIA: No recurrence.  Continue Eliquis, Lipitor.  7. Hyperlipidemia: LDL was 132 in 11/2021. Continue Lipitor.   8. Disposition: Follow-up in 3 weeks.      Lenna Sciara, NP 03/15/2022, 8:14 PM

## 2022-03-15 NOTE — Patient Instructions (Signed)
Medication Instructions:  Your physician recommends that you continue on your current medications as directed. Please refer to the Current Medication list given to you today.   *If you need a refill on your cardiac medications before your next appointment, please call your pharmacy*   Lab Work: NONE ordered at this time of appointment   If you have labs (blood work) drawn today and your tests are completely normal, you will receive your results only by: [place] (if you have MyChart) OR A paper copy in the mail If you have any lab test that is abnormal or we need to change your treatment, we will call you to review the results.   Testing/Procedures: NONE ordered at this time of appointment     Follow-Up: At [facility], you and your health needs are our priority.  As part of our continuing mission to provide you with exceptional heart care, we have created designated Provider Care Teams.  These Care Teams include your primary Cardiologist (physician) and Advanced Practice Providers (APPs -  Physician Assistants and Nurse Practitioners) who all work together to provide you with the care you need, when you need it.  We recommend signing up for the patient portal called "MyChart".  Sign up information is provided on this After Visit Summary.  MyChart is used to connect with patients for Virtual Visits (Telemedicine).  Patients are able to view lab/test results, encounter notes, upcoming appointments, etc.  Non-urgent messages can be sent to your provider as well.   To learn more about what you can do with MyChart, go to [email or web address].    Your next appointment:   3 week(s)  The format for your next appointment:   In Person  Provider:   Diona Browner, NP        Other Instructions [employer] for home monitoring   Important Information About Sugar

## 2022-03-19 ENCOUNTER — Ambulatory Visit: Payer: BLUE CROSS/BLUE SHIELD | Attending: Nurse Practitioner

## 2022-03-19 DIAGNOSIS — I480 Paroxysmal atrial fibrillation: Secondary | ICD-10-CM

## 2022-03-19 NOTE — Progress Notes (Unsigned)
Enrolled for [employer] to mail a ZIO XT long term holter monitor to the patients address on file.   Dr. Crenshaw to read. 

## 2022-03-22 DIAGNOSIS — I480 Paroxysmal atrial fibrillation: Secondary | ICD-10-CM

## 2022-03-27 ENCOUNTER — Telehealth: Payer: Self-pay | Admitting: Cardiology

## 2022-03-27 DIAGNOSIS — I5032 Chronic diastolic (congestive) heart failure: Secondary | ICD-10-CM | POA: Diagnosis not present

## 2022-03-27 DIAGNOSIS — E782 Mixed hyperlipidemia: Secondary | ICD-10-CM | POA: Diagnosis not present

## 2022-03-27 DIAGNOSIS — J4530 Mild persistent asthma, uncomplicated: Secondary | ICD-10-CM | POA: Diagnosis not present

## 2022-03-27 DIAGNOSIS — J449 Chronic obstructive pulmonary disease, unspecified: Secondary | ICD-10-CM | POA: Diagnosis not present

## 2022-03-27 NOTE — Telephone Encounter (Signed)
Pt c/o medication issue:  1. Name of Medication:   apixaban (ELIQUIS) 5 MG TABS tablet   2. How are you currently taking this medication (dosage and times per day)? As prescribed  3. Are you having a reaction (difficulty breathing--STAT)?   4. What is your medication issue?   Patient stated she missed a dose of this medication yesterday morning and she is concerned this will affect her upcoming test.

## 2022-03-27 NOTE — Telephone Encounter (Signed)
Patient stated she missed her morning dose of eliquis yesterday morning. She is concerned because she is to have cardioversion. Please advise.

## 2022-03-27 NOTE — Telephone Encounter (Signed)
Spoke with patient and informed her of E. Monge's response: "Patient is not currently scheduled for cardioversion.  I advised her to continue her Eliquis and to not miss any doses in case she were to need cardioversion in the future.  This will restart the 3-week timeframe (as of today 03/27/2022) for which she would need uninterrupted anticoagulation prior to proceeding with possible cardioversion.  Please continue Eliquis (do not miss any further doses), and follow-up as planned."   The reason for not missing any doses of eliquis was explained. Patient verbalized understanding. States she has exertional SOB and feels palpitations. While on phone BP 135/80, P 80. With resting for her BP, she was not SOB.

## 2022-03-28 ENCOUNTER — Telehealth: Payer: Self-pay | Admitting: Pulmonary Disease

## 2022-03-28 NOTE — Telephone Encounter (Signed)
Please schedule for sooner appointment 

## 2022-03-28 NOTE — Telephone Encounter (Signed)
Scheduled for 1/26 with JE. Pt needs to confirm this switch with her daughter. If this does not work, please keep offering appts that are sooner than 2/19. Please also advise pt to discontinue her use of trelegy until appt with JE.

## 2022-03-28 NOTE — Telephone Encounter (Signed)
Patient would like a call from the nurse because she is having major breathing difficulty and she said her Trelegy is not helping.  She has an upcoming appt. On 02/19 and would like to be seen sooner.  Please advise and call patient asap to discuss at [phone].

## 2022-03-28 NOTE — Telephone Encounter (Signed)
Spk with pt states her trelegy is causing her to cough and she's unable to get the full dose. Pt declined spacer

## 2022-04-04 DIAGNOSIS — I480 Paroxysmal atrial fibrillation: Secondary | ICD-10-CM | POA: Diagnosis not present

## 2022-04-06 ENCOUNTER — Ambulatory Visit (HOSPITAL_BASED_OUTPATIENT_CLINIC_OR_DEPARTMENT_OTHER): Payer: Medicare Other | Admitting: Pulmonary Disease

## 2022-04-06 ENCOUNTER — Encounter: Payer: Self-pay | Admitting: Nurse Practitioner

## 2022-04-06 ENCOUNTER — Ambulatory Visit: Payer: Medicare Other | Attending: Nurse Practitioner | Admitting: Nurse Practitioner

## 2022-04-06 VITALS — BP 118/78 | HR 105 | Ht 65.0 in | Wt 137.0 lb

## 2022-04-06 DIAGNOSIS — R030 Elevated blood-pressure reading, without diagnosis of hypertension: Secondary | ICD-10-CM

## 2022-04-06 DIAGNOSIS — I5032 Chronic diastolic (congestive) heart failure: Secondary | ICD-10-CM

## 2022-04-06 DIAGNOSIS — I2510 Atherosclerotic heart disease of native coronary artery without angina pectoris: Secondary | ICD-10-CM

## 2022-04-06 DIAGNOSIS — E785 Hyperlipidemia, unspecified: Secondary | ICD-10-CM

## 2022-04-06 DIAGNOSIS — R5383 Other fatigue: Secondary | ICD-10-CM

## 2022-04-06 DIAGNOSIS — I480 Paroxysmal atrial fibrillation: Secondary | ICD-10-CM | POA: Diagnosis not present

## 2022-04-06 DIAGNOSIS — R0609 Other forms of dyspnea: Secondary | ICD-10-CM

## 2022-04-06 DIAGNOSIS — R001 Bradycardia, unspecified: Secondary | ICD-10-CM

## 2022-04-06 DIAGNOSIS — I471 Supraventricular tachycardia, unspecified: Secondary | ICD-10-CM

## 2022-04-06 DIAGNOSIS — R42 Dizziness and giddiness: Secondary | ICD-10-CM

## 2022-04-06 DIAGNOSIS — Z8673 Personal history of transient ischemic attack (TIA), and cerebral infarction without residual deficits: Secondary | ICD-10-CM

## 2022-04-06 NOTE — Progress Notes (Signed)
Office Visit    Patient Name: Ann Rodriguez Date of Encounter: 04/06/2022  Primary Care Provider:  Tally Joe, MD Primary Cardiologist:  Olga Millers, MD  Chief Complaint    89 year old female with a history of SVT, paroxysmal atrial fibrillation, chronic diastolic heart failure, chronic dyspnea, hyperlipidemia, kyphoscoliosis,  and TIA who presents for follow-up related to shortness of breath, SVT and atrial fibrillation.   Past Medical History    Past Medical History:  Diagnosis Date   Arthritis    Bilateral foot-drop 08/31/2020   Depression    PT'S HUSBAND AND SON HAVE DIED W/IN PAST YEAR 07-23-2011   GERD (gastroesophageal reflux disease)    Hearing loss    Hyperlipidemia    Idiopathic peripheral neuropathy 07/18/2016   Nocturnal leg cramps 01/22/2017   Stroke (HCC)    SVT (supraventricular tachycardia) 09/2014   Thyroid disease    Past Surgical History:  Procedure Laterality Date   APPENDECTOMY     BACK SURGERY     lumbar   BLADDER SURGERY     BUNIONECTOMY     left and right with hammer toe repair   CARPAL TUNNEL RELEASE Bilateral 2015-07-23   ELECTROPHYSIOLOGIC STUDY N/A 11/03/2014   Procedure: SVT Ablation;  Surgeon: Marinus Maw, MD;  Location: [facility] INVASIVE CV LAB;  Service: Cardiovascular;  Laterality: N/A;   EYE SURGERY     bilateral cataract extraction with IOL   FOOT SURGERY     KNEE ARTHROSCOPY  02/09/2011   Procedure: ARTHROSCOPY KNEE;  Surgeon: Jacki Cones;  Location: [facility];  Service: Orthopedics;  Laterality: Left;  Left Knee Arthroscopy with Menisectomy medial, abrasion chondroplasty medial, and synovectomy, supra patella pouch.   NASAL SINUS SURGERY     OOPHORECTOMY     ROTATOR CUFF REPAIR  1/13,  3/13   x 3  right/   SALPINGECTOMY     SHOULDER OPEN ROTATOR CUFF REPAIR Left 06/26/2012   Procedure: LEFT SHOULDER ROTATOR CUFF REPAIR WITH ANCHORS ;  Surgeon: Jacki Cones, MD;  Location: [facility];  Service: Orthopedics;  Laterality: Left;   TOTAL KNEE  ARTHROPLASTY  09/05/2011   Procedure: TOTAL KNEE ARTHROPLASTY;  Surgeon: Jacki Cones, MD;  Location: [facility];  Service: Orthopedics;  Laterality: Left;    Allergies  No Known Allergies   Labs/Other Studies Reviewed    The following studies were reviewed today: Zio May 02, 2022:   Patch Wear Time:  7 days and 23 hours (2024-01-11T21:22:59-0500 to 2024-01-19T21:14:29-0500)   Patient had a min HR of 49 bpm, max HR of 210 bpm, and avg HR of 73 bpm. Predominant underlying rhythm was Sinus Rhythm. QRS morphology changes were present throughout recording. 3164 Supraventricular Tachycardia runs occurred, the run with the fastest  interval lasting 14 beats with a max rate of 210 bpm, the longest lasting 43.0 secs with an avg rate of 143 bpm. True duration of Supraventricular Tachycardia difficult to ascertain due to artifact. Supraventricular Tachycardia was detected within +/- 45  seconds of symptomatic patient event(s). Isolated SVEs were frequent (8.7%, [id number]), SVE Couplets were occasional (4.2%, 16056), and SVE Triplets were occasional (4.0%, 10016). Isolated VEs were occasional (1.0%, 7977), VE Couplets were rare (<1.0%,  392), and VE Triplets were rare (<1.0%, 67).   Sinus bradycardia, normal sinus rhythm, sinus tachycardia, PACs, PVCs, brief PAT and 3 beats nonsustained ventricular tachycardia. Olga Millers  Echo 10/19/2021: IMPRESSIONS   1. Left ventricular ejection fraction, by estimation, is 60 to 65%. The  left ventricle  has normal function. The left ventricle has no regional  wall motion abnormalities. Left ventricular diastolic parameters are  consistent with Grade I diastolic  dysfunction (impaired relaxation).   2. Right ventricular systolic function is mildly reduced. The right  ventricular size is mildly enlarged. There is mildly elevated pulmonary  artery systolic pressure.   3. Left atrial size was mildly dilated.   4. The mitral valve is normal in structure. Mild mitral  valve  regurgitation. No evidence of mitral stenosis.   5. Tricuspid valve regurgitation is mild to moderate.   6. The aortic valve is grossly normal. There is mild calcification of the  aortic valve. Aortic valve regurgitation is mild. No aortic stenosis is  present.   7. The inferior vena cava is normal in size with greater than 50%  respiratory variability, suggesting right atrial pressure of 3 mmHg.   Recent Labs: 10/02/2021: TSH 2.980 10/18/2021: Magnesium 2.0 03/14/2022: B Natriuretic Peptide 367.5; BUN 16; Creatinine, Ser 0.60; Hemoglobin 12.1; Platelets 247; Potassium 4.4; Sodium 137  Recent Lipid Panel    Component Value Date/Time   CHOL 161 11/03/2014 0450   TRIG 130 11/03/2014 0450   HDL 47 11/03/2014 0450   CHOLHDL 3.4 11/03/2014 0450   VLDL 26 11/03/2014 0450   LDLCALC 88 11/03/2014 0450    History of Present Illness    89 year old female with the above past medical history including SVT, paroxysmal atrial fibrillation, chronic diastolic heart failure, chronic dyspnea, hyperlipidemia, kyphoscoliosis, and TIA.   Cardiac monitor in 05/2021 in the setting of palpitations did not reveal any atrial fibrillation/flutter.  There was evidence of SVT/junctional tachycardia.  She was started on metoprolol.  At her follow-up visit in 07/2021 she reported bilateral foot numbness.  Lower extremity arterial duplex revealed good blood flow bilaterally.  She was seen in follow-up in July 2023 in the setting of worsening palpitations.  EKG read atrial fibrillation with PVCs, PE, however, upon further evaluation P waves were thought to be present. She was hospitalized in 10/2021 for presyncope.  Echocardiogram at the time showed EF 60 to 65%, G1 DD, mildly elevated PASP, mild MR, mild-moderate TR, mild AI.  Zio patch in 10/2021 revealed atrial fibrillation/flutter (8% burden, pt was symptomatic).  She was not ultimately started on Eliquis and flecainide.  EKG at her office visit in 10/2021 revealed  wandering atrial pacemaker mimicking atrial fibrillation, and previously scheduled DCCV was canceled. She did note improvement in her palpitations with flecainide. She presented to the ED on 03/14/2022 with complaints of shortness of breath, elevated BP.  Chest x-ray was unremarkable.  Troponin was negative.  She waited for hours in the ED but left prior to further evaluation. She was last seen in the office on 03/15/2022 with progressive dyspnea on exertion and occasionally at rest, irregular heart rate.  EKG showed rate controlled atrial fibrillation.  DCCV was deferred in the setting of missed doses of Eliquis.  Flecainide was discontinued given evidence of moderate severity during calcifications noted on prior CT.  Repeat 7-day ZIO revealed predominantly normal sinus rhythm, sinus bradycardia, tachycardia, frequent PVCs and PACs, and frequent SVT (patient symptomatic within 45 seconds of SVT).  She presents today for follow-up accompanied by her daughter.  Since her last visit she has stable from a cardiac standpoint.  She denies any significant palpitations, she has been monitoring her HR with a Kardia mobile device which has showed "possible atrial fibrillation."  She continues to have dyspnea on exertion, she denies chest pain.  She has follow-up scheduled with pulmonology in early February.  Overall, her symptoms have been stable.  Home Medications    Current Outpatient Medications  Medication Sig Dispense Refill   acetaminophen (TYLENOL) 500 MG tablet Take 2 tablets (1,000 mg total) by mouth every 6 (six) hours as needed for mild pain or fever.     albuterol (VENTOLIN HFA) 108 (90 Base) MCG/ACT inhaler SMARTSIG:1 Puff(s) By Mouth Every 4-6 Hours PRN     apixaban (ELIQUIS) 5 MG TABS tablet Take 1 tablet (5 mg total) by mouth 2 (two) times daily. 60 tablet 11   atorvastatin (LIPITOR) 40 MG tablet Take 40 mg by mouth daily.     beta carotene w/minerals (OCUVITE) tablet Take 1 tablet by mouth daily  with breakfast.     BIOTIN PO Take 1 tablet by mouth daily.     cholecalciferol (VITAMIN D) 1000 UNITS tablet Take 1,000 Units by mouth daily.     escitalopram (LEXAPRO) 10 MG tablet Take 10 mg by mouth daily.  0   Fluticasone-Umeclidin-Vilant (TRELEGY ELLIPTA) 200-62.5-25 MCG/ACT AEPB Inhale 1 puff into the lungs daily. 60 each 5   metoprolol succinate (TOPROL XL) 25 MG 24 hr tablet Take 0.5 tablets (12.5 mg total) by mouth daily. (Patient taking differently: Take 25 mg by mouth daily.) 45 tablet 3   polyethylene glycol (MIRALAX / GLYCOLAX) packet Take 17 g by mouth daily.     traMADol (ULTRAM) 50 MG tablet Take 50 mg by mouth as needed for moderate pain.     traZODone (DESYREL) 50 MG tablet Take 75 mg by mouth at bedtime as needed for sleep.     vitamin B-12 (CYANOCOBALAMIN) 100 MCG tablet Take 100 mcg by mouth daily.     No current facility-administered medications for this visit.     Review of Systems    She denies chest pain, pnd, orthopnea, n, v, dizziness, syncope, weight gain, or early satiety. All other systems reviewed and are otherwise negative except as noted above.   Physical Exam    VS:  BP 118/78   Pulse (!) 105   Ht 5\' 5"  (1.651 m)   Wt 137 lb (62.1 kg)   SpO2 97%   BMI 22.80 kg/m   GEN: Well nourished, well developed, in no acute distress. HEENT: normal. Neck: Supple, no JVD, carotid bruits, or masses. Cardiac: RRR c/ ectopic beats, no murmurs, rubs, or gallops. No clubbing, cyanosis, edema.  Radials/DP/PT 2+ and equal bilaterally.  Respiratory:  Respirations regular and unlabored, clear to auscultation bilaterally. GI: Soft, nontender, nondistended, BS + x 4. MS: no deformity or atrophy. Skin: warm and dry, no rash. Neuro:  Strength and sensation are intact. Psych: Normal affect.  Accessory Clinical Findings    ECG personally reviewed by me today -NSR 105 bpm, frequent PACs and PVCs- no acute changes.   Lab Results  Component Value Date   WBC 6.3  03/14/2022   HGB 12.1 03/14/2022   HCT 36.9 03/14/2022   MCV 95.8 03/14/2022   PLT 247 03/14/2022   Lab Results  Component Value Date   CREATININE 0.60 03/14/2022   BUN 16 03/14/2022   NA 137 03/14/2022   K 4.4 03/14/2022   CL 104 03/14/2022   CO2 26 03/14/2022   Lab Results  Component Value Date   ALT 14 02/06/2021   AST 24 02/06/2021   ALKPHOS 65 02/06/2021   BILITOT 0.9 02/06/2021   Lab Results  Component Value Date   CHOL 161 11/03/2014  HDL 47 11/03/2014   LDLCALC 88 11/03/2014   TRIG 130 11/03/2014   CHOLHDL 3.4 11/03/2014    No results found for: "HGBA1C"  Assessment & Plan   1. Paroxysmal atrial fibrillation/bradycardia/dizziness/fatigue/PSVT: S/p prior SVT ablation. Recent 7-day ZIO revealed predominantly normal sinus rhythm, sinus bradycardia, tachycardia, frequent PVCs and PACs, and frequent SVT (patient symptomatic within 45 seconds of SVT), no atrial fibrillation.  Flecainide was recently discontinued in the setting of coronary artery calcification noted on prior CT.  Will increase metoprolol to 25 mg daily.  If she does not tolerate increased metoprolol dosing or continues to have significant symptoms, could consider referral to EP. Continue metoprolol, Eliquis.  2. Chronic dyspnea/coronary artery calcification on CT: She notes chronic dyspnea (greater than 1 year).  Her symptoms seem to be progressive and occur primarily with exertion, though she does note occasional dyspnea at rest. She denies chest pain. Most recent echo overall reassuring.  She has seen pulmonology who noted mild asthma though she has not seen any improvement in her dyspnea with treatment with inhalers.  She does have a history of kyphoscoliosis, however, she thinks her symptoms are disproportionate to her anatomy.  She does have moderate severity coronary artery calcification on prior CT chest.  Through shared decision-making, will pursue Lexiscan Myoview. Additionally, advised follow-up with  pulmonology.   Shared Decision Making/Informed Consent The risks [chest pain, shortness of breath, cardiac arrhythmias, dizziness, blood pressure fluctuations, myocardial infarction, stroke/transient ischemic attack, nausea, vomiting, allergic reaction, radiation exposure, metallic taste sensation and life-threatening complications (estimated to be 1 in 10,000)], benefits (risk stratification, diagnosing coronary artery disease, treatment guidance) and alternatives of a nuclear stress test were discussed in detail with Ms. Ewton and she agrees to proceed.  3. Elevated BP:  BP well-controlled in office today.  Continue current antihypertensive regimen.  4. Chronic diastolic heart failure: Most recent echo in 10/2021 showed EF 60 to 65%, G1 DD, mildly elevated PASP, mild MR, mild-moderate TR, mild AI.  She does have mild nonpitting bilateral lower extremity edema.  Otherwise, euvolemic and well compensated on exam.  She declines diuretic therapy.  Encouraged compression, elevation.  Continue metoprolol.    5. History of TIA: No recurrence. Continue Eliquis, Lipitor.   6. Hyperlipidemia: LDL was 132 in 11/2021. Continue Lipitor.    7. Disposition: Follow-up as scheduled with Dr. Stanford Breed in 05/2022.     Lenna Sciara, NP 04/06/2022, 5:09 PM

## 2022-04-06 NOTE — Patient Instructions (Addendum)
Medication Instructions:  Increase Metoprolol 25 mg daily  *If you need a refill on your cardiac medications before your next appointment, please call your pharmacy*   Lab Work: NONE ordered at this time of appointment   If you have labs (blood work) drawn today and your tests are completely normal, you will receive your results only by: [facility] (if you have MyChart) OR A paper copy in the mail If you have any lab test that is abnormal or we need to change your treatment, we will call you to review the results.   Testing/Procedures: Your physician has requested that you have a lexiscan myoview. For further information please visit [email or web address]. Please follow instruction sheet, as given.    Follow-Up: At [facility], you and your health needs are our priority.  As part of our continuing mission to provide you with exceptional heart care, we have created designated Provider Care Teams.  These Care Teams include your primary Cardiologist (physician) and Advanced Practice Providers (APPs -  Physician Assistants and Nurse Practitioners) who all work together to provide you with the care you need, when you need it.  We recommend signing up for the patient portal called "MyChart".  Sign up information is provided on this After Visit Summary.  MyChart is used to connect with patients for Virtual Visits (Telemedicine).  Patients are able to view lab/test results, encounter notes, upcoming appointments, etc.  Non-urgent messages can be sent to your provider as well.   To learn more about what you can do with MyChart, go to [email or web address].    Your next appointment:    Keep follow up   Provider:   Kirk Ruths, MD     Other Instructions

## 2022-04-09 NOTE — Telephone Encounter (Signed)
Patient given detailed instructions per Myocardial Perfusion Study Information Sheet for the test on 04/10/2022 at 10:30. Patient notified to arrive 15 minutes early and that it is imperative to arrive on time for appointment to keep from having the test rescheduled.  If you need to cancel or reschedule your appointment, please call the office within 24 hours of your appointment. . Patient verbalized understanding.EHK

## 2022-04-10 ENCOUNTER — Ambulatory Visit (HOSPITAL_COMMUNITY): Payer: Medicare Other | Attending: Cardiology

## 2022-04-10 DIAGNOSIS — R0609 Other forms of dyspnea: Secondary | ICD-10-CM | POA: Insufficient documentation

## 2022-04-10 DIAGNOSIS — I2510 Atherosclerotic heart disease of native coronary artery without angina pectoris: Secondary | ICD-10-CM | POA: Insufficient documentation

## 2022-04-10 LAB — MYOCARDIAL PERFUSION IMAGING
LV dias vol: 77 mL (ref 46–106)
LV sys vol: 49 mL
Nuc Stress EF: 36 %
Peak HR: 109 {beats}/min
Rest HR: 73 {beats}/min
Rest Nuclear Isotope Dose: 10.7 mCi
SDS: 4
SRS: 1
SSS: 5
ST Depression (mm): 0 mm
Stress Nuclear Isotope Dose: 31.5 mCi
TID: 0.92

## 2022-04-10 MED ORDER — REGADENOSON 0.4 MG/5ML IV SOLN
0.4000 mg | Freq: Once | INTRAVENOUS | Status: AC
  Administered 2022-04-10: 0.4 mg via INTRAVENOUS

## 2022-04-10 MED ORDER — TECHNETIUM TC 99M TETROFOSMIN IV KIT
31.5000 | PACK | Freq: Once | INTRAVENOUS | Status: AC | PRN
  Administered 2022-04-10: 31.5 via INTRAVENOUS

## 2022-04-10 MED ORDER — TECHNETIUM TC 99M TETROFOSMIN IV KIT
10.7000 | PACK | Freq: Once | INTRAVENOUS | Status: AC | PRN
  Administered 2022-04-10: 10.7 via INTRAVENOUS

## 2022-04-12 ENCOUNTER — Ambulatory Visit (INDEPENDENT_AMBULATORY_CARE_PROVIDER_SITE_OTHER): Payer: Medicare Other | Admitting: Pulmonary Disease

## 2022-04-12 ENCOUNTER — Encounter (HOSPITAL_BASED_OUTPATIENT_CLINIC_OR_DEPARTMENT_OTHER): Payer: Self-pay | Admitting: Pulmonary Disease

## 2022-04-12 ENCOUNTER — Telehealth: Payer: Self-pay

## 2022-04-12 VITALS — BP 110/62 | HR 60 | Ht 65.0 in | Wt 138.1 lb

## 2022-04-12 DIAGNOSIS — J4530 Mild persistent asthma, uncomplicated: Secondary | ICD-10-CM | POA: Diagnosis not present

## 2022-04-12 DIAGNOSIS — I480 Paroxysmal atrial fibrillation: Secondary | ICD-10-CM

## 2022-04-12 MED ORDER — BREZTRI AEROSPHERE 160-9-4.8 MCG/ACT IN AERO: 2.0000 | INHALATION_SPRAY | Freq: Two times a day (BID) | RESPIRATORY_TRACT | 5 refills | Status: DC

## 2022-04-12 NOTE — Progress Notes (Signed)
Subjective:   PATIENT ID: Ann Rodriguez GENDER: female DOB: Dec 29, 1928, MRN: [id number]   HPI  Chief Complaint  Patient presents with   Follow-up    Still sob had stress test 2days ago    Reason for Visit: Follow-up   Ann Rodriguez is a 89 year old female with kyphoscoliosis, atrial fibrillation, SVT status post ablation 2025, chronic diastolic heart failure, hx TIA, HLD, prior sinus surgery who presents for follow-up  Synopsis: Initially presented for longstanding shortness of breath for over a year. Denies associated chest pain, cough, wheezing or sputum production. Reports her symptoms have limited her ability to clean the house like she used to. PCP and Cardiology work-up has revealed chronic diastolic heart failure otherwise negative for etiology.   04/2020 Since her last visit, her symptoms of dyspnea have remained unchanged.  Occurs with exertion.  Has limited her activities.  She was previously very active participating in water aerobics.  During the pandemic she has not been able to engage as often as she would like.  She did have a recent fall while doing yoga and has modified her movements by using a chair.  Does report benefit after starting this activity.  Denies any associated wheezing.  Does have a dry cough.  Symptoms do not seem different from day or night.  02/19/22 Last visit was >1 year ago. Since our last visit she reports shortness of breath including with simple activities including while combing her hair. Denies any at rest but with any exertion including. Denies cough and wheezing. Has been on Breo for one month but not improved and albuterol. Uses albuterol 3-4 times a day. Taking mucinex for congestion.  04/12/22 Since our last visit Trelegy was tried but she continued to have shortness of breath due to inability to take full dose of inhaler. Continues to have shortness of breath with walking and using her arms for tasks like combing her hair. She recently had a  stress test this week that was low risk but was concerning for new low EF. Plans for repeat echocardiogram. She has been doing chair exercises including upper body exercises.  Social History: Significant exposure from her spouse. He passed with COPD Social smoker. Quit in 1960. Possibly 3 pack-years at most   Past Medical History:  Diagnosis Date   Arthritis    Bilateral foot-drop 08/31/2020   Depression    PT'S HUSBAND AND SON HAVE DIED W/IN PAST YEAR 2013   GERD (gastroesophageal reflux disease)    Hearing loss    Hyperlipidemia    Idiopathic peripheral neuropathy 07/18/2016   Nocturnal leg cramps 01/22/2017   Stroke ([place])    SVT (supraventricular tachycardia) 09/2014   Thyroid disease    No Known Allergies   Outpatient Medications Prior to Visit  Medication Sig Dispense Refill   acetaminophen (TYLENOL) 500 MG tablet Take 2 tablets (1,000 mg total) by mouth every 6 (six) hours as needed for mild pain or fever.     albuterol (VENTOLIN HFA) 108 (90 Base) MCG/ACT inhaler SMARTSIG:1 Puff(s) By Mouth Every 4-6 Hours PRN     apixaban (ELIQUIS) 5 MG TABS tablet Take 1 tablet (5 mg total) by mouth 2 (two) times daily. 60 tablet 11   atorvastatin (LIPITOR) 40 MG tablet Take 40 mg by mouth daily.     beta carotene w/minerals (OCUVITE) tablet Take 1 tablet by mouth daily with breakfast.     BIOTIN PO Take 1 tablet by mouth daily.     cholecalciferol (  VITAMIN D) 1000 UNITS tablet Take 1,000 Units by mouth daily.     escitalopram (LEXAPRO) 10 MG tablet Take 10 mg by mouth daily.  0   metoprolol succinate (TOPROL XL) 25 MG 24 hr tablet Take 0.5 tablets (12.5 mg total) by mouth daily. (Patient taking differently: Take 25 mg by mouth daily.) 45 tablet 3   polyethylene glycol (MIRALAX / GLYCOLAX) packet Take 17 g by mouth daily.     traMADol (ULTRAM) 50 MG tablet Take 50 mg by mouth as needed for moderate pain.     traZODone (DESYREL) 50 MG tablet Take 75 mg by mouth at bedtime as needed for sleep.      vitamin B-12 (CYANOCOBALAMIN) 100 MCG tablet Take 100 mcg by mouth daily.     Fluticasone-Umeclidin-Vilant (TRELEGY ELLIPTA) 200-62.5-25 MCG/ACT AEPB Inhale 1 puff into the lungs daily. 60 each 5   No facility-administered medications prior to visit.    Review of Systems  Constitutional:  Negative for chills, diaphoresis, fever, malaise/fatigue and weight loss.  HENT:  Negative for congestion.   Respiratory:  Positive for shortness of breath. Negative for cough, hemoptysis, sputum production and wheezing.   Cardiovascular:  Negative for chest pain, palpitations and leg swelling.     Objective:   Vitals:   04/12/22 0915  BP: 110/62  Pulse: 60  SpO2: 97%  Weight: 138 lb 1.9 oz (62.6 kg)  Height: 5\' 5"  (1.651 m)   SpO2: 97 % O2 Device: None (Room air)  Physical Exam: General: Well-appearing, no acute distress HENT: [place], AT Eyes: EOMI, no scleral icterus Respiratory: Clear to auscultation bilaterally.  No crackles, wheezing or rales Cardiovascular: RRR, -M/R/G, no JVD Extremities:-Edema,-tenderness Neuro: AAO x4, CNII-XII grossly intact Psych: Normal mood, normal affect  Data Reviewed:  Imaging: CTA 02/18/13 - No PE. Background emphysema CXR Ribs 07/25/19 Remote left rib fractures 5-7. Subacute left 10th rib fracture.  CT Chest 02/06/21 - mild scarring in RUL and bilateral lower lobes CXR 10/18/21 - Cardiomegaly. No acute infiltrate, effusion or edema CXR 03/14/22 - Cardiomegaly. Kyphoscoliosis. No acute infiltrate, effusion or edema.  PFT: 10/28/14 FVC 2.81 (109%) FEV1 2.2 (116%) Ratio 70  TLC 95% DLCO 75% Interpretation: Mild obstructive defect with mildly reduced DLCO  04/21/20 FVC 2.1 (90%) FEV1 1.69 (98%) Ratio 74  TLC 84% DLCO 78% Interpretation: No obstruction on spirometry however bronchodilator response significant in FEV1. Normal lung volumes. Reduced gas exchange.  Labs: CBC    Component Value Date/Time   WBC 6.3 03/14/2022 0840   RBC 3.85 (L) 03/14/2022  0840   HGB 12.1 03/14/2022 0840   HGB 13.8 10/02/2021 1553   HCT 36.9 03/14/2022 0840   HCT 42.9 10/02/2021 1553   PLT 247 03/14/2022 0840   PLT 247 10/02/2021 1553   MCV 95.8 03/14/2022 0840   MCV 96 10/02/2021 1553   MCH 31.4 03/14/2022 0840   MCHC 32.8 03/14/2022 0840   RDW 13.5 03/14/2022 0840   RDW 13.1 10/02/2021 1553   LYMPHSABS 0.9 03/14/2022 0840   MONOABS 0.7 03/14/2022 0840   EOSABS 0.1 03/14/2022 0840   BASOSABS 0.0 03/14/2022 0840      Assessment & Plan:   Discussion: 89 year old female with remote smoking history who initially presented with progressive shortness of breath >2 years who presents for follow-up.  Prior PFTs demonstrate obstructive defect on CTA in 2014 commenting on emphysema.  She also has mild kyphoscoliosis and history of rib fractures that can contribute to dyspnea.  Deconditioning is also  playing a role since she has decreased activity for many years.  ICS/LABA has been ineffective since she is unable to take inhaler. Currently participating in chair exercises. Further exercises limited due to balance issues  Mild Persistent Asthma --STOP Trelegy 200 --START Breztri TWO puffs in the morning and the evening. Rinse mouth out after use to prevent thrush --CONTINUE Albuterol TWO puffs ONCE a day. This is your as needed --CONTINUE regular exercise. Encourage 10,000 steps a day. Start upper body exercises with weights.  --In the future if you continue to have balance issues, we can refer to physical therapy  Kyphoscoliosis Remote rib fractures Deconditioning --Supportive care --Encourage regular activity as tolerated.  OK to do yoga with chair  Health Maintenance Immunization History  Administered Date(s) Administered   Influenza Split 11/20/2007   Influenza, High Dose Seasonal PF 11/24/2009, 11/06/2010, 12/10/2011, 11/17/2012, 12/10/2013, 12/03/2014, 12/12/2015, 11/01/2016, 12/12/2017, 11/20/2018, 11/12/2019   PFIZER(Purple Top)SARS-COV-2  Vaccination 04/02/2019, 04/23/2019, 12/16/2019   Pneumococcal Conjugate-13 05/10/2014   Pneumococcal Polysaccharide-23 03/17/2009   CT Lung Screen - not indicated  No orders of the defined types were placed in this encounter.  Meds ordered this encounter  Medications   Budeson-Glycopyrrol-Formoterol (BREZTRI AEROSPHERE) 160-9-4.8 MCG/ACT AERO    Sig: Inhale 2 puffs into the lungs 2 (two) times daily.    Dispense:  10.7 g    Refill:  5   Return in about 2 months (around 06/11/2022).  I have spent a total time of 33-minutes on the day of the appointment including chart review, data review, collecting history, coordinating care and discussing medical diagnosis and plan with the patient/family. Past medical history, allergies, medications were reviewed. Pertinent imaging, labs and tests included in this note have been reviewed and interpreted independently by me.   [place], MD [facility] 04/12/2022 9:48 AM  Office Number [phone]

## 2022-04-12 NOTE — Patient Instructions (Addendum)
Mild Persistent Asthma --STOP Trelegy 200 --START Breztri TWO puffs in the morning and the evening. Rinse mouth out after use to prevent thrush --CONTINUE Albuterol TWO puffs ONCE a day. This is your as needed --CONTINUE regular exercise. Encourage 10,000 steps a day. Continue upper body exercises with weights.  --In the future if you continue to have balance issues, we can refer to physical therapy  Follow-up with me in late March/April

## 2022-04-12 NOTE — Telephone Encounter (Signed)
Spoke with pt. Pt was notified of stress test results. Pt needs an Echo per Diona Browner NP.

## 2022-04-30 ENCOUNTER — Ambulatory Visit (HOSPITAL_BASED_OUTPATIENT_CLINIC_OR_DEPARTMENT_OTHER): Payer: Medicare Other | Admitting: Pulmonary Disease

## 2022-05-03 ENCOUNTER — Ambulatory Visit (HOSPITAL_COMMUNITY): Payer: Medicare Other | Attending: Nurse Practitioner

## 2022-05-03 DIAGNOSIS — I480 Paroxysmal atrial fibrillation: Secondary | ICD-10-CM | POA: Diagnosis not present

## 2022-05-03 LAB — ECHOCARDIOGRAM COMPLETE
Area-P 1/2: 4.53 cm2
MV M vel: 4.26 m/s
MV Peak grad: 72.5 mmHg
P 1/2 time: 493 msec
S' Lateral: 3 cm

## 2022-05-08 ENCOUNTER — Telehealth: Payer: Self-pay

## 2022-05-08 NOTE — Telephone Encounter (Signed)
Spoke with pt. Pt was notified of echo results. Pt will continue her current medication and f/u as planned.

## 2022-05-17 NOTE — Progress Notes (Signed)
HPI: Follow-up atrial fibrillation and chronic diastolic congestive heart failure.  Patient previously followed by Dr. Tamala Julian but transitioning to me.  Patient had ablation of AVNRT August 2016.  Carotid Dopplers August 2016 showed 1 to 39% bilateral stenosis.  Chest CT November 2022 showed coronary calcification.  ABIs June 2023 normal bilaterally.  Monitor August 2023 showed atrial fibrillation/flutter 8% burden and she was placed on flecainide and apixaban.  Nuclear study January 2024 showed no ischemia or infarction and gated ejection fraction 36% but visually appears better.  Monitor January 2024 showed sinus rhythm with occasional PAC, PVC, brief PAT and 3 beats nonsustained ventricular tachycardia.  Echocardiogram February 2024 showed normal LV function, grade 1 diastolic dysfunction, mild left atrial enlargement, mild mitral regurgitation, mild aortic insufficiency.  Flecainide was ultimately discontinued due to coronary calcification.  Since last seen she has dyspnea on exertion which is chronic.  No orthopnea, PND, pedal edema, exertional chest pain or syncope.  Occasional palpitations.  Current Outpatient Medications  Medication Sig Dispense Refill   acetaminophen (TYLENOL) 500 MG tablet Take 2 tablets (1,000 mg total) by mouth every 6 (six) hours as needed for mild pain or fever.     albuterol (VENTOLIN HFA) 108 (90 Base) MCG/ACT inhaler SMARTSIG:1 Puff(s) By Mouth Every 4-6 Hours PRN     apixaban (ELIQUIS) 5 MG TABS tablet Take 1 tablet (5 mg total) by mouth 2 (two) times daily. 60 tablet 11   atorvastatin (LIPITOR) 40 MG tablet Take 40 mg by mouth daily.     beta carotene w/minerals (OCUVITE) tablet Take 1 tablet by mouth daily with breakfast.     BIOTIN PO Take 1 tablet by mouth daily.     Budeson-Glycopyrrol-Formoterol (BREZTRI AEROSPHERE) 160-9-4.8 MCG/ACT AERO Inhale 2 puffs into the lungs 2 (two) times daily. 10.7 g 5   cholecalciferol (VITAMIN D) 1000 UNITS tablet Take 1,000  Units by mouth daily.     escitalopram (LEXAPRO) 10 MG tablet Take 10 mg by mouth daily.  0   metoprolol succinate (TOPROL XL) 25 MG 24 hr tablet Take 0.5 tablets (12.5 mg total) by mouth daily. 45 tablet 3   Omega-3 Fatty Acids (FISH OIL) 1000 MG CAPS Take 1 capsule by mouth daily.     polyethylene glycol (MIRALAX / GLYCOLAX) packet Take 17 g by mouth daily.     traMADol (ULTRAM) 50 MG tablet Take 50 mg by mouth as needed for moderate pain.     traZODone (DESYREL) 50 MG tablet Take 75 mg by mouth at bedtime as needed for sleep.     vitamin B-12 (CYANOCOBALAMIN) 100 MCG tablet Take 100 mcg by mouth daily.     No current facility-administered medications for this visit.     Past Medical History:  Diagnosis Date   Arthritis    Bilateral foot-drop 08/31/2020   Depression    PT'S HUSBAND AND SON HAVE DIED W/IN PAST YEAR June 21, 2011   GERD (gastroesophageal reflux disease)    Hearing loss    Hyperlipidemia    Idiopathic peripheral neuropathy 07/18/2016   Nocturnal leg cramps 01/22/2017   Stroke [facility])    SVT (supraventricular tachycardia) 09/2014   Thyroid disease     Past Surgical History:  Procedure Laterality Date   APPENDECTOMY     BACK SURGERY     lumbar   BLADDER SURGERY     BUNIONECTOMY     left and right with hammer toe repair   CARPAL TUNNEL RELEASE Bilateral 2015/06/21   ELECTROPHYSIOLOGIC  STUDY N/A 11/03/2014   Procedure: SVT Ablation;  Surgeon: Evans Lance, MD;  Location: [facility];  Service: Cardiovascular;  Laterality: N/A;   EYE SURGERY     bilateral cataract extraction with IOL   FOOT SURGERY     KNEE ARTHROSCOPY  02/09/2011   Procedure: ARTHROSCOPY KNEE;  Surgeon: Tobi Bastos;  Location: [facility];  Service: Orthopedics;  Laterality: Left;  Left Knee Arthroscopy with Menisectomy medial, abrasion chondroplasty medial, and synovectomy, supra patella pouch.   NASAL SINUS SURGERY     OOPHORECTOMY     ROTATOR CUFF REPAIR  1/13,  3/13   x 3  right/   SALPINGECTOMY      SHOULDER OPEN ROTATOR CUFF REPAIR Left 06/26/2012   Procedure: LEFT SHOULDER ROTATOR CUFF REPAIR WITH ANCHORS ;  Surgeon: Tobi Bastos, MD;  Location: [facility];  Service: Orthopedics;  Laterality: Left;   TOTAL KNEE ARTHROPLASTY  09/05/2011   Procedure: TOTAL KNEE ARTHROPLASTY;  Surgeon: Tobi Bastos, MD;  Location: [facility];  Service: Orthopedics;  Laterality: Left;    Social History   Socioeconomic History   Marital status: Widowed    Spouse name: Not on file   Number of children: 2   Years of education: Not on file   Highest education level: Not on file  Occupational History   Occupation: Retired  Tobacco Use   Smoking status: Former    Packs/day: 0.50    Years: 5.00    Additional pack years: 0.00    Total pack years: 2.50    Types: Cigarettes    Quit date: 02/05/1959    Years since quitting: 63.3   Smokeless tobacco: Never  Substance and Sexual Activity   Alcohol use: Yes    Alcohol/week: 1.0 standard drink of alcohol    Types: 1 Glasses of wine per week    Comment: socially   Drug use: No   Sexual activity: Not on file  Other Topics Concern   Not on file  Social History Narrative   Lives alone   Caffeine use: 4 drinks coffee per day   Right-handed   Social Determinants of Health   Financial Resource Strain: Not on file  Food Insecurity: Not on file  Transportation Needs: Not on file  Physical Activity: Not on file  Stress: Not on file  Social Connections: Not on file  Intimate Partner Violence: Not on file    Family History  Problem Relation Age of Onset   Diabetes Mother    Diabetes Son     ROS: no fevers or chills, productive cough, hemoptysis, dysphasia, odynophagia, melena, hematochezia, dysuria, hematuria, rash, seizure activity, orthopnea, PND, pedal edema, claudication. Remaining systems are negative.  Physical Exam: Well-developed well-nourished in no acute distress.  Skin is warm and dry.  HEENT is normal.  Neck is supple.  Chest is  clear to auscultation with normal expansion.  Cardiovascular exam is regular rate and rhythm.  Abdominal exam nontender or distended. No masses palpated. Extremities show no edema. neuro grossly intact   A/P  1 paroxysmal atrial fibrillation-patient remains in sinus rhythm on examination.  Will continue Toprol for rate control if atrial fibrillation recurs.  Continue apixaban.  If she has more frequent episodes in the future we will consider addition of antiarrhythmic such as amiodarone.  2 history of SVT ablation  3 coronary calcification-patient denies chest pain.  Recent nuclear study showed no ischemia.  Plan medical therapy.  Continue statin.  4 history of  chronic dyspnea-etiology unclear.  Nuclear study showed no ischemia and echocardiogram showed preserved LV function.  Question contribution from scoliosis/restrictive lung disease.  5 chronic diastolic congestive heart failure-patient is euvolemic on examination.  Will continue to follow.  6 hyperlipidemia-continue Lipitor.  Note approximately 20 minutes spent reviewing patient's records prior to her arrival.  This was my first time evaluating the patient.  Kirk Ruths, MD

## 2022-05-21 ENCOUNTER — Other Ambulatory Visit: Payer: Self-pay | Admitting: *Deleted

## 2022-05-21 MED ORDER — METOPROLOL SUCCINATE ER 25 MG PO TB24: 12.5000 mg | ORAL_TABLET | Freq: Every day | ORAL | 3 refills | Status: DC

## 2022-05-24 ENCOUNTER — Ambulatory Visit: Payer: Medicare Other | Attending: Cardiology | Admitting: Cardiology

## 2022-05-24 ENCOUNTER — Encounter: Payer: Self-pay | Admitting: Cardiology

## 2022-05-24 VITALS — BP 138/74 | HR 73 | Ht 65.0 in | Wt 137.0 lb

## 2022-05-24 DIAGNOSIS — R0609 Other forms of dyspnea: Secondary | ICD-10-CM

## 2022-05-24 DIAGNOSIS — I5032 Chronic diastolic (congestive) heart failure: Secondary | ICD-10-CM

## 2022-05-24 DIAGNOSIS — I480 Paroxysmal atrial fibrillation: Secondary | ICD-10-CM | POA: Diagnosis not present

## 2022-05-24 DIAGNOSIS — I471 Supraventricular tachycardia, unspecified: Secondary | ICD-10-CM | POA: Diagnosis not present

## 2022-05-24 DIAGNOSIS — I2510 Atherosclerotic heart disease of native coronary artery without angina pectoris: Secondary | ICD-10-CM

## 2022-05-24 NOTE — Patient Instructions (Signed)
    Follow-Up: At [facility], you and your health needs are our priority.  As part of our continuing mission to provide you with exceptional heart care, we have created designated Provider Care Teams.  These Care Teams include your primary Cardiologist (physician) and Advanced Practice Providers (APPs -  Physician Assistants and Nurse Practitioners) who all work together to provide you with the care you need, when you need it.  We recommend signing up for the patient portal called "MyChart".  Sign up information is provided on this After Visit Summary.  MyChart is used to connect with patients for Virtual Visits (Telemedicine).  Patients are able to view lab/test results, encounter notes, upcoming appointments, etc.  Non-urgent messages can be sent to your provider as well.   To learn more about what you can do with MyChart, go to [email or web address].    Your next appointment:   6 month(s)  Provider:   Brian Crenshaw, MD      

## 2022-05-28 DIAGNOSIS — I4819 Other persistent atrial fibrillation: Secondary | ICD-10-CM | POA: Diagnosis not present

## 2022-05-28 DIAGNOSIS — R7303 Prediabetes: Secondary | ICD-10-CM | POA: Diagnosis not present

## 2022-05-28 DIAGNOSIS — D6869 Other thrombophilia: Secondary | ICD-10-CM | POA: Diagnosis not present

## 2022-05-28 DIAGNOSIS — I700 Atherosclerosis of aorta: Secondary | ICD-10-CM | POA: Diagnosis not present

## 2022-05-28 DIAGNOSIS — E782 Mixed hyperlipidemia: Secondary | ICD-10-CM | POA: Diagnosis not present

## 2022-05-28 DIAGNOSIS — I5032 Chronic diastolic (congestive) heart failure: Secondary | ICD-10-CM | POA: Diagnosis not present

## 2022-06-07 ENCOUNTER — Ambulatory Visit (HOSPITAL_BASED_OUTPATIENT_CLINIC_OR_DEPARTMENT_OTHER): Payer: Medicare Other | Admitting: Pulmonary Disease

## 2022-06-07 ENCOUNTER — Other Ambulatory Visit (HOSPITAL_COMMUNITY): Payer: Self-pay

## 2022-06-07 ENCOUNTER — Encounter (HOSPITAL_BASED_OUTPATIENT_CLINIC_OR_DEPARTMENT_OTHER): Payer: Self-pay | Admitting: Pulmonary Disease

## 2022-06-07 VITALS — BP 118/64 | HR 55 | Ht 65.0 in | Wt 137.0 lb

## 2022-06-07 DIAGNOSIS — H31091 Other chorioretinal scars, right eye: Secondary | ICD-10-CM | POA: Diagnosis not present

## 2022-06-07 DIAGNOSIS — J4550 Severe persistent asthma, uncomplicated: Secondary | ICD-10-CM | POA: Diagnosis not present

## 2022-06-07 DIAGNOSIS — H353132 Nonexudative age-related macular degeneration, bilateral, intermediate dry stage: Secondary | ICD-10-CM | POA: Diagnosis not present

## 2022-06-07 DIAGNOSIS — H43813 Vitreous degeneration, bilateral: Secondary | ICD-10-CM | POA: Diagnosis not present

## 2022-06-07 NOTE — Progress Notes (Signed)
Subjective:   PATIENT ID: Ann Rodriguez GENDER: female DOB: 11/22/1928, MRN: [id number]   HPI  Chief Complaint  Patient presents with   Follow-up    Pt is here for follow up for asthma. Pt states that she is unsure about Ann Rodriguez she states it is making her cough and bringing up mucus. Pt states she is having to clear her throat a lot. Pt states that she is not having to use her labuterol at all. ACT is 17     Reason for Visit: Follow-up   [name] is a 89 year old female with kyphoscoliosis, atrial fibrillation, SVT status post ablation [id number], chronic diastolic heart failure, hx TIA, HLD, prior sinus surgery who presents for follow-up  Synopsis: Initially presented for longstanding shortness of breath for over a year. Denies associated chest pain, cough, wheezing or sputum production. Reports her symptoms have limited her ability to clean the house like she used to. PCP and Cardiology work-up has revealed chronic diastolic heart failure otherwise negative for etiology.   04/2020 Since her last visit, her symptoms of dyspnea have remained unchanged.  Occurs with exertion.  Has limited her activities.  She was previously very active participating in water aerobics.  During the pandemic she has not been able to engage as often as she would like.  She did have a recent fall while doing yoga and has modified her movements by using a chair.  Does report benefit after starting this activity.  Denies any associated wheezing.  Does have a dry cough.  Symptoms do not seem different from day or night.  02/19/22 Last visit was >1 year ago. Since our last visit she reports shortness of breath including with simple activities including while combing her hair. Denies any at rest but with any exertion including. Denies cough and wheezing. Has been on Breo for one month but not improved and albuterol. Uses albuterol 3-4 times a day. Taking mucinex for congestion.  04/12/22 Since our last visit Trelegy  was tried but she continued to have shortness of breath due to inability to take full dose of inhaler. Continues to have shortness of breath with walking and using her arms for tasks like combing her hair. She recently had a stress test this week that was low risk but was concerning for new low EF. Plans for repeat echocardiogram. She has been doing chair exercises including upper body exercises.  06/07/22 She was changed from Trelegy to [employer]. She is able to tolerate medication but has unchnaged shortness of breath but feels she is having more sputum production compared to before. Occasional wheezing. Started chair yoga 2-3 times a week. She will get winded on exertion.  Social History: Significant exposure from her spouse. He passed with COPD Social smoker. Quit in 1960. Possibly 3 pack-years at most   Past Medical History:  Diagnosis Date   Arthritis    Bilateral foot-drop 08/31/2020   Depression    PT'S HUSBAND AND SON HAVE DIED W/IN PAST YEAR 2013   GERD (gastroesophageal reflux disease)    Hearing loss    Hyperlipidemia    Idiopathic peripheral neuropathy 07/18/2016   Nocturnal leg cramps 01/22/2017   Stroke ([place])    SVT (supraventricular tachycardia) 09/2014   Thyroid disease    No Known Allergies   Outpatient Medications Prior to Visit  Medication Sig Dispense Refill   acetaminophen (TYLENOL) 500 MG tablet Take 2 tablets (1,000 mg total) by mouth every 6 (six) hours as needed for mild  pain or fever.     albuterol (VENTOLIN HFA) 108 (90 Base) MCG/ACT inhaler SMARTSIG:1 Puff(s) By Mouth Every 4-6 Hours PRN     apixaban (ELIQUIS) 5 MG TABS tablet Take 1 tablet (5 mg total) by mouth 2 (two) times daily. 60 tablet 11   atorvastatin (LIPITOR) 40 MG tablet Take 40 mg by mouth daily.     BIOTIN PO Take 1 tablet by mouth daily.     Budeson-Glycopyrrol-Formoterol (BREZTRI AEROSPHERE) 160-9-4.8 MCG/ACT AERO Inhale 2 puffs into the lungs 2 (two) times daily. 10.7 g 5   cholecalciferol  (VITAMIN D) 1000 UNITS tablet Take 1,000 Units by mouth daily.     escitalopram (LEXAPRO) 10 MG tablet Take 10 mg by mouth daily.  0   metoprolol succinate (TOPROL XL) 25 MG 24 hr tablet Take 0.5 tablets (12.5 mg total) by mouth daily. (Patient taking differently: Take 12.5 mg by mouth daily. Dr Zenaida Deed put back on 25mg ) 45 tablet 3   Multiple Vitamins-Minerals (PRESERVISION AREDS 2 PO) Take by mouth.     polyethylene glycol (MIRALAX / GLYCOLAX) packet Take 17 g by mouth daily.     traMADol (ULTRAM) 50 MG tablet Take 50 mg by mouth as needed for moderate pain.     traZODone (DESYREL) 50 MG tablet Take 75 mg by mouth at bedtime as needed for sleep.     vitamin B-12 (CYANOCOBALAMIN) 100 MCG tablet Take 100 mcg by mouth daily.     beta carotene w/minerals (OCUVITE) tablet Take 1 tablet by mouth daily with breakfast.     Omega-3 Fatty Acids (FISH OIL) 1000 MG CAPS Take 1 capsule by mouth daily.     No facility-administered medications prior to visit.    Review of Systems  Constitutional:  Negative for chills, diaphoresis, fever, malaise/fatigue and weight loss.  HENT:  Negative for congestion.   Respiratory:  Positive for sputum production and shortness of breath. Negative for cough, hemoptysis and wheezing.   Cardiovascular:  Negative for chest pain, palpitations and leg swelling.     Objective:   Vitals:   06/07/22 0915  BP: 118/64  Pulse: (!) 55  SpO2: 97%  Weight: 137 lb (62.1 kg)  Height: 5\' 5"  (1.651 m)   SpO2: 97 % O2 Device: None (Room air)  Physical Exam: General: Well-appearing, no acute distress HENT: [place], AT Eyes: EOMI, no scleral icterus Respiratory: Clear to auscultation bilaterally.  No crackles, wheezing or rales Cardiovascular: RRR, -M/R/G, no JVD Extremities:-Edema,-tenderness Neuro: AAO x4, CNII-XII grossly intact Psych: Normal mood, normal affect  Data Reviewed:  Imaging: CTA 02/18/13 - No PE. Background emphysema CXR Ribs 07/25/19 Remote left rib fractures  5-7. Subacute left 10th rib fracture.  CT Chest 02/06/21 - mild scarring in RUL and bilateral lower lobes CXR 10/18/21 - Cardiomegaly. No acute infiltrate, effusion or edema CXR 03/14/22 - Cardiomegaly. Kyphoscoliosis. No acute infiltrate, effusion or edema.  PFT: 10/28/14 FVC 2.81 (109%) FEV1 2.2 (116%) Ratio 70  TLC 95% DLCO 75% Interpretation: Mild obstructive defect with mildly reduced DLCO  04/21/20 FVC 2.1 (90%) FEV1 1.69 (98%) Ratio 74  TLC 84% DLCO 78% Interpretation: No obstruction on spirometry however bronchodilator response significant in FEV1. Normal lung volumes. Reduced gas exchange.  Labs: CBC    Component Value Date/Time   WBC 6.3 03/14/2022 0840   RBC 3.85 (L) 03/14/2022 0840   HGB 12.1 03/14/2022 0840   HGB 13.8 10/02/2021 1553   HCT 36.9 03/14/2022 0840   HCT 42.9 10/02/2021 1553   PLT  247 03/14/2022 0840   PLT 247 10/02/2021 1553   MCV 95.8 03/14/2022 0840   MCV 96 10/02/2021 1553   MCH 31.4 03/14/2022 0840   MCHC 32.8 03/14/2022 0840   RDW 13.5 03/14/2022 0840   RDW 13.1 10/02/2021 1553   LYMPHSABS 0.9 03/14/2022 0840   MONOABS 0.7 03/14/2022 0840   EOSABS 0.1 03/14/2022 0840   BASOSABS 0.0 03/14/2022 0840      Assessment & Plan:   Discussion: 89 year old female with remote smoking history who presents for follow-up for asthma.  Prior PFTs demonstrate obstructive defect on CTA in 2014 commenting on emphysema.  She also has mild kyphoscoliosis and history of rib fractures that can contribute to dyspnea. On triple therapy with persistent symptoms. Deconditioning is likely contributing to her dyspnea as well.  Mild Persistent Asthma --CONTINUE Breztri TWO puffs in the morning and the evening. Rinse mouth out after use to prevent thrush --CONTINUE Albuterol TWO puffs ONCE a day. This is your as needed --START mucinex 600 mg  --CONTINUE regular exercise. Encourage 10,000 steps a day. Start upper body exercises with weights.  --REFER pulmonary  rehab  Kyphoscoliosis Remote rib fractures Deconditioning --Supportive care --Encourage regular activity as tolerated.  OK to do yoga with chair  Health Maintenance Immunization History  Administered Date(s) Administered   Influenza Split 11/20/2007   Influenza, High Dose Seasonal PF 11/24/2009, 11/06/2010, 12/10/2011, 11/17/2012, 12/10/2013, 12/03/2014, 12/12/2015, 11/01/2016, 12/12/2017, 11/20/2018, 11/12/2019   PFIZER(Purple Top)SARS-COV-2 Vaccination 04/02/2019, 04/23/2019, 12/16/2019   Pneumococcal Conjugate-13 05/10/2014   Pneumococcal Polysaccharide-23 03/17/2009   CT Lung Screen - not indicated  Orders Placed This Encounter  Procedures   AMB referral to pulmonary rehabilitation    Referral Priority:   Routine    Referral Type:   Consultation    Number of Visits Requested:   1   No orders of the defined types were placed in this encounter.  Return in about 3 months (around 09/07/2022) for with Dr. Loanne Drilling.  I have spent a total time of 35-minutes on the day of the appointment including chart review, data review, collecting history, coordinating care and discussing medical diagnosis and plan with the patient/family. Past medical history, allergies, medications were reviewed. Pertinent imaging, labs and tests included in this note have been reviewed and interpreted independently by me.  Pine, MD [facility] 06/07/2022 9:40 AM  Office Number [phone]

## 2022-06-18 DIAGNOSIS — M25511 Pain in right shoulder: Secondary | ICD-10-CM | POA: Diagnosis not present

## 2022-06-19 DIAGNOSIS — K080 Exfoliation of teeth due to systemic causes: Secondary | ICD-10-CM | POA: Diagnosis not present

## 2022-06-22 ENCOUNTER — Encounter (HOSPITAL_COMMUNITY): Payer: Self-pay

## 2022-07-02 ENCOUNTER — Telehealth: Payer: Self-pay | Admitting: Cardiology

## 2022-07-02 MED ORDER — METOPROLOL SUCCINATE ER 25 MG PO TB24: 25.0000 mg | ORAL_TABLET | Freq: Every day | ORAL | 3 refills | Status: DC

## 2022-07-02 NOTE — Telephone Encounter (Signed)
Pt c/o medication issue:  1. Name of Medication:   metoprolol succinate (TOPROL XL) 25 MG 24 hr tablet    2. How are you currently taking this medication (dosage and times per day)?   Take 0.5 tablets (12.5 mg total) by mouth daily.Patient taking differently: Take 12.5 mg by mouth daily. Dr Nichola Sizer put back on     3. Are you having a reaction (difficulty breathing--STAT)? No  4. What is your medication issue? Pt would like a callback to find out if she's supposed to take a half tablet or full tablet. She stated she was told one thing but her medication instructions say another thing. Please advise.

## 2022-07-02 NOTE — Telephone Encounter (Signed)
Patient would like clarification on metoprolol succinate  daily.  Instructions on med list and medication bottle say 0.5 tablets (12.5)  po daily. Patient states on her visit with Dr. Jens Som she thought she was to start taking a whole tablet for  daily. Looking at AVS it just say continue metoprolol for rate control and on AVS it also say 0.5 tablets (12.5mg ) but then the directions say she is taking differently "Dr. Nichola Sizer put back on "  not sure if rect is short for recently or if that is part of the name. Please adivse.

## 2022-07-02 NOTE — Telephone Encounter (Signed)
Spoke with pt, Aware of dr crenshaw's recommendations. New script sent to the pharmacy  

## 2022-07-16 ENCOUNTER — Telehealth (HOSPITAL_COMMUNITY): Payer: Self-pay

## 2022-07-16 NOTE — Telephone Encounter (Signed)
No response from pt.  Closed referral  

## 2022-07-17 ENCOUNTER — Emergency Department (HOSPITAL_COMMUNITY): Payer: Medicare Other

## 2022-07-17 ENCOUNTER — Emergency Department (HOSPITAL_COMMUNITY)
Admission: EM | Admit: 2022-07-17 | Discharge: 2022-07-17 | Disposition: A | Discharge status: Home / Self Care | Payer: Medicare Other | Attending: Student in an Organized Health Care Education/Training Program | Admitting: Student in an Organized Health Care Education/Training Program

## 2022-07-17 ENCOUNTER — Other Ambulatory Visit: Payer: Self-pay

## 2022-07-17 DIAGNOSIS — S299XXA Unspecified injury of thorax, initial encounter: Secondary | ICD-10-CM | POA: Diagnosis not present

## 2022-07-17 DIAGNOSIS — Y9343 Activity, gymnastics: Secondary | ICD-10-CM | POA: Diagnosis not present

## 2022-07-17 DIAGNOSIS — S3993XA Unspecified injury of pelvis, initial encounter: Secondary | ICD-10-CM | POA: Diagnosis not present

## 2022-07-17 DIAGNOSIS — Y900 Blood alcohol level of less than 20 mg/100 ml: Secondary | ICD-10-CM | POA: Insufficient documentation

## 2022-07-17 DIAGNOSIS — R0789 Other chest pain: Secondary | ICD-10-CM | POA: Insufficient documentation

## 2022-07-17 DIAGNOSIS — Z96652 Presence of left artificial knee joint: Secondary | ICD-10-CM | POA: Diagnosis not present

## 2022-07-17 DIAGNOSIS — Z7901 Long term (current) use of anticoagulants: Secondary | ICD-10-CM | POA: Diagnosis not present

## 2022-07-17 DIAGNOSIS — W01198A Fall on same level from slipping, tripping and stumbling with subsequent striking against other object, initial encounter: Secondary | ICD-10-CM | POA: Insufficient documentation

## 2022-07-17 DIAGNOSIS — Y9239 Other specified sports and athletic area as the place of occurrence of the external cause: Secondary | ICD-10-CM | POA: Diagnosis not present

## 2022-07-17 DIAGNOSIS — M25512 Pain in left shoulder: Secondary | ICD-10-CM | POA: Insufficient documentation

## 2022-07-17 DIAGNOSIS — S0993XA Unspecified injury of face, initial encounter: Secondary | ICD-10-CM | POA: Diagnosis not present

## 2022-07-17 DIAGNOSIS — S0990XA Unspecified injury of head, initial encounter: Secondary | ICD-10-CM

## 2022-07-17 DIAGNOSIS — Z043 Encounter for examination and observation following other accident: Secondary | ICD-10-CM | POA: Diagnosis not present

## 2022-07-17 DIAGNOSIS — W19XXXA Unspecified fall, initial encounter: Secondary | ICD-10-CM

## 2022-07-17 DIAGNOSIS — S0081XA Abrasion of other part of head, initial encounter: Secondary | ICD-10-CM | POA: Diagnosis not present

## 2022-07-17 DIAGNOSIS — R519 Headache, unspecified: Secondary | ICD-10-CM | POA: Diagnosis not present

## 2022-07-17 DIAGNOSIS — R102 Pelvic and perineal pain: Secondary | ICD-10-CM | POA: Diagnosis not present

## 2022-07-17 DIAGNOSIS — S0012XA Contusion of left eyelid and periocular area, initial encounter: Secondary | ICD-10-CM | POA: Insufficient documentation

## 2022-07-17 DIAGNOSIS — Z87891 Personal history of nicotine dependence: Secondary | ICD-10-CM | POA: Diagnosis not present

## 2022-07-17 LAB — COMPREHENSIVE METABOLIC PANEL
ALT: 19 U/L (ref 0–44)
AST: 25 U/L (ref 15–41)
Albumin: 3.7 g/dL (ref 3.5–5.0)
Alkaline Phosphatase: 67 U/L (ref 38–126)
Anion gap: 10 (ref 5–15)
BUN: 18 mg/dL (ref 8–23)
CO2: 24 mmol/L (ref 22–32)
Calcium: 9.4 mg/dL (ref 8.9–10.3)
Chloride: 102 mmol/L (ref 98–111)
Creatinine, Ser: 0.69 mg/dL (ref 0.44–1.00)
GFR, Estimated: >60 mL/min (ref >60)
Glucose, Bld: 112 mg/dL — ABNORMAL HIGH (ref 70–99)
Potassium: 4.3 mmol/L (ref 3.5–5.1)
Sodium: 136 mmol/L (ref 135–145)
Total Bilirubin: 1 mg/dL (ref 0.3–1.2)
Total Protein: 6.6 g/dL (ref 6.5–8.1)

## 2022-07-17 LAB — CBC
HCT: 40.8 % (ref 36.0–46.0)
Hemoglobin: 13.5 g/dL (ref 12.0–15.0)
MCH: 30.6 pg (ref 26.0–34.0)
MCHC: 33.1 g/dL (ref 30.0–36.0)
MCV: 92.5 fL (ref 80.0–100.0)
Platelets: 257 10*3/uL (ref 150–400)
RBC: 4.41 MIL/uL (ref 3.87–5.11)
RDW: 14.3 % (ref 11.5–15.5)
WBC: 8.3 10*3/uL (ref 4.0–10.5)
nRBC: 0 % (ref 0.0–0.2)

## 2022-07-17 LAB — I-STAT CHEM 8, ED
BUN: 20 mg/dL (ref 8–23)
Calcium, Ion: 1.2 mmol/L (ref 1.15–1.40)
Chloride: 105 mmol/L (ref 98–111)
Creatinine, Ser: 0.6 mg/dL (ref 0.44–1.00)
Glucose, Bld: 110 mg/dL — ABNORMAL HIGH (ref 70–99)
HCT: 40 % (ref 36.0–46.0)
Hemoglobin: 13.6 g/dL (ref 12.0–15.0)
Potassium: 4.4 mmol/L (ref 3.5–5.1)
Sodium: 139 mmol/L (ref 135–145)
TCO2: 25 mmol/L (ref 22–32)

## 2022-07-17 LAB — PROTIME-INR
INR: 1.2 (ref 0.8–1.2)
Prothrombin Time: 15.4 seconds — ABNORMAL HIGH (ref 11.4–15.2)

## 2022-07-17 LAB — ETHANOL: Alcohol, Ethyl (B): <10 mg/dL (ref <10)

## 2022-07-17 LAB — SAMPLE TO BLOOD BANK

## 2022-07-17 LAB — LACTIC ACID, PLASMA: Lactic Acid, Venous: 1.3 mmol/L (ref 0.5–1.9)

## 2022-07-17 MED ORDER — ACETAMINOPHEN 500 MG PO TABS
1000.0000 mg | ORAL_TABLET | Freq: Four times a day (QID) | ORAL | Status: DC
  Administered 2022-07-17: 1000 mg via ORAL
  Filled 2022-07-17: qty 2

## 2022-07-17 MED ORDER — METHOCARBAMOL 500 MG PO TABS
500.0000 mg | ORAL_TABLET | Freq: Three times a day (TID) | ORAL | Status: DC
  Administered 2022-07-17: 500 mg via ORAL
  Filled 2022-07-17: qty 1

## 2022-07-17 MED ORDER — OXYCODONE HCL 5 MG PO TABS: 2.5000 mg | ORAL_TABLET | ORAL | Status: DC | PRN

## 2022-07-17 MED ORDER — LIDOCAINE 5 % EX PTCH: 1.0000 | MEDICATED_PATCH | CUTANEOUS | 0 refills | Status: DC

## 2022-07-17 MED ORDER — LIDOCAINE 5 % EX PTCH
1.0000 | MEDICATED_PATCH | CUTANEOUS | Status: DC
  Administered 2022-07-17: 1 via TRANSDERMAL
  Filled 2022-07-17: qty 1

## 2022-07-17 MED ORDER — FENTANYL CITRATE PF 50 MCG/ML IJ SOSY
25.0000 ug | PREFILLED_SYRINGE | Freq: Once | INTRAMUSCULAR | Status: AC
  Administered 2022-07-17: 25 ug via INTRAVENOUS
  Filled 2022-07-17: qty 1

## 2022-07-17 NOTE — Discharge Instructions (Signed)
Take over-the-counter medications as needed for aches and pains.  Apply ice to help with any swelling.  You may feel stiff and sore over the next week or so.  Return to the ED as needed for worsening symptoms

## 2022-07-17 NOTE — ED Triage Notes (Addendum)
Pt states she was walking across the gym floor and she tripped and fell forward and hit her face off of the tile floor. Pt has approx 0.5 in lac on side of left eye. Left eye is ecchymotic. Pt has small laceration on left upper lip. Bleeding is controlled on both. Pt is on eliquis. Pt is A&Ox4.   Pt denies LOC. Pt is A&Ox4. Pt c/o face pain, left side chest pain and left shoulder pain. Pt denies neck or back pain.

## 2022-07-17 NOTE — ED Provider Notes (Signed)
[place] EMERGENCY DEPARTMENT AT [facility] Provider Note   CSN: [id number] Arrival date & time: 07/17/22  1202     History {Add pertinent medical, surgical, social history, OB history to HPI:1} Chief Complaint  Patient presents with   level 2 FOT    Ann Rodriguez is a 89 y.o. female.  89 year old female brought in to the emergency department due to a fall while on blood thinners.  She was activated as a level 2 trauma alert since she is on anticoagulation with a laceration to her face.  Patient has a dropfoot and this caused a mechanical fall earlier today.  Family was they are when the fall happened.  They deny any loss of consciousness.  She is complaining of some left-sided discomfort but denies any shortness of breath, neck pain, back pain, or headache.  She has a small abrasion to her face, otherwise no reported injuries.        Home Medications Prior to Admission medications   Medication Sig Start Date End Date Taking? Authorizing Provider  acetaminophen (TYLENOL) 500 MG tablet Take 2 tablets (1,000 mg total) by mouth every 6 (six) hours as needed for mild pain or fever. 02/08/21   Juliet Rude, PA-C  albuterol (VENTOLIN HFA) 108 (90 Base) MCG/ACT inhaler SMARTSIG:1 Puff(s) By Mouth Every 4-6 Hours PRN 01/30/22   [provider]  apixaban (ELIQUIS) 5 MG TABS tablet Take 1 tablet (5 mg total) by mouth 2 (two) times daily. 10/25/21   Lyn Records, MD  atorvastatin (LIPITOR) 40 MG tablet Take 40 mg by mouth daily.    [provider]  BIOTIN PO Take 1 tablet by mouth daily.    [provider]  Budeson-Glycopyrrol-Formoterol (BREZTRI AEROSPHERE) 160-9-4.8 MCG/ACT AERO Inhale 2 puffs into the lungs 2 (two) times daily. 04/12/22   Luciano Cutter, MD  cholecalciferol (VITAMIN D) 1000 UNITS tablet Take 1,000 Units by mouth daily.    [provider]  escitalopram (LEXAPRO) 10 MG tablet Take 10 mg by mouth daily. 04/01/17   [provider]  metoprolol succinate (TOPROL XL) 25 MG 24 hr tablet Take 1 tablet (25 mg total) by mouth daily. 07/02/22   Lewayne Bunting, MD  Multiple Vitamins-Minerals (PRESERVISION AREDS 2 PO) Take by mouth.    [provider]  polyethylene glycol (MIRALAX / GLYCOLAX) packet Take 17 g by mouth daily.    [provider]  traMADol (ULTRAM) 50 MG tablet Take 50 mg by mouth as needed for moderate pain. 07/21/21   [provider]  traZODone (DESYREL) 50 MG tablet Take 75 mg by mouth at bedtime as needed for sleep. 01/08/21   [provider]  vitamin B-12 (CYANOCOBALAMIN) 100 MCG tablet Take 100 mcg by mouth daily.    [provider]      Allergies    Patient has no known allergies.    Review of Systems   Review of Systems  Skin:  Positive for wound.  Neurological:        Fall  All other systems reviewed and are negative.   Physical Exam Updated Vital Signs BP (!) 143/73   Pulse (!) 58   Temp (!) 97.5 F (36.4 C) (Oral)   Resp (!) 21   Ht 5\' 5"  (1.651 m)   Wt 62.1 kg   SpO2 95%   BMI 22.78 kg/m  Physical Exam Vitals and nursing note reviewed.  Constitutional:      General: She is  not in acute distress.    Appearance: She is well-developed.  HENT:     Head: Normocephalic and atraumatic.   Eyes:     Conjunctiva/sclera: Conjunctivae normal.  Cardiovascular:     Rate and Rhythm: Normal rate and regular rhythm.     Heart sounds: No murmur heard. Pulmonary:     Effort: Pulmonary effort is normal. No respiratory distress.     Breath sounds: Normal breath sounds.  Abdominal:     Palpations: Abdomen is soft.     Tenderness: There is no abdominal tenderness.  Musculoskeletal:        General: No swelling.     Cervical back: Neck supple.     Comments: Limited left shoulder range of motion secondary to pain without any obvious deformity  Skin:    General: Skin is warm and dry.     Capillary Refill: Capillary refill takes less than  2 seconds.  Neurological:     General: No focal deficit present.     Mental Status: She is alert and oriented to person, place, and time.  Psychiatric:        Mood and Affect: Mood normal.     ED Results / Procedures / Treatments   Labs (all labs ordered are listed, but only abnormal results are displayed) Labs Reviewed  COMPREHENSIVE METABOLIC PANEL - Abnormal; Notable for the following components:      Result Value   Glucose, Bld 112 (*)    All other components within normal limits  PROTIME-INR - Abnormal; Notable for the following components:   Prothrombin Time 15.4 (*)    All other components within normal limits  I-STAT CHEM 8, ED - Abnormal; Notable for the following components:   Glucose, Bld 110 (*)    All other components within normal limits  CBC  ETHANOL  LACTIC ACID, PLASMA  URINALYSIS, ROUTINE W REFLEX MICROSCOPIC  SAMPLE TO BLOOD BANK    EKG None  Radiology DG Shoulder Left  Result Date: 07/17/2022 CLINICAL DATA:  Provided history: Left shoulder pain status post fall. EXAM: LEFT SHOULDER - 2+ VIEW COMPARISON:  Prior chest radiographs 03/14/2022 and earlier. FINDINGS: Due to patient discomfort, an axillary view radiograph could not be acquired. Within this limitation, findings are as follows. No evidence of acute fracture or dislocation. Redemonstrated chronic widening of the left acromioclavicular joint consist with prior left acromioclavicular joint injury. IMPRESSION: 1. Due to patient discomfort, an axillary view radiograph could not be acquired. 2. Within this limitation, there is no evidence of an acute fracture or dislocation. 3. Redemonstrated chronic widening of the left acromioclavicular joint consistent with prior left acromioclavicular joint injury. Electronically Signed   By: Jackey Loge D.O.   On: 07/17/2022 13:25   CT HEAD WO CONTRAST  Result Date: 07/17/2022 CLINICAL DATA:  Trauma to the face and head. Anticoagulated patient. EXAM: CT HEAD WITHOUT  CONTRAST CT MAXILLOFACIAL WITHOUT CONTRAST TECHNIQUE: Multidetector CT imaging of the head and maxillofacial structures were performed using the standard protocol without intravenous contrast. Multiplanar CT image reconstructions of the maxillofacial structures were also generated. RADIATION DOSE REDUCTION: This exam was performed according to the departmental dose-optimization program which includes automated exposure control, adjustment of the mA and/or kV according to patient size and/or use of iterative reconstruction technique. COMPARISON:  1128 2022 FINDINGS: CT HEAD FINDINGS Brain: Age related volume loss. Mild chronic small-vessel ischemic change of the white matter. 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 No sign of acute infarction, mass lesion,  hemorrhage, hydrocephalus or extra-axial collection. Vascular: There is atherosclerotic calcification of the major vessels at the base of the brain. Skull: Negative Other: Chronic maxillary sinus inflammatory changes without acute component. Orbits negative. CT MAXILLOFACIAL FINDINGS Osseous: No facial fracture. Chronic inflammatory changes of the maxillary regions without acute component. Chronic degenerative changes of the temporomandibular joints left more than right. Orbits: Normal.  No orbital injury. Sinuses: No acute sinus inflammation or acute traumatic fluid. Soft tissues: No significant soft tissue finding. IMPRESSION: HEAD CT: No acute or traumatic finding. Age related volume loss. Mild chronic small-vessel ischemic change of the white matter. 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 MAXILLOFACIAL CT: No acute or traumatic finding. Chronic inflammatory changes of the maxillary regions without acute component. Electronically Signed   By: Paulina Fusi M.D.   On: 07/17/2022 13:16   CT MAXILLOFACIAL WO CONTRAST  Result Date: 07/17/2022 CLINICAL DATA:  Trauma to the face and head. Anticoagulated patient. EXAM: CT HEAD WITHOUT CONTRAST CT  MAXILLOFACIAL WITHOUT CONTRAST TECHNIQUE: Multidetector CT imaging of the head and maxillofacial structures were performed using the standard protocol without intravenous contrast. Multiplanar CT image reconstructions of the maxillofacial structures were also generated. RADIATION DOSE REDUCTION: This exam was performed according to the departmental dose-optimization program which includes automated exposure control, adjustment of the mA and/or kV according to patient size and/or use of iterative reconstruction technique. COMPARISON:  1128 2022 FINDINGS: CT HEAD FINDINGS Brain: Age related volume loss. Mild chronic small-vessel ischemic change of the white matter. 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 No sign of acute infarction, mass lesion, hemorrhage, hydrocephalus or extra-axial collection. Vascular: There is atherosclerotic calcification of the major vessels at the base of the brain. Skull: Negative Other: Chronic maxillary sinus inflammatory changes without acute component. Orbits negative. CT MAXILLOFACIAL FINDINGS Osseous: No facial fracture. Chronic inflammatory changes of the maxillary regions without acute component. Chronic degenerative changes of the temporomandibular joints left more than right. Orbits: Normal.  No orbital injury. Sinuses: No acute sinus inflammation or acute traumatic fluid. Soft tissues: No significant soft tissue finding. IMPRESSION: HEAD CT: No acute or traumatic finding. Age related volume loss. Mild chronic small-vessel ischemic change of the white matter. 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 MAXILLOFACIAL CT: No acute or traumatic finding. Chronic inflammatory changes of the maxillary regions without acute component. Electronically Signed   By: Paulina Fusi M.D.   On: 07/17/2022 13:16   DG Chest Port 1 View  Result Date: 07/17/2022 CLINICAL DATA:  Fall. EXAM: PORTABLE CHEST 1 VIEW COMPARISON:  03/14/2022 FINDINGS: Marked rightward patient rotation.  Within this limitation, no definite pneumothorax or substantial pleural effusion. Cardiopericardial silhouette is enlarged. Nodular density projects over the heart, indeterminate on this rotated film. Bones are diffusely demineralized. Fractures of the third and fourth ribs. 4th rib fracture was present on the prior study consistent with nonacute injury. Telemetry leads overlie the chest. IMPRESSION: 1. Age indeterminate fracture of the right third rib. Right fourth rib fracture was visible on the previous study. 2. Nodular density projects over the heart, indeterminate on this rotated film. Follow-up dedicated PA and lateral upright chest x-ray could be used after resolution of acute symptoms. Alternatively, CT chest without contrast may prove helpful to further evaluate. 3. No definite pneumothorax or substantial pleural effusion. Electronically Signed   By: Kennith Center M.D.   On: 07/17/2022 13:00   DG Pelvis Portable  Result Date: 07/17/2022 CLINICAL DATA:  Pain after trauma EXAM: PORTABLE PELVIS  1 VIEWS COMPARISON:  X-ray 08/28/2012 FINDINGS: Osteopenia. No fracture or dislocation. Mild sclerosis along the pubic symphysis. Degenerative changes seen of the visualized lumbar spine. With this level of osteopenia a nondisplaced injury can be acutely radiographically occult. If there is further concern additional cross-sectional imaging as clinically directed. There are some well rounded densities in the pelvis which are indeterminate although possibly vascular. IMPRESSION: Osteopenia.  Mild degenerative changes. Electronically Signed   By: Karen Kays M.D.   On: 07/17/2022 12:59    Procedures Procedures  {Document cardiac monitor, telemetry assessment procedure when appropriate:1}  Medications Ordered in ED Medications - No data to display  ED Course/ Medical Decision Making/ A&P   {   Click here for ABCD2, HEART and other calculatorsREFRESH Note before signing :1}                           Medical Decision Making 89 year old female brought in for evaluation after a mechanical fall causing an abrasion to her face while on blood thinners.  No other focal abnormalities identified on physical exam other than some left shoulder pain.  Full trauma workup and evaluation performed. Imaging of her head and extremity showed no acute abnormalities.  Lab work overall unremarkable.  Patient doing well and vitals are stable.  Family at bedside and all questions answered.  Amount and/or Complexity of Data Reviewed Labs: ordered. Radiology: ordered.     {Document critical care time when appropriate:1} {Document review of labs and clinical decision tools ie heart score, Chads2Vasc2 etc:1}  {Document your independent review of radiology images, and any outside records:1} {Document your discussion with family members, caretakers, and with consultants:1} {Document social determinants of health affecting pt's care:1} {Document your decision making why or why not admission, treatments were needed:1} Final Clinical Impression(s) / ED Diagnoses Final diagnoses:  None    Rx / DC Orders ED Discharge Orders     None

## 2022-07-17 NOTE — Progress Notes (Signed)
Orthopedic Tech Progress Note Patient Details:  Ann Rodriguez Mar 26, 1928 [id number]  Level 2 trauma   Patient ID: Ann Rodriguez, female   DOB: January 22, 1929, 89 y.o.   MRN: [id number]  [name] 07/17/2022, 1:07 PM

## 2022-07-17 NOTE — ED Provider Notes (Signed)
Patient was initially seen by Dr. Hattie Perch.  Please see her note.  Patient presented to the ED for evaluation after a fall.  Patient is Eliquis.  Initial imaging tests were unremarkable.  The patient was still waiting for her CT scan of her chest as there was a questionable rib fracture on the plain film.  CT scan fortunately does not show any evidence of rib fracture or or significant chest injury.  Patient is ready to go home  Evaluation and diagnostic testing in the emergency department does not suggest an emergent condition requiring admission or immediate intervention beyond what has been performed at this time.  The patient is safe for discharge and has been instructed to return immediately for worsening symptoms, change in symptoms or any other concerns.   Linwood Dibbles, MD 07/17/22 [phone]

## 2022-07-17 NOTE — Consult Note (Signed)
Ann Rodriguez 06/14/1928  [id number].    Requesting MD: Lynelle Doctor, MD Chief Complaint/Reason for Consult: Fall  HPI:  Ann Rodriguez is a 89 y/o F with a PMH foot drop, macular degeneration and arrhythmia on Eliquis who presents as a level 2 trauma after a ground-level fall at the gym. States she has footdrop after history of CVA and she lost her footing and fell forward, hitting her face on a hard floor. Denies LOC. Her cc is facial pain and left shoulder pain that radiates around chest with deep inspiration. She is not SOB. She denies HA, extremity pain, SOB, or abdominal pain. Her last dose of Eliquis was this morning. Her daughter is at bedside in the ED.   ROS: Review of Systems  All other systems reviewed and are negative.   Family History  Problem Relation Age of Onset   Diabetes Mother    Diabetes Son     Past Medical History:  Diagnosis Date   Arthritis    Bilateral foot-drop 08/31/2020   Depression    PT'S HUSBAND AND SON HAVE DIED W/IN PAST YEAR 2011/08/17   GERD (gastroesophageal reflux disease)    Hearing loss    Hyperlipidemia    Idiopathic peripheral neuropathy 07/18/2016   Nocturnal leg cramps 01/22/2017   Stroke (HCC)    SVT (supraventricular tachycardia) 09/2014   Thyroid disease     Past Surgical History:  Procedure Laterality Date   APPENDECTOMY     BACK SURGERY     lumbar   BLADDER SURGERY     BUNIONECTOMY     left and right with hammer toe repair   CARPAL TUNNEL RELEASE Bilateral 08-17-15   ELECTROPHYSIOLOGIC STUDY N/A 11/03/2014   Procedure: SVT Ablation;  Surgeon: Marinus Maw, MD;  Location: [facility] INVASIVE CV LAB;  Service: Cardiovascular;  Laterality: N/A;   EYE SURGERY     bilateral cataract extraction with IOL   FOOT SURGERY     KNEE ARTHROSCOPY  02/09/2011   Procedure: ARTHROSCOPY KNEE;  Surgeon: Jacki Cones;  Location: [facility];  Service: Orthopedics;  Laterality: Left;  Left Knee Arthroscopy with Menisectomy medial, abrasion chondroplasty medial,  and synovectomy, supra patella pouch.   NASAL SINUS SURGERY     OOPHORECTOMY     ROTATOR CUFF REPAIR  1/13,  3/13   x 3  right/   SALPINGECTOMY     SHOULDER OPEN ROTATOR CUFF REPAIR Left 06/26/2012   Procedure: LEFT SHOULDER ROTATOR CUFF REPAIR WITH ANCHORS ;  Surgeon: Jacki Cones, MD;  Location: [facility];  Service: Orthopedics;  Laterality: Left;   TOTAL KNEE ARTHROPLASTY  09/05/2011   Procedure: TOTAL KNEE ARTHROPLASTY;  Surgeon: Jacki Cones, MD;  Location: [facility];  Service: Orthopedics;  Laterality: Left;    Social History:  reports that she quit smoking about 63 years ago. Her smoking use included cigarettes. She has a 2.50 pack-year smoking history. She has never used smokeless tobacco. She reports current alcohol use of about 1.0 standard drink of alcohol per week. She reports that she does not use drugs.  Allergies: No Known Allergies  (Not in a hospital admission)    Physical Exam: Blood pressure (!) 148/85, pulse 68, temperature (!) 97.5 F (36.4 C), temperature source Oral, resp. rate 15, height 5\' 5"  (1.651 m), weight 62.1 kg, SpO2 96 %. General: elderly female laying on hospital bed, appears stated age, NAD. HEENT: head -traumatic, small facial abrasion and left periorbital ecchymosis/swelling Neck- Trachea is  midline CV- RRR, normal S1/S2, no M/R/G, radial and dorsalis pedis pulses 2+ BL, no lower extremity edema  Pulm- breathing is non-labored ORA. CTABL, no wheezes, rhales, rhonchi. No chest wall TTP Abd- soft, NT/ND, appropriate bowel sounds in 4 quadrants, no masses, hernias, or organomegaly. GU- deferred  MSK- UE/LE symmetrical, no cyanosis, clubbing, or edema. No Tenderness over L shoulder joint - able to perform partial active ROM.  Neuro- CN II-XII grossly in tact, no paresthesias. Psych- Alert and Oriented x3 with appropriate affect Skin: warm and dry, no rashes or lesions   Results for orders placed or performed during the hospital encounter of  07/17/22 (from the past 48 hour(s))  Comprehensive metabolic panel     Status: Abnormal   Collection Time: 07/17/22 12:18 PM  Result Value Ref Range   Sodium 136 135 - 145 mmol/L   Potassium 4.3 3.5 - 5.1 mmol/L   Chloride 102 98 - 111 mmol/L   CO2 24 22 - 32 mmol/L   Glucose, Bld 112 (H) 70 - 99 mg/dL    Comment: Glucose reference range applies only to samples taken after fasting for at least 8 hours.   BUN 18 8 - 23 mg/dL   Creatinine, Ser 1.61 0.44 - 1.00 mg/dL   Calcium 9.4 8.9 - 09.6 mg/dL   Total Protein 6.6 6.5 - 8.1 g/dL   Albumin 3.7 3.5 - 5.0 g/dL   AST 25 15 - 41 U/L   ALT 19 0 - 44 U/L   Alkaline Phosphatase 67 38 - 126 U/L   Total Bilirubin 1.0 0.3 - 1.2 mg/dL   GFR, Estimated >04 >54 mL/min    Comment: (NOTE) Calculated using the CKD-EPI Creatinine Equation (2021)    Anion gap 10 5 - 15    Comment: Performed at [facility], [address], [place], [place] [address]  CBC     Status: None   Collection Time: 07/17/22 12:18 PM  Result Value Ref Range   WBC 8.3 4.0 - 10.5 K/uL   RBC 4.41 3.87 - 5.11 MIL/uL   Hemoglobin 13.5 12.0 - 15.0 g/dL   HCT 91.4 78.2 - 95.6 %   MCV 92.5 80.0 - 100.0 fL   MCH 30.6 26.0 - 34.0 pg   MCHC 33.1 30.0 - 36.0 g/dL   RDW 21.3 08.6 - 57.8 %   Platelets 257 150 - 400 K/uL   nRBC 0.0 0.0 - 0.2 %    Comment: Performed at [facility], [address] [place], [place] [address]  Protime-INR     Status: Abnormal   Collection Time: 07/17/22 12:18 PM  Result Value Ref Range   Prothrombin Time 15.4 (H) 11.4 - 15.2 seconds   INR 1.2 0.8 - 1.2    Comment: (NOTE) INR goal varies based on device and disease states. Performed at [facility], [address] [place], [place] [address]   Sample to Blood Bank     Status: None   Collection Time: 07/17/22 12:29 PM  Result Value Ref Range   Blood Bank Specimen SAMPLE AVAILABLE FOR TESTING    Sample Expiration      07/20/2022,2359 Performed at [facility], [address], [place], [place] [address]   I-Stat Chem 8, ED     Status: Abnormal   Collection Time: 07/17/22 12:35 PM  Result Value Ref Range   Sodium 139 135 - 145 mmol/L   Potassium 4.4 3.5 - 5.1 mmol/L  Chloride 105 98 - 111 mmol/L   BUN 20 8 - 23 mg/dL   Creatinine, Ser 4.09 0.44 - 1.00 mg/dL   Glucose, Bld 811 (H) 70 - 99 mg/dL    Comment: Glucose reference range applies only to samples taken after fasting for at least 8 hours.   Calcium, Ion 1.20 1.15 - 1.40 mmol/L   TCO2 25 22 - 32 mmol/L   Hemoglobin 13.6 12.0 - 15.0 g/dL   HCT 91.4 78.2 - 95.6 %  Lactic acid, plasma     Status: None   Collection Time: 07/17/22 12:54 PM  Result Value Ref Range   Lactic Acid, Venous 1.3 0.5 - 1.9 mmol/L    Comment: Performed at [facility], [address] [place], [place] [address]  Ethanol     Status: None   Collection Time: 07/17/22 12:56 PM  Result Value Ref Range   Alcohol, Ethyl (B) <10 <10 mg/dL    Comment: (NOTE) Lowest detectable limit for serum alcohol is 10 mg/dL.  For medical purposes only. Performed at [facility], [address] [place], [place] [address]    DG Shoulder Left  Result Date: 07/17/2022 CLINICAL DATA:  Provided history: Left shoulder pain status post fall. EXAM: LEFT SHOULDER - 2+ VIEW COMPARISON:  Prior chest radiographs 03/14/2022 and earlier. FINDINGS: Due to patient discomfort, an axillary view radiograph could not be acquired. Within this limitation, findings are as follows. No evidence of acute fracture or dislocation. Redemonstrated chronic widening of the left acromioclavicular joint consist with prior left acromioclavicular joint injury. IMPRESSION: 1. Due to patient discomfort, an axillary view radiograph could not be acquired. 2. Within this limitation, there is no evidence of an acute fracture or dislocation. 3. Redemonstrated chronic widening of the left acromioclavicular joint consistent with prior left acromioclavicular joint injury.  Electronically Signed   By: Jackey Loge D.O.   On: 07/17/2022 13:25   CT HEAD WO CONTRAST  Result Date: 07/17/2022 CLINICAL DATA:  Trauma to the face and head. Anticoagulated patient. EXAM: CT HEAD WITHOUT CONTRAST CT MAXILLOFACIAL WITHOUT CONTRAST TECHNIQUE: Multidetector CT imaging of the head and maxillofacial structures were performed using the standard protocol without intravenous contrast. Multiplanar CT image reconstructions of the maxillofacial structures were also generated. RADIATION DOSE REDUCTION: This exam was performed according to the departmental dose-optimization program which includes automated exposure control, adjustment of the mA and/or kV according to patient size and/or use of iterative reconstruction technique. COMPARISON:  1128 2022 FINDINGS: CT HEAD FINDINGS Brain: Age related volume loss. Mild chronic small-vessel ischemic change of the white matter. 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 No sign of acute infarction, mass lesion, hemorrhage, hydrocephalus or extra-axial collection. Vascular: There is atherosclerotic calcification of the major vessels at the base of the brain. Skull: Negative Other: Chronic maxillary sinus inflammatory changes without acute component. Orbits negative. CT MAXILLOFACIAL FINDINGS Osseous: No facial fracture. Chronic inflammatory changes of the maxillary regions without acute component. Chronic degenerative changes of the temporomandibular joints left more than right. Orbits: Normal.  No orbital injury. Sinuses: No acute sinus inflammation or acute traumatic fluid. Soft tissues: No significant soft tissue finding. IMPRESSION: HEAD CT: No acute or traumatic finding. Age related volume loss. Mild chronic small-vessel ischemic change of the white matter. 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 MAXILLOFACIAL CT: No acute or traumatic finding. Chronic inflammatory changes of the maxillary regions without acute component. Electronically  Signed   By: Scherrie Bateman.D.  On: 07/17/2022 13:16   CT MAXILLOFACIAL WO CONTRAST  Result Date: 07/17/2022 CLINICAL DATA:  Trauma to the face and head. Anticoagulated patient. EXAM: CT HEAD WITHOUT CONTRAST CT MAXILLOFACIAL WITHOUT CONTRAST TECHNIQUE: Multidetector CT imaging of the head and maxillofacial structures were performed using the standard protocol without intravenous contrast. Multiplanar CT image reconstructions of the maxillofacial structures were also generated. RADIATION DOSE REDUCTION: This exam was performed according to the departmental dose-optimization program which includes automated exposure control, adjustment of the mA and/or kV according to patient size and/or use of iterative reconstruction technique. COMPARISON:  1128 2022 FINDINGS: CT HEAD FINDINGS Brain: Age related volume loss. Mild chronic small-vessel ischemic change of the white matter. 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 No sign of acute infarction, mass lesion, hemorrhage, hydrocephalus or extra-axial collection. Vascular: There is atherosclerotic calcification of the major vessels at the base of the brain. Skull: Negative Other: Chronic maxillary sinus inflammatory changes without acute component. Orbits negative. CT MAXILLOFACIAL FINDINGS Osseous: No facial fracture. Chronic inflammatory changes of the maxillary regions without acute component. Chronic degenerative changes of the temporomandibular joints left more than right. Orbits: Normal.  No orbital injury. Sinuses: No acute sinus inflammation or acute traumatic fluid. Soft tissues: No significant soft tissue finding. IMPRESSION: HEAD CT: No acute or traumatic finding. Age related volume loss. Mild chronic small-vessel ischemic change of the white matter. 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 MAXILLOFACIAL CT: No acute or traumatic finding. Chronic inflammatory changes of the maxillary regions without acute component. Electronically Signed    By: Paulina Fusi M.D.   On: 07/17/2022 13:16   DG Chest Port 1 View  Result Date: 07/17/2022 CLINICAL DATA:  Fall. EXAM: PORTABLE CHEST 1 VIEW COMPARISON:  03/14/2022 FINDINGS: Marked rightward patient rotation. Within this limitation, no definite pneumothorax or substantial pleural effusion. Cardiopericardial silhouette is enlarged. Nodular density projects over the heart, indeterminate on this rotated film. Bones are diffusely demineralized. Fractures of the third and fourth ribs. 4th rib fracture was present on the prior study consistent with nonacute injury. Telemetry leads overlie the chest. IMPRESSION: 1. Age indeterminate fracture of the right third rib. Right fourth rib fracture was visible on the previous study. 2. Nodular density projects over the heart, indeterminate on this rotated film. Follow-up dedicated PA and lateral upright chest x-ray could be used after resolution of acute symptoms. Alternatively, CT chest without contrast may prove helpful to further evaluate. 3. No definite pneumothorax or substantial pleural effusion. Electronically Signed   By: Kennith Center M.D.   On: 07/17/2022 13:00   DG Pelvis Portable  Result Date: 07/17/2022 CLINICAL DATA:  Pain after trauma EXAM: PORTABLE PELVIS 1 VIEWS COMPARISON:  X-ray 08/28/2012 FINDINGS: Osteopenia. No fracture or dislocation. Mild sclerosis along the pubic symphysis. Degenerative changes seen of the visualized lumbar spine. With this level of osteopenia a nondisplaced injury can be acutely radiographically occult. If there is further concern additional cross-sectional imaging as clinically directed. There are some well rounded densities in the pelvis which are indeterminate although possibly vascular. IMPRESSION: Osteopenia.  Mild degenerative changes. Electronically Signed   By: Karen Kays M.D.   On: 07/17/2022 12:59      Assessment/Plan 89 y/o F on Eliquis s/p GLF  - hemodynamically stable - CT head/maxillofacial are negative  -  CT chest pending - clear for discharge from trauma standpoint if negative  - trauma workup negative for ICH, fracture, or injury of the shoulder/chest. If pain controlled and able to  mobilize safely she can be discharged home with outpatient PCP follow up in one week. Recommend to hold eliquis until seen by her PCP for risk/benefit discussion of continuing her anticoagulation.    I reviewed ED provider notes, last 24 h vitals and pain scores, last 48 h intake and output, last 24 h labs and trends, and last 24 h imaging results.  Trixie Deis, [facility] 07/17/2022, 3:53 PM Please see Amion for pager number during day hours 7:00am-4:30pm or 7:00am -11:30am on weekends

## 2022-07-17 NOTE — ED Notes (Signed)
Pt to CT

## 2022-07-19 DIAGNOSIS — M549 Dorsalgia, unspecified: Secondary | ICD-10-CM | POA: Diagnosis not present

## 2022-07-19 DIAGNOSIS — Z9181 History of falling: Secondary | ICD-10-CM | POA: Diagnosis not present

## 2022-07-23 DIAGNOSIS — K080 Exfoliation of teeth due to systemic causes: Secondary | ICD-10-CM | POA: Diagnosis not present

## 2022-07-27 DIAGNOSIS — H353132 Nonexudative age-related macular degeneration, bilateral, intermediate dry stage: Secondary | ICD-10-CM | POA: Diagnosis not present

## 2022-08-07 ENCOUNTER — Emergency Department (HOSPITAL_BASED_OUTPATIENT_CLINIC_OR_DEPARTMENT_OTHER): Payer: Medicare Other | Admitting: Radiology

## 2022-08-07 ENCOUNTER — Telehealth: Payer: Self-pay | Admitting: Cardiology

## 2022-08-07 ENCOUNTER — Emergency Department (HOSPITAL_BASED_OUTPATIENT_CLINIC_OR_DEPARTMENT_OTHER): Payer: Medicare Other

## 2022-08-07 ENCOUNTER — Ambulatory Visit: Payer: Medicare Other | Admitting: General Practice

## 2022-08-07 ENCOUNTER — Encounter (HOSPITAL_BASED_OUTPATIENT_CLINIC_OR_DEPARTMENT_OTHER): Payer: Self-pay

## 2022-08-07 ENCOUNTER — Other Ambulatory Visit: Payer: Self-pay

## 2022-08-07 ENCOUNTER — Emergency Department (HOSPITAL_BASED_OUTPATIENT_CLINIC_OR_DEPARTMENT_OTHER)
Admission: EM | Admit: 2022-08-07 | Discharge: 2022-08-07 | Disposition: A | Discharge status: Home / Self Care | Payer: Medicare Other | Attending: Emergency Medicine | Admitting: Emergency Medicine

## 2022-08-07 DIAGNOSIS — R4701 Aphasia: Secondary | ICD-10-CM | POA: Diagnosis not present

## 2022-08-07 DIAGNOSIS — Z79899 Other long term (current) drug therapy: Secondary | ICD-10-CM | POA: Diagnosis not present

## 2022-08-07 DIAGNOSIS — I480 Paroxysmal atrial fibrillation: Secondary | ICD-10-CM | POA: Insufficient documentation

## 2022-08-07 DIAGNOSIS — I4891 Unspecified atrial fibrillation: Secondary | ICD-10-CM | POA: Diagnosis not present

## 2022-08-07 DIAGNOSIS — R0602 Shortness of breath: Secondary | ICD-10-CM | POA: Insufficient documentation

## 2022-08-07 DIAGNOSIS — I6523 Occlusion and stenosis of bilateral carotid arteries: Secondary | ICD-10-CM | POA: Diagnosis not present

## 2022-08-07 DIAGNOSIS — R4182 Altered mental status, unspecified: Secondary | ICD-10-CM | POA: Diagnosis not present

## 2022-08-07 DIAGNOSIS — Z7901 Long term (current) use of anticoagulants: Secondary | ICD-10-CM | POA: Insufficient documentation

## 2022-08-07 DIAGNOSIS — J9811 Atelectasis: Secondary | ICD-10-CM | POA: Diagnosis not present

## 2022-08-07 LAB — DIFFERENTIAL
Abs Immature Granulocytes: 0.01 10*3/uL (ref 0.00–0.07)
Basophils Absolute: 0 10*3/uL (ref 0.0–0.1)
Basophils Relative: 0 %
Eosinophils Absolute: 0.1 10*3/uL (ref 0.0–0.5)
Eosinophils Relative: 1 %
Immature Granulocytes: 0 %
Lymphocytes Relative: 18 %
Lymphs Abs: 1.4 10*3/uL (ref 0.7–4.0)
Monocytes Absolute: 0.8 10*3/uL (ref 0.1–1.0)
Monocytes Relative: 10 %
Neutro Abs: 5.4 10*3/uL (ref 1.7–7.7)
Neutrophils Relative %: 71 %

## 2022-08-07 LAB — ETHANOL: Alcohol, Ethyl (B): <10 mg/dL (ref <10)

## 2022-08-07 LAB — COMPREHENSIVE METABOLIC PANEL
ALT: 13 U/L (ref 0–44)
AST: 18 U/L (ref 15–41)
Albumin: 4.3 g/dL (ref 3.5–5.0)
Alkaline Phosphatase: 79 U/L (ref 38–126)
Anion gap: 8 (ref 5–15)
BUN: 21 mg/dL (ref 8–23)
CO2: 27 mmol/L (ref 22–32)
Calcium: 9.4 mg/dL (ref 8.9–10.3)
Chloride: 106 mmol/L (ref 98–111)
Creatinine, Ser: 0.62 mg/dL (ref 0.44–1.00)
GFR, Estimated: >60 mL/min (ref >60)
Glucose, Bld: 105 mg/dL — ABNORMAL HIGH (ref 70–99)
Potassium: 4.2 mmol/L (ref 3.5–5.1)
Sodium: 141 mmol/L (ref 135–145)
Total Bilirubin: 0.9 mg/dL (ref 0.3–1.2)
Total Protein: 6.6 g/dL (ref 6.5–8.1)

## 2022-08-07 LAB — CBC
HCT: 39.8 % (ref 36.0–46.0)
Hemoglobin: 12.6 g/dL (ref 12.0–15.0)
MCH: 30.2 pg (ref 26.0–34.0)
MCHC: 31.7 g/dL (ref 30.0–36.0)
MCV: 95.4 fL (ref 80.0–100.0)
Platelets: 261 10*3/uL (ref 150–400)
RBC: 4.17 MIL/uL (ref 3.87–5.11)
RDW: 14.3 % (ref 11.5–15.5)
WBC: 7.6 10*3/uL (ref 4.0–10.5)
nRBC: 0 % (ref 0.0–0.2)

## 2022-08-07 LAB — APTT: aPTT: 31 seconds (ref 24–36)

## 2022-08-07 LAB — BRAIN NATRIURETIC PEPTIDE: B Natriuretic Peptide: 249 pg/mL — ABNORMAL HIGH (ref 0.0–100.0)

## 2022-08-07 LAB — PROTIME-INR
INR: 1.1 (ref 0.8–1.2)
Prothrombin Time: 13.9 seconds (ref 11.4–15.2)

## 2022-08-07 MED ORDER — IOHEXOL 350 MG/ML SOLN
100.0000 mL | Freq: Once | INTRAVENOUS | Status: AC | PRN
  Administered 2022-08-07: 75 mL via INTRAVENOUS

## 2022-08-07 NOTE — Telephone Encounter (Signed)
Patient states she was talking to her daughter in law on Saturday and started to slur her words and couldn't speak. She felt she was having a TIA. Daughter in law called EMS and she declined to go to the ED because she stated after 30 minutes she felt it had passed and she was able to talk again. Currently she states she is ok but has been experiencing shortness of breath. She could not get her blood pressure machine to work. She denies any chest pain or discomfort, headache or dizziness. I did advise that she contact PCP about her current symptoms being that her symptoms are not heart related if she did have TIA.  Rich Brave NP had an opening today at 245 pm. I discussed symptoms with him and he agrees patient needs to contact PCP. He states her symptoms does not seem heart related.   Patient stated she will cancel appointment and reach out to pcp.

## 2022-08-07 NOTE — Telephone Encounter (Signed)
Patient called stating she thinks she had a TIA on Saturday,  she states she was checked out by EMS and didn't go the the emergency room to be evaluated. She was not in control of speaking, she states she was trying to get the words out and they wouldn't come out, and she was dizzy.  She states she currently has no energy, she she due to go the the beach for two weeks with her family.  She wants to know if she should come in to the office to be seen. Please advise.

## 2022-08-07 NOTE — ED Notes (Signed)
Spoke with lab to add on BNP 

## 2022-08-07 NOTE — ED Provider Notes (Signed)
[place] EMERGENCY DEPARTMENT AT [facility] Provider Note   CSN: [id number] Arrival date & time: 08/07/22  1547     History  Chief Complaint  Patient presents with   Aphasia    ANGILA LAPIETRA is a 89 y.o. female.  HPI Patient presented with difficulty speaking.  On Saturday which was 3 days ago patient had difficulty speaking for around 40 minutes.  Resolved.  Was feeling mildly short of breath at the time.  Has been doing well besides some shortness of breath since.  No numbness weakness.  No confusion.  States she had a similar episode years ago when she was post to have an ablation.  Workup in the hospital done without stroke.  Thought to be TIA.  Has a history of A-fib's and is on anticoagulation.   Past Medical History:  Diagnosis Date   Arthritis    Bilateral foot-drop 08/31/2020   Depression    PT'S HUSBAND AND SON HAVE DIED W/IN PAST YEAR 2011/09/02   GERD (gastroesophageal reflux disease)    Hearing loss    Hyperlipidemia    Idiopathic peripheral neuropathy 07/18/2016   Nocturnal leg cramps 01/22/2017   Stroke (HCC)    SVT (supraventricular tachycardia) 09/2014   Thyroid disease     Home Medications Prior to Admission medications   Medication Sig Start Date End Date Taking? Authorizing Provider  HYDROcodone-acetaminophen (NORCO/VICODIN) 5-325 MG tablet Take 1 tablet by mouth every 6 (six) hours as needed. Every 6-8 hours 07/19/22  Yes [provider]  acetaminophen (TYLENOL) 500 MG tablet Take 2 tablets (1,000 mg total) by mouth every 6 (six) hours as needed for mild pain or fever. 02/08/21   Juliet Rude, PA-C  albuterol (VENTOLIN HFA) 108 (90 Base) MCG/ACT inhaler SMARTSIG:1 Puff(s) By Mouth Every 4-6 Hours PRN 01/30/22   [provider]  apixaban (ELIQUIS) 5 MG TABS tablet Take 1 tablet (5 mg total) by mouth 2 (two) times daily. 10/25/21   Lyn Records, MD  atorvastatin (LIPITOR) 40 MG tablet Take 40 mg by mouth daily.    [provider]  BIOTIN PO Take 1 tablet by mouth daily.    [provider]  Budeson-Glycopyrrol-Formoterol (BREZTRI AEROSPHERE) 160-9-4.8 MCG/ACT AERO Inhale 2 puffs into the lungs 2 (two) times daily. 04/12/22   Luciano Cutter, MD  cholecalciferol (VITAMIN D) 1000 UNITS tablet Take 1,000 Units by mouth daily.    [provider]  escitalopram (LEXAPRO) 10 MG tablet Take 10 mg by mouth daily. 04/01/17   [provider]  lidocaine (LIDODERM) 5 % Place 1 patch onto the skin daily. Remove & Discard patch within 12 hours or as directed by MD 07/17/22   Linwood Dibbles, MD  metoprolol succinate (TOPROL XL) 25 MG 24 hr tablet Take 1 tablet (25 mg total) by mouth daily. 07/02/22   Lewayne Bunting, MD  Multiple Vitamins-Minerals (PRESERVISION AREDS 2 PO) Take by mouth.    [provider]  polyethylene glycol (MIRALAX / GLYCOLAX) packet Take 17 g by mouth daily.    [provider]  traMADol (ULTRAM) 50 MG tablet Take 50 mg by mouth as needed for moderate pain. 07/21/21   [provider]  traZODone (DESYREL) 50 MG tablet Take 75 mg by mouth at bedtime as needed for sleep. 01/08/21   [provider]  vitamin B-12 (CYANOCOBALAMIN) 100 MCG tablet Take 100 mcg by mouth daily.    [provider]      Allergies  Patient has no known allergies.    Review of Systems   Review of Systems  Physical Exam Updated Vital Signs BP (!) 139/112 (BP Location: Right Arm)   Pulse 99   Temp 98.8 F (37.1 C) (Oral)   Resp 20   Ht 5\' 5"  (1.651 m)   Wt 62.1 kg   SpO2 100%   BMI 22.80 kg/m  Physical Exam Vitals and nursing note reviewed.  HENT:     Head: Normocephalic.  Cardiovascular:     Rate and Rhythm: Normal rate. Rhythm irregular.  Pulmonary:     Breath sounds: No wheezing.  Abdominal:     Tenderness: There is no abdominal tenderness.  Musculoskeletal:        General: No tenderness.  Skin:    General: Skin is warm.     Capillary  Refill: Capillary refill takes more than 3 seconds.  Neurological:     Mental Status: She is alert and oriented to person, place, and time.     Comments: Awake and appropriate.  Normal speech.  Does have chronic foot drop on the left and wears a brace.     ED Results / Procedures / Treatments   Labs (all labs ordered are listed, but only abnormal results are displayed) Labs Reviewed  COMPREHENSIVE METABOLIC PANEL - Abnormal; Notable for the following components:      Result Value   Glucose, Bld 105 (*)    All other components within normal limits  BRAIN NATRIURETIC PEPTIDE - Abnormal; Notable for the following components:   B Natriuretic Peptide 249.0 (*)    All other components within normal limits  PROTIME-INR  APTT  CBC  DIFFERENTIAL  ETHANOL  CBG MONITORING, ED    EKG EKG Interpretation  Date/Time:  Tuesday Aug 07 2022 15:57:20 EDT Ventricular Rate:  116 PR Interval:    QRS Duration: 94 QT Interval:  356 QTC Calculation: 495 R Axis:   11 Text Interpretation: Atrial fibrillation Multiple ventricular premature complexes Minimal ST depression, diffuse leads Borderline prolonged QT interval Confirmed by Benjiman Core [phone]) on 08/07/2022 4:13:39 PM  Radiology CT ANGIO HEAD NECK W WO CM  Result Date: 08/07/2022 CLINICAL DATA:  Episode of aphasia lasting 40 minutes 3 days ago. EXAM: CT ANGIOGRAPHY HEAD AND NECK WITH AND WITHOUT CONTRAST TECHNIQUE: Multidetector CT imaging of the head and neck was performed using the standard protocol during bolus administration of intravenous contrast. Multiplanar CT image reconstructions and MIPs were obtained to evaluate the vascular anatomy. Carotid stenosis measurements (when applicable) are obtained utilizing NASCET criteria, using the distal internal carotid diameter as the denominator. RADIATION DOSE REDUCTION: This exam was performed according to the departmental dose-optimization program which includes automated exposure control,  adjustment of the mA and/or kV according to patient size and/or use of iterative reconstruction technique. CONTRAST:  75mL OMNIPAQUE IOHEXOL 350 MG/ML SOLN COMPARISON:  CT head 07/17/2022 FINDINGS: CT HEAD FINDINGS Brain: There is no acute intracranial hemorrhage, extra-axial fluid collection, or acute territorial infarct. Parenchymal volume is normal. The ventricles are normal in size. Patchy hypodensity in the supratentorial white matter likely reflecting sequela of underlying chronic small-vessel ischemic change is stable. A probable small remote lacunar infarct in the left basal ganglia and a remote infarct in the left midbrain are unchanged. No cortical encephalomalacia is seen. The pituitary and suprasellar region are normal. There is no mass lesion. There is no mass effect or midline shift. Vascular: See below. Skull: Normal. Negative for fracture or focal lesion. Sinuses/Orbits: The  paranasal sinuses are clear. Bilateral lens implants are in place. The globes and orbits are otherwise unremarkable. Other: None. Review of the MIP images confirms the above findings CTA NECK FINDINGS Aortic arch: There is calcified plaque in the imaged aortic arch. The origins of the major branch vessels are patent. The subclavian arteries are patent to the level imaged. Right carotid system: The right common, internal, and external carotid arteries are patent, with mild plaque of the bifurcation but no hemodynamically significant stenosis or occlusion. There is no evidence of dissection or aneurysm. Left carotid system: The left common, internal, and external carotid arteries are patent, with mild plaque of the bifurcation but no hemodynamically significant stenosis or occlusion. There is no evidence of dissection or aneurysm. Vertebral arteries: The vertebral arteries are patent, without hemodynamically significant stenosis or occlusion. There is no evidence of dissection or aneurysm. Skeleton: There is no acute osseous  abnormality or suspicious osseous lesion. There is grade 1 anterolisthesis of C4 on C5 and disc space narrowing degenerative endplate change most advanced at C5-C6. There is no visible canal hematoma. Other neck: A left thyroid nodule is noted requiring no specific imaging follow up. The soft tissues of the neck are otherwise unremarkable. Upper chest: The imaged lung apices are clear. Review of the MIP images confirms the above findings CTA HEAD FINDINGS Anterior circulation: There is mild calcified plaque in the intracranial ICAs without significant stenosis or occlusion. The bilateral MCAs are patent, without proximal stenosis or occlusion. The bilateral ACAs are patent, without proximal stenosis or occlusion. The anterior communicating artery is normal. There is no aneurysm or AVM. Posterior circulation: The bilateral V4 segments are patent. The basilar artery is patent. The major cerebellar arteries appear patent. The bilateral PCAs are patent, without proximal stenosis or occlusion. Bilateral posterior communicating arteries are identified. There is no aneurysm or AVM. Venous sinuses: Patent. Anatomic variants: None. Review of the MIP images confirms the above findings IMPRESSION: 1. Stable noncontrast head CT with no acute intracranial pathology. 2. Patent vasculature of the head and neck with no hemodynamically significant stenosis, occlusion, or dissection. Electronically Signed   By: Lesia Hausen M.D.   On: 08/07/2022 17:29   DG Chest 2 View  Result Date: 08/07/2022 CLINICAL DATA:  Episode of aphasia lasting 40 minutes. EXAM: CHEST - 2 VIEW COMPARISON:  Jul 17, 2022 FINDINGS: The cardiac silhouette is mildly enlarged and unchanged in size. There is marked severity calcification and tortuosity of the thoracic aorta. The lungs are hyperinflated. Mild linear atelectasis is seen within the right lung base and along the infrahilar region on the left. There is no evidence of an acute infiltrate, pleural  effusion or pneumothorax. Chronic bilateral rib fractures are noted. Marked severity levoscoliosis of the thoracic spine is seen with evidence of prior vertebroplasty noted within the midthoracic spine. IMPRESSION: Stable cardiomegaly right basilar and left infrahilar linear atelectasis. Electronically Signed   By: Aram Candela M.D.   On: 08/07/2022 17:25    Procedures Procedures    Medications Ordered in ED Medications  iohexol (OMNIPAQUE) 350 MG/ML injection 100 mL (75 mLs Intravenous Contrast Given 08/07/22 1650)    ED Course/ Medical Decision Making/ A&P         CHA2DS2-VASc Score: 5                    Medical Decision Making Amount and/or Complexity of Data Reviewed Labs: ordered. Radiology: ordered.  Risk Prescription drug management.   Patient with difficulty  speaking 3 days ago.  Resolved now.  Also some shortness of breath.  Appears to be in A-fib but history of paroxysmal A-fib.  Is on anticoagulation.  Will get head CT with angiography.  TIA considered.  Stroke felt less likely with no deficits now.  Also has had some shortness of breath.  Will get basic blood work and x-ray.  Reviewed cardiology note.  Called cardiology then sent to PCP then sent to urgent care.  X-ray reassuring.  BNP mildly elevated.  Head CT and CTA done due to evaluate for new cause for TIA.  However workup reassuring.  Has A-fib but rate appears controlled.  Has cardiology follow-up in 3 days.  Appears stable for discharge home.  Already on anticoagulation.  Discussed with patient about follow-up for potential TIA but think worth risk of not admission at this time.        Final Clinical Impression(s) / ED Diagnoses Final diagnoses:  Aphasia  Paroxysmal atrial fibrillation [facility])    Rx / DC Orders ED Discharge Orders     None         Benjiman Core, MD 08/07/22 1759

## 2022-08-07 NOTE — ED Triage Notes (Signed)
Patient here POV from Home.  Endorses having an episode of Aphasia lasting 40 minutes on Saturday (3 Days ago). Since then it has not been present. Takes Eliquis for Atrial Fib.   NAD Noted during triage. A&Ox4. Gcs 15. Ambulatory.

## 2022-08-07 NOTE — ED Notes (Signed)
Pt verbalized understanding of d/c instructions, meds, and followup care. Denies questions. VSS, no distress noted. Steady gait to exit with all belongings.  ?

## 2022-08-07 NOTE — Discharge Instructions (Signed)
Follow-up with your doctor for further management of the difficulty speaking.  Follow-up with cardiology on Friday as planned.

## 2022-08-10 ENCOUNTER — Ambulatory Visit: Payer: Medicare Other | Attending: Cardiology | Admitting: Cardiology

## 2022-08-10 ENCOUNTER — Encounter: Payer: Self-pay | Admitting: Cardiology

## 2022-08-10 VITALS — BP 114/60 | HR 98 | Ht 65.0 in | Wt 137.0 lb

## 2022-08-10 DIAGNOSIS — I2510 Atherosclerotic heart disease of native coronary artery without angina pectoris: Secondary | ICD-10-CM | POA: Diagnosis not present

## 2022-08-10 DIAGNOSIS — I5032 Chronic diastolic (congestive) heart failure: Secondary | ICD-10-CM | POA: Diagnosis not present

## 2022-08-10 DIAGNOSIS — R0609 Other forms of dyspnea: Secondary | ICD-10-CM | POA: Diagnosis not present

## 2022-08-10 DIAGNOSIS — I480 Paroxysmal atrial fibrillation: Secondary | ICD-10-CM | POA: Diagnosis not present

## 2022-08-10 MED ORDER — AMIODARONE HCL 200 MG PO TABS: 200.0000 mg | ORAL_TABLET | Freq: Every day | ORAL | 3 refills | Status: DC

## 2022-08-10 MED ORDER — AMIODARONE HCL 200 MG PO TABS: ORAL_TABLET | ORAL | 0 refills | Status: DC

## 2022-08-10 NOTE — Progress Notes (Signed)
[id number] atrial fibrillation and chronic diastolic congestive heart failure.  Patient previously followed by Dr. Katrinka Blazing but transitioning to me.  Patient had ablation of AVNRT August 2016.  Carotid Dopplers August 2016 showed 1 to 39% bilateral stenosis.  Chest CT November 2022 showed coronary calcification.  ABIs 06/17/2023normal bilaterally.  Monitor August 2023 showed atrial fibrillation/flutter 8% burden and she was placed on flecainide and apixaban.  Nuclear study January 2024 showed no ischemia or infarction and gated ejection fraction 36% but visually appears better.  Monitor January 2024 showed sinus rhythm with occasional PAC, PVC, brief PAT and 3 beats nonsustained ventricular tachycardia.  Echocardiogram February 2024 showed normal LV function, grade 1 diastolic dysfunction, mild left atrial enlargement, mild mitral regurgitation, mild aortic insufficiency.  Flecainide was ultimately discontinued due to coronary calcification.  Since last seen pt complains of recurrent atrial fibrillation, increased fatigue and DOE; no CP or bleeding.  Current Outpatient Medications  Medication Sig Dispense Refill   acetaminophen (TYLENOL) 500 MG tablet Take 2 tablets (1,000 mg total) by mouth every 6 (six) hours as needed for mild pain or fever.     albuterol (VENTOLIN HFA) 108 (90 Base) MCG/ACT inhaler SMARTSIG:1 Puff(s) By Mouth Every 4-6 Hours PRN     apixaban (ELIQUIS) 5 MG TABS tablet Take 1 tablet (5 mg total) by mouth 2 (two) times daily. 60 tablet 11   atorvastatin (LIPITOR) 40 MG tablet Take 40 mg by mouth daily.     BIOTIN PO Take 1 tablet by mouth daily.     Budeson-Glycopyrrol-Formoterol (BREZTRI AEROSPHERE) 160-9-4.8 MCG/ACT AERO Inhale 2 puffs into the lungs 2 (two) times daily. 10.7 g 5   cholecalciferol (VITAMIN D) 1000 UNITS tablet Take 1,000 Units by mouth daily.     escitalopram (LEXAPRO) 10 MG tablet Take 10 mg by mouth daily.  0   HYDROcodone-acetaminophen (NORCO/VICODIN)  5-325 MG tablet Take 1 tablet by mouth every 6 (six) hours as needed. Every 6-8 hours     lidocaine (LIDODERM) 5 % Place 1 patch onto the skin daily. Remove & Discard patch within 12 hours or as directed by MD 14 patch 0   metoprolol succinate (TOPROL XL) 25 MG 24 hr tablet Take 1 tablet (25 mg total) by mouth daily. 90 tablet 3   Multiple Vitamins-Minerals (PRESERVISION AREDS 2 PO) Take by mouth.     polyethylene glycol (MIRALAX / GLYCOLAX) packet Take 17 g by mouth daily.     traMADol (ULTRAM) 50 MG tablet Take 50 mg by mouth as needed for moderate pain.     traZODone (DESYREL) 50 MG tablet Take 75 mg by mouth at bedtime as needed for sleep.     vitamin B-12 (CYANOCOBALAMIN) 100 MCG tablet Take 100 mcg by mouth daily.     No current facility-administered medications for this visit.     Past Medical History:  Diagnosis Date   Arthritis    Bilateral foot-drop 08/31/2020   Depression    PT'S HUSBAND AND SON HAVE DIED W/IN PAST YEAR 08/27/2011   GERD (gastroesophageal reflux disease)    Hearing loss    Hyperlipidemia    Idiopathic peripheral neuropathy 07/18/2016   Nocturnal leg cramps 01/22/2017   Stroke [facility])    SVT (supraventricular tachycardia) 09/2014   Thyroid disease     Past Surgical History:  Procedure Laterality Date   APPENDECTOMY     BACK SURGERY     lumbar   BLADDER SURGERY     BUNIONECTOMY  left and right with hammer toe repair   CARPAL TUNNEL RELEASE Bilateral 2017   ELECTROPHYSIOLOGIC STUDY N/A 11/03/2014   Procedure: SVT Ablation;  Surgeon: Marinus Maw, MD;  Location: [facility] INVASIVE CV LAB;  Service: Cardiovascular;  Laterality: N/A;   EYE SURGERY     bilateral cataract extraction with IOL   FOOT SURGERY     KNEE ARTHROSCOPY  02/09/2011   Procedure: ARTHROSCOPY KNEE;  Surgeon: Jacki Cones;  Location: [facility] ORS;  Service: Orthopedics;  Laterality: Left;  Left Knee Arthroscopy with Menisectomy medial, abrasion chondroplasty medial, and synovectomy, supra patella  pouch.   NASAL SINUS SURGERY     OOPHORECTOMY     ROTATOR CUFF REPAIR  1/13,  3/13   x 3  right/   SALPINGECTOMY     SHOULDER OPEN ROTATOR CUFF REPAIR Left 06/26/2012   Procedure: LEFT SHOULDER ROTATOR CUFF REPAIR WITH ANCHORS ;  Surgeon: Jacki Cones, MD;  Location: [facility] ORS;  Service: Orthopedics;  Laterality: Left;   TOTAL KNEE ARTHROPLASTY  09/05/2011   Procedure: TOTAL KNEE ARTHROPLASTY;  Surgeon: Jacki Cones, MD;  Location: [facility] ORS;  Service: Orthopedics;  Laterality: Left;    Social History   Socioeconomic History   Marital status: Widowed    Spouse name: Not on file   Number of children: 2   Years of education: Not on file   Highest education level: Not on file  Occupational History   Occupation: Retired  Tobacco Use   Smoking status: Former    Packs/day: 0.50    Years: 5.00    Additional pack years: 0.00    Total pack years: 2.50    Types: Cigarettes    Quit date: 02/05/1959    Years since quitting: 63.5   Smokeless tobacco: Never  Substance and Sexual Activity   Alcohol use: Yes    Alcohol/week: 1.0 standard drink of alcohol    Types: 1 Glasses of wine per week    Comment: socially   Drug use: No   Sexual activity: Not on file  Other Topics Concern   Not on file  Social History Narrative   Lives alone   Caffeine use: 4 drinks coffee per day   Right-handed   Social Determinants of Health   Financial Resource Strain: Not on file  Food Insecurity: Not on file  Transportation Needs: Not on file  Physical Activity: Not on file  Stress: Not on file  Social Connections: Not on file  Intimate Partner Violence: Not on file    Family History  Problem Relation Age of Onset   Diabetes Mother    Diabetes Son     ROS: no fevers or chills, productive cough, hemoptysis, dysphasia, odynophagia, melena, hematochezia, dysuria, hematuria, rash, seizure activity, orthopnea, PND, pedal edema, claudication. Remaining systems are negative.  Physical  Exam: Well-developed well-nourished in no acute distress.  Skin is warm and dry.  HEENT is normal.  Neck is supple.  Chest is clear to auscultation with normal expansion.  Cardiovascular exam is irregular Abdominal exam nontender or distended. No masses palpated. Extremities show no edema. neuro grossly intact  ECG- 08/09/22 Atrial fibrillation with PVCs or aberrantly conducted beats. personally reviewed  A/P  1 paroxysmal atrial fibrillation-patient back in atrial fibrillation and is symptomatic. Begin amiodarone 200 mg BID for 1 week then 200 mg daily thereafter. Continue toprol and apixaban. Return in 4 weeks and if atrial fibrillation persists, schedule DCCV.   2 history of SVT ablation   3  coronary calcification-patient denies chest pain.  Recent nuclear study showed no ischemia.  Plan medical therapy.  Continue statin.   4 history of chronic dyspnea-etiology unclear.  Nuclear study showed no ischemia and echocardiogram showed preserved LV function.  Question contribution from scoliosis/restrictive lung disease. Worse due to atrial fibrillation.   5 chronic diastolic congestive heart failure-patient is euvolemic on examination.  Will continue to follow.   6 hyperlipidemia-continue Lipitor.  Olga Millers, MD

## 2022-08-10 NOTE — Patient Instructions (Signed)
Medication Instructions:   START AMIODARONE 200 MG ONE TABLET TWICE DAILY X 7 DAYS THEN TAKE 1 TABLET ONCE DAILY  *If you need a refill on your cardiac medications before your next appointment, please call your pharmacy*   Follow-Up: At [facility], you and your health needs are our priority.  As part of our continuing mission to provide you with exceptional heart care, we have created designated Provider Care Teams.  These Care Teams include your primary Cardiologist (physician) and Advanced Practice Providers (APPs -  Physician Assistants and Nurse Practitioners) who all work together to provide you with the care you need, when you need it.  We recommend signing up for the patient portal called "MyChart".  Sign up information is provided on this After Visit Summary.  MyChart is used to connect with patients for Virtual Visits (Telemedicine).  Patients are able to view lab/test results, encounter notes, upcoming appointments, etc.  Non-urgent messages can be sent to your provider as well.   To learn more about what you can do with MyChart, go to [email or web address].    Your next appointment:   4 week(s)  Provider:   ANY APP

## 2022-08-14 ENCOUNTER — Telehealth: Payer: Self-pay | Admitting: Cardiology

## 2022-08-14 NOTE — Telephone Encounter (Signed)
  Pt c/o medication issue:  1. Name of Medication:   amiodarone (PACERONE) 200 MG tablet    amiodarone (PACERONE) 200 MG tablet    2. How are you currently taking this medication (dosage and times per day)?   1 tablet twice daily x 1 week    Take 1 tablet (200 mg total) by mouth daily.    3. Are you having a reaction (difficulty breathing--STAT)? No  4. What is your medication issue? Pt states that since starting medication at last visit both feet have become swollen. Pt seems to think that it may be due to the medication. Pt would like a callback regarding this matter. Please advise

## 2022-08-14 NOTE — Telephone Encounter (Signed)
Left message for the patient to call the clinic.

## 2022-08-15 NOTE — Telephone Encounter (Signed)
Patient returned RN's call and requested call back to cell# [phone] as she is out of town.

## 2022-08-16 NOTE — Telephone Encounter (Signed)
Left voicemail to return call to office.

## 2022-08-16 NOTE — Telephone Encounter (Signed)
Increased risk of QT prolongation with amiodarone plus escitalopram and trazodone.  Had borderline QT prolongation on last EKG. Recommend rechecking at next office visit on 6/24

## 2022-08-16 NOTE — Telephone Encounter (Signed)
Patient feels like since she has started amiodarone and she looked up the medication interaction and she states it interacts with her Lipitor and her tramadol. She would like to know is this a concern for her. She also states since the amiodarone she has had swelling in both her feet.   Patient denies any chest pain, shortness or breath, headache, dizziness, nausea or vomiting. No weight gain.   I did advise for patient to elevate feet as much as possible when not active, monitor her sodium intake and she can try compression stockings. Will send to provider for further recommendations.

## 2022-08-16 NOTE — Telephone Encounter (Signed)
Patient is returning LPN's call.   She request you call her on [phone] if unable to reach her on the number listed in contact info.   Please advise.

## 2022-08-17 NOTE — Telephone Encounter (Signed)
Patient's daughter called to ask about possible side effects. Advised that patient should continue meds as prescribed and we will recheck EKG in 2 weeks. Patient currently at [facility]. Advised if she feels abnormal to go to [facility] which is up the road. Patient currently typically feels palpitations and SOB normally so may not realize any difference.

## 2022-08-18 DIAGNOSIS — R600 Localized edema: Secondary | ICD-10-CM | POA: Diagnosis not present

## 2022-08-18 DIAGNOSIS — I4891 Unspecified atrial fibrillation: Secondary | ICD-10-CM | POA: Diagnosis not present

## 2022-08-18 DIAGNOSIS — L239 Allergic contact dermatitis, unspecified cause: Secondary | ICD-10-CM | POA: Diagnosis not present

## 2022-08-25 NOTE — Progress Notes (Signed)
Patient received alcohol and drug screen as part of the trauma workup.  Patient was reported as a fall/trauma and these labs were performed to evaluate for any intoxication.  This lab test helps distinguish between intoxication versus medical related altered mental status.

## 2022-08-27 ENCOUNTER — Telehealth: Payer: Self-pay | Admitting: Cardiology

## 2022-08-27 DIAGNOSIS — R609 Edema, unspecified: Secondary | ICD-10-CM

## 2022-08-27 MED ORDER — FUROSEMIDE 20 MG PO TABS
20.0000 mg | ORAL_TABLET | Freq: Every day | ORAL | 3 refills | Status: DC | PRN
Start: 2022-08-27 — End: 2023-06-26

## 2022-08-27 NOTE — Telephone Encounter (Signed)
Spoke with pt, Aware of dr crenshaw's recommendations.  °

## 2022-08-27 NOTE — Telephone Encounter (Signed)
Patient states starting the medication on Friday, foot started swelling day after taking the medication. She states the rash started the following Wednesday or Thursday and she went to ED for this.  Rash has subsided and advised the medication may not have been reason for rash.  She [place] the beach on [place] for 2 weeks.Swelling continued to increase. States both feet and ankles are still swollen upon her return. She stayed will hydrated no increase sodium intake while there. Stayed with feet propped up all day yesterday and still swollen. Per conversation pitting +1.States increased SOB since swelling of feet. Taking Amiodarone 200 mg  Once Daily as directed (after the 1 week of Twice a day).   Please advise

## 2022-08-27 NOTE — Telephone Encounter (Signed)
Spoke with pt, Aware of dr Ludwig Clarks recommendations. She reports her biggest concern is her heart rate. 1 week ago she changed to once daily amiodarone and this morning her heart rate per her [employer] Sim[employer]s mobile was 48 bpm. She reports being tired all the time and feels it is related to her heart rate being so low. Aware will resend a message to dr Jens Som

## 2022-08-27 NOTE — Telephone Encounter (Signed)
Pt c/o medication issue:  1. Name of Medication: amiodarone (PACERONE) 200 MG tablet  amiodarone (PACERONE) 200 MG tablet   2. How are you currently taking this medication (dosage and times per day)? 1 tablet twice daily x 1 week     Take 1 tablet (200 mg total) by mouth daily.    3. Are you having a reaction (difficulty breathing--STAT)? no  4. What is your medication issue? Patient states when she first started taking this medication she broke out in a rash, the rash has cleared now.  She states her heart beat is low and it's making her feel terrible. She also states her feet and ankles are swollen.

## 2022-09-01 NOTE — Progress Notes (Unsigned)
Cardiology Clinic Note   Date: 09/03/2022 ID: Ann Rodriguez, DOB Dec 05, 1928, MRN [id number]  Primary Cardiologist:  Olga Millers, MD  Patient Profile    Ann Rodriguez is a 89 y.o. female who presents to the clinic today for follow up.     Past medical history significant for: Coronary artery calcifications. CT chest 01/29/2021: Marked severity calcification of the aortic arch and descending thoracic aorta without evidence of aortic aneurysm or dissection.  Mild cardiomegaly with moderate severity coronary artery calcification. Nuclear stress test 04/10/2022: No ST deviation noted.  No evidence of ischemia/infarction.  Low risk nuclear study.  EF 36% with global hypokinesis.  (Follow-up echo showed normal LV function). PAF. 14-day ZIO 11/14/2021: Predominant underlying rhythm is normal sinus HR range 45 to 197 bpm, average HR 72 bpm.  8% A-fib/flutter burden, longest episode 32 minutes and 31 seconds, average HR 129 bpm.  NSVT 197 bpm versus a-flutter with aberration.  1% PVC burden. >9% PAC burden. Patient started on flecainide and Eliquis. Flecainide stopped secondary to history of coronary calcifications. Chronic diastolic heart failure. Echo 05/03/2022: EF 55 to 60%.  No RWMA.  Grade I DD.  Normal RV function.  Mild LAE.  Mild MR.  Mild AI.  Aortic valve sclerosis/calcification without stenosis. SVT. 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 7-day ZIO 04/04/2022: Predominant underlying rhythm is normal sinus, sinus bradycardia, tachycardia, frequent PVCs and PACs, frequent SVT (patient's symptomatic within +/- 45 seconds of SVT). Carotid stenosis. Carotid ultrasound 11/03/2014: 1 to 39% bilateral ICA. Near syncope. TIA. Asthma.     History of Present Illness    Ann Rodriguez is a longtime patient of cardiology.  She initially was followed by Dr. Katrinka Blazing until he retired and then transitioned to Dr. Jens Som on 05/24/2022.  She was in sinus rhythm at that time.  Future planning for amiodarone if she  develops more frequent episodes of A-fib.  Patient presented to the ED on 08/07/2022 with complaints of an episode of aphasia lasting 40 minutes 3 days prior.  No further episodes since.  EKG showed A-fib with PVCs, HR 116 bpm.  Patient was last seen in the office on 08/10/2022 by Dr. Jens Som with complaints of recurrent A-fib, increased fatigue and DOE.  Patient was started on amiodarone.  She was instructed to return in 4 weeks.  If A-fib persistent she will be scheduled for DCCV.  Patient contacted the office on 08/14/2022 with complaints of edema in bilateral feet since starting amiodarone.  Information from triage "Patient feels like since she has started amiodarone and she looked up the medication interaction and she states it interacts with her Lipitor and her tramadol. She would like to know is this a concern for her. She also states since the amiodarone she has had swelling in both her feet. Patient denies any chest pain, shortness or breath, headache, dizziness, nausea or vomiting. No weight gain. I did advise for patient to elevate feet as much as possible when not active, monitor her sodium intake and she can try compression stockings. Will send to provider for further recommendations."  Per Pharm.D. there is an increased risk of QT prolongation with amiodarone plus escitalopram and trazodone.  Patient had been borderline QT prolongation on last EKG.  Dr. Jens Som recommended rechecking EKG at next office visit.  She was in [facility] at that time and was provided with a ED precautions.  Patient contacted the office again on 08/27/2022 with complaints of rash, feet and ankle edema, and low HR  since starting amiodarone.  Per conversation with triage "Patient states starting the medication on Friday, foot started swelling day after taking the medication. She states the rash started the following Wednesday or Thursday and she went to ED for this.  Rash has subsided and advised the medication may  not have been reason for rash. She went to the beach on Saturday for 2 weeks.Swelling continued to increase. States both feet and ankles are still swollen upon her return. She stayed will hydrated no increase sodium intake while there. Stayed with feet propped up all day yesterday and still swollen. Per conversation pitting +1.States increased SOB since swelling of feet. Taking Amiodarone 200 mg  Once Daily as directed (after the 1 week of Twice a day)."  Dr. Jens Som recommended Lasix 20 mg as needed.  Patient then spoke to RN and reported HR 48 bpm with increased fatigue.  Dr. Jens Som recommended stopping metoprolol and monitoring heart rate BP.  Today, patient is accompanied by her daughter. She is back in afib. She reports continued "breathlessness" with simple activities. She stopped metoprolol after calling in to report heart in the 40s (see above) and her heart rate improved but she went back into afib. Feet and ankle edema is improved but not completely resolved. She reports she experienced nausea and vomiting with first dose of Lasix and nausea the next two doses. That has since resolved, however, she did not have increased urination. She does not weigh daily. Weight in our system is stable.   Wt Readings from Last 3 Encounters:  09/03/22 136 lb 9.6 oz (62 kg)  08/10/22 137 lb (62.1 kg)  08/07/22 137 lb (62.1 kg)   She admits to eating a lot of fast food when she is at home. This past week while at the beach she was eating more healthy foods, as her son likes to cook.      ROS: All other systems reviewed and are otherwise negative except as noted in History of Present Illness.  Studies Reviewed    EKG Interpretation  Date/Time:  Monday September 03 2022 08:31:56 EDT Ventricular Rate:  73 PR Interval:    QRS Duration: 100 QT Interval:  408 QTC Calculation: 449 R Axis:   -16 Text Interpretation: Atrial fibrillation Incomplete right bundle branch block Nonspecific ST and T wave  abnormality When compared with ECG of 07-Aug-2022 15:57,Rate improved Confirmed by Carlos Levering [id number]) on 09/03/2022 8:46:55 AM    Risk Assessment/Calculations     CHA2DS2-VASc Score = 5   This indicates a 7.2% annual risk of stroke. The patient's score is based upon: CHF History: 1 HTN History: 1 Diabetes History: 0 Stroke History: 0 Vascular Disease History: 0 Age Score: 2 Gender Score: 1             Physical Exam    VS:  BP 100/62   Pulse 73   Ht 5\' 5"  (1.651 m)   Wt 136 lb 9.6 oz (62 kg)   SpO2 97%   BMI 22.73 kg/m  , BMI Body mass index is 22.73 kg/m.  GEN: Well nourished, well developed, in no acute distress. Neck: No JVD or carotid bruits. Cardiac: Irregular rhythm, controlled rate. No murmurs. No rubs or gallops.   Respiratory:  Respirations regular and unlabored. Clear to auscultation without rales, wheezing or rhonchi. GI: Soft, nontender, nondistended. Extremities: Radials/DP/PT 2+ and equal bilaterally. No clubbing or cyanosis. Mild, nonpitting edema bilateral feet L>R.  Skin: Warm and dry, no rash. Neuro: Strength intact.  Assessment & Plan    PAF.  14-day ZIO September 2023 showed 8% A-fib/flutter burden.  Patient was started on Eliquis and flecainide, however flecainide was discontinued secondary to coronary calcification seen on CT.  In May 2024 patient reported increased episodes of symptomatic A-fib per her checks on [employer] mobile device.  She was started on amiodarone.  Contacted the office several times with complaints of reaction to amiodarone.  There is a concern of interaction between amiodarone and escitalopram and trazodone secondary to QT prolongation.  Patient also reported low heart rate.  Metoprolol was stopped on 08/27/2022.  Patient reports improved heart rate off metoprolol but now back in afib. She reports breathlessness with simple activities.She denies spontaneous bleeding concerns. She has not missed any doses of Eliquis.  EKG shows  afib, HR 73 bpm. Continue amiodarone, Eliquis. Appropriate Eliquis dose. Discussed cardioversion as per her last visit with Dr. Jens Som. She would like to proceed.  Chronic diastolic heart failure/Lower extremity edema.  Echo February 2024 showed normal LV/RV function, grade 1 DD, mild LAE, mild MR/AI. Edema of bilateral feet and ankles improved after taking Lasix. She experienced nausea/vomiting with first couple of doses of Lasix but that has improved. She did not have increased urination on Lasix. She does not weigh daily but weight in system is stable. She admits to sitting in a dependent position much of the time. She has mild nonpitting edema bilateral feet L>R but otherwise euvolemic and well compensated on exam. She is instructed to stop daily dose of Lasix and take as needed. Discussed compression and elevation to help manage edema. Instructed to weigh daily and take a dose of Lasix for weight gain of 3 lb overnight or 5 lb in a week.  Coronary artery calcifications.  Moderate coronary artery calcification seen on CT November 2022.  Nuclear stress test January 2024 was a normal, low risk study.  It was noted she had EF of 36% with follow-up echo showed normal LV function.  Patient denies chest pain, tightness, pressure. Aspirin stopped secondary to Eliquis.    Disposition: Cardioversion. CBC and BMP today. Return in 2-3 weeks or sooner as needed.      Informed Consent   Shared Decision Making/Informed Consent The risks (stroke, cardiac arrhythmias rarely resulting in the need for a temporary or permanent pacemaker, skin irritation or burns and complications associated with conscious sedation including aspiration, arrhythmia, respiratory failure and death), benefits (restoration of normal sinus rhythm) and alternatives of a direct current cardioversion were explained in detail to Ms. Corporan and she agrees to proceed.        Signed, Etta Grandchild. Adin Laker, DNP, NP-C

## 2022-09-03 ENCOUNTER — Ambulatory Visit: Payer: Medicare Other | Attending: Student | Admitting: Student

## 2022-09-03 ENCOUNTER — Encounter: Payer: Self-pay | Admitting: Student

## 2022-09-03 VITALS — BP 100/62 | HR 73 | Ht 65.0 in | Wt 136.6 lb

## 2022-09-03 DIAGNOSIS — Z79899 Other long term (current) drug therapy: Secondary | ICD-10-CM | POA: Diagnosis not present

## 2022-09-03 DIAGNOSIS — I2510 Atherosclerotic heart disease of native coronary artery without angina pectoris: Secondary | ICD-10-CM

## 2022-09-03 DIAGNOSIS — I480 Paroxysmal atrial fibrillation: Secondary | ICD-10-CM

## 2022-09-03 DIAGNOSIS — I471 Supraventricular tachycardia, unspecified: Secondary | ICD-10-CM | POA: Diagnosis not present

## 2022-09-03 DIAGNOSIS — I5032 Chronic diastolic (congestive) heart failure: Secondary | ICD-10-CM | POA: Diagnosis not present

## 2022-09-03 DIAGNOSIS — R600 Localized edema: Secondary | ICD-10-CM

## 2022-09-03 MED ORDER — APIXABAN 5 MG PO TABS: 5.0000 mg | ORAL_TABLET | Freq: Two times a day (BID) | ORAL | 0 refills | Status: DC

## 2022-09-03 NOTE — Patient Instructions (Addendum)
Medication Instructions:  Please take Lasix 20 mg on the daily of weight gain 3 lb overnight or 5 lb in 1 week.  *If you need a refill on your cardiac medications before your next appointment, please call your pharmacy*   Lab Work: BMET & CBC today  Testing/Procedures: Your physician has requested that you have a Cardioversion. During a cardioversion, sound waves are used to create images of your heart. It provides your doctor with information about the size and shape of your heart and how well your heart's chambers and valves are working. In this test, a transducer is attached to the end of a flexible tube that is guided down you throat and into your esophagus (the tube leading from your mouth to your stomach) to get a more detailed image of your heart. Once the TEE has determined that a blood clot is not present, the cardioversion begins. Electrical Cardioversion uses a jolt of electricity to your heart either through paddles or wired patches attached to your chest. This is a controlled, usually prescheduled, procedure. This procedure is done at the hospital and you are not awake during the procedure. You usually go home the day of the procedure. Please see the instruction sheet given to you today for more information.       Follow-Up: At [facility], you and your health needs are our priority.  As part of our continuing mission to provide you with exceptional heart care, we have created designated Provider Care Teams.  These Care Teams include your primary Cardiologist (physician) and Advanced Practice Providers (APPs -  Physician Assistants and Nurse Practitioners) who all work together to provide you with the care you need, when you need it.  We recommend signing up for the patient portal called "MyChart".  Sign up information is provided on this After Visit Summary.  MyChart is used to connect with patients for Virtual Visits (Telemedicine).  Patients are able to view lab/test  results, encounter notes, upcoming appointments, etc.  Non-urgent messages can be sent to your provider as well.   To learn more about what you can do with MyChart, go to [email or web address].    Your next appointment:   2-4 week(s) post cardioversion   Provider:   Any app    Other Instructions     Dear LAKETRA BOWDISH  You are scheduled for a Cardioversion on Friday, June 28 with Dr. Shari Prows.  Please arrive at the [facility] (Main Entrance A) at [facility]: [address] [place], [place] [address] at 6:30 AM (This time is 1 hour(s) before your procedure to ensure your preparation). Free valet parking service is available. You will check in at ADMITTING. The support person will be asked to wait in the waiting room.  It is OK to have someone drop you off and come back when you are ready to be discharged.      DIET:  Nothing to eat or drink after midnight except a sip of water with medications (see medication instructions below)  MEDICATION INSTRUCTIONS: !!IF ANY NEW MEDICATIONS ARE STARTED AFTER TODAY, PLEASE NOTIFY YOUR PROVIDER AS SOON AS POSSIBLE!!  FYI: Medications such as Semaglutide (Ozempic, [place]), Tirzepatide (Mounjaro, Zepbound), Dulaglutide (Trulicity), etc ("GLP1 agonists") AND Canagliflozin (Invokana), Dapagliflozin (Farxiga), Empagliflozin (Jardiance), Ertugliflozin (Steglatro), Bexagliflozin [employer]) or any combination with one of these drugs such as Invokamet (Canagliflozin/Metformin), Synjardy (Empagliflozin/Metformin), etc ("SGLT2 inhibitors") must be held around the time of a procedure. This is not a comprehensive list of all of  these drugs. Please review all of your medications and talk to your provider if you take any one of these. If you are not sure, ask your provider.     Continue taking your anticoagulant (blood thinner): Apixaban (Eliquis).  You will need to continue this after your procedure until you are told by your provider that it is safe to  stop.    LABS:   Come to the lab at [facility] at [address] between the hours of 8:00 am and 4:30 pm. You do NOT have to be fasting.  FYI:  For your safety, and to allow [place] to monitor your vital signs accurately during the surgery/procedure we request: If you have artificial nails, gel coating, SNS etc, please have those removed prior to your surgery/procedure. Not having the nail coverings /polish removed may result in cancellation or delay of your surgery/procedure.  You must have a responsible person to drive you home and stay in the waiting area during your procedure. Failure to do so could result in cancellation.  Bring your insurance cards.  *Special Note: Every effort is made to have your procedure done on time. Occasionally there are emergencies that occur at the hospital that may cause delays. Please be patient if a delay does occur.    Daily Weight Record  It is important to weigh yourself daily. To do this: Make sure you use a reliable scale. Use the same scale each day. Keep this daily weight chart near your scale. Weigh yourself each morning at the same time after you use the bathroom. Before weighing yourself: Take off your shoes. Make sure you are wearing the same amount of clothing each day. Write down your weight in the spaces on the form. Compare today's weight to yesterday's weight. Bring this form with you to your follow-up visits with your health care provider. Call your health care provider if you have concerns about your weight, including rapid weight gain or loss. Date: ________ Weight: ____________________ Date: ________ Weight: ____________________ Date: ________ Weight: ____________________ Date: ________ Weight: ____________________ Date: ________ Weight: ____________________ Date: ________ Weight: ____________________ Date: ________ Weight: ____________________ Date: ________ Weight: ____________________ Date: ________ Weight:  ____________________ Date: ________ Weight: ____________________ Date: ________ Weight: ____________________ Date: ________ Weight: ____________________ Date: ________ Weight: ____________________ Date: ________ Weight: ____________________ Date: ________ Weight: ____________________ Date: ________ Weight: ____________________ Date: ________ Weight: ____________________ Date: ________ Weight: ____________________ Date: ________ Weight: ____________________ Date: ________ Weight: ____________________ Date: ________ Weight: ____________________ Date: ________ Weight: ____________________ Date: ________ Weight: ____________________ Date: ________ Weight: ____________________ Date: ________ Weight: ____________________ Date: ________ Weight: ____________________ Date: ________ Weight: ____________________ Date: ________ Weight: ____________________ Date: ________ Weight: ____________________ Date: ________ Weight: ____________________ Date: ________ Weight: ____________________ Date: ________ Weight: ____________________ Date: ________ Weight: ____________________ Date: ________ Weight: ____________________ Date: ________ Weight: ____________________ Date: ________ Weight: ____________________ Date: ________ Weight: ____________________ Date: ________ Weight: ____________________ Date: ________ Weight: ____________________ Date: ________ Weight: ____________________ Date: ________ Weight: ____________________ Date: ________ Weight: ____________________ Date: ________ Weight: ____________________ Date: ________ Weight: ____________________ Date: ________ Weight: ____________________ Date: ________ Weight: ____________________ Date: ________ Weight: ____________________ Date: ________ Weight: ____________________ Date: ________ Weight: ____________________ Date: ________ Weight: ____________________ This information is not intended to replace advice given to you by your health care provider. Make sure  you discuss any questions you have with your health care provider. Document Revised: 11/01/2020 Document Reviewed: 11/01/2020 Elsevier Patient Education  2024 [employer].  Pt Assistance form given for [employer]

## 2022-09-04 LAB — CBC
Hematocrit: 38.9 % (ref 34.0–46.6)
Hemoglobin: 12.7 g/dL (ref 11.1–15.9)
MCH: 30.8 pg (ref 26.6–33.0)
MCHC: 32.6 g/dL (ref 31.5–35.7)
MCV: 94 fL (ref 79–97)
Platelets: 287 10*3/uL (ref 150–450)
RBC: 4.12 x10E6/uL (ref 3.77–5.28)
RDW: 13.2 % (ref 11.7–15.4)
WBC: 7.2 10*3/uL (ref 3.4–10.8)

## 2022-09-04 LAB — BASIC METABOLIC PANEL
BUN/Creatinine Ratio: 18 (ref 12–28)
BUN: 13 mg/dL (ref 10–36)
CO2: 27 mmol/L (ref 20–29)
Calcium: 9.8 mg/dL (ref 8.7–10.3)
Chloride: 103 mmol/L (ref 96–106)
Creatinine, Ser: 0.71 mg/dL (ref 0.57–1.00)
Glucose: 98 mg/dL (ref 70–99)
Potassium: 4.7 mmol/L (ref 3.5–5.2)
Sodium: 144 mmol/L (ref 134–144)
eGFR: 80 mL/min/{1.73_m2} (ref >59)

## 2022-09-06 ENCOUNTER — Encounter (HOSPITAL_COMMUNITY): Payer: Self-pay | Admitting: Registered Nurse

## 2022-09-06 NOTE — Progress Notes (Signed)
Spoke with  patient, Procedure scheduled for 0730 , Please arrive at the hospital at 0615, NPO after midnight on Thursday, May take meds with sips of water in the AM, please have transportation for home post procedure, and someone to stay with pt for approximately 24 hours after  Pt states she has been taking Eliquis and has not missed any doses  Questions answered concerning nail and toe polish

## 2022-09-07 ENCOUNTER — Telehealth: Payer: Self-pay | Admitting: Cardiology

## 2022-09-07 ENCOUNTER — Encounter (HOSPITAL_COMMUNITY): Payer: Self-pay | Admitting: Cardiology

## 2022-09-07 ENCOUNTER — Ambulatory Visit (HOSPITAL_BASED_OUTPATIENT_CLINIC_OR_DEPARTMENT_OTHER): Payer: Medicare Other | Admitting: Pulmonary Disease

## 2022-09-07 ENCOUNTER — Other Ambulatory Visit: Payer: Self-pay

## 2022-09-07 ENCOUNTER — Encounter (HOSPITAL_COMMUNITY)
Admission: RE | Disposition: A | Discharge status: Home / Self Care | Payer: Self-pay | Source: Home / Self Care | Attending: Cardiology

## 2022-09-07 ENCOUNTER — Ambulatory Visit (HOSPITAL_COMMUNITY)
Admission: RE | Admit: 2022-09-07 | Discharge: 2022-09-07 | Disposition: A | Discharge status: Home / Self Care | Payer: Medicare Other | Attending: Cardiology | Admitting: Cardiology

## 2022-09-07 DIAGNOSIS — Z539 Procedure and treatment not carried out, unspecified reason: Secondary | ICD-10-CM | POA: Diagnosis not present

## 2022-09-07 DIAGNOSIS — I4891 Unspecified atrial fibrillation: Secondary | ICD-10-CM | POA: Insufficient documentation

## 2022-09-07 DIAGNOSIS — I4819 Other persistent atrial fibrillation: Secondary | ICD-10-CM

## 2022-09-07 SURGERY — INVASIVE LAB ABORTED CASE
Anesthesia: Monitor Anesthesia Care

## 2022-09-07 MED ORDER — SODIUM CHLORIDE 0.9 % IV SOLN: INTRAVENOUS | Status: DC

## 2022-09-07 SURGICAL SUPPLY — 1 items: ELECT DEFIB PAD ADLT CADENCE (PAD) ×1 IMPLANT

## 2022-09-07 NOTE — Progress Notes (Signed)
Patient arrived in NSR. Procedure cancelled and patient discharged home.  Laurance Flatten, MD

## 2022-09-07 NOTE — Telephone Encounter (Signed)
Pt called in asking what is the POC  now since she went in for her procedure this morning and they didn't proceed because pt was back in sinus rhythm. Please advise.

## 2022-09-07 NOTE — Progress Notes (Signed)
Patient arrived she is in sinus rhythm.  EKG obtained.  Dr Shari Prows reviewed EKG.  Procedure canceled, patient in rhythm.

## 2022-09-07 NOTE — Telephone Encounter (Signed)
Spoke with pt, Aware of dr crenshaw's recommendations.  °

## 2022-09-07 NOTE — Telephone Encounter (Signed)
Patient did not have cardioversion as was in NSR at time of procedure.  She ask what she needs to do going forward.

## 2022-09-10 DIAGNOSIS — H353132 Nonexudative age-related macular degeneration, bilateral, intermediate dry stage: Secondary | ICD-10-CM | POA: Diagnosis not present

## 2022-09-12 DIAGNOSIS — H31091 Other chorioretinal scars, right eye: Secondary | ICD-10-CM | POA: Diagnosis not present

## 2022-09-12 DIAGNOSIS — H43813 Vitreous degeneration, bilateral: Secondary | ICD-10-CM | POA: Diagnosis not present

## 2022-09-12 DIAGNOSIS — H353133 Nonexudative age-related macular degeneration, bilateral, advanced atrophic without subfoveal involvement: Secondary | ICD-10-CM | POA: Diagnosis not present

## 2022-09-20 ENCOUNTER — Encounter (HOSPITAL_BASED_OUTPATIENT_CLINIC_OR_DEPARTMENT_OTHER): Payer: Self-pay | Admitting: Pulmonary Disease

## 2022-09-20 ENCOUNTER — Ambulatory Visit (HOSPITAL_BASED_OUTPATIENT_CLINIC_OR_DEPARTMENT_OTHER): Payer: Medicare Other | Admitting: Pulmonary Disease

## 2022-09-20 VITALS — BP 136/54 | HR 65 | Temp 98.1°F | Ht 65.0 in | Wt 135.4 lb

## 2022-09-20 DIAGNOSIS — J4550 Severe persistent asthma, uncomplicated: Secondary | ICD-10-CM | POA: Diagnosis not present

## 2022-09-20 NOTE — Patient Instructions (Signed)
  Mild Persistent Asthma - improved --HOLD Breztri for now. Monitor symptoms. --CONTINUE Albuterol TWO puffs ONCE a day. This is your as needed --CONTINUE regular exercise. Encourage 10,000 steps a day. Continue upper body exercises with weights.

## 2022-09-20 NOTE — Progress Notes (Signed)
Subjective:   PATIENT ID: Ann Rodriguez GENDER: female DOB: 1929-03-04, MRN: [id number]   HPI  Chief Complaint  Patient presents with   Follow-up    Follow up. Patient has no complaints.     Reason for Visit: Follow-up   Ann Rodriguez is a 89 year old female with kyphoscoliosis, atrial fibrillation, SVT status post ablation 10-05-04, chronic diastolic heart failure, hx TIA, HLD, prior sinus surgery who presents for follow-up  Synopsis: Initially presented for longstanding shortness of breath for over a year. Denies associated chest pain, cough, wheezing or sputum production. Reports her symptoms have limited her ability to clean the house like she used to. PCP and Cardiology work-up has revealed chronic diastolic heart failure otherwise negative for etiology.   04/2020 Since her last visit, her symptoms of dyspnea have remained unchanged.  Occurs with exertion.  Has limited her activities.  She was previously very active participating in water aerobics.  During the pandemic she has not been able to engage as often as she would like.  She did have a recent fall while doing yoga and has modified her movements by using a chair.  Does report benefit after starting this activity.  Denies any associated wheezing.  Does have a dry cough.  Symptoms do not seem different from day or night.  02/19/22 Last visit was >1 year ago. Since our last visit she reports shortness of breath including with simple activities including while combing her hair. Denies any at rest but with any exertion including. Denies cough and wheezing. Has been on Breo for one month but not improved and albuterol. Uses albuterol 3-4 times a day. Taking mucinex for congestion.  04/12/22 Since our last visit Trelegy was tried but she continued to have shortness of breath due to inability to take full dose of inhaler. Continues to have shortness of breath with walking and using her arms for tasks like combing her hair. She recently  had a stress test this week that was low risk but was concerning for new low EF. Plans for repeat echocardiogram. She has been doing chair exercises including upper body exercises.  06/07/22 She was changed from Trelegy to [employer]. She is able to tolerate medication but has unchnaged shortness of breath but feels she is having more sputum production compared to before. Occasional wheezing. Started chair yoga 2-3 times a week. She will get winded on exertion.  09/20/22 Since our last visit she has had an ED visit for possible TIA. Some shortness of breath with activity including short distances. But atrial fib has been better controlled recently on amiodarone. Has not been on her Markus Daft consistently this summer and feels like there is not difference since her heart rate has improved. Denies cough and wheezing. [facility].  Social History: Significant exposure from her spouse. He passed with COPD Social smoker. Quit in 10-06-58. Possibly 3 pack-years at most   Past Medical History:  Diagnosis Date   Arthritis    Bilateral foot-drop 08/31/2020   Depression    PT'S HUSBAND AND SON HAVE DIED W/IN PAST YEAR 10-06-11   GERD (gastroesophageal reflux disease)    Hearing loss    Hyperlipidemia    Idiopathic peripheral neuropathy 07/18/2016   Nocturnal leg cramps 01/22/2017   Stroke [facility])    SVT (supraventricular tachycardia) Oct 06, 2014   Thyroid disease    No Known Allergies   Outpatient Medications Prior to Visit  Medication Sig Dispense Refill   acetaminophen (  TYLENOL) 500 MG tablet Take 2 tablets (1,000 mg total) by mouth every 6 (six) hours as needed for mild pain or fever.     albuterol (VENTOLIN HFA) 108 (90 Base) MCG/ACT inhaler Inhale 1 puff into the lungs every 4 (four) hours as needed for shortness of breath or wheezing.     amiodarone (PACERONE) 200 MG tablet Take 1 tablet (200 mg total) by mouth daily. 90 tablet 3   apixaban (ELIQUIS) 5 MG TABS tablet Take 1 tablet  (5 mg total) by mouth 2 (two) times daily. 60 tablet 11   atorvastatin (LIPITOR) 40 MG tablet Take 40 mg by mouth daily.     BIOTIN PO Take 1 tablet by mouth daily.     Budeson-Glycopyrrol-Formoterol (BREZTRI AEROSPHERE) 160-9-4.8 MCG/ACT AERO Inhale 2 puffs into the lungs 2 (two) times daily. 10.7 g 5   cholecalciferol (VITAMIN D) 1000 UNITS tablet Take 1,000 Units by mouth daily.     escitalopram (LEXAPRO) 10 MG tablet Take 10 mg by mouth daily.  0   furosemide (LASIX) 20 MG tablet Take 1 tablet (20 mg total) by mouth daily as needed. 90 tablet 3   HYDROcodone-acetaminophen (NORCO/VICODIN) 5-325 MG tablet Take 1 tablet by mouth every 6 (six) hours as needed for severe pain. Every 6-8 hours     Multiple Vitamins-Minerals (PRESERVISION AREDS 2 PO) Take 1 capsule by mouth in the morning and at bedtime.     polyethylene glycol (MIRALAX / GLYCOLAX) packet Take 17 g by mouth daily.     traMADol (ULTRAM) 50 MG tablet Take 50 mg by mouth every 12 (twelve) hours as needed for moderate pain.     traZODone (DESYREL) 50 MG tablet Take 75 mg by mouth at bedtime as needed for sleep.     vitamin B-12 (CYANOCOBALAMIN) 100 MCG tablet Take 100 mcg by mouth daily.     No facility-administered medications prior to visit.    Review of Systems  Constitutional:  Negative for chills, diaphoresis, fever, malaise/fatigue and weight loss.  HENT:  Negative for congestion.   Respiratory:  Positive for shortness of breath. Negative for cough, hemoptysis, sputum production and wheezing.   Cardiovascular:  Negative for chest pain, palpitations and leg swelling.     Objective:   Vitals:   09/20/22 1023  BP: (!) 136/54  Pulse: 65  Temp: 98.1 F (36.7 C)  TempSrc: Oral  SpO2: 96%  Weight: 135 lb 6.4 oz (61.4 kg)  Height: 5\' 5"  (1.651 m)   SpO2: 96 % O2 Device: None (Room air)  Physical Exam: General: Elderly, well-appearing, no acute distress HENT: [place], AT Eyes: EOMI, no scleral icterus Respiratory: Clear  to auscultation bilaterally.  No crackles, wheezing or rales Cardiovascular: RRR, -M/R/G, no JVD Extremities:-Edema,-tenderness Neuro: AAO x4, CNII-XII grossly intact Psych: Normal mood, normal affect  Data Reviewed:  Imaging: CTA 02/18/13 - No PE. Background emphysema CXR Ribs 07/25/19 Remote left rib fractures 5-7. Subacute left 10th rib fracture.  CT Chest 02/06/21 - mild scarring in RUL and bilateral lower lobes CXR 10/18/21 - Cardiomegaly. No acute infiltrate, effusion or edema CXR 03/14/22 - Cardiomegaly. Kyphoscoliosis. No acute infiltrate, effusion or edema. CT Chest 07/17/22 - Lung parenchyma with scattered linear scarring.  PFT: 10/28/14 FVC 2.81 (109%) FEV1 2.2 (116%) Ratio 70  TLC 95% DLCO 75% Interpretation: Mild obstructive defect with mildly reduced DLCO  04/21/20 FVC 2.1 (90%) FEV1 1.69 (98%) Ratio 74  TLC 84% DLCO 78% Interpretation: No obstruction on spirometry however bronchodilator response significant in  FEV1. Normal lung volumes. Reduced gas exchange.  Labs: CBC    Component Value Date/Time   WBC 7.2 09/03/2022 0947   WBC 7.6 08/07/2022 1600   RBC 4.12 09/03/2022 0947   RBC 4.17 08/07/2022 1600   HGB 12.7 09/03/2022 0947   HCT 38.9 09/03/2022 0947   PLT 287 09/03/2022 0947   MCV 94 09/03/2022 0947   MCH 30.8 09/03/2022 0947   MCH 30.2 08/07/2022 1600   MCHC 32.6 09/03/2022 0947   MCHC 31.7 08/07/2022 1600   RDW 13.2 09/03/2022 0947   LYMPHSABS 1.4 08/07/2022 1600   MONOABS 0.8 08/07/2022 1600   EOSABS 0.1 08/07/2022 1600   BASOSABS 0.0 08/07/2022 1600      Assessment & Plan:   Discussion: 89 year old female with remote smoking history who presents for follow-up for asthma. Prior PFTs demonstrate obstructive defect and CTA in 2014 commenting on emphysema. Symptoms stable off inhalers. Dyspnea improved with HR control from atrial fib. Counseled on monitoring symptoms to restart based on seasonal changes.  Mild Persistent Asthma - improved --HOLD  Breztri for now. Monitor symptoms. --CONTINUE Albuterol TWO puffs ONCE a day. This is your as needed --CONTINUE regular exercise. Encourage 10,000 steps a day. Continue upper body exercises with weights.   Kyphoscoliosis Remote rib fractures Deconditioning --Supportive care --Encourage regular activity as tolerated.  OK to do yoga with chair  Health Maintenance Immunization History  Administered Date(s) Administered   Influenza Split 11/20/2007   Influenza, High Dose Seasonal PF 11/24/2009, 11/06/2010, 12/10/2011, 11/17/2012, 12/10/2013, 12/03/2014, 12/12/2015, 11/01/2016, 12/12/2017, 11/20/2018, 11/12/2019   PFIZER(Purple Top)SARS-COV-2 Vaccination 04/02/2019, 04/23/2019, 12/16/2019   Pneumococcal Conjugate-13 05/10/2014   Pneumococcal Polysaccharide-23 03/17/2009   CT Lung Screen - not indicated  No orders of the defined types were placed in this encounter.  No orders of the defined types were placed in this encounter.  Return in about 6 months (around 03/23/2023).  I have spent a total time of 25-minutes on the day of the appointment including chart review, data review, collecting history, coordinating care and discussing medical diagnosis and plan with the patient/family. Past medical history, allergies, medications were reviewed. Pertinent imaging, labs and tests included in this note have been reviewed and interpreted independently by me.  Kailey Esquilin Mechele Collin, MD [facility] 09/20/2022 2:22 PM  Office Number [phone]

## 2022-10-22 DIAGNOSIS — K080 Exfoliation of teeth due to systemic causes: Secondary | ICD-10-CM | POA: Diagnosis not present

## 2022-11-05 ENCOUNTER — Telehealth: Payer: Self-pay | Admitting: Cardiology

## 2022-11-05 NOTE — Telephone Encounter (Signed)
Patient states heart rate dropped last night to the mid 40's. This was when she went to bed.  This was not with Yvonne Kendall mobile but "counted it".  She states it causes "me to have not normal breathing but have to take several normal breaths and sometimes a long one".  Feels like it might get to a point she can't breath but has not yet. Only episode.  This morning HR with Kardia mobile is 63. States her breathing now is better, but still taking deep breaths now and then.   Advised if goes low today, then to get up and move around to increase HR.  Also if feels low, to check the Kardia mobile.  Will send to provider for recommendations

## 2022-11-05 NOTE — Telephone Encounter (Signed)
STAT if HR is under 50 or over 120 (normal HR is 60-100 beats per minute)  What is your heart rate?  46  Do you have a log of your heart rate readings (document readings)?   No  Do you have any other symptoms?   SOB  Patient stated her breathing has been labored due to her lower heart rate.  Patient stated episode started last night.

## 2022-11-08 DIAGNOSIS — M79642 Pain in left hand: Secondary | ICD-10-CM | POA: Diagnosis not present

## 2022-11-08 DIAGNOSIS — M13832 Other specified arthritis, left wrist: Secondary | ICD-10-CM | POA: Diagnosis not present

## 2022-11-08 DIAGNOSIS — G5602 Carpal tunnel syndrome, left upper limb: Secondary | ICD-10-CM | POA: Diagnosis not present

## 2022-11-08 DIAGNOSIS — M13842 Other specified arthritis, left hand: Secondary | ICD-10-CM | POA: Diagnosis not present

## 2022-11-13 ENCOUNTER — Other Ambulatory Visit: Payer: Self-pay

## 2022-11-13 DIAGNOSIS — I4891 Unspecified atrial fibrillation: Secondary | ICD-10-CM

## 2022-11-13 MED ORDER — APIXABAN 5 MG PO TABS
5.0000 mg | ORAL_TABLET | Freq: Two times a day (BID) | ORAL | 11 refills | Status: DC
Start: 2022-11-13 — End: 2022-11-26

## 2022-11-13 NOTE — Telephone Encounter (Signed)
Continue to monitor HR Ann Rodriguez

## 2022-11-13 NOTE — Telephone Encounter (Signed)
Prescription refill request for Eliquis received. Indication: Afib  Last office visit: 09/03/22 Ann Rodriguez)  Scr: 0.71 (09/03/22)  Age: 89 Weight: 61.4kg  Appropriate dose. Refill sent.

## 2022-11-13 NOTE — Telephone Encounter (Signed)
Patient aware. Patient has an appt on 11/26/22

## 2022-11-19 NOTE — Progress Notes (Signed)
HPI: Follow-up atrial fibrillation and chronic diastolic congestive heart failure. Patient previously followed by Dr. Katrinka Blazing. Patient had ablation of AVNRT August 2016. Carotid Dopplers August 2016 showed 1 to 39% bilateral stenosis. Chest CT November 2022 showed coronary calcification. ABIs June 2023 normal bilaterally. Monitor August 2023 showed atrial fibrillation/flutter 8% burden and she was placed on flecainide and apixaban. Nuclear study January 2024 showed no ischemia or infarction and gated ejection fraction 36% but visually appeared better. Monitor January 2024 showed sinus rhythm with occasional PAC, PVC, brief PAT and 3 beats nonsustained ventricular tachycardia. Echocardiogram February 2024 showed normal LV function, grade 1 diastolic dysfunction, mild left atrial enlargement, mild mitral regurgitation, mild aortic insufficiency. Flecainide was ultimately discontinued due to coronary calcification.  CTA May 2024 showed no hemodynamically significant stenosis of the head and neck vasculature.  Patient ultimately placed on amiodarone for her atrial fibrillation.  She was seen with transient aphasia in the emergency room and CTA as described above.  She had transient bradycardia after amiodarone initiated and metoprolol was discontinued.  Since last seen she has some dyspnea on exertion but much improved compared to when she was in atrial fibrillation.  She denies chest pain, syncope or bleeding.  Current Outpatient Medications  Medication Sig Dispense Refill   acetaminophen (TYLENOL) 500 MG tablet Take 2 tablets (1,000 mg total) by mouth every 6 (six) hours as needed for mild pain or fever.     albuterol (VENTOLIN HFA) 108 (90 Base) MCG/ACT inhaler Inhale 1 puff into the lungs every 4 (four) hours as needed for shortness of breath or wheezing.     amiodarone (PACERONE) 200 MG tablet Take 1 tablet (200 mg total) by mouth daily. 90 tablet 3   apixaban (ELIQUIS) 5 MG TABS tablet Take 1 tablet  (5 mg total) by mouth 2 (two) times daily. 60 tablet 11   atorvastatin (LIPITOR) 40 MG tablet Take 40 mg by mouth daily.     BIOTIN PO Take 1 tablet by mouth daily.     Budeson-Glycopyrrol-Formoterol (BREZTRI AEROSPHERE) 160-9-4.8 MCG/ACT AERO Inhale 2 puffs into the lungs 2 (two) times daily. 10.7 g 5   cholecalciferol (VITAMIN D) 1000 UNITS tablet Take 1,000 Units by mouth daily.     escitalopram (LEXAPRO) 10 MG tablet Take 10 mg by mouth daily.  0   Multiple Vitamins-Minerals (PRESERVISION AREDS 2 PO) Take 1 capsule by mouth in the morning and at bedtime.     polyethylene glycol (MIRALAX / GLYCOLAX) packet Take 17 g by mouth daily.     traMADol (ULTRAM) 50 MG tablet Take 50 mg by mouth every 12 (twelve) hours as needed for moderate pain.     traZODone (DESYREL) 50 MG tablet Take 75 mg by mouth at bedtime as needed for sleep.     vitamin B-12 (CYANOCOBALAMIN) 100 MCG tablet Take 100 mcg by mouth daily.     furosemide (LASIX) 20 MG tablet Take 1 tablet (20 mg total) by mouth daily as needed. 90 tablet 3   HYDROcodone-acetaminophen (NORCO/VICODIN) 5-325 MG tablet Take 1 tablet by mouth every 6 (six) hours as needed for severe pain. Every 6-8 hours     No current facility-administered medications for this visit.     Past Medical History:  Diagnosis Date   Arthritis    Bilateral foot-drop 08/31/2020   Depression    PT'S HUSBAND AND SON HAVE DIED W/IN PAST YEAR 01-Jan-2012   GERD (gastroesophageal reflux disease)    Hearing loss  Hyperlipidemia    Idiopathic peripheral neuropathy 07/18/2016   Nocturnal leg cramps 01/22/2017   Stroke [facility])    SVT (supraventricular tachycardia) 09/2014   Thyroid disease     Past Surgical History:  Procedure Laterality Date   APPENDECTOMY     BACK SURGERY     lumbar   BLADDER SURGERY     BUNIONECTOMY     left and right with hammer toe repair   CARPAL TUNNEL RELEASE Bilateral 2017   ELECTROPHYSIOLOGIC STUDY N/A 11/03/2014   Procedure: SVT Ablation;   Surgeon: Marinus Maw, MD;  Location: [facility] INVASIVE CV LAB;  Service: Cardiovascular;  Laterality: N/A;   EYE SURGERY     bilateral cataract extraction with IOL   FOOT SURGERY     KNEE ARTHROSCOPY  02/09/2011   Procedure: ARTHROSCOPY KNEE;  Surgeon: Jacki Cones;  Location: [facility] ORS;  Service: Orthopedics;  Laterality: Left;  Left Knee Arthroscopy with Menisectomy medial, abrasion chondroplasty medial, and synovectomy, supra patella pouch.   NASAL SINUS SURGERY     OOPHORECTOMY     ROTATOR CUFF REPAIR  1/13,  3/13   x 3  right/   SALPINGECTOMY     SHOULDER OPEN ROTATOR CUFF REPAIR Left 06/26/2012   Procedure: LEFT SHOULDER ROTATOR CUFF REPAIR WITH ANCHORS ;  Surgeon: Jacki Cones, MD;  Location: [facility] ORS;  Service: Orthopedics;  Laterality: Left;   TOTAL KNEE ARTHROPLASTY  09/05/2011   Procedure: TOTAL KNEE ARTHROPLASTY;  Surgeon: Jacki Cones, MD;  Location: [facility] ORS;  Service: Orthopedics;  Laterality: Left;    Social History   Socioeconomic History   Marital status: Widowed    Spouse name: Not on file   Number of children: 3   Years of education: Not on file   Highest education level: Not on file  Occupational History   Occupation: Retired  Tobacco Use   Smoking status: Former    Current packs/day: 0.00    Average packs/day: 0.5 packs/day for 5.0 years (2.5 ttl pk-yrs)    Types: Cigarettes    Start date: 02/04/1954    Quit date: 02/05/1959    Years since quitting: 63.8   Smokeless tobacco: Never  Substance and Sexual Activity   Alcohol use: Yes    Alcohol/week: 1.0 standard drink of alcohol    Types: 1 Glasses of wine per week    Comment: socially   Drug use: No   Sexual activity: Not on file  Other Topics Concern   Not on file  Social History Narrative   Lives alone   Caffeine use: 4 drinks coffee per day   Right-handed   Social Determinants of Health   Financial Resource Strain: Not on file  Food Insecurity: Not on file  Transportation Needs: Not on file   Physical Activity: Not on file  Stress: Not on file  Social Connections: Not on file  Intimate Partner Violence: Not on file    Family History  Problem Relation Age of Onset   Diabetes Mother    Diabetes Son     ROS: no fevers or chills, productive cough, hemoptysis, dysphasia, odynophagia, melena, hematochezia, dysuria, hematuria, rash, seizure activity, orthopnea, PND, pedal edema, claudication. Remaining systems are negative.  Physical Exam: Well-developed well-nourished in no acute distress.  Skin is warm and dry.  HEENT is normal.  Neck is supple.  Chest is clear to auscultation with normal expansion.  Cardiovascular exam is regular rate and rhythm.  Abdominal exam nontender or distended. No masses palpated.  Extremities show no edema. neuro grossly intact  EKG Interpretation Date/Time:  Monday November 26 2022 08:01:09 EDT Ventricular Rate:  57 PR Interval:    QRS Duration:  98 QT Interval:  450 QTC Calculation: 438 R Axis:   -1  Text Interpretation: Marked sinus bradycardia Incomplete right bundle branch block Nonspecific ST abnormality When compared with ECG of 07-Sep-2022 06:33, Incomplete right bundle branch block is now Present Confirmed by Olga Millers ([id number]) on 11/26/2022 8:07:31 AM    A/P  1 paroxysmal atrial fibrillation-patient appears to be in sinus rhythm (P waves difficult to discern).  She is much improved compared to when she is in atrial fibrillation.  Will continue amiodarone and apixaban.  Check hemoglobin, renal function, liver functions, TSH and chest x-ray.  2 coronary calcification-patient denies chest pain.  Prior nuclear study showed no ischemia.  Continue statin.  No aspirin given need for anticoagulation.  3 history of SVT ablation  4 chronic diastolic congestive heart failure-she appears to be euvolemic on exam.  Will continue Lasix as needed.  5 hyperlipidemia-continue statin.  6 chronic dyspnea-etiology of dyspnea is unclear.   There was possible contribution from scoliosis/restrictive lung disease.  Note previous echocardiogram showed preserved LV function and no ischemia noted on nuclear study.  Olga Millers, MD

## 2022-11-26 ENCOUNTER — Ambulatory Visit
Admission: RE | Admit: 2022-11-26 | Discharge: 2022-11-26 | Disposition: A | Discharge status: Home / Self Care | Payer: Medicare Other | Source: Ambulatory Visit | Attending: Cardiology | Admitting: Cardiology

## 2022-11-26 ENCOUNTER — Encounter: Payer: Self-pay | Admitting: Cardiology

## 2022-11-26 ENCOUNTER — Ambulatory Visit: Payer: Medicare Other | Attending: Cardiology | Admitting: Cardiology

## 2022-11-26 VITALS — BP 114/58 | HR 57 | Ht 65.0 in | Wt 136.2 lb

## 2022-11-26 DIAGNOSIS — R0602 Shortness of breath: Secondary | ICD-10-CM | POA: Diagnosis not present

## 2022-11-26 DIAGNOSIS — I480 Paroxysmal atrial fibrillation: Secondary | ICD-10-CM

## 2022-11-26 DIAGNOSIS — I5032 Chronic diastolic (congestive) heart failure: Secondary | ICD-10-CM

## 2022-11-26 DIAGNOSIS — R0609 Other forms of dyspnea: Secondary | ICD-10-CM

## 2022-11-26 DIAGNOSIS — I471 Supraventricular tachycardia, unspecified: Secondary | ICD-10-CM | POA: Diagnosis not present

## 2022-11-26 DIAGNOSIS — I2510 Atherosclerotic heart disease of native coronary artery without angina pectoris: Secondary | ICD-10-CM | POA: Diagnosis not present

## 2022-11-26 DIAGNOSIS — I4891 Unspecified atrial fibrillation: Secondary | ICD-10-CM

## 2022-11-26 MED ORDER — APIXABAN 5 MG PO TABS
5.0000 mg | ORAL_TABLET | Freq: Two times a day (BID) | ORAL | 0 refills | Status: DC
Start: 2022-11-26 — End: 2023-03-29

## 2022-11-26 NOTE — Patient Instructions (Signed)
Testing/Procedures: A chest x-ray takes a picture of the organs and structures inside the chest, including the heart, lungs, and blood vessels. This test can show several things, including, whether the heart is enlarges; whether fluid is building up in the lungs; and whether pacemaker / defibrillator leads are still in place.  IMAGING - [address]   Follow-Up: At [facility], you and your health needs are our priority.  As part of our continuing mission to provide you with exceptional heart care, we have created designated Provider Care Teams.  These Care Teams include your primary Cardiologist (physician) and Advanced Practice Providers (APPs -  Physician Assistants and Nurse Practitioners) who all work together to provide you with the care you need, when you need it.  We recommend signing up for the patient portal called "MyChart".  Sign up information is provided on this After Visit Summary.  MyChart is used to connect with patients for Virtual Visits (Telemedicine).  Patients are able to view lab/test results, encounter notes, upcoming appointments, etc.  Non-urgent messages can be sent to your provider as well.   To learn more about what you can do with MyChart, go to [email or web address].    Your next appointment:   6 month(s)  Provider:   Olga Millers, MD

## 2022-11-27 LAB — LIPID PANEL
Chol/HDL Ratio: 2.5 ratio (ref 0.0–4.4)
Cholesterol, Total: 179 mg/dL (ref 100–199)
HDL: 72 mg/dL (ref >39)
LDL Chol Calc (NIH): 96 mg/dL (ref 0–99)
Triglycerides: 59 mg/dL (ref 0–149)
VLDL Cholesterol Cal: 11 mg/dL (ref 5–40)

## 2022-11-27 LAB — CBC
Hematocrit: 39.1 % (ref 34.0–46.6)
Hemoglobin: 12.6 g/dL (ref 11.1–15.9)
MCH: 31 pg (ref 26.6–33.0)
MCHC: 32.2 g/dL (ref 31.5–35.7)
MCV: 96 fL (ref 79–97)
Platelets: 232 10*3/uL (ref 150–450)
RBC: 4.07 x10E6/uL (ref 3.77–5.28)
RDW: 12.6 % (ref 11.7–15.4)
WBC: 7.4 10*3/uL (ref 3.4–10.8)

## 2022-11-27 LAB — COMPREHENSIVE METABOLIC PANEL
ALT: 18 IU/L (ref 0–32)
AST: 22 IU/L (ref 0–40)
Albumin: 4.1 g/dL (ref 3.6–4.6)
Alkaline Phosphatase: 86 IU/L (ref 44–121)
BUN/Creatinine Ratio: 26 (ref 12–28)
BUN: 21 mg/dL (ref 10–36)
Bilirubin Total: 0.6 mg/dL (ref 0.0–1.2)
CO2: 27 mmol/L (ref 20–29)
Calcium: 9.6 mg/dL (ref 8.7–10.3)
Chloride: 105 mmol/L (ref 96–106)
Creatinine, Ser: 0.8 mg/dL (ref 0.57–1.00)
Globulin, Total: 2.1 g/dL (ref 1.5–4.5)
Glucose: 85 mg/dL (ref 70–99)
Potassium: 5.1 mmol/L (ref 3.5–5.2)
Sodium: 141 mmol/L (ref 134–144)
Total Protein: 6.2 g/dL (ref 6.0–8.5)
eGFR: 69 mL/min/{1.73_m2} (ref >59)

## 2022-11-27 LAB — TSH: TSH: 8.19 u[IU]/mL — ABNORMAL HIGH (ref 0.450–4.500)

## 2022-11-28 ENCOUNTER — Other Ambulatory Visit: Payer: Self-pay | Admitting: *Deleted

## 2022-11-28 DIAGNOSIS — R7989 Other specified abnormal findings of blood chemistry: Secondary | ICD-10-CM

## 2022-12-10 DIAGNOSIS — M545 Low back pain, unspecified: Secondary | ICD-10-CM | POA: Diagnosis not present

## 2022-12-10 DIAGNOSIS — M5416 Radiculopathy, lumbar region: Secondary | ICD-10-CM | POA: Diagnosis not present

## 2022-12-10 DIAGNOSIS — K080 Exfoliation of teeth due to systemic causes: Secondary | ICD-10-CM | POA: Diagnosis not present

## 2022-12-12 ENCOUNTER — Encounter: Payer: Self-pay | Admitting: *Deleted

## 2023-01-07 DIAGNOSIS — Z1331 Encounter for screening for depression: Secondary | ICD-10-CM | POA: Diagnosis not present

## 2023-01-07 DIAGNOSIS — Z0000 Encounter for general adult medical examination without abnormal findings: Secondary | ICD-10-CM | POA: Diagnosis not present

## 2023-01-07 DIAGNOSIS — I4819 Other persistent atrial fibrillation: Secondary | ICD-10-CM | POA: Diagnosis not present

## 2023-01-07 DIAGNOSIS — R7303 Prediabetes: Secondary | ICD-10-CM | POA: Diagnosis not present

## 2023-01-07 DIAGNOSIS — M19042 Primary osteoarthritis, left hand: Secondary | ICD-10-CM | POA: Diagnosis not present

## 2023-01-07 DIAGNOSIS — Z23 Encounter for immunization: Secondary | ICD-10-CM | POA: Diagnosis not present

## 2023-01-07 DIAGNOSIS — F419 Anxiety disorder, unspecified: Secondary | ICD-10-CM | POA: Diagnosis not present

## 2023-01-07 DIAGNOSIS — E782 Mixed hyperlipidemia: Secondary | ICD-10-CM | POA: Diagnosis not present

## 2023-01-07 DIAGNOSIS — E559 Vitamin D deficiency, unspecified: Secondary | ICD-10-CM | POA: Diagnosis not present

## 2023-01-09 DIAGNOSIS — H524 Presbyopia: Secondary | ICD-10-CM | POA: Diagnosis not present

## 2023-01-29 ENCOUNTER — Telehealth: Payer: Self-pay | Admitting: Cardiology

## 2023-01-29 MED ORDER — APIXABAN 5 MG PO TABS: 5.0000 mg | ORAL_TABLET | Freq: Two times a day (BID) | ORAL | Status: DC

## 2023-01-29 NOTE — Telephone Encounter (Signed)
Patient identification verified by 2 forms. Marilynn Rail, RN    Called and spoke to patient  Informed patient:  -2week sample for Eliquis 5mg   -at this time 5mg  out of stock  -she will be outreached one available for pick up  Patient agrees with plan, no questions at this time

## 2023-01-29 NOTE — Telephone Encounter (Signed)
FWD to pharmacy team  Would this patient be able to receive samples?

## 2023-01-29 NOTE — Telephone Encounter (Signed)
Patient calling the office for samples of medication:   1.  What medication and dosage are you requesting samples for? apixaban (ELIQUIS) 5 MG TABS tablet   2.  Are you currently out of this medication? Yes   Patient is currently in doughnut hole. Says she pays $157/mo for medicine

## 2023-01-29 NOTE — Telephone Encounter (Signed)
Can give 2 weeks so that she only has to pay for it once before the end of the year. Eliquis 5mg  BID

## 2023-02-04 NOTE — Telephone Encounter (Signed)
Patient identification verified by 2 forms. Marilynn Rail, RN   Called and spoke to patient  Informed patient 5mg  Eliquis samples available for pick up at convenience  Patient verbalized understanding, no questions at this time

## 2023-02-06 DIAGNOSIS — M8588 Other specified disorders of bone density and structure, other site: Secondary | ICD-10-CM | POA: Diagnosis not present

## 2023-02-06 DIAGNOSIS — R29890 Loss of height: Secondary | ICD-10-CM | POA: Diagnosis not present

## 2023-02-06 DIAGNOSIS — M810 Age-related osteoporosis without current pathological fracture: Secondary | ICD-10-CM | POA: Diagnosis not present

## 2023-03-18 ENCOUNTER — Encounter: Payer: Self-pay | Admitting: *Deleted

## 2023-03-21 DIAGNOSIS — H31091 Other chorioretinal scars, right eye: Secondary | ICD-10-CM | POA: Diagnosis not present

## 2023-03-21 DIAGNOSIS — H353133 Nonexudative age-related macular degeneration, bilateral, advanced atrophic without subfoveal involvement: Secondary | ICD-10-CM | POA: Diagnosis not present

## 2023-03-21 DIAGNOSIS — H43813 Vitreous degeneration, bilateral: Secondary | ICD-10-CM | POA: Diagnosis not present

## 2023-03-21 DIAGNOSIS — R7989 Other specified abnormal findings of blood chemistry: Secondary | ICD-10-CM | POA: Diagnosis not present

## 2023-03-22 LAB — TSH+FREE T4
Free T4: 1.3 ng/dL (ref 0.82–1.77)
TSH: 5.69 u[IU]/mL — ABNORMAL HIGH (ref 0.450–4.500)

## 2023-03-29 ENCOUNTER — Ambulatory Visit (HOSPITAL_BASED_OUTPATIENT_CLINIC_OR_DEPARTMENT_OTHER): Payer: Medicare Other | Admitting: Pulmonary Disease

## 2023-03-29 ENCOUNTER — Encounter (HOSPITAL_BASED_OUTPATIENT_CLINIC_OR_DEPARTMENT_OTHER): Payer: Self-pay | Admitting: Pulmonary Disease

## 2023-03-29 VITALS — BP 128/68 | HR 72 | Ht 65.0 in | Wt 137.0 lb

## 2023-03-29 DIAGNOSIS — J4550 Severe persistent asthma, uncomplicated: Secondary | ICD-10-CM

## 2023-03-29 MED ORDER — BREZTRI AEROSPHERE 160-9-4.8 MCG/ACT IN AERO: 2.0000 | INHALATION_SPRAY | Freq: Two times a day (BID) | RESPIRATORY_TRACT | 11 refills | Status: AC

## 2023-03-29 NOTE — Progress Notes (Signed)
Subjective:   PATIENT ID: Ann Rodriguez GENDER: female DOB: 1928-05-15, MRN: [id number]   HPI  Chief Complaint  Patient presents with   Asthma    Reason for Visit: Follow-up   Ann Rodriguez is a 89 year old female with kyphoscoliosis, atrial fibrillation, SVT status post ablation 04-16-04, chronic diastolic heart failure, hx TIA, HLD, prior sinus surgery who presents for follow-up  Synopsis: Initially presented for longstanding shortness of breath for over a year. Denies associated chest pain, cough, wheezing or sputum production. Reports her symptoms have limited her ability to clean the house like she used to. PCP and Cardiology work-up has revealed chronic diastolic heart failure otherwise negative for etiology.   04/2020 Since her last visit, her symptoms of dyspnea have remained unchanged.  Occurs with exertion.  Has limited her activities.  She was previously very active participating in water aerobics.  During the pandemic she has not been able to engage as often as she would like.  She did have a recent fall while doing yoga and has modified her movements by using a chair.  Does report benefit after starting this activity.  Denies any associated wheezing.  Does have a dry cough.  Symptoms do not seem different from day or night.  02/19/22 Last visit was >1 year ago. Since our last visit she reports shortness of breath including with simple activities including while combing her hair. Denies any at rest but with any exertion including. Denies cough and wheezing. Has been on Breo for one month but not improved and albuterol. Uses albuterol 3-4 times a day. Taking mucinex for congestion.  04/12/22 Since our last visit Trelegy was tried but she continued to have shortness of breath due to inability to take full dose of inhaler. Continues to have shortness of breath with walking and using her arms for tasks like combing her hair. She recently had a stress test this week that was low risk  but was concerning for new low EF. Plans for repeat echocardiogram. She has been doing chair exercises including upper body exercises.  06/07/22 She was changed from Trelegy to [employer]. She is able to tolerate medication but has unchnaged shortness of breath but feels she is having more sputum production compared to before. Occasional wheezing. Started chair yoga 2-3 times a week. She will get winded on exertion.  09/20/22 Since our last visit she has had an ED visit for possible TIA. Some shortness of breath with activity including short distances. But atrial fib has been better controlled recently on amiodarone. Has not been on her Markus Daft consistently this summer and feels like there is not difference since her heart rate has improved. Denies cough and wheezing. [facility]  03/29/23 Since our last visit she is overall doing well. But reports feeling really breathless with long distances or uphill. A little cough and rarely wheezing. Denies recent infection. Does have some upper congestion.  Social History: Significant exposure from her spouse. He passed with COPD Social smoker. Quit in 04-16-1958. Possibly 3 pack-years at most   Past Medical History:  Diagnosis Date   Arthritis    Bilateral foot-drop 08/31/2020   Depression    PT'S HUSBAND AND SON HAVE DIED W/IN PAST YEAR 2011-04-17   GERD (gastroesophageal reflux disease)    Hearing loss    Hyperlipidemia    Idiopathic peripheral neuropathy 07/18/2016   Nocturnal leg cramps 01/22/2017   Stroke [facility])    SVT (supraventricular tachycardia) (HCC)  09/2014   Thyroid disease    No Known Allergies   Outpatient Medications Prior to Visit  Medication Sig Dispense Refill   acetaminophen (TYLENOL) 500 MG tablet Take 2 tablets (1,000 mg total) by mouth every 6 (six) hours as needed for mild pain or fever.     albuterol (VENTOLIN HFA) 108 (90 Base) MCG/ACT inhaler Inhale 1 puff into the lungs every 4 (four) hours as needed for  shortness of breath or wheezing.     amiodarone (PACERONE) 200 MG tablet Take 1 tablet (200 mg total) by mouth daily. 90 tablet 3   apixaban (ELIQUIS) 5 MG TABS tablet Take 1 tablet (5 mg total) by mouth 2 (two) times daily.     atorvastatin (LIPITOR) 40 MG tablet Take 40 mg by mouth daily.     BIOTIN PO Take 1 tablet by mouth daily.     Budeson-Glycopyrrol-Formoterol (BREZTRI AEROSPHERE) 160-9-4.8 MCG/ACT AERO Inhale 2 puffs into the lungs 2 (two) times daily. 10.7 g 5   cholecalciferol (VITAMIN D) 1000 UNITS tablet Take 1,000 Units by mouth daily.     escitalopram (LEXAPRO) 10 MG tablet Take 10 mg by mouth daily.  0   Multiple Vitamins-Minerals (PRESERVISION AREDS 2 PO) Take 1 capsule by mouth in the morning and at bedtime.     polyethylene glycol (MIRALAX / GLYCOLAX) packet Take 17 g by mouth daily.     traMADol (ULTRAM) 50 MG tablet Take 50 mg by mouth every 12 (twelve) hours as needed for moderate pain.     traZODone (DESYREL) 50 MG tablet Take 75 mg by mouth at bedtime as needed for sleep.     vitamin B-12 (CYANOCOBALAMIN) 100 MCG tablet Take 100 mcg by mouth daily.     furosemide (LASIX) 20 MG tablet Take 1 tablet (20 mg total) by mouth daily as needed. 90 tablet 3   apixaban (ELIQUIS) 5 MG TABS tablet Take 1 tablet (5 mg total) by mouth 2 (two) times daily. 42 tablet 0   HYDROcodone-acetaminophen (NORCO/VICODIN) 5-325 MG tablet Take 1 tablet by mouth every 6 (six) hours as needed for severe pain. Every 6-8 hours     No facility-administered medications prior to visit.    Review of Systems  Constitutional:  Negative for chills, diaphoresis, fever, malaise/fatigue and weight loss.  HENT:  Positive for congestion.   Respiratory:  Positive for shortness of breath. Negative for cough, hemoptysis, sputum production and wheezing.   Cardiovascular:  Negative for chest pain, palpitations and leg swelling.     Objective:   Vitals:   03/29/23 1106  BP: 128/68  Pulse: 72  SpO2: 98%   Weight: 137 lb (62.1 kg)  Height: 5\' 5"  (1.651 m)   SpO2: 98 %  Physical Exam: General: Well-appearing, no acute distress HENT: [place], AT Eyes: EOMI, no scleral icterus Respiratory: Clear to auscultation bilaterally.  No crackles, wheezing or rales Cardiovascular: RRR, -M/R/G, no JVD Extremities:-Edema,-tenderness Neuro: AAO x4, CNII-XII grossly intact Psych: Normal mood, normal affect  Data Reviewed:  Imaging: CTA 02/18/13 - No PE. Background emphysema CXR Ribs 07/25/19 Remote left rib fractures 5-7. Subacute left 10th rib fracture.  CT Chest 02/06/21 - mild scarring in RUL and bilateral lower lobes CXR 10/18/21 - Cardiomegaly. No acute infiltrate, effusion or edema CXR 03/14/22 - Cardiomegaly. Kyphoscoliosis. No acute infiltrate, effusion or edema. CT Chest 07/17/22 - Lung parenchyma with scattered linear scarring. CXR 11/26/22 - No infiltrate effusion or edema  PFT: 10/28/14 FVC 2.81 (109%) FEV1 2.2 (116%) Ratio 70  TLC 95% DLCO 75% Interpretation: Mild obstructive defect with mildly reduced DLCO  04/21/20 FVC 2.1 (90%) FEV1 1.69 (98%) Ratio 74  TLC 84% DLCO 78% Interpretation: No obstruction on spirometry however bronchodilator response significant in FEV1. Normal lung volumes. Reduced gas exchange.  Labs: CBC    Component Value Date/Time   WBC 7.4 11/26/2022 0919   WBC 7.6 08/07/2022 1600   RBC 4.07 11/26/2022 0919   RBC 4.17 08/07/2022 1600   HGB 12.6 11/26/2022 0919   HCT 39.1 11/26/2022 0919   PLT 232 11/26/2022 0919   MCV 96 11/26/2022 0919   MCH 31.0 11/26/2022 0919   MCH 30.2 08/07/2022 1600   MCHC 32.2 11/26/2022 0919   MCHC 31.7 08/07/2022 1600   RDW 12.6 11/26/2022 0919   LYMPHSABS 1.4 08/07/2022 1600   MONOABS 0.8 08/07/2022 1600   EOSABS 0.1 08/07/2022 1600   BASOSABS 0.0 08/07/2022 1600      Assessment & Plan:   Discussion: 89 year old female with remote smoking history who presents for asthma follow-up. Prior PFTs demonstrate obstructive defect and  CTA in 2014 commenting on emphysema. Previously asymptomatic off inhalers but now recurrent on intermittent Breztri use. Discussed clinical course and management of asthma including bronchodilator regimen, preventive care and action plan for exacerbation.  Mild Persistent Asthma - more symptomatic --RESTART Breztri TWO puffs in the morning and evening --CONTINUE Albuterol TWO puffs ONCE a day. This is your as needed --CONTINUE regular exercise. Encourage 10,000 steps a day. Continue upper body exercises with weights.   Kyphoscoliosis Remote rib fractures Deconditioning --Supportive care --Encourage regular activity as tolerated.  OK to do yoga with chair  Health Maintenance Immunization History  Administered Date(s) Administered   Influenza Split 11/20/2007   Influenza, High Dose Seasonal PF 11/24/2009, 11/06/2010, 12/10/2011, 11/17/2012, 12/10/2013, 12/03/2014, 12/12/2015, 11/01/2016, 12/12/2017, 11/20/2018, 11/12/2019   PFIZER(Purple Top)SARS-COV-2 Vaccination 04/02/2019, 04/23/2019, 12/16/2019   Pneumococcal Conjugate-13 05/10/2014   Pneumococcal Polysaccharide-23 03/17/2009   CT Lung Screen - not indicated  No orders of the defined types were placed in this encounter.  Meds ordered this encounter  Medications   Budeson-Glycopyrrol-Formoterol (BREZTRI AEROSPHERE) 160-9-4.8 MCG/ACT AERO    Sig: Inhale 2 puffs into the lungs 2 (two) times daily.    Dispense:  10.7 g    Refill:  11   Return in about 8 months (around 11/27/2023).  I have spent a total time of 35-minutes on the day of the appointment including chart review, data review, collecting history, coordinating care and discussing medical diagnosis and plan with the patient/family. Past medical history, allergies, medications were reviewed. Pertinent imaging, labs and tests included in this note have been reviewed and interpreted independently by me.  Polly Barner Mechele Collin, MD [facility] 03/29/2023 11:14 AM   Office Number [phone]

## 2023-03-29 NOTE — Patient Instructions (Signed)
Mild Persistent Asthma - more symptomatic --RESTART Breztri TWO puffs in the morning and evening --CONTINUE Albuterol TWO puffs ONCE a day. This is your as needed --CONTINUE regular exercise. Encourage 10,000 steps a day. Continue upper body exercises with weights.

## 2023-05-15 NOTE — Progress Notes (Signed)
 HPI: Follow-up atrial fibrillation and chronic diastolic congestive heart failure. Patient previously followed by Dr. Katrinka Blazing. Patient had ablation of AVNRT August 2016. Carotid Dopplers August 2016 showed 1 to 39% bilateral stenosis. Chest CT November 2022 showed coronary calcification. ABIs June 2023 normal bilaterally. Monitor August 2023 showed atrial fibrillation/flutter 8% burden and she was placed on flecainide and apixaban. Nuclear study January 2024 showed no ischemia or infarction and gated ejection fraction 36% but visually appeared better. Monitor January 2024 showed sinus rhythm with occasional PAC, PVC, brief PAT and 3 beats nonsustained ventricular tachycardia. Echocardiogram February 2024 showed normal LV function, grade 1 diastolic dysfunction, mild left atrial enlargement, mild mitral regurgitation, mild aortic insufficiency. Flecainide was ultimately discontinued due to coronary calcification.  CTA May 2024 showed no hemodynamically significant stenosis of the head and neck vasculature.  Patient ultimately placed on amiodarone for her atrial fibrillation.  She was seen with transient aphasia in the emergency room and CTA as described above.  She had transient bradycardia after amiodarone initiated and metoprolol was discontinued.  Since last seen she denies dyspnea, chest pain, palpitations, syncope or bleeding.  Current Outpatient Medications  Medication Sig Dispense Refill   acetaminophen (TYLENOL) 500 MG tablet Take 2 tablets (1,000 mg total) by mouth every 6 (six) hours as needed for mild pain or fever.     albuterol (VENTOLIN HFA) 108 (90 Base) MCG/ACT inhaler Inhale 1 puff into the lungs every 4 (four) hours as needed for shortness of breath or wheezing.     amiodarone (PACERONE) 200 MG tablet Take 1 tablet (200 mg total) by mouth daily. 90 tablet 3   apixaban (ELIQUIS) 5 MG TABS tablet Take 1 tablet (5 mg total) by mouth 2 (two) times daily.     atorvastatin (LIPITOR) 40 MG  tablet Take 40 mg by mouth daily.     BIOTIN PO Take 10 mg by mouth daily.     Budeson-Glycopyrrol-Formoterol (BREZTRI AEROSPHERE) 160-9-4.8 MCG/ACT AERO Inhale 2 puffs into the lungs 2 (two) times daily. 10.7 g 11   cholecalciferol (VITAMIN D) 1000 UNITS tablet Take 1,000 Units by mouth daily.     Cyanocobalamin 1000 MCG SUBL Take 1,000 mcg by mouth daily at 6 (six) AM.     escitalopram (LEXAPRO) 10 MG tablet Take 10 mg by mouth daily.  0   esomeprazole (NEXIUM) 20 MG capsule Take 20 mg by mouth daily at 6 (six) AM.     furosemide (LASIX) 20 MG tablet Take 1 tablet (20 mg total) by mouth daily as needed. 90 tablet 3   Multiple Vitamins-Minerals (PRESERVISION AREDS 2 PO) Take 1 capsule by mouth in the morning and at bedtime.     polyethylene glycol (MIRALAX / GLYCOLAX) packet Take 17 g by mouth daily.     traMADol (ULTRAM) 50 MG tablet Take 50 mg by mouth every 12 (twelve) hours as needed for moderate pain.     traZODone (DESYREL) 50 MG tablet Take 75 mg by mouth at bedtime as needed for sleep.     vitamin B-12 (CYANOCOBALAMIN) 100 MCG tablet Take 100 mcg by mouth daily.     zoledronic acid (RECLAST) 5 MG/100ML SOLN injection Inject 5 mg into the vein. yearly     No current facility-administered medications for this visit.     Past Medical History:  Diagnosis Date   Arthritis    Bilateral foot-drop 08/31/2020   Depression    PT'S HUSBAND AND SON HAVE DIED W/IN PAST YEAR 2011-06-14  GERD (gastroesophageal reflux disease)    Hearing loss    Hyperlipidemia    Idiopathic peripheral neuropathy 07/18/2016   Nocturnal leg cramps 01/22/2017   Stroke [facility])    SVT (supraventricular tachycardia) (HCC) 09/2014   Thyroid disease     Past Surgical History:  Procedure Laterality Date   APPENDECTOMY     BACK SURGERY     lumbar   BLADDER SURGERY     BUNIONECTOMY     left and right with hammer toe repair   CARPAL TUNNEL RELEASE Bilateral 2017   ELECTROPHYSIOLOGIC STUDY N/A 11/03/2014   Procedure:  SVT Ablation;  Surgeon: Marinus Maw, MD;  Location: [facility] INVASIVE CV LAB;  Service: Cardiovascular;  Laterality: N/A;   EYE SURGERY     bilateral cataract extraction with IOL   FOOT SURGERY     KNEE ARTHROSCOPY  02/09/2011   Procedure: ARTHROSCOPY KNEE;  Surgeon: Jacki Cones;  Location: [facility] ORS;  Service: Orthopedics;  Laterality: Left;  Left Knee Arthroscopy with Menisectomy medial, abrasion chondroplasty medial, and synovectomy, supra patella pouch.   NASAL SINUS SURGERY     OOPHORECTOMY     ROTATOR CUFF REPAIR  1/13,  3/13   x 3  right/   SALPINGECTOMY     SHOULDER OPEN ROTATOR CUFF REPAIR Left 06/26/2012   Procedure: LEFT SHOULDER ROTATOR CUFF REPAIR WITH ANCHORS ;  Surgeon: Jacki Cones, MD;  Location: [facility] ORS;  Service: Orthopedics;  Laterality: Left;   TOTAL KNEE ARTHROPLASTY  09/05/2011   Procedure: TOTAL KNEE ARTHROPLASTY;  Surgeon: Jacki Cones, MD;  Location: [facility] OR[facility];  Service: Orthopedics;  Laterality: Left;    Social History   Socioeconomic History   Marital status: Widowed    Spouse name: Not on file   Number of children: 3   Years of education: Not on file   Highest education level: Not on file  Occupational History   Occupation: Retired  Tobacco Use   Smoking status: Former    Current packs/day: 0.00    Average packs/day: 0.5 packs/day for 5.0 years (2.5 ttl pk-yrs)    Types: Cigarettes    Start date: 02/04/1954    Quit date: 02/05/1959    Years since quitting: 64.3   Smokeless tobacco: Never  Substance and Sexual Activity   Alcohol use: Yes    Alcohol/week: 1.0 standard drink of alcohol    Types: 1 Glasses of wine per week    Comment: socially   Drug use: No   Sexual activity: Not on file  Other Topics Concern   Not on file  Social History Narrative   Lives alone   Caffeine use: 4 drinks coffee per day   Right-handed   Social Drivers of Corporate investment banker Strain: Not on file  Food Insecurity: Not on file  Transportation Needs:  Not on file  Physical Activity: Not on file  Stress: Not on file  Social Connections: Not on file  Intimate Partner Violence: Not on file    Family History  Problem Relation Age of Onset   Diabetes Mother    Diabetes Son     ROS: no fevers or chills, productive cough, hemoptysis, dysphasia, odynophagia, melena, hematochezia, dysuria, hematuria, rash, seizure activity, orthopnea, PND, pedal edema, claudication. Remaining systems are negative.  Physical Exam: Well-developed well-nourished in no acute distress.  Skin is warm and dry.  HEENT is normal.  Neck is supple.  Chest is clear to auscultation with normal expansion.  Cardiovascular exam is  regular rate and rhythm.  Abdominal exam nontender or distended. No masses palpated. Extremities show no edema. neuro grossly intact  EKG Interpretation Date/Time:  Tuesday May 28 2023 08:09:19 EDT Ventricular Rate:  58 PR Interval:    QRS Duration:  120 QT Interval:  440 QTC Calculation: 431 R Axis:   10  Text Interpretation: Sinus bradycardia Right bundle branch block When compared with ECG of 26-Nov-2022 08:01, Wide QRS rhythm has replaced Junctional rhythm Confirmed by Olga Millers ([id number]) on 05/28/2023 8:11:05 AM    A/P  1 paroxysmal atrial fibrillation-patient remains in sinus rhythm (note ECG somewhat difficult to assess as P waves difficult to discern).  Continue amiodarone and apixaban.  Note her weight today is 60.3 kg and is therefore borderline for dosing purposes.  I discussed this with she and her daughter today.  Will decrease to 2.5 mg twice daily.  Will check TSH, liver functions and chest x-ray when she returns in 6 months.  2 chronic diastolic congestive heart failure-patient is euvolemic on examination.  She will continue Lasix as needed.  3 hyperlipidemia-continue statin.  4 coronary calcification-she is not having chest pain.  Previous functional study unrevealing.  Continue statin.  She is not on aspirin  given need for apixaban.  5 status post SVT ablation  6 history of chronic dyspnea-possible contribution from scoliosis/restrictive lung disease.  Note LV function normal on previous echocardiogram and she is not volume overloaded on examination.  Previous nuclear study showed no ischemia.  Olga Millers, MD

## 2023-05-28 ENCOUNTER — Encounter: Payer: Self-pay | Admitting: Cardiology

## 2023-05-28 ENCOUNTER — Ambulatory Visit: Payer: Medicare Other | Attending: Cardiology | Admitting: Cardiology

## 2023-05-28 VITALS — BP 116/60 | HR 58 | Ht 65.5 in | Wt 133.0 lb

## 2023-05-28 DIAGNOSIS — I471 Supraventricular tachycardia, unspecified: Secondary | ICD-10-CM

## 2023-05-28 DIAGNOSIS — I2510 Atherosclerotic heart disease of native coronary artery without angina pectoris: Secondary | ICD-10-CM

## 2023-05-28 DIAGNOSIS — I5032 Chronic diastolic (congestive) heart failure: Secondary | ICD-10-CM

## 2023-05-28 DIAGNOSIS — I480 Paroxysmal atrial fibrillation: Secondary | ICD-10-CM | POA: Diagnosis not present

## 2023-05-28 MED ORDER — APIXABAN 2.5 MG PO TABS: 2.5000 mg | ORAL_TABLET | Freq: Two times a day (BID) | ORAL | 3 refills | Status: DC

## 2023-05-28 NOTE — Patient Instructions (Signed)
 Medication Instructions:   DECREASE ELIQUIS TO 2.5 MG TWICE DAILY= 1/2 OF THE 5 MG TABLET TWICE DAILY  *If you need a refill on your cardiac medications before your next appointment, please call your pharmacy*   Follow-Up: At [facility], you and your health needs are our priority.  As part of our continuing mission to provide you with exceptional heart care, we have created designated Provider Care Teams.  These Care Teams include your primary Cardiologist (physician) and Advanced Practice Providers (APPs -  Physician Assistants and Nurse Practitioners) who all work together to provide you with the care you need, when you need it.   Your next appointment:   6 month(s)  Provider:   Olga Millers, MD

## 2023-06-11 DIAGNOSIS — Z87891 Personal history of nicotine dependence: Secondary | ICD-10-CM | POA: Diagnosis not present

## 2023-06-11 DIAGNOSIS — M25512 Pain in left shoulder: Secondary | ICD-10-CM | POA: Diagnosis not present

## 2023-06-11 DIAGNOSIS — R600 Localized edema: Secondary | ICD-10-CM | POA: Diagnosis not present

## 2023-06-21 DIAGNOSIS — M25512 Pain in left shoulder: Secondary | ICD-10-CM | POA: Diagnosis not present

## 2023-06-26 ENCOUNTER — Telehealth: Payer: Self-pay | Admitting: Cardiology

## 2023-06-26 DIAGNOSIS — R609 Edema, unspecified: Secondary | ICD-10-CM

## 2023-06-26 DIAGNOSIS — Z79899 Other long term (current) drug therapy: Secondary | ICD-10-CM

## 2023-06-26 MED ORDER — FUROSEMIDE 40 MG PO TABS
40.0000 mg | ORAL_TABLET | Freq: Every day | ORAL | 3 refills | Status: AC | PRN
Start: 2023-06-26 — End: 2023-11-08

## 2023-06-26 NOTE — Telephone Encounter (Signed)
 [place], Peter M, MD  Cv Div Nl Triage; Lenise Quince, MD; Render Carrie, RNJust now (9:47 AM)    I would increase lasix to 40 mg daily and restrict salt intake. Should have repeat BMET in a week.  Peter [place] MD, [facility]   Patient identification verified by 2 forms. Hilton Lucky, RN    Called and spoke to patient  Relayed provider message  Patient aware:   -updated Rx sent to pharmacy   -to present to lab in 1 week for BMET  Patient verbalized understanding, no questions at this time

## 2023-06-26 NOTE — Telephone Encounter (Signed)
 Pt c/o swelling: STAT is pt has developed SOB within 24 hours  How much weight have you gained and in what time span? 4-5 days  If swelling, where is the swelling located? Feet  Are you currently taking a fluid pill? furosemide (LASIX) 20 MG tablet (Expired) Take 1 tablet (20 mg total) by mouth daily as needed.   Are you currently SOB? No Do you have a log of your daily weights (if so, list)? No  Have you gained 3 pounds in a day or 5 pounds in a week? Maybe 2 lbs this week  Have you traveled recently? Yes, she went to her son's house for the last three weeks

## 2023-06-26 NOTE — Telephone Encounter (Signed)
 Spoke with patient of Dr. Audery Blazing  She reports pedal edema x1 week. She has been taking furosemide 20mg  daily in AM x1 week. There has been no improvement in swelling with diuretic. She notes indention in her feet from shoes. Denies leg pain. She notes her feet are purple but this is not new. She has neuropathy.   She notes shortness of breath, stating that she feels she has to take a deep breath. She also notes she has breathing troubles at baseline.  She was visiting her son for 3 weeks - flew to [place], on airplane for short period of time. She notes her son "is an excellent cook" and she gained 3lbs while visiting and is unsure if it was from his cooking or swelling. She occasionally salts her food.   Daily beverages: 3 cups coffee, water with meals Occasionally consumes frozen meals, canned foods.   Advised will send a message to Dr. Audery Blazing and DOD to review and advise. Last metabolic panel 11/2022.

## 2023-07-09 DIAGNOSIS — E782 Mixed hyperlipidemia: Secondary | ICD-10-CM | POA: Diagnosis not present

## 2023-07-09 DIAGNOSIS — R946 Abnormal results of thyroid function studies: Secondary | ICD-10-CM | POA: Diagnosis not present

## 2023-07-09 DIAGNOSIS — F419 Anxiety disorder, unspecified: Secondary | ICD-10-CM | POA: Diagnosis not present

## 2023-07-09 DIAGNOSIS — R7303 Prediabetes: Secondary | ICD-10-CM | POA: Diagnosis not present

## 2023-07-09 DIAGNOSIS — R7989 Other specified abnormal findings of blood chemistry: Secondary | ICD-10-CM | POA: Diagnosis not present

## 2023-07-09 DIAGNOSIS — G4700 Insomnia, unspecified: Secondary | ICD-10-CM | POA: Diagnosis not present

## 2023-07-23 DIAGNOSIS — K080 Exfoliation of teeth due to systemic causes: Secondary | ICD-10-CM | POA: Diagnosis not present

## 2023-07-26 DIAGNOSIS — R208 Other disturbances of skin sensation: Secondary | ICD-10-CM | POA: Diagnosis not present

## 2023-07-26 DIAGNOSIS — X32XXXD Exposure to sunlight, subsequent encounter: Secondary | ICD-10-CM | POA: Diagnosis not present

## 2023-07-26 DIAGNOSIS — L570 Actinic keratosis: Secondary | ICD-10-CM | POA: Diagnosis not present

## 2023-07-26 DIAGNOSIS — B078 Other viral warts: Secondary | ICD-10-CM | POA: Diagnosis not present

## 2023-09-09 DIAGNOSIS — R946 Abnormal results of thyroid function studies: Secondary | ICD-10-CM | POA: Diagnosis not present

## 2023-09-17 DIAGNOSIS — M79661 Pain in right lower leg: Secondary | ICD-10-CM | POA: Diagnosis not present

## 2023-09-17 DIAGNOSIS — R600 Localized edema: Secondary | ICD-10-CM | POA: Diagnosis not present

## 2023-09-17 DIAGNOSIS — G629 Polyneuropathy, unspecified: Secondary | ICD-10-CM | POA: Diagnosis not present

## 2023-09-17 DIAGNOSIS — I5032 Chronic diastolic (congestive) heart failure: Secondary | ICD-10-CM | POA: Diagnosis not present

## 2023-09-17 DIAGNOSIS — I87393 Chronic venous hypertension (idiopathic) with other complications of bilateral lower extremity: Secondary | ICD-10-CM | POA: Diagnosis not present

## 2023-09-17 DIAGNOSIS — I872 Venous insufficiency (chronic) (peripheral): Secondary | ICD-10-CM | POA: Diagnosis not present

## 2023-09-17 DIAGNOSIS — L819 Disorder of pigmentation, unspecified: Secondary | ICD-10-CM | POA: Diagnosis not present

## 2023-09-17 DIAGNOSIS — M25561 Pain in right knee: Secondary | ICD-10-CM | POA: Diagnosis not present

## 2023-09-17 DIAGNOSIS — M79662 Pain in left lower leg: Secondary | ICD-10-CM | POA: Diagnosis not present

## 2023-09-19 DIAGNOSIS — H353133 Nonexudative age-related macular degeneration, bilateral, advanced atrophic without subfoveal involvement: Secondary | ICD-10-CM | POA: Diagnosis not present

## 2023-09-19 DIAGNOSIS — H31091 Other chorioretinal scars, right eye: Secondary | ICD-10-CM | POA: Diagnosis not present

## 2023-09-19 DIAGNOSIS — H43813 Vitreous degeneration, bilateral: Secondary | ICD-10-CM | POA: Diagnosis not present

## 2023-09-30 DIAGNOSIS — M25561 Pain in right knee: Secondary | ICD-10-CM | POA: Diagnosis not present

## 2023-10-05 ENCOUNTER — Other Ambulatory Visit: Payer: Self-pay | Admitting: Cardiology

## 2023-10-08 ENCOUNTER — Encounter: Admitting: Vascular Surgery

## 2023-10-08 ENCOUNTER — Encounter (HOSPITAL_COMMUNITY)

## 2023-10-11 ENCOUNTER — Other Ambulatory Visit: Payer: Self-pay

## 2023-10-11 DIAGNOSIS — M791 Myalgia, unspecified site: Secondary | ICD-10-CM | POA: Diagnosis not present

## 2023-10-11 DIAGNOSIS — M5416 Radiculopathy, lumbar region: Secondary | ICD-10-CM | POA: Diagnosis not present

## 2023-10-11 DIAGNOSIS — M79673 Pain in unspecified foot: Secondary | ICD-10-CM

## 2023-10-14 ENCOUNTER — Other Ambulatory Visit: Payer: Self-pay | Admitting: *Deleted

## 2023-10-14 DIAGNOSIS — M79606 Pain in leg, unspecified: Secondary | ICD-10-CM

## 2023-10-16 DIAGNOSIS — S8001XA Contusion of right knee, initial encounter: Secondary | ICD-10-CM | POA: Diagnosis not present

## 2023-10-17 DIAGNOSIS — H353113 Nonexudative age-related macular degeneration, right eye, advanced atrophic without subfoveal involvement: Secondary | ICD-10-CM | POA: Diagnosis not present

## 2023-10-28 DIAGNOSIS — M25561 Pain in right knee: Secondary | ICD-10-CM | POA: Diagnosis not present

## 2023-10-28 DIAGNOSIS — S8001XA Contusion of right knee, initial encounter: Secondary | ICD-10-CM | POA: Diagnosis not present

## 2023-10-29 DIAGNOSIS — M5416 Radiculopathy, lumbar region: Secondary | ICD-10-CM | POA: Diagnosis not present

## 2023-10-29 DIAGNOSIS — I700 Atherosclerosis of aorta: Secondary | ICD-10-CM | POA: Diagnosis not present

## 2023-10-29 DIAGNOSIS — D6869 Other thrombophilia: Secondary | ICD-10-CM | POA: Diagnosis not present

## 2023-10-29 DIAGNOSIS — M4135 Thoracogenic scoliosis, thoracolumbar region: Secondary | ICD-10-CM | POA: Diagnosis not present

## 2023-11-07 NOTE — Progress Notes (Unsigned)
 Patient ID: [name], female   DOB: 05-27-1928, 89 y.o.   MRN: [id number]  Reason for Consult: No chief complaint on file.   Referred by Ann Rodriguez  Subjective:     HPI Ann Rodriguez is a 89 y.o. female presenting for evaluation of lower extremity ***swelling and discoloration. ***  Past Medical History:  Diagnosis Date   Arthritis    Bilateral foot-drop 08/31/2020   Depression    PT'S HUSBAND AND SON HAVE DIED W/IN PAST YEAR 11/15/2011   GERD (gastroesophageal reflux disease)    Hearing loss    Hyperlipidemia    Idiopathic peripheral neuropathy 07/18/2016   Nocturnal leg cramps 01/22/2017   Stroke [facility])    SVT (supraventricular tachycardia) (HCC) 09/2014   Thyroid  disease    Family History  Problem Relation Age of Onset   Diabetes Mother    Diabetes Son    Past Surgical History:  Procedure Laterality Date   APPENDECTOMY     BACK SURGERY     lumbar   BLADDER SURGERY     BUNIONECTOMY     left and right with hammer toe repair   CARPAL TUNNEL RELEASE Bilateral 2015/11/15   ELECTROPHYSIOLOGIC STUDY N/A 11/03/2014   Procedure: SVT Ablation;  Surgeon: Ann Rodriguez;  Location: [facility] INVASIVE CV LAB;  Service: Cardiovascular;  Laterality: N/A;   EYE SURGERY     bilateral cataract extraction with IOL   FOOT SURGERY     KNEE ARTHROSCOPY  02/09/2011   Procedure: ARTHROSCOPY KNEE;  Surgeon: Ann Rodriguez;  Location: [facility] OR[facility];  Service: Orthopedics;  Laterality: Left;  Left Knee Arthroscopy with Menisectomy medial, abrasion chondroplasty medial, and synovectomy, supra patella pouch.   NASAL SINUS SURGERY     OOPHORECTOMY     ROTATOR CUFF REPAIR  1/13,  3/13   x 3  right/   SALPINGECTOMY     SHOULDER OPEN ROTATOR CUFF REPAIR Left 06/26/2012   Procedure: LEFT SHOULDER ROTATOR CUFF REPAIR WITH ANCHORS ;  Surgeon: Ann DELENA Heading, Rodriguez;  Location: [facility];  Service: Orthopedics;  Laterality: Left;   TOTAL KNEE ARTHROPLASTY  09/05/2011   Procedure: TOTAL KNEE ARTHROPLASTY;   Surgeon: Ann DELENA Heading, Rodriguez;  Location: [facility];  Service: Orthopedics;  Laterality: Left;    Short Social History:  Social History   Tobacco Use   Smoking status: Former    Current packs/day: 0.00    Average packs/day: 0.5 packs/day for 5.0 years (2.5 ttl pk-yrs)    Types: Cigarettes    Start date: 02/04/1954    Quit date: 02/05/1959    Years since quitting: 64.7   Smokeless tobacco: Never  Substance Use Topics   Alcohol use: Yes    Alcohol/week: 1.0 standard drink of alcohol    Types: 1 Glasses of wine per week    Comment: socially    No Known Allergies  Current Outpatient Medications  Medication Sig Dispense Refill   acetaminophen  (TYLENOL ) 500 MG tablet Take 2 tablets (1,000 mg total) by mouth every 6 (six) hours as needed for mild pain or fever.     albuterol  (VENTOLIN  HFA) 108 (90 Base) MCG/ACT inhaler Inhale 1 puff into the lungs every 4 (four) hours as needed for shortness of breath or wheezing.     amiodarone  (PACERONE ) 200 MG tablet Take 1 tablet by mouth once daily 90 tablet 3   apixaban  (ELIQUIS ) 2.5 MG TABS tablet Take 1 tablet (2.5 mg total) by mouth 2 (two) times  daily. 180 tablet 3   atorvastatin  (LIPITOR) 40 MG tablet Take 40 mg by mouth daily.     BIOTIN PO Take 10 mg by mouth daily.     Budeson-Glycopyrrol-Formoterol (BREZTRI  AEROSPHERE) 160-9-4.8 MCG/ACT AERO Inhale 2 puffs into the lungs 2 (two) times daily. 10.7 g 11   cholecalciferol (VITAMIN D) 1000 UNITS tablet Take 1,000 Units by mouth daily.     Cyanocobalamin  1000 MCG SUBL Take 1,000 mcg by mouth daily at 6 (six) AM.     escitalopram  (LEXAPRO ) 10 MG tablet Take 10 mg by mouth daily.  0   esomeprazole (NEXIUM) 20 MG capsule Take 20 mg by mouth daily at 6 (six) AM.     furosemide  (LASIX ) 40 MG tablet Take 1 tablet (40 mg total) by mouth daily as needed. 90 tablet 3   Multiple Vitamins-Minerals (PRESERVISION AREDS 2 PO) Take 1 capsule by mouth in the morning and at bedtime.     polyethylene glycol  (MIRALAX  / GLYCOLAX ) packet Take 17 g by mouth daily.     traMADol  (ULTRAM ) 50 MG tablet Take 50 mg by mouth every 12 (twelve) hours as needed for moderate pain.     traZODone  (DESYREL ) 50 MG tablet Take 75 mg by mouth at bedtime as needed for sleep.     vitamin B-12 (CYANOCOBALAMIN ) 100 MCG tablet Take 100 mcg by mouth daily.     zoledronic  acid (RECLAST ) 5 MG/100ML SOLN injection Inject 5 mg into the vein. yearly     No current facility-administered medications for this visit.    REVIEW OF SYSTEMS  All other systems were reviewed and are negative     Objective:  Objective   There were no vitals filed for this visit. There is no height or weight on file to calculate BMI.  Physical Exam General: no acute distress Cardiac: hemodynamically stable Pulm: normal work of breathing Abdomen: non-tender, no pulsatile mass*** Neuro: alert, no focal deficit Extremities: no edema, cyanosis or wounds*** Vascular:   Right: ***  Left: ***  Data: ABI ***  Lipid panel reviewed  CMP reviewed, creatinine 0.8     Assessment/Plan:   Ann Rodriguez is a 89 y.o. female with ***  Recommendations to optimize cardiovascular risk: Abstinence from all tobacco products. Blood glucose control with goal A1c < 7%. Blood pressure control with goal blood pressure < 140/90 mmHg. Lipid reduction therapy with goal LDL-C <100 mg/dL  Aspirin  81mg  PO QD.  Atorvastatin  40-80mg  PO QD (or other high intensity statin therapy).   Ann Rodriguez [facility]

## 2023-11-08 ENCOUNTER — Ambulatory Visit (HOSPITAL_COMMUNITY)
Admission: RE | Admit: 2023-11-08 | Discharge: 2023-11-08 | Disposition: A | Discharge status: Home / Self Care | Source: Ambulatory Visit | Attending: Vascular Surgery | Admitting: Vascular Surgery

## 2023-11-08 ENCOUNTER — Encounter: Payer: Self-pay | Admitting: Vascular Surgery

## 2023-11-08 ENCOUNTER — Encounter: Admitting: Vascular Surgery

## 2023-11-08 ENCOUNTER — Encounter (HOSPITAL_COMMUNITY)

## 2023-11-08 ENCOUNTER — Ambulatory Visit: Attending: Vascular Surgery | Admitting: Vascular Surgery

## 2023-11-08 VITALS — BP 166/57 | HR 55 | Temp 98.4°F | Ht 65.0 in | Wt 127.0 lb

## 2023-11-08 DIAGNOSIS — M79606 Pain in leg, unspecified: Secondary | ICD-10-CM | POA: Diagnosis not present

## 2023-11-08 DIAGNOSIS — I872 Venous insufficiency (chronic) (peripheral): Secondary | ICD-10-CM | POA: Diagnosis not present

## 2023-11-09 LAB — VAS US ABI WITH/WO TBI
Left ABI: 1.09
Right ABI: 1.24

## 2023-11-18 DIAGNOSIS — H9201 Otalgia, right ear: Secondary | ICD-10-CM | POA: Diagnosis not present

## 2023-11-18 DIAGNOSIS — M26629 Arthralgia of temporomandibular joint, unspecified side: Secondary | ICD-10-CM | POA: Diagnosis not present

## 2023-11-18 DIAGNOSIS — H6121 Impacted cerumen, right ear: Secondary | ICD-10-CM | POA: Diagnosis not present

## 2023-11-22 ENCOUNTER — Encounter: Payer: Self-pay | Admitting: Cardiology

## 2023-11-22 DIAGNOSIS — H31091 Other chorioretinal scars, right eye: Secondary | ICD-10-CM | POA: Diagnosis not present

## 2023-11-22 DIAGNOSIS — H43813 Vitreous degeneration, bilateral: Secondary | ICD-10-CM | POA: Diagnosis not present

## 2023-11-22 DIAGNOSIS — H353113 Nonexudative age-related macular degeneration, right eye, advanced atrophic without subfoveal involvement: Secondary | ICD-10-CM | POA: Diagnosis not present

## 2023-12-03 DIAGNOSIS — D6869 Other thrombophilia: Secondary | ICD-10-CM | POA: Diagnosis not present

## 2023-12-03 DIAGNOSIS — M5416 Radiculopathy, lumbar region: Secondary | ICD-10-CM | POA: Diagnosis not present

## 2024-01-25 ENCOUNTER — Other Ambulatory Visit: Payer: Self-pay | Admitting: Cardiology

## 2024-01-25 DIAGNOSIS — Z79899 Other long term (current) drug therapy: Secondary | ICD-10-CM

## 2024-01-25 DIAGNOSIS — I4891 Unspecified atrial fibrillation: Secondary | ICD-10-CM

## 2024-01-29 DIAGNOSIS — H353133 Nonexudative age-related macular degeneration, bilateral, advanced atrophic without subfoveal involvement: Secondary | ICD-10-CM | POA: Diagnosis not present

## 2024-01-29 DIAGNOSIS — H31091 Other chorioretinal scars, right eye: Secondary | ICD-10-CM | POA: Diagnosis not present

## 2024-01-29 DIAGNOSIS — H43813 Vitreous degeneration, bilateral: Secondary | ICD-10-CM | POA: Diagnosis not present

## 2024-01-29 NOTE — Telephone Encounter (Signed)
 Spoke with pt.  She is aware that we need a recent BMET in order to fill her Eliquis .  Pt will come to the lab tomorrow and is aware that it is on the 1st floor.  Will pend the Eliquis  refill until we get lab results.

## 2024-01-30 DIAGNOSIS — Z79899 Other long term (current) drug therapy: Secondary | ICD-10-CM | POA: Diagnosis not present

## 2024-01-30 DIAGNOSIS — I4891 Unspecified atrial fibrillation: Secondary | ICD-10-CM | POA: Diagnosis not present

## 2024-01-31 ENCOUNTER — Ambulatory Visit: Payer: Self-pay | Admitting: Cardiology

## 2024-01-31 DIAGNOSIS — I4891 Unspecified atrial fibrillation: Secondary | ICD-10-CM | POA: Insufficient documentation

## 2024-01-31 DIAGNOSIS — I471 Supraventricular tachycardia, unspecified: Secondary | ICD-10-CM | POA: Insufficient documentation

## 2024-01-31 LAB — BASIC METABOLIC PANEL WITH GFR
BUN/Creatinine Ratio: 23 (ref 12–28)
BUN: 19 mg/dL (ref 10–36)
CO2: 27 mmol/L (ref 20–29)
Calcium: 9.9 mg/dL (ref 8.7–10.3)
Chloride: 104 mmol/L (ref 96–106)
Creatinine, Ser: 0.82 mg/dL (ref 0.57–1.00)
Glucose: 95 mg/dL (ref 70–99)
Potassium: 4.6 mmol/L (ref 3.5–5.2)
Sodium: 141 mmol/L (ref 134–144)
eGFR: 67 mL/min/1.73 (ref >59)

## 2024-02-01 DIAGNOSIS — M5416 Radiculopathy, lumbar region: Secondary | ICD-10-CM | POA: Diagnosis not present

## 2024-02-18 DIAGNOSIS — E782 Mixed hyperlipidemia: Secondary | ICD-10-CM | POA: Diagnosis not present

## 2024-02-18 DIAGNOSIS — F419 Anxiety disorder, unspecified: Secondary | ICD-10-CM | POA: Diagnosis not present

## 2024-02-18 DIAGNOSIS — Z0000 Encounter for general adult medical examination without abnormal findings: Secondary | ICD-10-CM | POA: Diagnosis not present

## 2024-02-18 DIAGNOSIS — R7303 Prediabetes: Secondary | ICD-10-CM | POA: Diagnosis not present

## 2024-02-18 DIAGNOSIS — E039 Hypothyroidism, unspecified: Secondary | ICD-10-CM | POA: Diagnosis not present

## 2024-02-18 DIAGNOSIS — G4700 Insomnia, unspecified: Secondary | ICD-10-CM | POA: Diagnosis not present

## 2024-02-18 DIAGNOSIS — E559 Vitamin D deficiency, unspecified: Secondary | ICD-10-CM | POA: Diagnosis not present

## 2024-02-18 DIAGNOSIS — Z1331 Encounter for screening for depression: Secondary | ICD-10-CM | POA: Diagnosis not present
